# Patient Record
Sex: Female | Born: 1940
Health system: Southern US, Community
[De-identification: ages and names within clinical notes are randomized; demographics above are authoritative.]

## PROBLEM LIST (undated history)

## (undated) DIAGNOSIS — M8580 Other specified disorders of bone density and structure, unspecified site: Secondary | ICD-10-CM

## (undated) DIAGNOSIS — I83893 Varicose veins of bilateral lower extremities with other complications: Secondary | ICD-10-CM

## (undated) DIAGNOSIS — F419 Anxiety disorder, unspecified: Secondary | ICD-10-CM

## (undated) DIAGNOSIS — I2699 Other pulmonary embolism without acute cor pulmonale: Secondary | ICD-10-CM

## (undated) DIAGNOSIS — M47817 Spondylosis without myelopathy or radiculopathy, lumbosacral region: Secondary | ICD-10-CM

## (undated) DIAGNOSIS — K219 Gastro-esophageal reflux disease without esophagitis: Secondary | ICD-10-CM

## (undated) DIAGNOSIS — I4891 Unspecified atrial fibrillation: Secondary | ICD-10-CM

## (undated) DIAGNOSIS — I340 Nonrheumatic mitral (valve) insufficiency: Secondary | ICD-10-CM

## (undated) DIAGNOSIS — M4310 Spondylolisthesis, site unspecified: Secondary | ICD-10-CM

## (undated) DIAGNOSIS — R600 Localized edema: Secondary | ICD-10-CM

## (undated) DIAGNOSIS — I471 Supraventricular tachycardia: Secondary | ICD-10-CM

## (undated) DIAGNOSIS — E559 Vitamin D deficiency, unspecified: Secondary | ICD-10-CM

## (undated) DIAGNOSIS — Z9889 Other specified postprocedural states: Secondary | ICD-10-CM

## (undated) DIAGNOSIS — I10 Essential (primary) hypertension: Secondary | ICD-10-CM

## (undated) DIAGNOSIS — I4719 Other supraventricular tachycardia: Secondary | ICD-10-CM

## (undated) DIAGNOSIS — Z8619 Personal history of other infectious and parasitic diseases: Secondary | ICD-10-CM

## (undated) DIAGNOSIS — R112 Nausea with vomiting, unspecified: Secondary | ICD-10-CM

## (undated) DIAGNOSIS — J8410 Pulmonary fibrosis, unspecified: Secondary | ICD-10-CM

## (undated) DIAGNOSIS — I499 Cardiac arrhythmia, unspecified: Secondary | ICD-10-CM

## (undated) DIAGNOSIS — E785 Hyperlipidemia, unspecified: Secondary | ICD-10-CM

## (undated) HISTORY — DX: Localized edema: R60.0

## (undated) HISTORY — DX: Hyperlipidemia, unspecified: E78.5

## (undated) HISTORY — DX: Personal history of other infectious and parasitic diseases: Z86.19

## (undated) HISTORY — DX: Other specified disorders of bone density and structure, unspecified site: M85.80

## (undated) HISTORY — DX: Vitamin D deficiency, unspecified: E55.9

## (undated) HISTORY — PX: DILATION AND CURETTAGE OF UTERUS: SHX78

## (undated) HISTORY — DX: Spondylolisthesis, site unspecified: M43.10

## (undated) HISTORY — DX: Gastro-esophageal reflux disease without esophagitis: K21.9

## (undated) HISTORY — DX: Supraventricular tachycardia: I47.1

## (undated) HISTORY — DX: Spondylosis without myelopathy or radiculopathy, lumbosacral region: M47.817

## (undated) HISTORY — PX: TOE SURGERY: SHX1073

## (undated) HISTORY — DX: Varicose veins of bilateral lower extremities with other complications: I83.893

## (undated) HISTORY — DX: Pulmonary fibrosis, unspecified: J84.10

## (undated) HISTORY — PX: BACK SURGERY: SHX140

## (undated) HISTORY — DX: Other supraventricular tachycardia: I47.19

## (undated) HISTORY — PX: TONSILLECTOMY: SUR1361

## (undated) HISTORY — DX: Essential (primary) hypertension: I10

---

## 1974-06-20 HISTORY — PX: TUBAL LIGATION: SHX77

## 1993-06-20 HISTORY — PX: NECK SURGERY: SHX720

## 1997-06-20 HISTORY — PX: COMBINED HYSTEROSCOPY DIAGNOSTIC / D&C: SUR297

## 1997-08-18 ENCOUNTER — Ambulatory Visit (HOSPITAL_COMMUNITY): Admission: RE | Admit: 1997-08-18 | Discharge: 1997-08-18 | Payer: Self-pay | Admitting: Obstetrics and Gynecology

## 1998-07-07 ENCOUNTER — Other Ambulatory Visit: Admission: RE | Admit: 1998-07-07 | Discharge: 1998-07-07 | Payer: Self-pay | Admitting: Obstetrics and Gynecology

## 1999-10-19 ENCOUNTER — Other Ambulatory Visit: Admission: RE | Admit: 1999-10-19 | Discharge: 1999-10-19 | Payer: Self-pay | Admitting: Obstetrics and Gynecology

## 2000-10-21 ENCOUNTER — Inpatient Hospital Stay (HOSPITAL_COMMUNITY): Admission: EM | Admit: 2000-10-21 | Discharge: 2000-10-22 | Payer: Self-pay | Admitting: Emergency Medicine

## 2000-10-21 ENCOUNTER — Encounter: Payer: Self-pay | Admitting: Emergency Medicine

## 2000-10-22 ENCOUNTER — Encounter: Payer: Self-pay | Admitting: *Deleted

## 2000-11-24 ENCOUNTER — Emergency Department (HOSPITAL_COMMUNITY): Admission: EM | Admit: 2000-11-24 | Discharge: 2000-11-24 | Payer: Self-pay | Admitting: *Deleted

## 2001-01-08 ENCOUNTER — Other Ambulatory Visit: Admission: RE | Admit: 2001-01-08 | Discharge: 2001-01-08 | Payer: Self-pay | Admitting: Obstetrics and Gynecology

## 2001-07-03 ENCOUNTER — Encounter: Payer: Self-pay | Admitting: Obstetrics and Gynecology

## 2001-07-03 ENCOUNTER — Ambulatory Visit (HOSPITAL_COMMUNITY): Admission: RE | Admit: 2001-07-03 | Discharge: 2001-07-03 | Payer: Self-pay | Admitting: Obstetrics and Gynecology

## 2001-09-24 ENCOUNTER — Encounter: Payer: Self-pay | Admitting: Surgery

## 2001-09-24 ENCOUNTER — Ambulatory Visit (HOSPITAL_COMMUNITY): Admission: RE | Admit: 2001-09-24 | Discharge: 2001-09-24 | Payer: Self-pay | Admitting: Surgery

## 2002-01-31 ENCOUNTER — Other Ambulatory Visit: Admission: RE | Admit: 2002-01-31 | Discharge: 2002-01-31 | Payer: Self-pay | Admitting: Obstetrics and Gynecology

## 2002-04-23 ENCOUNTER — Ambulatory Visit (HOSPITAL_COMMUNITY): Admission: RE | Admit: 2002-04-23 | Discharge: 2002-04-23 | Payer: Self-pay | Admitting: Surgery

## 2002-04-23 ENCOUNTER — Encounter: Payer: Self-pay | Admitting: Surgery

## 2002-06-20 DIAGNOSIS — J8410 Pulmonary fibrosis, unspecified: Secondary | ICD-10-CM

## 2002-06-20 HISTORY — DX: Pulmonary fibrosis, unspecified: J84.10

## 2003-02-20 ENCOUNTER — Other Ambulatory Visit: Admission: RE | Admit: 2003-02-20 | Discharge: 2003-02-20 | Payer: Self-pay | Admitting: Obstetrics and Gynecology

## 2003-02-21 ENCOUNTER — Ambulatory Visit (HOSPITAL_COMMUNITY): Admission: RE | Admit: 2003-02-21 | Discharge: 2003-02-21 | Payer: Self-pay | Admitting: Internal Medicine

## 2003-05-26 ENCOUNTER — Encounter: Admission: RE | Admit: 2003-05-26 | Discharge: 2003-05-26 | Payer: Self-pay | Admitting: Internal Medicine

## 2003-06-21 HISTORY — PX: COMBINED HYSTEROSCOPY DIAGNOSTIC / D&C: SUR297

## 2004-03-17 ENCOUNTER — Other Ambulatory Visit: Admission: RE | Admit: 2004-03-17 | Discharge: 2004-03-17 | Payer: Self-pay | Admitting: Obstetrics and Gynecology

## 2004-07-01 ENCOUNTER — Other Ambulatory Visit: Admission: RE | Admit: 2004-07-01 | Discharge: 2004-07-01 | Payer: Self-pay | Admitting: Obstetrics and Gynecology

## 2005-03-22 ENCOUNTER — Other Ambulatory Visit: Admission: RE | Admit: 2005-03-22 | Discharge: 2005-03-22 | Payer: Self-pay | Admitting: Obstetrics and Gynecology

## 2006-03-02 ENCOUNTER — Encounter: Admission: RE | Admit: 2006-03-02 | Discharge: 2006-03-02 | Payer: Self-pay | Admitting: Internal Medicine

## 2008-01-16 ENCOUNTER — Ambulatory Visit (HOSPITAL_COMMUNITY): Admission: RE | Admit: 2008-01-16 | Discharge: 2008-01-16 | Payer: Self-pay | Admitting: Obstetrics and Gynecology

## 2008-01-16 ENCOUNTER — Encounter (INDEPENDENT_AMBULATORY_CARE_PROVIDER_SITE_OTHER): Payer: Self-pay | Admitting: Obstetrics and Gynecology

## 2009-02-06 ENCOUNTER — Encounter: Admission: RE | Admit: 2009-02-06 | Discharge: 2009-02-06 | Payer: Self-pay | Admitting: Orthopaedic Surgery

## 2009-02-10 ENCOUNTER — Ambulatory Visit (HOSPITAL_BASED_OUTPATIENT_CLINIC_OR_DEPARTMENT_OTHER): Admission: RE | Admit: 2009-02-10 | Discharge: 2009-02-10 | Payer: Self-pay | Admitting: Orthopaedic Surgery

## 2010-09-20 ENCOUNTER — Other Ambulatory Visit: Payer: Self-pay | Admitting: Internal Medicine

## 2010-09-20 DIAGNOSIS — R1013 Epigastric pain: Secondary | ICD-10-CM

## 2010-09-21 ENCOUNTER — Ambulatory Visit
Admission: RE | Admit: 2010-09-21 | Discharge: 2010-09-21 | Disposition: A | Discharge status: Home / Self Care | Payer: Medicare Other | Source: Ambulatory Visit | Attending: Internal Medicine | Admitting: Internal Medicine

## 2010-09-21 DIAGNOSIS — R1013 Epigastric pain: Secondary | ICD-10-CM

## 2010-09-22 ENCOUNTER — Other Ambulatory Visit: Payer: Self-pay

## 2010-09-25 LAB — BASIC METABOLIC PANEL
BUN: 20 mg/dL (ref 6–23)
CO2: 29 mEq/L (ref 19–32)
Calcium: 9.2 mg/dL (ref 8.4–10.5)
Chloride: 106 mEq/L (ref 96–112)
Creatinine, Ser: 0.84 mg/dL (ref 0.4–1.2)
GFR calc Af Amer: >60 mL/min (ref >60)
GFR calc non Af Amer: >60 mL/min (ref >60)
Glucose, Bld: 89 mg/dL (ref 70–99)
Potassium: 4.7 mEq/L (ref 3.5–5.1)
Sodium: 138 mEq/L (ref 135–145)

## 2010-09-25 LAB — HEPATIC FUNCTION PANEL
ALT: 10 U/L (ref 0–35)
AST: 16 U/L (ref 0–37)
Albumin: 3.9 g/dL (ref 3.5–5.2)
Alkaline Phosphatase: 42 U/L (ref 39–117)
Bilirubin, Direct: 0.2 mg/dL (ref 0.0–0.3)
Indirect Bilirubin: 1 mg/dL — ABNORMAL HIGH (ref 0.3–0.9)
Total Bilirubin: 1.2 mg/dL (ref 0.3–1.2)
Total Protein: 6.9 g/dL (ref 6.0–8.3)

## 2010-09-25 LAB — POCT HEMOGLOBIN-HEMACUE: Hemoglobin: 14 g/dL (ref 12.0–15.0)
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
## 2010-11-02 NOTE — Op Note (Signed)
Christy [name], Christy [name]                ACCOUNT NO.:  [id number]   MEDICAL RECORD NO.:  [id number]          PATIENT TYPE:  AMB   LOCATION:  DSC                          FACILITY:  [facility]   PHYSICIAN:  Lubertha Basque. Dalldorf, M.D.DATE OF BIRTH:  Oct 20, 1940   DATE OF PROCEDURE:  02/10/2009  DATE OF DISCHARGE:                               OPERATIVE REPORT   PREOPERATIVE DIAGNOSIS:  Right first metatarsophalangeal degeneration.   POSTOPERATIVE DIAGNOSIS:  Right first metatarsophalangeal degeneration.   PROCEDURE:  Right first metatarsophalangeal replacement.   ANESTHESIA:  General.   ATTENDING SURGEON:  Lubertha Basque. Jerl Santos, MD   ASSISTANT:  Lindwood Qua, PA   INDICATIONS FOR PROCEDURE:  The patient is a 70 year old woman with a  many year history of a painful right great toe.  This has persisted  despite oral anti-inflammatories and shoe modifications and 3  intraarticular injections, the first of which gave her many months of  relief.  She has pain which limits her ability to walk in a normal  fashion and to wear appropriate shoes and she is offered a replacement.  Informed operative consent was obtained after discussion of possible  complications including reaction to anesthesia, infection, and failure  of implant.   SUMMARY, FINDINGS, AND PROCEDURE:  Under general anesthesia through a  dorsal incision, a first MTP replacement was done on the right foot.  She had advanced degenerative change.  We performed the replacement with  a size 4S [facility] implant.  Lindwood Qua [facility] assisted  throughout and was invaluable to the completion of the case in that he  helped retract and position while I performed the procedure.   DESCRIPTION OF PROCEDURE:  The patient was taken to the operating suite  where general anesthetic was applied without difficulty.  She was  positioned supine and prepped and draped in a normal sterile fashion.  After administration of IV Kefzol, the right leg was  elevated,  exsanguinated, and tourniquet inflated about the calf.  A dorsal  incision was made with dissection down to the capsule over the joint.  An incision was made in the capsule just medial to the EHL tendon.  The  MTP joint was exposed.  Some large osteophytes were removed with rongeur  and saw and bur.  We made a cut on the distal portion of the first  metatarsal and roughly neutral angulation.  I made a corresponding cut  at the base of the proximal phalanx.  We then revised our first cut into  slight valgus to give [place] appropriate alignment.  We then entered the  intramedullary canal with a bur on each bone and rasped appropriately to  size 4.  We placed a trial component that seemed to give [place] a nice  reduction with no overhang on either aspect of the joint.  The trial  component was removed followed by lavage of the wound and bones.  We  then placed a size 4S [facility] implant.  Again, this seemed to fit  very well and was not under undue tension.  The tourniquet was deflated  and the toe  became pink and warm immediately.  The wound was irrigated  followed by reapproximation of the capsule with Vicryl and skin with  nylon.  Some Marcaine was injected followed by dry gauze and loose Ace  wrap.  Estimated blood loss and intraoperative fluids as well as  accurate tourniquet time can be obtained from anesthesia records.   DISPOSITION:  The patient was extubated in the operating room and taken  to the recovery room in stable addition.  She was to go home same-day  and follow up in my the office closely.  I will contact her by phone  tonight.      Lubertha Basque Jerl Santos, M.D.  Electronically Signed     PGD/MEDQ  D:  02/10/2009  T:  02/11/2009  Job:  [id number]

## 2010-11-02 NOTE — Op Note (Signed)
Christy [name], Christy [name]                ACCOUNT NO.:  [id number]   MEDICAL RECORD NO.:  [id number]          PATIENT TYPE:  AMB   LOCATION:  SDC                           [facility]ILITY[facility]   PHYSICIAN:  Guy Sandifer. Henderson Cloud, M.D. DATE OF BIRTH:  1940/12/16   DATE OF PROCEDURE:  01/16/2008  DATE OF DISCHARGE:                               OPERATIVE REPORT   PREOPERATIVE DIAGNOSIS:  Postmenopausal bleeding.   POSTOPERATIVE DIAGNOSIS:  Postmenopausal bleeding.   PROCEDURE:  Hysteroscopy with resection of endometrial polyp,  dilatation, and curettage.   SURGEON:  Guy Sandifer. Henderson Cloud, MD   ANESTHESIA:  General with LMA.   SPECIMENS:  1. Endometrial polyp.  2. Endometrial curettings.  All to pathology.  I's and O's of sorbitol      distending media 80 mL deficit with some of that on the floor.   INDICATIONS AND CONSENT:  This patient is a 70 year old married white  female G2, P2 with postmenopausal bleeding.  Details dictated in history  and physical.  Hysteroscopy with resectoscope, dilatation, and curettage  has been discussed with the patient preoperatively.  Potential risks and  complications were discussed preoperatively including but limited to  infection, uterine perforation, organ damage, bleeding requiring  transfusion of blood products with possible HIV and hepatitis  acquisition, DVT, PE, pneumonia, laparotomy, and laparoscopy.  All  questions have been answered and consent is signed on the chart.   FINDINGS:  There is approximately 8-mm broad-based polypoid-type mass in  the center of the posterior upper endometrial canal.  Fallopian tube  ostia identified bilaterally and the remainder of the endometrium  appears atrophic without lesion.   PROCEDURE:  The patient was taken to the operating room where she was  identified, placed in a dorsal supine position and general anesthesia  was induced via LMA.  She was then placed in a dorsal lithotomy  position.  She was prepped, bladder  straight catheterized, and draped in  a sterile fashion.  Bivalve speculum was placed in the vagina and the  anterior cervical lip was injected with 1% plain Xylocaine.  It was then  grasped with a single-tooth tenaculum.  Paracervical block was placed in  a 2, 4, 5, 7, 8, and 10 o'clock positions with approximately 13 mL of  the same solution.  Cervix was gently progressively dilated to a 31  dilator.  A 5-mm resectoscope with a single right-angle wire loop was  then placed in the endocervical canal and advanced under direct  visualization using sorbitol  distending media.  The above finding was noted.  The polyp was resected  in a simple fashion.  Good hemostasis was maintained.  Specimen was  removed.  Sharp curettage was carried out for scant amount of tissue.  There was good hemostasis.  All counts were correct.  The patient was  awakened and taken to the recovery room in stable condition.      Guy Sandifer Henderson Cloud, M.D.  Electronically Signed     JET/MEDQ  D:  01/16/2008  T:  01/17/2008  Job:  [id number]

## 2010-11-05 NOTE — Op Note (Signed)
NAME:  Christy [name], WIGGLESWORTH                         ACCOUNT NO.:  [id number]   MEDICAL RECORD NO.:  [id number]                   PATIENT TYPE:  AMB   LOCATION:  DAY                                  FACILITY:  [facility]   PHYSICIAN:  Lionel December, M.D.                 DATE OF BIRTH:  1941-01-19   DATE OF PROCEDURE:  02/21/2003  DATE OF DISCHARGE:                                 OPERATIVE REPORT   PROCEDURE:  Total colonoscopy with polypectomy.   INDICATIONS FOR PROCEDURE:  [name] is a 70 year old Caucasian female who is  undergoing screening colonoscopy.  The procedure was reviewed with the  patient, and informed consent was obtained.   PREOPERATIVE MEDICATIONS:  Demerol 50 mg IV, Versed 5 mg IV.   FINDINGS:  The procedure was performed in the endoscopy suite.  The  patient's vital signs and O2 saturations were monitored during the procedure  and remained stable.  The patient was placed in the left lateral recumbent  position and rectal examination performed.  No abnormality noted on external  or digital exam.  The Olympus videoscope was placed into the rectum and  advanced into the region of the sigmoid colon and beyond.  The preparation  was excellent.  The scope was passed to the cecum which was identified by  the appendiceal orifice and ileocecal valve.  There was about a 12 to 13-mm  flat polyp next to the ileocecal valve, and there was a much smaller polyp  next to it.  The larger polyp was raised with a submucosal injection of  normal saline.  It was snared piecemeal.  The smaller polyp was also snared,  and both of these polyps were submitted in one container.  There was  injection of a small amount of saline into the mucosa.  The mucosa of the  rest of the colon was normal.  There was a 6-cm polyp at the rectum which  was snared and retrieved for histologic examination.  The scope was  retroflexed to examine the anorectal junction, and moderate-sized  hemorrhoids were noted below the  dentate line.  The endoscope was  straightened and withdrawn.  The patient tolerated the procedure well.   FINAL DIAGNOSES:  1. Examination performed to the cecum.  2. A 12 to 13-mm flat polyp snared from the cecum after raising with a     submucosal injection of normal saline.  Another smaller polyp right next     to it was also snared along with a rectal polyp that was snared.  3. Moderate-sized external hemorrhoids.    RECOMMENDATIONS:  1. Standard instructions given.  2. I will be contacting [name] with the biopsy results next week.  Lionel December, M.D.    NR/MEDQ  D:  02/21/2003  T:  02/21/2003  Job:  [id number]   cc:   Guy Sandifer. Arleta Creek, M.D.  [address]  [place]  [place]  [place] [address]  Fax: [phone]   Eula Listen, M.D.  [facility]
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
## 2010-11-05 NOTE — Discharge Summary (Signed)
[place]  Patient:    Christy [name], Christy [name]                      MRN: [id number] Adm. Date:  [id number] Disc. Date: [id number] Attending:  Daisey Must Dictator:   Brita Romp, P.A. CC:         Luciana Axe, M.D.  Daisey Must, M.D. [facility]   Discharge Summary  DISCHARGE DIAGNOSES: 1. Chest pain, status post negative GXT Cardiolite stress test. 2. History of paroxysmal atrial tachycardia. 3. Gastroesophageal reflux disease. 4. History of hepatitis C.  HOSPITAL COURSE:  Christy [name] is a 70 year old female with no history of coronary artery disease.  She presented to the emergency room complaining of some substernal chest pain, which she described as a gas-like pressure feeling without radiation, and not accompanied by shortness of breath, diaphoresis or nausea.  It started the prior Sunday after eating and had been intermittent since that time.  The patient also related a high level of stress in her life.  She was seen and admitted by Dr. Loraine Leriche Pulsipher.  He felt that her pain was atypical for anginal symptoms.  He had her admitted.  He ordered serial cardiac enzymes as well as a GXT Cardiolite rest-stress test the following morning.  He suspected possible GI etiology for the discomfort with a question of whether it was GERD or possibly gallbladder.  The following morning the patient was doing well; had some occasional episodes of chest discomfort, and no shortness of breath.  Serial cardiac enzymes at this time were negative for MI. Later that morning the patient underwent a GXT Cardiolite exam.  Nuclear imaging revealed an ejection fraction of 74% with no ischemia.  It was felt that she stable for discharge.  DISCHARGE MEDICATIONS: 1. Digoxin 0.25 mg q.d. 2. Inderal 20 mg q.d. 3. Prevacid 30 mg b.i.d. NOTE:  Patient was advised to double up her Prevacid    as described on discharge.  She was further to advise Dr. Jodelle Green if the  treatment was effective or note. 4. Enteric coated aspirin 325 mg q.d.  DISCHARGE INSTRUCTIONS:  Patient was advised to return to her normal level of activity.  She is to eat a low-fat diet.  She is to follow up with Dr. Jodelle Green within about two weeks for this appointment.  LABORATORY VALUES:  Sodium 141, potassium 4.2, chloride 105, CO2 32,  BUN 26, creatinine 1.3, and glucose 90.  TSH 1.639.  White count 5.9, hemoglobin 12.9, hematocrit 37.5 and platelets 229,000.  Lipid panel is pending at the time of discharge and will be dictated as an addendum.  Chest x-ray revealed hyperaeration consistent with chronic obstructive pulmonary disease.  There were no active infiltrates or effusion.  There was some nodularity in the right lung apex, which may represent focal pleural thickening, but comparison with a prior chest x-ray was suggested; if not available, CT of the chest was recommended.  Heart size was normal and a prior anterior cervical fusion plate was noted overlying the lower cervical spine.  Electrocardiogram revealed normal sinus rhythm with some nonspecific ST wave abnormalities.  Ventricular rate 67, PR interval 0.186, QRS 0.086, OTC 0.414, and axis 46. DD:  10/22/00 TD:  10/24/00 Job: [id number]

## 2010-12-31 ENCOUNTER — Other Ambulatory Visit: Payer: Self-pay | Admitting: Dermatology

## 2011-03-18 LAB — COMPREHENSIVE METABOLIC PANEL
ALT: 16
AST: 19
Albumin: 4.5
Alkaline Phosphatase: 39
BUN: 13
CO2: 28
Calcium: 9.5
Chloride: 103
Creatinine, Ser: 0.72
GFR calc Af Amer: >60
GFR calc non Af Amer: >60
Glucose, Bld: 96
Potassium: 3.8
Sodium: 136
Total Bilirubin: 1.1
Total Protein: 7.6

## 2011-03-18 LAB — CBC
HCT: 41.3
Hemoglobin: 14
MCHC: 33.9
MCV: 97.4
Platelets: 245
RBC: 4.24
RDW: 12.8
WBC: 6.3

## 2011-03-21 ENCOUNTER — Other Ambulatory Visit: Payer: Self-pay | Admitting: Obstetrics and Gynecology

## 2012-03-21 ENCOUNTER — Other Ambulatory Visit: Payer: Self-pay | Admitting: Internal Medicine

## 2012-03-21 DIAGNOSIS — M549 Dorsalgia, unspecified: Secondary | ICD-10-CM

## 2012-03-23 ENCOUNTER — Ambulatory Visit
Admission: RE | Admit: 2012-03-23 | Discharge: 2012-03-23 | Disposition: A | Discharge status: Home / Self Care | Payer: Medicare Other | Source: Ambulatory Visit | Attending: Internal Medicine | Admitting: Internal Medicine

## 2012-03-23 DIAGNOSIS — M549 Dorsalgia, unspecified: Secondary | ICD-10-CM

## 2012-04-26 ENCOUNTER — Other Ambulatory Visit: Payer: Self-pay | Admitting: Neurosurgery

## 2012-04-26 ENCOUNTER — Ambulatory Visit
Admission: RE | Admit: 2012-04-26 | Discharge: 2012-04-26 | Disposition: A | Discharge status: Home / Self Care | Payer: Medicare Other | Source: Ambulatory Visit | Attending: Neurosurgery | Admitting: Neurosurgery

## 2012-04-26 DIAGNOSIS — M5410 Radiculopathy, site unspecified: Secondary | ICD-10-CM

## 2012-04-26 DIAGNOSIS — M549 Dorsalgia, unspecified: Secondary | ICD-10-CM

## 2012-04-26 MED ORDER — METHYLPREDNISOLONE ACETATE 40 MG/ML INJ SUSP (RADIOLOG
120.0000 mg | Freq: Once | INTRAMUSCULAR | Status: AC
  Administered 2012-04-26: 120 mg via EPIDURAL

## 2012-04-26 MED ORDER — IOHEXOL 180 MG/ML  SOLN
1.0000 mL | Freq: Once | INTRAMUSCULAR | Status: AC | PRN
  Administered 2012-04-26: 1 mL via EPIDURAL

## 2012-05-11 ENCOUNTER — Other Ambulatory Visit (INDEPENDENT_AMBULATORY_CARE_PROVIDER_SITE_OTHER): Payer: Self-pay | Admitting: General Surgery

## 2012-05-11 ENCOUNTER — Other Ambulatory Visit: Payer: Self-pay | Admitting: Neurosurgery

## 2012-05-11 DIAGNOSIS — M549 Dorsalgia, unspecified: Secondary | ICD-10-CM

## 2012-05-22 ENCOUNTER — Ambulatory Visit
Admission: RE | Admit: 2012-05-22 | Discharge: 2012-05-22 | Disposition: A | Discharge status: Home / Self Care | Payer: Medicare Other | Source: Ambulatory Visit | Attending: Neurosurgery | Admitting: Neurosurgery

## 2012-05-22 DIAGNOSIS — M549 Dorsalgia, unspecified: Secondary | ICD-10-CM

## 2012-05-22 MED ORDER — METHYLPREDNISOLONE ACETATE 40 MG/ML INJ SUSP (RADIOLOG
120.0000 mg | Freq: Once | INTRAMUSCULAR | Status: AC
  Administered 2012-05-22: 120 mg via EPIDURAL

## 2012-05-22 MED ORDER — IOHEXOL 180 MG/ML  SOLN
1.0000 mL | Freq: Once | INTRAMUSCULAR | Status: AC | PRN
  Administered 2012-05-22: 1 mL via EPIDURAL

## 2013-03-19 ENCOUNTER — Other Ambulatory Visit: Payer: Self-pay | Admitting: Dermatology

## 2013-04-22 ENCOUNTER — Other Ambulatory Visit: Payer: Self-pay | Admitting: Internal Medicine

## 2013-04-22 DIAGNOSIS — R599 Enlarged lymph nodes, unspecified: Secondary | ICD-10-CM

## 2013-04-24 ENCOUNTER — Ambulatory Visit
Admission: RE | Admit: 2013-04-24 | Discharge: 2013-04-24 | Disposition: A | Discharge status: Home / Self Care | Payer: Medicare Other | Source: Ambulatory Visit | Attending: Internal Medicine | Admitting: Internal Medicine

## 2013-04-24 DIAGNOSIS — R599 Enlarged lymph nodes, unspecified: Secondary | ICD-10-CM

## 2013-04-24 MED ORDER — IOHEXOL 300 MG/ML  SOLN
100.0000 mL | Freq: Once | INTRAMUSCULAR | Status: AC | PRN
  Administered 2013-04-24: 100 mL via INTRAVENOUS

## 2013-05-02 ENCOUNTER — Encounter (INDEPENDENT_AMBULATORY_CARE_PROVIDER_SITE_OTHER): Payer: Self-pay | Admitting: General Surgery

## 2013-05-02 ENCOUNTER — Ambulatory Visit (INDEPENDENT_AMBULATORY_CARE_PROVIDER_SITE_OTHER): Payer: Medicare Other | Admitting: General Surgery

## 2013-05-02 VITALS — BP 128/80 | HR 64 | Temp 98.3°F | Resp 14 | Ht 67.5 in | Wt 145.0 lb

## 2013-05-02 DIAGNOSIS — R599 Enlarged lymph nodes, unspecified: Secondary | ICD-10-CM

## 2013-05-02 DIAGNOSIS — R590 Localized enlarged lymph nodes: Secondary | ICD-10-CM

## 2013-05-02 NOTE — Progress Notes (Signed)
Patient ID: Christy [name], female   DOB: [birth year], 72 y.o.   MRN: [id number]  Chief Complaint  Patient presents with  . New Evaluation    eval rt ing lymph node enlargement    HPI Christy [name] is a 72 y.o. female.  She is referred by Dr. Lewayne Bunting for evaluation and biopsy of a right groin lymph node.  The patient noticed a slightly tender lump in her right groin on October 25. She saw Dr. Rene Paci on October 27. She was started on Bactrim. Lab work looked normal. UTI was diagnosed. The lump has gotten a little bit smaller but has not resolved. She has no history of rectal or vaginal problems. She gets a pelvic exam yearly with Dr. Huntley Dec. She has no history of skin cancer of the lower extremities other than a basal cell on the contralateral left leg. She has a small chronic cut at the base of the right second that this has never been infected.  A CT scan was performed and this shows no intra-abdominal pathology. There was mildly enlarged right inguinal lymph nodes measuring up to 17 mm with minimal infiltrative changes. Stable noncalcified left lower lobe nodule since 2004 indicating benign etiology.  HPI  History reviewed. No pertinent past medical history.  Past Surgical History  Procedure Laterality Date  . Neck surgery    . Toe surgery      replaced joint    History reviewed. No pertinent family history.  Social History History  Substance Use Topics  . Smoking status: Never Smoker   . Smokeless tobacco: Never Used  . Alcohol Use: Yes     Comment: wine daily    Allergies  Allergen Reactions  . Epinephrine Palpitations    Current Outpatient Prescriptions  Medication Sig Dispense Refill  . propranolol (INDERAL) 20 MG tablet Take 20 mg by mouth 3 (three) times daily.      . COMBIPATCH 0.05-0.25 MG/DAY       . DIGOX 250 MCG tablet       . pantoprazole (PROTONIX) 40 MG tablet        No current facility-administered medications for this visit.    Review of  Systems Review of Systems  Constitutional: Negative for fever, chills and unexpected weight change.  HENT: Negative for congestion, hearing loss, sore throat, trouble swallowing and voice change.   Eyes: Negative for visual disturbance.  Respiratory: Negative for cough and wheezing.   Cardiovascular: Negative for chest pain, palpitations and leg swelling.  Gastrointestinal: Negative for nausea, vomiting, abdominal pain, diarrhea, constipation, blood in stool, abdominal distention and anal bleeding.  Genitourinary: Negative for hematuria, vaginal bleeding and difficulty urinating.  Musculoskeletal: Negative for arthralgias.  Skin: Negative for rash and wound.  Neurological: Negative for seizures, syncope and headaches.  Hematological: Negative for adenopathy. Does not bruise/bleed easily.  Psychiatric/Behavioral: Negative for confusion.    Blood pressure 128/80, pulse 64, temperature 98.3 F (36.8 C), temperature source Temporal, resp. rate 14, height 5' 7.5" (1.715 m), weight 145 lb (65.772 kg).  Physical Exam Physical Exam  Constitutional: She is oriented to person, place, and time. She appears well-developed and well-nourished. No distress.  HENT:  Head: Normocephalic and atraumatic.  Nose: Nose normal.  Mouth/Throat: No oropharyngeal exudate.  Eyes: Conjunctivae and EOM are normal. Pupils are equal, round, and reactive to light. Left eye exhibits no discharge. No scleral icterus.  Neck: Neck supple. No JVD present. No tracheal deviation present. No thyromegaly present.  Cardiovascular: Normal rate,  regular rhythm, normal heart sounds and intact distal pulses.   No murmur heard. Pulmonary/Chest: Effort normal and breath sounds normal. No respiratory distress. She has no wheezes. She has no rales. She exhibits no tenderness.  Abdominal: Soft. Bowel sounds are normal. She exhibits no distension and no mass. There is no tenderness. There is no rebound and no guarding.  Genitourinary:   2 cm right femoral mass, somewhat mobile, irregular, nontender, no skin change. This is consistent with a right femoral triangle lymph node. No inguinal mass. Left inguinal and left femoral areas feel normal.  Musculoskeletal: She exhibits no edema and no tenderness.  Tiny partial thickness cut on plantar surface of right second toe. This is not infected or draining. Probably benign trauma from shoes.  Lymphadenopathy:    She has no cervical adenopathy.  Neurological: She is alert and oriented to person, place, and time. She exhibits normal muscle tone. Coordination normal.  Skin: Skin is warm. No rash noted. She is not diaphoretic. No erythema. No pallor.  Psychiatric: She has a normal mood and affect. Her behavior is normal. Judgment and thought content normal.    Data Reviewed Dr. Paulita Fujita office notes. CT scan of abdomen and pelvis  Assessment    Persistent right femoral adenopathy. This could be benign, or less likely malignant. Because of its persistence and texture, I have advised elective excision for histologic evaluation. She agrees  History atrial tachycardia  GERD  Hepatitis C in remission  Degenerative disc disease, status post epidural injections  Hyperlipidemia     Plan    Scheduled for excision of right femoral lymph node under monitored sedation as outpatient in the near future  I discussed the indications, details, techniques, and numerous risks of the surgery with her. She's where the risk of bleeding, infection, nerve damage with chronic pain, lymphocele, healing problems, and other unforeseen problems. She understands these issues. All her questions are answered. She agrees with this plan.        Angelia Mould. Derrell Lolling, M.D., [facility] General and Minimally invasive Surgery Breast and Colorectal Surgery Office:   [phone] Pager:   [phone]  05/02/2013, 9:30 AM

## 2013-05-02 NOTE — Patient Instructions (Signed)
You have a persistent right femoral lymph node. The cause of this is unknown.  Because it has persisted and seems relatively firm, it is advisable to have this excised and examined by a pathologist.  You will be scheduled for excision of the right femoral lymph node under sedation at the outpatient surgical Center in the near future.

## 2013-05-20 ENCOUNTER — Other Ambulatory Visit (INDEPENDENT_AMBULATORY_CARE_PROVIDER_SITE_OTHER): Payer: Self-pay | Admitting: General Surgery

## 2013-05-20 ENCOUNTER — Other Ambulatory Visit (INDEPENDENT_AMBULATORY_CARE_PROVIDER_SITE_OTHER): Payer: Self-pay | Admitting: *Deleted

## 2013-05-20 DIAGNOSIS — M793 Panniculitis, unspecified: Secondary | ICD-10-CM

## 2013-05-20 MED ORDER — HYDROCODONE-ACETAMINOPHEN 5-300 MG PO TABS: 1.0000 | ORAL_TABLET | ORAL | Status: DC | PRN

## 2013-05-20 MED ORDER — DOXYCYCLINE HYCLATE 50 MG PO CAPS: 50.0000 mg | ORAL_CAPSULE | Freq: Two times a day (BID) | ORAL | Status: DC

## 2013-05-22 ENCOUNTER — Ambulatory Visit (INDEPENDENT_AMBULATORY_CARE_PROVIDER_SITE_OTHER): Payer: Medicare Other | Admitting: General Surgery

## 2013-05-22 ENCOUNTER — Encounter (INDEPENDENT_AMBULATORY_CARE_PROVIDER_SITE_OTHER): Payer: Self-pay | Admitting: General Surgery

## 2013-05-22 VITALS — BP 122/68 | HR 65 | Temp 98.0°F | Resp 18 | Ht 67.5 in | Wt 147.0 lb

## 2013-05-22 DIAGNOSIS — R599 Enlarged lymph nodes, unspecified: Secondary | ICD-10-CM

## 2013-05-22 DIAGNOSIS — R590 Localized enlarged lymph nodes: Secondary | ICD-10-CM

## 2013-05-22 NOTE — Progress Notes (Signed)
Patient ID: Christy [name], female   DOB: Mar 25, 1941, 72 y.o.   MRN: [id number] History: This patient underwent excision of an infected right femoral triangle lymph node on 05/20/2013. Initially this was not infected but when  she showed up for surgery it was indurated and erythematous. There was purulent fluid which was cultured. The lymph node was conservatively excised. The wound was closed loosely with iodoform gauze drainage. She feels much better. She has no pain. The pathology report and the culture report are still pending  Exam: Patient looks good. Her husband is with her Right femoral incision looks good. Small suture on either end. The wound left open centrally. I remove the iodoform packing. The drainage is serous. There is no odor. The erythema has resolved. I did not repack the wound. Dry gauze bandage  Assessment: Abscessed right femoral adenopathy. Doing well 2 days postop excision and drainage  Plan: Check pathology and cultures Wound care discussed Return to see me one week and sutures will be removed.   Angelia Mould. Derrell Lolling, M.D., [facility] General and Minimally invasive Surgery Breast and Colorectal Surgery Office:   [phone] Pager:   [phone]

## 2013-05-22 NOTE — Patient Instructions (Signed)
Your groin wound looks much better. The redness is gone and the infection is coming under control. I removed the gauze packing today.  Change the gauze bandage 2 or 3 times a day, depending on the amount of drainage.  We will call you as soon as we get the culture report and the pathology report  Return to see Dr. Derrell Lolling in approximately one week

## 2013-05-23 ENCOUNTER — Telehealth (INDEPENDENT_AMBULATORY_CARE_PROVIDER_SITE_OTHER): Payer: Self-pay

## 2013-05-23 NOTE — Telephone Encounter (Signed)
I called and scheduled the pt to come in next week Friday at 3 pm.  I also notified her of the preliminary results below from Dr Derrell Lolling.

## 2013-05-23 NOTE — Telephone Encounter (Signed)
Message copied by Ivory Broad on Thu May 23, 2013  1:33 PM ------      Message from: Ernestene Mention      Created: Thu May 23, 2013 12:06 PM       Yes. I will see her at that time.            Please call Ms. Venuti and tell her that her preliminary pathology looks like an infection and no cancer. This is a verbal report. They are doing more cultures and more stains before they finalize the report. They're going to send the pathology over to [facility] and we should have a report earlier next week.            Angelia Mould. Derrell Lolling, M.D., [facility]      General and Minimally invasive Surgery      Breast and Colorectal Surgery      Office:   [phone]      Pager:   [phone]            ----- Message -----         From: Ivory Broad, RN         Sent: 05/23/2013  10:33 AM           To: Ernestene Mention, MD            You wanted to see her next week.  Can I bring her in Friday at 3pm?                  Huntley Dec       ------

## 2013-05-28 ENCOUNTER — Telehealth (INDEPENDENT_AMBULATORY_CARE_PROVIDER_SITE_OTHER): Payer: Self-pay | Admitting: General Surgery

## 2013-05-28 NOTE — Telephone Encounter (Signed)
Pt 's husband called for clarification of problem with dentist.  Pt is currently postop and taking Doxycycline since 05/20/13.  Her dentist "requires" she take 4 Keflex just before any procedure and, although they are aware she is taking Doxy now, still want her to take the Keflex on top of it.  Recommended the appt with dentist be rescheduled at a later date.  Husband fully agrees with this, especially since this is only for a teeth cleaning appt.

## 2013-05-31 ENCOUNTER — Encounter (HOSPITAL_COMMUNITY): Payer: Self-pay

## 2013-05-31 ENCOUNTER — Encounter (INDEPENDENT_AMBULATORY_CARE_PROVIDER_SITE_OTHER): Payer: Self-pay | Admitting: General Surgery

## 2013-05-31 ENCOUNTER — Ambulatory Visit (INDEPENDENT_AMBULATORY_CARE_PROVIDER_SITE_OTHER): Payer: Medicare Other | Admitting: General Surgery

## 2013-05-31 VITALS — BP 142/80 | HR 60 | Temp 97.0°F | Resp 18 | Ht 67.5 in | Wt 146.5 lb

## 2013-05-31 DIAGNOSIS — R590 Localized enlarged lymph nodes: Secondary | ICD-10-CM

## 2013-05-31 DIAGNOSIS — R599 Enlarged lymph nodes, unspecified: Secondary | ICD-10-CM

## 2013-05-31 NOTE — Patient Instructions (Signed)
Your right thigh wound is healing very nicely. There is no more infection. We removed the sutures today.  You may shower. Simply cover the open wound with a dry gauze bandage or a Band-Aid. This should heal completely in another 2 weeks or so.  We discussed the pathology report. There was no cancer. This looks like a chronic infection.  I advise you to see a podiatrist to try to get the small crack between your toes healed. This may or may not have contributed to this.  Return to see Dr. Derrell Lolling in 3 months to make sure that the enlarged lymph nodes have resolved.

## 2013-05-31 NOTE — Progress Notes (Signed)
Patient ID: Christy [name], female   DOB: 1940-06-27, 72 y.o.   MRN: [id number]  History: This patient underwent excision of an infected, abscessed right femoral triangle mass on 05/20/2013. There was purulent fluid and this was cultured but we do not have the report on that yet. The final pathology report with a long time, was sent to [facility] for second opinion. Ultimately they concluded that this was benign suppurative and granulomatous panniculitis. No lymphatic tissue was seen. This was felt to be consistent with cat scratch disease, Kikuchi's disease, or other infectious etiologies.  I discussed this with the patient and gave her a copy of the pathology report  Exam: Patient looks good. No distress.  right femoral incision looks very good. Sutures removed on either end. No purulence. Slight open wound centrally healing by secondary intention. No more erythema. Redressed.  Assessment:  Suppurative and granulomatous panniculitis, right femoral triangle. Suspect infectious etiology. Resolving following antibiotics and drainage and biopsy No indication of malignancy  Plan: Wound care discussed. She may shower but no tub baths until January Return to see me in 3 months to make sure that the right inguinal mass has resolved.    Angelia Mould. Derrell Lolling, M.D., [facility] General and Minimally invasive Surgery Breast and Colorectal Surgery Office:   [phone] Pager:   [phone]

## 2013-06-06 ENCOUNTER — Encounter (INDEPENDENT_AMBULATORY_CARE_PROVIDER_SITE_OTHER): Payer: Self-pay

## 2013-06-07 ENCOUNTER — Encounter (INDEPENDENT_AMBULATORY_CARE_PROVIDER_SITE_OTHER): Payer: Self-pay | Admitting: General Surgery

## 2013-06-07 ENCOUNTER — Ambulatory Visit (INDEPENDENT_AMBULATORY_CARE_PROVIDER_SITE_OTHER): Payer: Medicare Other | Admitting: General Surgery

## 2013-06-07 ENCOUNTER — Other Ambulatory Visit: Payer: Self-pay | Admitting: Dermatology

## 2013-06-07 VITALS — BP 160/92 | HR 64 | Temp 98.5°F | Resp 14 | Ht 67.5 in | Wt 148.6 lb

## 2013-06-07 DIAGNOSIS — Z09 Encounter for follow-up examination after completed treatment for conditions other than malignant neoplasm: Secondary | ICD-10-CM

## 2013-06-07 NOTE — Patient Instructions (Signed)
The wound looks fine. You have skin irritation from the tape. Try to minimize tape on the skin.

## 2013-06-08 NOTE — Progress Notes (Signed)
Subjective:     Patient ID: Christy [name], female   DOB: Feb 09, 1941, 72 y.o.   MRN: [id number]  HPI  72 year old Caucasian female status post excision of a right femoral subcutaneous mass On December 1 comes in for wound check. She is getting ready to leave town for several months and was concerned that her wound may be infected. She denies any fevers or chills. She has been covering the area with gauze and tape. Review of Systems     Objective:   Physical Exam BP 160/92  Pulse 64  Temp(Src) 98.5 F (36.9 C) (Temporal)  Resp 14  Ht 5' 7.5" (1.715 m)  Wt 148 lb 9.6 oz (67.405 kg)  BMI 22.92 kg/m2 Alert, no apparent distress Right groin-demonstrates an almost healed transverse incision. There is a small amount of skin separation in the central portion of the incision. However there is no cellulitis, induration or fluctuance. She does have  skin irritation on the periphery of the incision from tape    Assessment:     Status post excision of right subcutaneous mass Skin irritation     Plan:     I assured her that the wound looked fine. There is no sign of infection. I did advise her to limit the amount of tape she is using due to skin irritation. F/u as scheduled with Dr Lambert Keto. Andrey Campanile, MD, FACS General, Bariatric, & Minimally Invasive Surgery [facility], [place]

## 2013-09-12 ENCOUNTER — Ambulatory Visit (INDEPENDENT_AMBULATORY_CARE_PROVIDER_SITE_OTHER): Payer: Medicare Other | Admitting: General Surgery

## 2013-09-12 ENCOUNTER — Encounter (INDEPENDENT_AMBULATORY_CARE_PROVIDER_SITE_OTHER): Payer: Self-pay | Admitting: General Surgery

## 2013-09-12 VITALS — BP 118/78 | HR 76 | Temp 97.8°F | Resp 16 | Ht 67.0 in | Wt 146.0 lb

## 2013-09-12 DIAGNOSIS — R590 Localized enlarged lymph nodes: Secondary | ICD-10-CM

## 2013-09-12 DIAGNOSIS — R599 Enlarged lymph nodes, unspecified: Secondary | ICD-10-CM

## 2013-09-12 NOTE — Patient Instructions (Signed)
Your right femoral wound has completely healed. There is no evidence of inguinal or femoral adenopathy. Nothing further needs to be done. This was a low risk finding.  Return to see Dr. Dalbert Batman if necessary.

## 2013-09-12 NOTE — Progress Notes (Signed)
Patient ID: Christy [name], female   DOB: 1940/08/22, 73 y.o.   MRN: [id number] History:  This patient underwent excision of an infected, abscessed right femoral triangle mass on 05/20/2013. There was purulent fluid and this was cultured But the culture was never reported out. The final pathology  was sent to [facility] for second opinion. Ultimately they concluded that this was benign suppurative and granulomatous panniculitis. No lymphatic tissue was seen. This was felt to be consistent with cat scratch disease, Kikuchi's disease, or other infectious etiologies. I discussed this with the patient and gave her a copy of the pathology report  She returns today for long-term followup. She states she has no pain and no lump and feels fine.  Exam:  Patient looks good. No distress. right femoral incision looks very good.  Inguinal and femoral tissues are soft. There is no tenderness. There is no enlarged lymph nodes.   Assessment:  Suppurative and granulomatous panniculitis, right femoral triangle. Suspect infectious etiology. Resolved following antibiotics, excision and drainage  No indication of malignancy   Plan:  Reassurance. Return if necessary   [facility]. Dalbert Batman, M.D., [facility] General and Minimally invasive Surgery Breast and Colorectal Surgery Office:   [phone] Pager:   [phone]

## 2014-10-15 ENCOUNTER — Other Ambulatory Visit: Payer: Self-pay | Admitting: Dermatology

## 2015-03-03 ENCOUNTER — Ambulatory Visit (INDEPENDENT_AMBULATORY_CARE_PROVIDER_SITE_OTHER): Payer: PPO | Admitting: Cardiology

## 2015-03-03 ENCOUNTER — Encounter: Payer: Self-pay | Admitting: Cardiology

## 2015-03-03 VITALS — BP 160/98 | HR 50 | Ht 67.0 in

## 2015-03-03 DIAGNOSIS — I10 Essential (primary) hypertension: Secondary | ICD-10-CM

## 2015-03-03 DIAGNOSIS — R001 Bradycardia, unspecified: Secondary | ICD-10-CM | POA: Diagnosis not present

## 2015-03-03 DIAGNOSIS — I471 Supraventricular tachycardia: Secondary | ICD-10-CM | POA: Diagnosis not present

## 2015-03-03 NOTE — Progress Notes (Signed)
PATIENT: Christy [name] MRN: [id number] DOB: June 17, 1941 PCP: Wenda Low, MD  Clinic Note: Chief Complaint  Patient presents with  . New Evaluation    no chest pain, SOB or leg swelling  . Bradycardia    HPI: Christy [name] is a 74 y.o. female with a PMH below who presents today for cardiology evaluation for Bradycardia -- has long standing h/o PAT (dating back to 86) -- no syncope associated.  Never went to ER. She has been on accommodation of beta blocker and Johnson for years.  Interval History: She was in her usual state of health until last Tuesday (September 6) she was [place] the beach with [place] and had an episode of feeling tired and short of breath. She noted feeling sleepy and very fatigued. She checked her heart rate and noticed that her pulse was in the 40s and her blood pressure has been higher.  She brings in her recordings of blood pressures that ranged from systolic pressures in the 160s to 180s. Her heart rates have gradually picked up now mostly in the 50s and 60s. She stopped taking her digoxin at that time, and went to go see her PCP on September 8. He agreed with stopping the [place], but continue Inderal. He checked a digoxin level. An EKG there showed PVCs and first-degree AV block.  Prior to this episode, she really has had no issues with her heart rate.  She has very rare bursts of PAT, and cannot recall the last time that she has had a prolonged episode.  These episodes can happen at any given time, but usually happen at nighttime fossae sleeping. They wake her up.  Cardiovascular ROS: no chest pain or dyspnea on exertion positive for - palpitations, rapid heart rate and Lightheadedness, fatigue, borderline near-syncope with the initial episode of bradycardia;; chronically has mild swelling in the lower extremities. Also has mild varicose/spider veins negative for - loss of consciousness, orthopnea or paroxysmal nocturnal dyspnea :  Past Medical History    Diagnosis Date  . Paroxysmal atrial tachycardia   . Dyslipidemia   . Bilateral lower extremity edema     Chronic, likely related to venous stasis   . Varicose veins of both legs with edema   . History of hepatitis C virus infection     In remission  . GERD (gastroesophageal reflux disease)   . Lung granuloma 2004     CT scan  . Osteopenia   . Vitamin D deficiency   . DJD (degenerative joint disease), lumbosacral   . Spondylolisthesis     Status post epidural injections 3 by neurosurgery  - Dr. Joya Salm     Prior Cardiac Evaluation and Past Surgical History: Past Surgical History  Procedure Laterality Date  . Neck surgery    . Toe surgery      replaced joint    Allergies  Allergen Reactions  . Epinephrine Palpitations    Current Outpatient Prescriptions  Medication Sig Dispense Refill  . estradiol-norethindrone (COMBIPATCH) 0.05-0.25 MG/DAY Place 1 patch onto the skin 2 (two) times a week.    . pantoprazole (PROTONIX) 40 MG tablet Take 40 mg by mouth daily.    . propranolol (INDERAL) 20 MG tablet Take 20 mg by mouth 3 (three) times daily.     No current facility-administered medications for this visit.   Social History   Social History  . Marital Status: Married    Spouse Name: N/A  . Number of Children: N/A  . Years of  Education: N/A   Social History Main Topics  . Smoking status: Never Smoker   . Smokeless tobacco: Never Used  . Alcohol Use: Yes     Comment: wine daily  . Drug Use: No  . Sexual Activity: Not Asked   Other Topics Concern  . None   Social History Narrative   She is a married mother of 1. Lives with her husband. 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 She has never smoked and drinks up to 10 glasses of wine or so week.   She usually exercises roughly 3 days a week doing yoga and plays golf. She is mostly limited by her "time constraints "    family history includes Heart Problems in her mother. - she is not sure of the  details  ROS: A comprehensive Review of Systems -  Review of Systems  Constitutional: Positive for malaise/fatigue (With onset of slow heart rate symptoms.).  HENT: Negative for nosebleeds.   Eyes: Negative for blurred vision.  Respiratory: Negative for cough and shortness of breath.   Cardiovascular: Positive for palpitations and leg swelling. Negative for claudication.  Gastrointestinal: Negative for blood in stool and melena.  Genitourinary: Negative for hematuria.  Neurological: Positive for dizziness (when bradycardic). Negative for headaches.       No TIA/Amaurosis Fugax Sx  Endo/Heme/Allergies: Does not bruise/bleed easily.  Psychiatric/Behavioral: The patient is nervous/anxious (very nervous & anxious about this change in her condition).   All other systems reviewed and are negative.   PHYSICAL EXAM BP 160/98 mmHg  Pulse 50  Ht 5\' 7"  (1.702 m) General appearance: alert, cooperative, appears stated age, no distress and well groomed, healthy; anxioux HEENT: [place]/AT, EOMI, MMM, anicteric sclera Neck: no adenopathy, no carotid bruit, no JVD, supple, symmetrical, trachea midline and thyroid not enlarged, symmetric, no tenderness/mass/nodules Lungs: clear to auscultation bilaterally, normal percussion bilaterally and non-labored, good air movement Heart: Bradycardic with normal S1 and S2. No M/R/G. Nondisplaced PMI. Abdomen: soft, non-tender; bowel sounds normal; no masses,  no organomegaly Extremities: Mild lower TIMI edema with small spider veins and varicose veins. No venous stasis changes. Pulses: 2+ and symmetric Skin: Skin color, texture, turgor normal. No rashes or lesions Neurologic: Alert and oriented X 3, normal strength and tone. Normal symmetric reflexes. Normal coordination and gait; CNII-XII grossly intact   Adult ECG Report - not checked  EKG from PCP reviewed Rate 68 bpm. Normal sinus rhythm with 1 AV block (PR interval 244) and PVCs (monomorphic).   Minimal R wave  in V1 and V2 suggestive of anterior infarct, age undetermined.  Otherwise borderline low voltage but normal EKG.  Recent Labs: n/a  ASSESSMENT / PLAN: Relatively stable patient with long-standing PAT - usually well controlled with BB & Digoxin (not followed by cardiology) who now presents following an episode of "symptomatic bradycardia".  He PCP has already stopped her Digoxin & her HR by home recordings has been steadily increasing.  Problem List Items Addressed This Visit    Bradycardia - Primary    Initially at heart rates down in the 40s by her pulse check. Currently now with heart rates 50 cubic 68 at her PCPs office. She does have some PVCs on her EKG.  I agree with holding digoxin. I don't know is what her heart rate is going to do now that the digoxin is out of her system.  Plan: Two-week event monitor to evaluate resting heart rate and chronotropic competence.3  Would continue current dose of  beta blocker for now based on the PVCs and history of PAT      Relevant Orders   Cardiac event monitor   Essential hypertension    Elevated blood pressures on exam both here and on home checks. Digoxin is on blood pressure medication, and she is only on Inderal. Would anticipate that some of this is related bradycardia mediated increased pulse pressure, however the rate is better controlled, we need to see what her blood pressure does over the next several weeks. We may need to add additional antihypertensives agent. The benefit of using a vasodilated would be that the potential side effect of compensatory tachycardia would be helpful.      PAT (paroxysmal atrial tachycardia) (Chronic)    No significant recurrences of PAT. Continue beta blocker for now. I agree with holding digoxin for bradycardia. We talked about using vagal maneuvers to break these episodes. I'm not sure that digoxin gives that much of a benefit for PAT, so hopefully there will be any changes.  2 week event monitor to  evaluate for recurrence of PAT as well as bradycardia.      Relevant Orders   Cardiac event monitor      Meds ordered this encounter  Medications  . estradiol-norethindrone (COMBIPATCH) 0.05-0.25 MG/DAY    Sig: Place 1 patch onto the skin 2 (two) times a week.  Marland Kitchen DISCONTD: digoxin (DIGOX) 0.25 MG tablet    Sig: Take 0.25 mg by mouth daily.  . pantoprazole (PROTONIX) 40 MG tablet    Sig: Take 40 mg by mouth daily.     Followup: 1 month - after event monitor  Maela Takeda W. Ellyn Hack, M.D., M.S. Interventional Cardiolgy [facility]

## 2015-03-03 NOTE — Patient Instructions (Addendum)
Your physician has recommended that you wear an event monitor 2 WEEKS. Event monitors are medical devices that record the heart's electrical activity. Doctors most often [place] these monitors to diagnose arrhythmias. Arrhythmias are problems with the speed or rhythm of the heartbeat. The monitor is a small, portable device. You can wear one while you do your normal daily activities. This is usually used to diagnose what is causing palpitations/syncope (passing out).  CONTINUE TO NOT TAKE DIGOXIN FOR THE PRESENT TIME.  DON'T TAKE BLOOD PRESSURE.  Your physician recommends that you schedule a follow-up appointment in [place]. - 30 MIN APPOINTMENT.  Cardiac Event Monitoring A cardiac event monitor is a small recording device used to help detect abnormal heart rhythms (arrhythmias). The monitor is used to record heart rhythm when noticeable symptoms such as the following occur:  Fast heartbeats (palpitations), such as heart racing or fluttering.  Dizziness.  Fainting or light-headedness.  Unexplained weakness. The monitor is wired to two electrodes placed on your chest. Electrodes are flat, sticky disks that attach to your skin. The monitor can be worn for up to 30 days. You will wear the monitor at all times, except when bathing.  HOW TO USE YOUR CARDIAC EVENT MONITOR A technician will prepare your chest for the electrode placement. The technician will show you how to place the electrodes, how to work the monitor, and how to replace the batteries. Take time to practice using the monitor before you leave the office. Make sure you understand how to send the information from the monitor to your health care provider. This requires a telephone with a landline, not a cell phone. You need to:  Wear your monitor at all times, except when you are in water:  Do not get the monitor wet.  Take the monitor off when bathing. Do not swim or use a hot tub with it on.  Keep your skin clean. Do  not put body lotion or moisturizer on your chest.  Change the electrodes daily or any time they stop sticking to your skin. You might need to use tape to keep them on.  It is possible that your skin under the electrodes could become irritated. To keep this from happening, try to put the electrodes in slightly different places on your chest. However, they must remain in the area under your left breast and in the upper right section of your chest.  Make sure the monitor is safely clipped to your clothing or in a location close to your body that your health care provider recommends.  Press the button to record when you feel symptoms of heart trouble, such as dizziness, weakness, light-headedness, palpitations, thumping, shortness of breath, unexplained weakness, or a fluttering or racing heart. The monitor is always on and records what happened slightly before you pressed the button, so do not worry about being too late to get good information.  Keep a diary of your activities, such as walking, doing chores, and taking medicine. It is especially important to note what you were doing when you pushed the button to record your symptoms. This will help your health care provider determine what might be contributing to your symptoms. The information stored in your monitor will be reviewed by your health care provider alongside your diary entries.  Send the recorded information as recommended by your health care provider. It is important to understand that it will take some time for your health care provider to process the results.  Change  the batteries as recommended by your health care provider. SEEK IMMEDIATE MEDICAL CARE IF:   You have chest pain.  You have extreme difficulty breathing or shortness of breath.  You develop a very fast heartbeat that persists.  You develop dizziness that does not go away.  You faint or constantly feel you are about to faint. Document Released: 03/15/2008 Document  Revised: 10/21/2013 Document Reviewed: 12/03/2012 [facility] Patient Information 2015 [place], [place]. This information is not intended to replace advice given to you by your health care provider. Make sure you discuss any questions you have with your health care provider.

## 2015-03-10 ENCOUNTER — Encounter: Payer: Self-pay | Admitting: Cardiology

## 2015-03-10 DIAGNOSIS — I10 Essential (primary) hypertension: Secondary | ICD-10-CM | POA: Insufficient documentation

## 2015-03-10 DIAGNOSIS — I471 Supraventricular tachycardia: Secondary | ICD-10-CM | POA: Insufficient documentation

## 2015-03-10 DIAGNOSIS — R001 Bradycardia, unspecified: Secondary | ICD-10-CM | POA: Insufficient documentation

## 2015-03-10 NOTE — Assessment & Plan Note (Signed)
Elevated blood pressures on exam both here and on home checks. Digoxin is on blood pressure medication, and she is only on Inderal. Would anticipate that some of this is related bradycardia mediated increased pulse pressure, however the rate is better controlled, we need to see what her blood pressure does over the next several weeks. We may need to add additional antihypertensives agent. The benefit of using a vasodilated would be that the potential side effect of compensatory tachycardia would be helpful.

## 2015-03-10 NOTE — Assessment & Plan Note (Addendum)
Initially at heart rates down in the 40s by her pulse check. Currently now with heart rates 50 cubic 68 at her PCPs office. She does have some PVCs on her EKG.  I agree with holding digoxin. I don't know is what her heart rate is going to do now that the digoxin is out of her system.  Plan: Two-week event monitor to evaluate resting heart rate and chronotropic competence.3  Would continue current dose of beta blocker for now based on the PVCs and history of PAT

## 2015-03-10 NOTE — Assessment & Plan Note (Addendum)
No significant recurrences of PAT. Continue beta blocker for now. I agree with holding digoxin for bradycardia. We talked about using vagal maneuvers to break these episodes. I'm not sure that digoxin gives that much of a benefit for PAT, so hopefully there will be any changes.  2 week event monitor to evaluate for recurrence of PAT as well as bradycardia.

## 2015-03-19 ENCOUNTER — Ambulatory Visit: Payer: Medicare Other | Admitting: Cardiology

## 2015-03-19 ENCOUNTER — Telehealth: Payer: Self-pay | Admitting: *Deleted

## 2015-03-19 NOTE — Telephone Encounter (Signed)
-----   Message from Leonie Man, MD sent at 03/18/2015  7:40 PM EDT -----     Mostly normal sinus rhythm with rate ranging from 60-80 bpm     Intermittent PVCs noted in singles, couplets as well as several runs of 4-5 beats (nonsustained VT)     All events were auto triggered insinuating no symptoms.     No evidence of bradycardia   --  Mostly normal study however there are significant amount of PVCs in couplets and quadruplets --> Apparently asymptomatic.  No evidence of PAT. Quite a few PVCs.  I wonder if the low heart rate she felt was potentially related to PVCs and bigeminy.  Would continue to hold the digoxin and continue beta blocker. At followup we can discuss whether or not she is having ischemic evaluation.  It would be beneficial to see how she does the treadmill, to see if PVCs get worse/more frequent.   Leonie Man, MD

## 2015-03-19 NOTE — Telephone Encounter (Signed)
Spoke to patient. Result given . Verbalized understanding She is aware to continue holding digoxin ,continue taking beta blocker Patient has follow up appointment with Dr Gillian Shields.

## 2015-03-31 ENCOUNTER — Ambulatory Visit (INDEPENDENT_AMBULATORY_CARE_PROVIDER_SITE_OTHER): Payer: PPO | Admitting: Cardiology

## 2015-03-31 ENCOUNTER — Encounter: Payer: Self-pay | Admitting: Cardiology

## 2015-03-31 VITALS — BP 148/86 | HR 61 | Ht 67.5 in | Wt 148.4 lb

## 2015-03-31 DIAGNOSIS — I10 Essential (primary) hypertension: Secondary | ICD-10-CM

## 2015-03-31 DIAGNOSIS — R9431 Abnormal electrocardiogram [ECG] [EKG]: Secondary | ICD-10-CM

## 2015-03-31 DIAGNOSIS — I471 Supraventricular tachycardia: Secondary | ICD-10-CM

## 2015-03-31 DIAGNOSIS — I4719 Other supraventricular tachycardia: Secondary | ICD-10-CM

## 2015-03-31 DIAGNOSIS — R001 Bradycardia, unspecified: Secondary | ICD-10-CM | POA: Diagnosis not present

## 2015-03-31 MED ORDER — PROPRANOLOL HCL 20 MG PO TABS: 20.0000 mg | ORAL_TABLET | Freq: Every day | ORAL | Status: DC

## 2015-03-31 NOTE — Patient Instructions (Signed)
Medications: The current medical regimen is effective;  continue present plan and medications.  Testing/Procedures: Your physician has requested that you have an echocardiogram. Echocardiography is a painless test that uses sound waves to create images of your heart. It provides your doctor with information about the size and shape of your heart and how well your heart's chambers and valves are working. This procedure takes approximately one hour. There are no restrictions for this procedure.  Follow-Up: Follow up in 6 months with Dr. Marlou Porch.  You will receive a letter in the mail 2 months before you are due.  Please call us when you receive this letter to schedule your follow up appointment.  Thank you for choosing [facility]!!

## 2015-03-31 NOTE — Progress Notes (Signed)
Cardiology Office Note   Date:  03/31/2015   ID:  Christy [name], Cancel Oct 21, 1940, MRN [id number]  PCP:  Wenda Low, MD  Cardiologist:   Candee Furbish, MD       History of Present Illness: Christy [name] is a 74 y.o. female Christy [name] husband my patient-enjoys Prince Solian) recently saw Dr. Ellyn Hack on 03/03/15 for the evaluation of bradycardia with long-standing paroxysmal atrial tachycardia dating back to 1971 with no associated syncope. Beta blockers for years.  On September 6 she was at the beach with her husband, had an episode of feeling tired, shortness of breath, sleepy, fatigued and checked her heart rate pulse was in the 40s. She is quite diligent about blood pressure measurements. Systolics were in the 008Q to 180s. Heart rates gradually increased to the 50s and 60s.  At that time she had stopped taking her digoxin. She saw Dr. Deforest Hoyles on September 8. Continue the beta blocker. An EKG performed then show first-degree AV block PVC.  Rare bursts of paroxysmal atrial tachycardia have been throughout her life. Cannot recall the last time she had a long episode. They can happen usually at nighttime while sleeping.  No syncope, no orthopnea, no PND. Occasional mild lower extremity edema. Varicose veins.  Since stopping the digoxin, she has been feeling okay. She does occasionally feel and expiratory upper airway wheeze. She wonders if she is developing late onset asthma.  She also showed me blood pressures that were excellent on her logbook. Sometimes she is worried about them being labile. Sometimes she becomes anxious when checking her blood pressures.    Past Medical History  Diagnosis Date  . Paroxysmal atrial tachycardia ([place])   . Dyslipidemia   . Bilateral lower extremity edema     Chronic, likely related to venous stasis   . Varicose veins of both legs with edema   . History of hepatitis C virus infection     In remission  . GERD (gastroesophageal reflux disease)   .  Lung granuloma ([place]) 2004     CT scan  . Osteopenia   . Vitamin D deficiency   . DJD (degenerative joint disease), lumbosacral   . Spondylolisthesis     Status post epidural injections 3 by neurosurgery  - Dr. Joya Salm     Past Surgical History  Procedure Laterality Date  . Neck surgery    . Toe surgery      replaced joint     Current Outpatient Prescriptions  Medication Sig Dispense Refill  . estradiol-norethindrone (COMBIPATCH) 0.05-0.25 MG/DAY Place 1 patch onto the skin 2 (two) times a week.    . pantoprazole (PROTONIX) 40 MG tablet Take 40 mg by mouth daily.    . propranolol (INDERAL) 20 MG tablet Take 1 tablet (20 mg total) by mouth daily. 90 tablet 3   No current facility-administered medications for this visit.    Allergies:   Epinephrine    Social History:  The patient  reports that she has never smoked. She has never used smokeless tobacco. She reports that she drinks alcohol. She reports that she does not use illicit drugs.   Family History:  The patient's family history includes Heart Problems in her mother.    ROS:  Please see the history of present illness.   Otherwise, review of systems are positive for none.   All other systems are reviewed and negative.    PHYSICAL EXAM: VS:  BP 148/86 mmHg  Pulse 61  Ht 5' 7.5" (1.715  m)  Wt 148 lb 6.4 oz (67.314 kg)  BMI 22.89 kg/m2 , BMI Body mass index is 22.89 kg/(m^2). GEN: Well nourished, well developed, in no acute distress HEENT: normal Neck: no JVD, carotid bruits, or masses Cardiac: RRR; no murmurs, rubs, or gallops,no edema  Respiratory:  clear to auscultation bilaterally, normal work of breathing GI: soft, nontender, nondistended, + BS MS: no deformity or atrophy Skin: warm and dry, no rash Neuro:  Strength and sensation are intact Psych: euthymic mood, full affect   EKG: Today 03/31/15-sinus rhythm, 62, first-degree AV block, PR interval 240 ms. Poor R-wave progression in V2 with no PVCs.. Prior EKG  heart rate 68 bpm, normal sinus rhythm, first-degree AV block with PR interval of 244 ms and monomorphic PVCs. Poor R-wave progression.   Recent Labs: No results found for requested labs within last 365 days.    Lipid Panel No results found for: CHOL, TRIG, HDL, CHOLHDL, VLDL, LDLCALC, LDLDIRECT    Wt Readings from Last 3 Encounters:  03/31/15 148 lb 6.4 oz (67.314 kg)  09/12/13 146 lb (66.225 kg)  06/07/13 148 lb 9.6 oz (67.405 kg)      Other studies Reviewed: Additional studies/ records that were reviewed today include: Prior office records reviewed, lab work, EKG. Review of the above records demonstrates: As above   ASSESSMENT AND PLAN:  1.  Bradycardia  - Heart rates from the 40s. Off of digoxin. Continued with Inderal. Last heart rate in the 50s and 60s. Rare PVCs. Event monitor was placed. Event monitor showed mostly sinus rhythm with heart rates ranging between 60 and 80. Intermittent PVCs were noted in singles and couplets, a few beats of nonsustained VT of 4-5 beats were also noted. Events were triggered. No symptoms. No evidence of significant bradycardia. I will check an echocardiogram to ensure proper structure and function of her heart. If there are any significant abnormality is, we will proceed with stress test. She has had a treadmill test in the past. She has not had any high risk symptoms such as syncope with exercise for instance.  2. Paroxysmal atrial tachycardia  - Rare episodes. Off of digoxin.  - Continue Inderal.  3. First-degree AV block  - Mild, watch with Inderal.  4. End expiratory occasional upper airways wheeze  - Could be related to increase sinus drip. Could be mucus production accumulating in the upper airways. Could try Mucinex. Albuterol would be reasonable if necessary however this may exacerbate tachycardia. Today she sounds clear.  5. Nonsustained ventricular tachycardia-rare episode seen on event monitor. 4-5 beats duration only. PVCs  noted. Continue with beta blocker. Checking echo.   Current medicines are reviewed at length with the patient today.  The patient does not have concerns regarding medicines.  The following changes have been made:  no change  Labs/ tests ordered today include:   Orders Placed This Encounter  Procedures  . EKG 12-Lead  . Echocardiogram     Disposition:   [place] 6 months.  Bobby Rumpf, MD  03/31/2015 8:49 AM    [facility] [address] [place], [place], [place]  [address] Phone: [phone]; Fax: [phone]

## 2015-04-01 ENCOUNTER — Other Ambulatory Visit: Payer: Self-pay

## 2015-04-01 ENCOUNTER — Ambulatory Visit (HOSPITAL_COMMUNITY): Payer: PPO | Attending: Cardiovascular Disease

## 2015-04-01 DIAGNOSIS — I471 Supraventricular tachycardia: Secondary | ICD-10-CM | POA: Diagnosis not present

## 2015-04-01 DIAGNOSIS — I10 Essential (primary) hypertension: Secondary | ICD-10-CM | POA: Insufficient documentation

## 2015-04-01 DIAGNOSIS — I517 Cardiomegaly: Secondary | ICD-10-CM | POA: Diagnosis not present

## 2015-04-08 ENCOUNTER — Telehealth: Payer: Self-pay | Admitting: Cardiology

## 2015-04-08 ENCOUNTER — Ambulatory Visit: Payer: PPO | Admitting: Cardiology

## 2015-04-08 DIAGNOSIS — R931 Abnormal findings on diagnostic imaging of heart and coronary circulation: Secondary | ICD-10-CM

## 2015-04-08 NOTE — Telephone Encounter (Signed)
F/u  Pt returning [facility] phone call

## 2015-04-08 NOTE — Telephone Encounter (Signed)
Pt aware of echo results and Dr.[place]ain's recommendation. Overall ejection fraction was reassuring, 50% however there was noted moderate hypokinesis of the mid anteroseptal myocardium. Because of nonsustained ventricular tachycardia, brief episodes seen on event monitor, and minor wall motion abnormality seen on echo card Phillip Heal, I would like to go ahead and move forward with treadmill nuclear stress test  Pt agreeable  Adv pt that a scheduler will call her to schedule the myoview. Pt request the call her on her cell she is heading to the beach for the weekend. Pt given pre-test instructions. Including holding propanolol the morning of test. Pt verbalized understanding to results and instruction given. Message routed to [facility] to schedule.

## 2015-04-14 ENCOUNTER — Telehealth (HOSPITAL_COMMUNITY): Payer: Self-pay | Admitting: *Deleted

## 2015-04-14 NOTE — Telephone Encounter (Signed)
Patient given detailed instructions per Myocardial Perfusion Study Information Sheet for the test on 04/16/15 at 0715. Patient notified to arrive 15 minutes early and that it is imperative to arrive on time for appointment to keep from having the test rescheduled.  If you need to cancel or reschedule your appointment, please call the office within 24 hours of your appointment. Failure to do so may result in a cancellation of your appointment, and a $50 no show fee. Patient verbalized understanding.Catherina Pates, [place]

## 2015-04-15 ENCOUNTER — Encounter: Payer: Self-pay | Admitting: Cardiology

## 2015-04-16 ENCOUNTER — Ambulatory Visit (HOSPITAL_COMMUNITY): Payer: PPO | Attending: Cardiovascular Disease

## 2015-04-16 DIAGNOSIS — R931 Abnormal findings on diagnostic imaging of heart and coronary circulation: Secondary | ICD-10-CM | POA: Diagnosis not present

## 2015-04-16 DIAGNOSIS — R0602 Shortness of breath: Secondary | ICD-10-CM | POA: Diagnosis not present

## 2015-04-16 LAB — MYOCARDIAL PERFUSION IMAGING
Estimated workload: 7 METS
Exercise duration (min): 6 min
Exercise duration (sec): 0 s
MPHR: 144 {beats}/min
Peak HR: 131 {beats}/min
Percent HR: 90 %
RATE: 0.31
Rest HR: 89 {beats}/min
SDS: 1
SRS: 0
SSS: 1
TID: 1.08

## 2015-04-16 MED ORDER — TECHNETIUM TC 99M SESTAMIBI GENERIC - CARDIOLITE
32.6000 | Freq: Once | INTRAVENOUS | Status: AC | PRN
  Administered 2015-04-16: 33 via INTRAVENOUS

## 2015-04-16 MED ORDER — TECHNETIUM TC 99M SESTAMIBI GENERIC - CARDIOLITE
10.9000 | Freq: Once | INTRAVENOUS | Status: AC | PRN
  Administered 2015-04-16: 10.9 via INTRAVENOUS

## 2015-04-22 ENCOUNTER — Encounter (HOSPITAL_COMMUNITY): Payer: PPO

## 2015-06-18 ENCOUNTER — Ambulatory Visit
Admission: RE | Admit: 2015-06-18 | Discharge: 2015-06-18 | Disposition: A | Discharge status: Home / Self Care | Payer: PPO | Source: Ambulatory Visit | Attending: Internal Medicine | Admitting: Internal Medicine

## 2015-06-18 ENCOUNTER — Other Ambulatory Visit: Payer: Self-pay | Admitting: Internal Medicine

## 2015-06-18 DIAGNOSIS — J209 Acute bronchitis, unspecified: Secondary | ICD-10-CM

## 2015-06-21 DIAGNOSIS — I82409 Acute embolism and thrombosis of unspecified deep veins of unspecified lower extremity: Secondary | ICD-10-CM | POA: Insufficient documentation

## 2015-07-28 DIAGNOSIS — Z85828 Personal history of other malignant neoplasm of skin: Secondary | ICD-10-CM | POA: Diagnosis not present

## 2015-07-28 DIAGNOSIS — D485 Neoplasm of uncertain behavior of skin: Secondary | ICD-10-CM | POA: Diagnosis not present

## 2015-07-28 DIAGNOSIS — L249 Irritant contact dermatitis, unspecified cause: Secondary | ICD-10-CM | POA: Diagnosis not present

## 2015-07-28 DIAGNOSIS — L280 Lichen simplex chronicus: Secondary | ICD-10-CM | POA: Diagnosis not present

## 2015-09-25 ENCOUNTER — Encounter: Payer: Self-pay | Admitting: Cardiology

## 2015-09-25 ENCOUNTER — Ambulatory Visit (INDEPENDENT_AMBULATORY_CARE_PROVIDER_SITE_OTHER): Payer: PPO | Admitting: Cardiology

## 2015-09-25 VITALS — BP 124/80 | HR 60 | Ht 67.5 in | Wt 149.4 lb

## 2015-09-25 DIAGNOSIS — R001 Bradycardia, unspecified: Secondary | ICD-10-CM | POA: Diagnosis not present

## 2015-09-25 DIAGNOSIS — I10 Essential (primary) hypertension: Secondary | ICD-10-CM | POA: Diagnosis not present

## 2015-09-25 DIAGNOSIS — I471 Supraventricular tachycardia: Secondary | ICD-10-CM | POA: Diagnosis not present

## 2015-09-25 NOTE — Progress Notes (Signed)
Cardiology Office Note   Date:  09/25/2015   ID:  Christy [name], Christy [name] March 31, 1941, MRN [id number]  PCP:  Christy Low, MD  Cardiologist:   Christy Furbish, MD       History of Present Illness: Christy [name] is a 75 y.o. female Christy [name] husband my patient-enjoys Prince Solian) recently saw Christy [name] on 03/03/15 for the evaluation of bradycardia with long-standing paroxysmal atrial tachycardia dating back to 1971 with no associated syncope. Beta blockers for years.  On September 6 she was at the beach with her husband, Christy [name], had an episode of feeling tired, shortness of breath, sleepy, fatigued and checked her heart rate pulse was in the 40s. She is quite diligent about blood pressure measurements. Systolics were in the [id number] to 180s. Heart rates gradually increased to the 50s and 60s.  At that time she had stopped taking her digoxin. She saw Dr. Deforest Hoyles on September 8. Continue the beta blocker. An EKG performed then show first-degree AV block PVC.  Rare bursts of paroxysmal atrial tachycardia have been throughout her life. Cannot recall the last time she had a long episode. They can happen usually at nighttime while sleeping.  No syncope, no orthopnea, no PND. Occasional mild lower extremity edema. Varicose veins.  Since stopping the digoxin, she has been feeling okay. She does occasionally feel and expiratory upper airway wheeze. She wonders if she is developing late onset asthma.  She also showed me blood pressures previously that were excellent on her logbook. Sometimes she is worried about them being labile. Sometimes she becomes anxious when checking her blood pressures.  09/25/15-occasionally will feel palpitations during stressful episodes. She may have a glass of wine in the go away. She is on very [name]-dose Inderal. When she feels palpitations, she gets quite anxious. We discussed reassuring nuclear stress test, reassuring ejection fraction. We discussed the possibility of changing her  Inderal but she wishes to continue with this.    Past Medical History  Diagnosis Date  . Paroxysmal atrial tachycardia ([place])   . Dyslipidemia   . Bilateral lower extremity edema     Chronic, likely related to venous stasis   . Varicose veins of both legs with edema   . History of hepatitis C virus infection     In remission  . GERD (gastroesophageal reflux disease)   . Lung granuloma ([place]) 2004     CT scan  . Osteopenia   . Vitamin D deficiency   . DJD (degenerative joint disease), lumbosacral   . Spondylolisthesis     Status post epidural injections 3 by neurosurgery  - Dr. Joya Salm     Past Surgical History  Procedure Laterality Date  . Neck surgery    . Toe surgery      replaced joint     Current Outpatient Prescriptions  Medication Sig Dispense Refill  . estradiol-norethindrone (COMBIPATCH) 0.05-0.25 MG/DAY Place 1 patch onto the skin 2 (two) times a week.    . pantoprazole (PROTONIX) 40 MG tablet Take 40 mg by mouth daily.    . propranolol (INDERAL) 20 MG tablet Take 1 tablet (20 mg total) by mouth daily. 90 tablet 3   No current facility-administered medications for this visit.    Allergies:   Epinephrine    Social History:  The patient  reports that she has never smoked. She has never used smokeless tobacco. She reports that she drinks alcohol. She reports that she does not use illicit drugs.   Family History:  The patient's family history includes Heart Problems in her mother.    ROS:  Please see the history of present illness.   Otherwise, review of systems are positive for none.   All other systems are reviewed and negative.    PHYSICAL EXAM: VS:  BP 124/80 mmHg  Pulse 60  Ht 5' 7.5" (1.715 m)  Wt 149 lb 6.4 oz (67.767 kg)  BMI 23.04 kg/m2 , BMI Body mass index is 23.04 kg/(m^2). GEN: Well nourished, well developed, in no acute distress HEENT: normal Neck: no JVD, carotid bruits, or masses Cardiac: RRR; no murmurs, rubs, or gallops,no edema    Respiratory:  clear to auscultation bilaterally, normal work of breathing GI: soft, nontender, nondistended, + BS MS: no deformity or atrophy Skin: warm and dry, no rash Neuro:  Strength and sensation are intact Psych: euthymic mood, full affect   EKG: Today 03/31/15-sinus rhythm, 62, first-degree AV block, PR interval 240 ms. Poor R-wave progression in V2 with no PVCs.. Prior EKG heart rate 68 bpm, normal sinus rhythm, first-degree AV block with PR interval of 244 ms and monomorphic PVCs. Poor R-wave progression.   Recent Labs: No results found for requested labs within last 365 days.    Lipid Panel No results found for: CHOL, TRIG, HDL, CHOLHDL, VLDL, LDLCALC, LDLDIRECT    Wt Readings from Last 3 Encounters:  09/25/15 149 lb 6.4 oz (67.767 kg)  04/16/15 148 lb (67.132 kg)  03/31/15 148 lb 6.4 oz (67.314 kg)      Other studies Reviewed: Additional studies/ records that were reviewed today include: Prior office records reviewed, lab work, EKG. Review of the above records demonstrates: As above  ECHO 04/01/15: Left ventricle: The cavity size was normal. Wall thickness was  normal. Systolic function was normal. The estimated ejection  fraction was in the range of 50% to 55%. Moderate hypokinesis of  the midanteroseptal myocardium. - Left atrium: The atrium was severely dilated. - Right atrium: The atrium was moderately dilated. - Pulmonary arteries: Systolic pressure was moderately increased.  PA peak pressure: 44 mm Hg (S).  NUC stress 04/16/15   Upsloping ST segment depression ST segment depression of 0.5 mm was noted during stress in the I, II and III leads.   This is a [name] risk study.  1. No evidence for ischemia or infarction.  2. Frequent PVCs noted throughout study (pre-exercise, during exercise, and post-exercise).  3. Study not gated because of PVCs.  4. Overall [name] risk study.   ASSESSMENT AND PLAN:   Bradycardia  - Heart rates from the 40s.  Off of digoxin. Continued with Inderal. Last heart rate in the 50s and 60s. Rare PVCs. Event monitor was placed. Event monitor showed mostly sinus rhythm with heart rates ranging between 60 and 80. Intermittent PVCs were noted in singles and couplets, a few beats of nonsustained VT of 4-5 beats were also noted. Events were triggered. No symptoms. No evidence of significant bradycardia. She has not had any high risk symptoms such as syncope with exercise for instance.   Paroxysmal atrial tachycardia  - Rare episodes. Off of digoxin.  - Continue Inderal.   First-degree AV block  - Mild, watch with Inderal.    Nonsustained ventricular tachycardia -rare episode seen on event monitor. 4-5 beats duration only. PVCs noted. Continue with beta blocker. Normal EF. Reassuring NUC. No syncope. PVCs do cause some consternation. She is worried about this at times. For instance, under stress sometimes. She sometimes drinks a glass of wine  and this seems to help out considerably. I asked her to discuss this with Dr. Lysle Rubens, perhaps her anxiety could be treated.   Current medicines are reviewed at length with the patient today.  The patient does not have concerns regarding medicines.  The following changes have been made:  no change  Labs/ tests ordered today include:   No orders of the defined types were placed in this encounter.     Disposition:   [place] 6 months.  Bobby Rumpf, MD  09/25/2015 10:49 AM    [facility] [address], [place], [place]  [address] Phone: [phone]; Fax: [phone]

## 2015-09-25 NOTE — Patient Instructions (Signed)

## 2015-11-06 ENCOUNTER — Telehealth: Payer: Self-pay | Admitting: Cardiology

## 2015-11-06 NOTE — Telephone Encounter (Signed)
New message       Pt c/o medication issue:  1. Name of Medication: inderal 2. How are you currently taking this medication (dosage and times per day)? 20mg  3. Are you having a reaction (difficulty breathing--STAT)? no 4. What is your medication issue?  Talk to the nurse about medication.  Pt would not tell me what she wanted

## 2015-11-06 NOTE — Telephone Encounter (Addendum)
Pt states that at her last OV she mentioned to Dr. Marlou Porch that she has been taking an extra half of her Inderal at night and that helps with her PVC's. Pt is almost out of medication and she states that insurance will not fill it until the end of June with the prescription the way it is currently at 20mg  QD. Pt would like a prescription sent in that reflects how she is currently taking the medication which is: Inderal 20mg  q AM and 10mg  q PM. Pt is leaving Tuesday to go out of town for 2-3 weeks and was hoping to get medication picked up on Monday. Advised pt that I will route message to Dr. Marlou Porch for review and advisement and will call her back once we hear back.

## 2015-11-06 NOTE — Telephone Encounter (Signed)
I approve this change.  Candee Furbish, MD

## 2015-11-09 ENCOUNTER — Telehealth: Payer: Self-pay | Admitting: Cardiology

## 2015-11-09 MED ORDER — PROPRANOLOL HCL 20 MG PO TABS: 20.0000 mg | ORAL_TABLET | ORAL | Status: DC

## 2015-11-09 NOTE — Telephone Encounter (Signed)
Order for RX sent into pharmacy as requested.

## 2015-11-09 NOTE — Telephone Encounter (Signed)
This was completed this AM at 9:16 - please check with pharmacy

## 2015-11-09 NOTE — Telephone Encounter (Signed)
Pt is aware.  

## 2015-11-09 NOTE — Telephone Encounter (Signed)
New message       *STAT* If patient is at the pharmacy, call can be transferred to refill team.   1. Which medications need to be refilled? (please list name of each medication and dose if known) Inderal 20 mg in am and 1/2 tablet at night  2. Which pharmacy/location (including street and city if local pharmacy) is medication to be sent to? [facility]  3. Do they need a 30 day or 90 day supply? 90 day     The pt is coming up short, the pt is asking if the MD to call in prescription at the drug store 1 for the 20mg  po tablet that they take in the morning and another for the 10 mg tablet they take at night

## 2015-12-18 ENCOUNTER — Telehealth: Payer: Self-pay | Admitting: Cardiology

## 2015-12-18 NOTE — Telephone Encounter (Signed)
Reviewed recommendations/orders from Dr Marlou Porch with pt.  She states understanding.  She also states she saw her PCP today and was given some Xanax to take 1/2 tablet twice a day for anxiety.

## 2015-12-18 NOTE — Telephone Encounter (Signed)
Spoke with pt who is reporting for the past 1 or so she has been having an increase in the number of and frequency of her palpitations.  She reports feeling like her heart is beating so hard and so fast sometimes.  Last night it woke her from her sleep.  On occasion for about 3 to 4 seconds she feels like she may pass out.  She has been basically taking Inderal 20 mg twice a day to help control this though it doesn't seem to completely help the palps.  She admits to being extremely anxious and is asking about anti-anxiety medications.  Advised she will need to be evaluated by PCP for treatment of anxiety however I will review information about her palps with Dr Marlou Porch and call her back.  HR has been around 68  BP "OK".

## 2015-12-18 NOTE — Telephone Encounter (Signed)
I'm fine with her continuing with Inderal 20 mg twice a day. Her lightheadedness is likely related to her anxiety and a result of hyperventilation perhaps. She was quite stressed on the phone. If she needs to take an extra 20 mg through the day that is fine but I agree with Pam, I think that she needs to discuss anxiety with Dr. Lysle Rubens.  Christy Furbish, MD

## 2015-12-18 NOTE — Telephone Encounter (Signed)
New Message  Pt requested to be seen today. Please call back and discuss.   Patient c/o Palpitations:  High priority if patient c/o lightheadedness and shortness of breath.  1. How long have you been having palpitations? About a month- has gotten worse recently    2. Are you currently experiencing lightheadedness and shortness of breath?lightheaded,  3. Have you checked your BP and heart rate? (document readings) this am- 124/72 p 68  4. Are you experiencing any other symptoms? Feels like she might pass out

## 2015-12-30 DIAGNOSIS — L821 Other seborrheic keratosis: Secondary | ICD-10-CM | POA: Diagnosis not present

## 2015-12-30 DIAGNOSIS — D0471 Carcinoma in situ of skin of right lower limb, including hip: Secondary | ICD-10-CM | POA: Diagnosis not present

## 2015-12-30 DIAGNOSIS — D485 Neoplasm of uncertain behavior of skin: Secondary | ICD-10-CM | POA: Diagnosis not present

## 2015-12-30 DIAGNOSIS — Z85828 Personal history of other malignant neoplasm of skin: Secondary | ICD-10-CM | POA: Diagnosis not present

## 2015-12-30 DIAGNOSIS — C44729 Squamous cell carcinoma of skin of left lower limb, including hip: Secondary | ICD-10-CM | POA: Diagnosis not present

## 2015-12-30 DIAGNOSIS — D1801 Hemangioma of skin and subcutaneous tissue: Secondary | ICD-10-CM | POA: Diagnosis not present

## 2015-12-30 DIAGNOSIS — L814 Other melanin hyperpigmentation: Secondary | ICD-10-CM | POA: Diagnosis not present

## 2015-12-30 DIAGNOSIS — L720 Epidermal cyst: Secondary | ICD-10-CM | POA: Diagnosis not present

## 2016-01-01 DIAGNOSIS — H52222 Regular astigmatism, left eye: Secondary | ICD-10-CM | POA: Diagnosis not present

## 2016-01-01 DIAGNOSIS — H25813 Combined forms of age-related cataract, bilateral: Secondary | ICD-10-CM | POA: Diagnosis not present

## 2016-01-01 DIAGNOSIS — H43819 Vitreous degeneration, unspecified eye: Secondary | ICD-10-CM | POA: Diagnosis not present

## 2016-01-01 DIAGNOSIS — H5203 Hypermetropia, bilateral: Secondary | ICD-10-CM | POA: Diagnosis not present

## 2016-01-13 DIAGNOSIS — K529 Noninfective gastroenteritis and colitis, unspecified: Secondary | ICD-10-CM | POA: Diagnosis not present

## 2016-01-19 DIAGNOSIS — I2699 Other pulmonary embolism without acute cor pulmonale: Secondary | ICD-10-CM

## 2016-01-19 HISTORY — DX: Other pulmonary embolism without acute cor pulmonale: I26.99

## 2016-01-20 DIAGNOSIS — R5383 Other fatigue: Secondary | ICD-10-CM | POA: Diagnosis not present

## 2016-01-20 DIAGNOSIS — R197 Diarrhea, unspecified: Secondary | ICD-10-CM | POA: Diagnosis not present

## 2016-01-20 DIAGNOSIS — R110 Nausea: Secondary | ICD-10-CM | POA: Diagnosis not present

## 2016-01-24 ENCOUNTER — Emergency Department (HOSPITAL_COMMUNITY): Payer: PPO

## 2016-01-24 ENCOUNTER — Encounter (HOSPITAL_COMMUNITY): Payer: Self-pay | Admitting: *Deleted

## 2016-01-24 ENCOUNTER — Inpatient Hospital Stay (HOSPITAL_COMMUNITY)
Admission: EM | Admit: 2016-01-24 | Discharge: 2016-01-29 | DRG: 175 | Disposition: A | Discharge status: Home / Self Care | Payer: PPO | Attending: Family Medicine | Admitting: Family Medicine

## 2016-01-24 DIAGNOSIS — I4891 Unspecified atrial fibrillation: Secondary | ICD-10-CM | POA: Diagnosis present

## 2016-01-24 DIAGNOSIS — E8770 Fluid overload, unspecified: Secondary | ICD-10-CM | POA: Diagnosis present

## 2016-01-24 DIAGNOSIS — I878 Other specified disorders of veins: Secondary | ICD-10-CM | POA: Diagnosis not present

## 2016-01-24 DIAGNOSIS — I2699 Other pulmonary embolism without acute cor pulmonale: Secondary | ICD-10-CM | POA: Diagnosis present

## 2016-01-24 DIAGNOSIS — I2602 Saddle embolus of pulmonary artery with acute cor pulmonale: Secondary | ICD-10-CM | POA: Diagnosis not present

## 2016-01-24 DIAGNOSIS — I10 Essential (primary) hypertension: Secondary | ICD-10-CM | POA: Diagnosis not present

## 2016-01-24 DIAGNOSIS — T385X5A Adverse effect of other estrogens and progestogens, initial encounter: Secondary | ICD-10-CM | POA: Diagnosis not present

## 2016-01-24 DIAGNOSIS — Z5181 Encounter for therapeutic drug level monitoring: Secondary | ICD-10-CM

## 2016-01-24 DIAGNOSIS — I471 Supraventricular tachycardia: Secondary | ICD-10-CM | POA: Diagnosis not present

## 2016-01-24 DIAGNOSIS — Z7901 Long term (current) use of anticoagulants: Secondary | ICD-10-CM

## 2016-01-24 DIAGNOSIS — K219 Gastro-esophageal reflux disease without esophagitis: Secondary | ICD-10-CM | POA: Diagnosis not present

## 2016-01-24 DIAGNOSIS — Z888 Allergy status to other drugs, medicaments and biological substances status: Secondary | ICD-10-CM

## 2016-01-24 DIAGNOSIS — M8580 Other specified disorders of bone density and structure, unspecified site: Secondary | ICD-10-CM | POA: Diagnosis present

## 2016-01-24 DIAGNOSIS — I5030 Unspecified diastolic (congestive) heart failure: Secondary | ICD-10-CM | POA: Diagnosis not present

## 2016-01-24 DIAGNOSIS — I2609 Other pulmonary embolism with acute cor pulmonale: Secondary | ICD-10-CM | POA: Diagnosis not present

## 2016-01-24 DIAGNOSIS — I82402 Acute embolism and thrombosis of unspecified deep veins of left lower extremity: Secondary | ICD-10-CM | POA: Diagnosis not present

## 2016-01-24 DIAGNOSIS — Z79899 Other long term (current) drug therapy: Secondary | ICD-10-CM | POA: Diagnosis not present

## 2016-01-24 DIAGNOSIS — J9601 Acute respiratory failure with hypoxia: Secondary | ICD-10-CM | POA: Diagnosis not present

## 2016-01-24 DIAGNOSIS — I2601 Septic pulmonary embolism with acute cor pulmonale: Secondary | ICD-10-CM | POA: Diagnosis not present

## 2016-01-24 DIAGNOSIS — L03114 Cellulitis of left upper limb: Secondary | ICD-10-CM | POA: Diagnosis not present

## 2016-01-24 DIAGNOSIS — F419 Anxiety disorder, unspecified: Secondary | ICD-10-CM | POA: Diagnosis not present

## 2016-01-24 DIAGNOSIS — M79609 Pain in unspecified limb: Secondary | ICD-10-CM | POA: Diagnosis not present

## 2016-01-24 DIAGNOSIS — N951 Menopausal and female climacteric states: Secondary | ICD-10-CM | POA: Diagnosis present

## 2016-01-24 DIAGNOSIS — R05 Cough: Secondary | ICD-10-CM | POA: Diagnosis not present

## 2016-01-24 HISTORY — DX: Other pulmonary embolism without acute cor pulmonale: I26.99

## 2016-01-24 LAB — COMPREHENSIVE METABOLIC PANEL
ALT: 17 U/L (ref 14–54)
AST: 22 U/L (ref 15–41)
Albumin: 3.4 g/dL — ABNORMAL LOW (ref 3.5–5.0)
Alkaline Phosphatase: 46 U/L (ref 38–126)
Anion gap: 11 (ref 5–15)
BUN: 9 mg/dL (ref 6–20)
CO2: 23 mmol/L (ref 22–32)
Calcium: 9 mg/dL (ref 8.9–10.3)
Chloride: 104 mmol/L (ref 101–111)
Creatinine, Ser: 1.01 mg/dL — ABNORMAL HIGH (ref 0.44–1.00)
GFR calc Af Amer: >60 mL/min (ref >60)
GFR calc non Af Amer: 52 mL/min — ABNORMAL LOW (ref >60)
Glucose, Bld: 103 mg/dL — ABNORMAL HIGH (ref 65–99)
Potassium: 4.1 mmol/L (ref 3.5–5.1)
Sodium: 138 mmol/L (ref 135–145)
Total Bilirubin: 1.2 mg/dL (ref 0.3–1.2)
Total Protein: 6.6 g/dL (ref 6.5–8.1)

## 2016-01-24 LAB — CBC WITH DIFFERENTIAL/PLATELET
Basophils Absolute: 0 10*3/uL (ref 0.0–0.1)
Basophils Relative: 0 %
Eosinophils Absolute: 0.1 10*3/uL (ref 0.0–0.7)
Eosinophils Relative: 1 %
HCT: 36.8 % (ref 36.0–46.0)
Hemoglobin: 12 g/dL (ref 12.0–15.0)
Lymphocytes Relative: 20 %
Lymphs Abs: 2.1 10*3/uL (ref 0.7–4.0)
MCH: 31.8 pg (ref 26.0–34.0)
MCHC: 32.6 g/dL (ref 30.0–36.0)
MCV: 97.6 fL (ref 78.0–100.0)
Monocytes Absolute: 1.3 10*3/uL — ABNORMAL HIGH (ref 0.1–1.0)
Monocytes Relative: 12 %
Neutro Abs: 7.1 10*3/uL (ref 1.7–7.7)
Neutrophils Relative %: 67 %
Platelets: 232 10*3/uL (ref 150–400)
RBC: 3.77 MIL/uL — ABNORMAL LOW (ref 3.87–5.11)
RDW: 13.1 % (ref 11.5–15.5)
WBC: 10.6 10*3/uL — ABNORMAL HIGH (ref 4.0–10.5)

## 2016-01-24 LAB — I-STAT TROPONIN, ED: Troponin i, poc: 0.01 ng/mL (ref 0.00–0.08)

## 2016-01-24 MED ORDER — ONDANSETRON HCL 4 MG/2ML IJ SOLN
4.0000 mg | Freq: Once | INTRAMUSCULAR | Status: AC
  Administered 2016-01-24: 4 mg via INTRAVENOUS
  Filled 2016-01-24: qty 2

## 2016-01-24 MED ORDER — LEVOFLOXACIN 750 MG PO TABS
750.0000 mg | ORAL_TABLET | Freq: Once | ORAL | Status: AC
  Administered 2016-01-24: 750 mg via ORAL
  Filled 2016-01-24: qty 1

## 2016-01-24 MED ORDER — ENOXAPARIN SODIUM 80 MG/0.8ML ~~LOC~~ SOLN
1.0000 mg/kg | Freq: Once | SUBCUTANEOUS | Status: AC
  Administered 2016-01-24: 65 mg via SUBCUTANEOUS
  Filled 2016-01-24: qty 0.8

## 2016-01-24 MED ORDER — SODIUM CHLORIDE 0.9 % IV SOLN
Freq: Once | INTRAVENOUS | Status: AC
  Administered 2016-01-24: 22:00:00 via INTRAVENOUS

## 2016-01-24 MED ORDER — IOPAMIDOL (ISOVUE-370) INJECTION 76%
INTRAVENOUS | Status: AC
  Administered 2016-01-24: 75 mL
  Filled 2016-01-24: qty 100

## 2016-01-24 MED ORDER — FENTANYL CITRATE (PF) 100 MCG/2ML IJ SOLN
50.0000 ug | Freq: Once | INTRAMUSCULAR | Status: AC
  Administered 2016-01-24: 50 ug via INTRAVENOUS
  Filled 2016-01-24: qty 2

## 2016-01-24 NOTE — ED Provider Notes (Signed)
  Face-to-face evaluation   History:  She presents for evaluation of shortness of breath and left leg pain.  Physical exam: Elderly female. No respiratory distress. Left leg more swollen than right.  Medical screening examination/treatment/procedure(s) were conducted as a shared visit with non-physician practitioner(s) and myself.  I personally evaluated the patient during the encounter   Daleen Bo, MD 01/25/16 [phone]

## 2016-01-24 NOTE — ED Notes (Signed)
Pt in CT at this time.

## 2016-01-24 NOTE — ED Triage Notes (Signed)
Patient has finished antibiotics for intestinal bacterial infection - now presents with c/p right rib pain increases with coughing, generalized weakness and left calf pain.  States she has been laying around doing nothing because of feeling tired

## 2016-01-24 NOTE — ED Notes (Signed)
Heat pack given to pt for rib pain

## 2016-01-24 NOTE — ED Notes (Signed)
Pt c/o jabbing pain in right rib area.  St's pain just started a few minutes ago.  St's worse with deep breathing.  Pt also st's pain is different from pain she was having before

## 2016-01-24 NOTE — ED Provider Notes (Signed)
[facility] DEPT Provider Note   CSN: [id number] Arrival date & time: 01/24/16  1956  First Provider Contact:  None       History   Chief Complaint Chief Complaint  Patient presents with  . Other    Rib Pain  . Other    Left Calf Pain  . Other    HTN    HPI Christy [name] is a 75 y.o. female.  This is a 75 year old female recently treated for diarrhea with Cipro Flagyl that she is to proximal in one week ago.  Since that time she has just not felt well with fatigue, decreased appetite, low-grade temperature, started 3 days ago, associated with a cough.  She states that she also has noticed pain in the posterior portion of her right calf.  He has a history of paroxysmal tachycardia which she's had for years.  She's had numerous cardiac evaluations for same.      Past Medical History:  Diagnosis Date  . Bilateral lower extremity edema    Chronic, likely related to venous stasis   . DJD (degenerative joint disease), lumbosacral   . Dyslipidemia   . GERD (gastroesophageal reflux disease)   . History of hepatitis C virus infection    In remission  . 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 Osteopenia   . Paroxysmal atrial tachycardia ([place])   . Spondylolisthesis    Status post epidural injections 3 by neurosurgery  - Dr. Joya Salm   . Varicose veins of both legs with edema   . Vitamin D deficiency     Patient Active Problem List   Diagnosis Date Noted  . Bradycardia 03/10/2015  . PAT (paroxysmal atrial tachycardia) ([place]) 03/10/2015  . Essential hypertension 03/10/2015  . Inguinal adenopathy 05/02/2013    Past Surgical History:  Procedure Laterality Date  . NECK SURGERY    . TOE SURGERY     replaced joint    OB History    No data available       Home Medications    Prior to Admission medications   Medication Sig Start Date End Date Taking? Authorizing Provider  estradiol-norethindrone [facility]) 0.05-0.25 MG/DAY Place 1 patch onto the skin 2 (two)  times a week.    Historical Provider, MD  pantoprazole (PROTONIX) 40 MG tablet Take 40 mg by mouth daily.    Historical Provider, MD  propranolol (INDERAL) 20 MG tablet Take 1 tablet (20 mg total) by mouth every morning. And 1/2 tablet (10 mg) at bedtime 11/09/15   Jerline Pain, MD    Family History Family History  Problem Relation Age of Onset  . Heart Problems Mother     Social History Social History  Substance Use Topics  . Smoking status: Never Smoker  . Smokeless tobacco: Never Used  . Alcohol use Yes     Comment: wine daily     Allergies   Epinephrine   Review of Systems Review of Systems  Constitutional: Positive for appetite change, fatigue and fever.  Respiratory: Positive for cough and shortness of breath.   Cardiovascular: Positive for leg swelling.  All other systems reviewed and are negative.    Physical Exam Updated Vital Signs BP (!) 133/115 (BP Location: Right Arm)   Pulse 68   Temp 97.8 F (36.6 C) (Oral)   Resp 20   Ht 5\' 7"  (1.702 m)   Wt 65.8 kg   SpO2 97%   BMI 22.71 kg/m  Physical Exam  Constitutional: She appears well-developed and well-nourished.  HENT:  Head: Normocephalic.  Eyes: Pupils are equal, round, and reactive to light.  Neck: Normal range of motion.  Cardiovascular: Tachycardia present.  Exam reveals no gallop.   No murmur heard. Pulmonary/Chest: Effort normal and breath sounds normal. No respiratory distress. She has no wheezes. She exhibits tenderness.  Abdominal: Soft. She exhibits no distension. There is no tenderness.  Musculoskeletal: She exhibits edema and tenderness.       Legs: Skin: No rash noted. No erythema.  Nursing note and vitals reviewed.    ED Treatments / Results  Labs (all labs ordered are listed, but only abnormal results are displayed) Labs Reviewed  CBC WITH DIFFERENTIAL/PLATELET - Abnormal; Notable for the following:       Result Value   WBC 10.6 (*)    RBC 3.77 (*)    Monocytes Absolute  1.3 (*)    All other components within normal limits  COMPREHENSIVE METABOLIC PANEL - Abnormal; Notable for the following:    Glucose, Bld 103 (*)    Creatinine, Ser 1.01 (*)    Albumin 3.4 (*)    GFR calc non Af Amer 52 (*)    All other components within normal limits  I-STAT TROPOININ, ED    EKG  EKG Interpretation None       Radiology Dg Chest 2 View  Result Date: 01/24/2016 CLINICAL DATA:  Right rib pain, cough, generalized weakness and low-grade fever. History of recent intestinal infection. EXAM: CHEST  2 VIEW COMPARISON:  PA and lateral chest 06/18/2015. FINDINGS: Patchy airspace disease is seen in the right lower lobe and there is a small right pleural effusion. The left lung is clear. Heart size is enlarged. No pneumothorax. Aortic atherosclerosis is identified. IMPRESSION: Patchy right lower lobe airspace disease and small right effusion worrisome for pneumonia. Cardiomegaly without edema. Atherosclerosis. Electronically Signed   By: Inge Rise M.D.   On: 01/24/2016 20:43    Procedures Procedures (including critical care time)  Medications Ordered in ED Medications  levofloxacin (LEVAQUIN) tablet 750 mg (not administered)  enoxaparin (LOVENOX) injection 65 mg (not administered)     Initial Impression / Assessment and Plan / ED Course  I have reviewed the triage vital signs and the nursing notes.  Pertinent labs & imaging results that were available during my care of the patient were reviewed by me and considered in my medical decision making (see chart for details).  Clinical Course     Patient has a pneumonia identified on her x-ray.  Will give levofloxacin as she has recently finished Cipro and Flagyl.  I also will start Lovenox for potential DVT in her left calf and ask her to return in the morning for a vascular study.  She has been offered pain medication but has refused at this time. As call back to the room for increased patency pain.  She has agreed  to IV pain medication to her.  Her persistent tachycardia despite her history of PAT and with her potential DVT.  I discussed with Dr. Eulis Foster continued care and we agree that a chest CT angios to rule out PE is warranted at this time.  This has been discussed with the patient who is in agreement Radiologist called with report of bilateral PEs with right heart strain.  I contacted pulmonary critical care.  Her risk stratification [place] score is 77, putting her in a class II.  Low risk category.  He recommends IV heparin.  Admission to hospitalist telemetry bed.  Final Clinical Impressions(s) / ED Diagnoses   Final diagnoses:  None    New Prescriptions New Prescriptions   No medications on file     Junius Creamer, NP 01/25/16 [id number]    Daleen Bo, MD 01/25/16 [phone]

## 2016-01-25 ENCOUNTER — Encounter (HOSPITAL_COMMUNITY): Payer: Self-pay | Admitting: Internal Medicine

## 2016-01-25 ENCOUNTER — Observation Stay (HOSPITAL_BASED_OUTPATIENT_CLINIC_OR_DEPARTMENT_OTHER): Payer: PPO

## 2016-01-25 DIAGNOSIS — M79609 Pain in unspecified limb: Secondary | ICD-10-CM

## 2016-01-25 DIAGNOSIS — I2699 Other pulmonary embolism without acute cor pulmonale: Secondary | ICD-10-CM | POA: Diagnosis present

## 2016-01-25 DIAGNOSIS — I10 Essential (primary) hypertension: Secondary | ICD-10-CM | POA: Diagnosis not present

## 2016-01-25 DIAGNOSIS — J9601 Acute respiratory failure with hypoxia: Secondary | ICD-10-CM | POA: Diagnosis not present

## 2016-01-25 DIAGNOSIS — E8770 Fluid overload, unspecified: Secondary | ICD-10-CM | POA: Diagnosis not present

## 2016-01-25 DIAGNOSIS — I471 Supraventricular tachycardia: Secondary | ICD-10-CM | POA: Diagnosis not present

## 2016-01-25 DIAGNOSIS — Z5181 Encounter for therapeutic drug level monitoring: Secondary | ICD-10-CM | POA: Diagnosis not present

## 2016-01-25 DIAGNOSIS — Z888 Allergy status to other drugs, medicaments and biological substances status: Secondary | ICD-10-CM | POA: Diagnosis not present

## 2016-01-25 DIAGNOSIS — F419 Anxiety disorder, unspecified: Secondary | ICD-10-CM | POA: Diagnosis not present

## 2016-01-25 DIAGNOSIS — Z7901 Long term (current) use of anticoagulants: Secondary | ICD-10-CM | POA: Diagnosis not present

## 2016-01-25 DIAGNOSIS — M8580 Other specified disorders of bone density and structure, unspecified site: Secondary | ICD-10-CM | POA: Diagnosis not present

## 2016-01-25 DIAGNOSIS — I2601 Septic pulmonary embolism with acute cor pulmonale: Secondary | ICD-10-CM | POA: Diagnosis not present

## 2016-01-25 DIAGNOSIS — I2602 Saddle embolus of pulmonary artery with acute cor pulmonale: Secondary | ICD-10-CM | POA: Diagnosis not present

## 2016-01-25 DIAGNOSIS — Z79899 Other long term (current) drug therapy: Secondary | ICD-10-CM | POA: Diagnosis not present

## 2016-01-25 DIAGNOSIS — L03114 Cellulitis of left upper limb: Secondary | ICD-10-CM | POA: Diagnosis not present

## 2016-01-25 DIAGNOSIS — I2609 Other pulmonary embolism with acute cor pulmonale: Secondary | ICD-10-CM | POA: Diagnosis not present

## 2016-01-25 DIAGNOSIS — I82402 Acute embolism and thrombosis of unspecified deep veins of left lower extremity: Secondary | ICD-10-CM | POA: Diagnosis not present

## 2016-01-25 DIAGNOSIS — K219 Gastro-esophageal reflux disease without esophagitis: Secondary | ICD-10-CM | POA: Diagnosis not present

## 2016-01-25 DIAGNOSIS — I4891 Unspecified atrial fibrillation: Secondary | ICD-10-CM | POA: Diagnosis not present

## 2016-01-25 DIAGNOSIS — N951 Menopausal and female climacteric states: Secondary | ICD-10-CM | POA: Diagnosis not present

## 2016-01-25 DIAGNOSIS — T385X5A Adverse effect of other estrogens and progestogens, initial encounter: Secondary | ICD-10-CM | POA: Diagnosis not present

## 2016-01-25 DIAGNOSIS — I878 Other specified disorders of veins: Secondary | ICD-10-CM | POA: Diagnosis not present

## 2016-01-25 DIAGNOSIS — I5030 Unspecified diastolic (congestive) heart failure: Secondary | ICD-10-CM | POA: Diagnosis not present

## 2016-01-25 DIAGNOSIS — R05 Cough: Secondary | ICD-10-CM | POA: Diagnosis not present

## 2016-01-25 LAB — CBC
HCT: 34.9 % — ABNORMAL LOW (ref 36.0–46.0)
Hemoglobin: 11.3 g/dL — ABNORMAL LOW (ref 12.0–15.0)
MCH: 31.3 pg (ref 26.0–34.0)
MCHC: 32.4 g/dL (ref 30.0–36.0)
MCV: 96.7 fL (ref 78.0–100.0)
Platelets: 201 10*3/uL (ref 150–400)
RBC: 3.61 MIL/uL — ABNORMAL LOW (ref 3.87–5.11)
RDW: 12.9 % (ref 11.5–15.5)
WBC: 10.1 10*3/uL (ref 4.0–10.5)

## 2016-01-25 LAB — TROPONIN I
Troponin I: <0.03 ng/mL (ref <0.03)
Troponin I: <0.03 ng/mL (ref <0.03)
Troponin I: <0.03 ng/mL (ref <0.03)
Troponin I: <0.03 ng/mL (ref <0.03)

## 2016-01-25 LAB — COMPREHENSIVE METABOLIC PANEL
ALT: 15 U/L (ref 14–54)
AST: 18 U/L (ref 15–41)
Albumin: 3 g/dL — ABNORMAL LOW (ref 3.5–5.0)
Alkaline Phosphatase: 41 U/L (ref 38–126)
Anion gap: 7 (ref 5–15)
BUN: 8 mg/dL (ref 6–20)
CO2: 23 mmol/L (ref 22–32)
Calcium: 8.4 mg/dL — ABNORMAL LOW (ref 8.9–10.3)
Chloride: 107 mmol/L (ref 101–111)
Creatinine, Ser: 0.76 mg/dL (ref 0.44–1.00)
GFR calc Af Amer: >60 mL/min (ref >60)
GFR calc non Af Amer: >60 mL/min (ref >60)
Glucose, Bld: 112 mg/dL — ABNORMAL HIGH (ref 65–99)
Potassium: 4.2 mmol/L (ref 3.5–5.1)
Sodium: 137 mmol/L (ref 135–145)
Total Bilirubin: 0.9 mg/dL (ref 0.3–1.2)
Total Protein: 5.6 g/dL — ABNORMAL LOW (ref 6.5–8.1)

## 2016-01-25 LAB — HEPARIN LEVEL (UNFRACTIONATED): Heparin Unfractionated: 0.27 IU/mL — ABNORMAL LOW (ref 0.30–0.70)

## 2016-01-25 LAB — PROTIME-INR
INR: 1.2
Prothrombin Time: 15.3 seconds — ABNORMAL HIGH (ref 11.4–15.2)

## 2016-01-25 LAB — ECHOCARDIOGRAM COMPLETE
Height: 67 in
Weight: 2539.2 oz

## 2016-01-25 LAB — LACTIC ACID, PLASMA: Lactic Acid, Venous: 1.1 mmol/L (ref 0.5–1.9)

## 2016-01-25 LAB — TSH: TSH: 2.609 u[IU]/mL (ref 0.350–4.500)

## 2016-01-25 MED ORDER — PANTOPRAZOLE SODIUM 40 MG PO TBEC
40.0000 mg | DELAYED_RELEASE_TABLET | Freq: Every day | ORAL | Status: DC
  Administered 2016-01-25 – 2016-01-26 (×2): 40 mg via ORAL
  Filled 2016-01-25 (×2): qty 1

## 2016-01-25 MED ORDER — SODIUM CHLORIDE 0.9 % IV SOLN
INTRAVENOUS | Status: DC
  Administered 2016-01-25: 03:00:00 via INTRAVENOUS

## 2016-01-25 MED ORDER — HEPARIN (PORCINE) IN NACL 100-0.45 UNIT/ML-% IJ SOLN
1250.0000 [IU]/h | INTRAMUSCULAR | Status: AC
Start: 2016-01-25 — End: 2016-01-27
  Administered 2016-01-25: 1000 [IU]/h via INTRAVENOUS
  Administered 2016-01-26: 1150 [IU]/h via INTRAVENOUS
  Administered 2016-01-27: 1250 [IU]/h via INTRAVENOUS
  Filled 2016-01-25 (×4): qty 250

## 2016-01-25 MED ORDER — ONDANSETRON HCL 4 MG/2ML IJ SOLN: 4.0000 mg | Freq: Four times a day (QID) | INTRAMUSCULAR | Status: DC | PRN

## 2016-01-25 MED ORDER — PROPRANOLOL HCL 10 MG PO TABS
10.0000 mg | ORAL_TABLET | Freq: Every day | ORAL | Status: DC
  Administered 2016-01-25 (×2): 10 mg via ORAL
  Filled 2016-01-25 (×2): qty 1

## 2016-01-25 MED ORDER — ACETAMINOPHEN 325 MG PO TABS
650.0000 mg | ORAL_TABLET | Freq: Four times a day (QID) | ORAL | Status: DC | PRN
  Administered 2016-01-25 – 2016-01-29 (×5): 650 mg via ORAL
  Filled 2016-01-25 (×5): qty 2

## 2016-01-25 MED ORDER — SODIUM CHLORIDE 0.9 % IV SOLN: INTRAVENOUS | Status: DC

## 2016-01-25 MED ORDER — ACETAMINOPHEN 650 MG RE SUPP: 650.0000 mg | Freq: Four times a day (QID) | RECTAL | Status: DC | PRN

## 2016-01-25 MED ORDER — FENTANYL CITRATE (PF) 100 MCG/2ML IJ SOLN
50.0000 ug | INTRAMUSCULAR | Status: DC | PRN
  Administered 2016-01-25: 50 ug via INTRAVENOUS
  Filled 2016-01-25: qty 2

## 2016-01-25 MED ORDER — DIGOXIN 125 MCG PO TABS
0.1250 mg | ORAL_TABLET | Freq: Every day | ORAL | Status: DC
  Administered 2016-01-26 – 2016-01-29 (×4): 0.125 mg via ORAL
  Filled 2016-01-25 (×4): qty 1

## 2016-01-25 MED ORDER — PROPRANOLOL HCL 20 MG PO TABS
20.0000 mg | ORAL_TABLET | Freq: Every day | ORAL | Status: DC
  Administered 2016-01-25 – 2016-01-26 (×2): 20 mg via ORAL
  Filled 2016-01-25 (×2): qty 1

## 2016-01-25 MED ORDER — DIGOXIN 0.25 MG/ML IJ SOLN
0.2500 mg | Freq: Once | INTRAMUSCULAR | Status: AC
  Administered 2016-01-25: 0.25 mg via INTRAVENOUS
  Filled 2016-01-25: qty 2

## 2016-01-25 MED ORDER — ONDANSETRON HCL 4 MG PO TABS: 4.0000 mg | ORAL_TABLET | Freq: Four times a day (QID) | ORAL | Status: DC | PRN

## 2016-01-25 NOTE — Progress Notes (Signed)
ANTICOAGULATION CONSULT NOTE - Initial Consult  Pharmacy Consult for Heparin  Indication: pulmonary embolus  Allergies  Allergen Reactions  . Epinephrine Palpitations   Patient Measurements: Height: 5\' 7"  (170.2 cm) Weight: 145 lb (65.8 kg) IBW/kg (Calculated) : 61.6  Vital Signs: Temp: 97.8 F (36.6 C) (08/06 2006) Temp Source: Oral (08/06 2006) BP: 124/97 (08/07 0000) Pulse Rate: 99 (08/07 0000)  Labs:  Recent Labs  01/24/16 2035  HGB 12.0  HCT 36.8  PLT 232  CREATININE 1.01*    Estimated Creatinine Clearance: 45.4 mL/min (by C-G formula based on SCr of 1.01 mg/dL).   Medical History: Past Medical History:  Diagnosis Date  . Bilateral lower extremity edema    Chronic, likely related to venous stasis   . DJD (degenerative joint disease), lumbosacral   . Dyslipidemia   . GERD (gastroesophageal reflux disease)   . History of hepatitis C virus infection    In remission  . 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 Osteopenia   . Paroxysmal atrial tachycardia ([place])   . Spondylolisthesis    Status post epidural injections 3 by neurosurgery  - Dr. Joya Salm   . Varicose veins of both legs with edema   . Vitamin D deficiency    Assessment: [name] with chest/rib pain, CT Angio is + for submassive PE, pt also has unilateral leg swelling but no DVT imaging yet, CBC/renal function good, PTA meds reviewed.   Patient received full dose Lovenox in the ED ~2200, will start heparin in 12 hours  Goal of Therapy:  Heparin level 0.3-0.7 units/ml Monitor platelets by anticoagulation protocol: Yes   Plan:  -Start heparin drip at 1000 units/hr today at 1000 (12 hours after full dose Lovenox administration) -1700 HL -Daily CBC/HL -Monitor for bleeding  Christy [name] 01/25/2016,12:10 AM

## 2016-01-25 NOTE — Progress Notes (Signed)
Patient ID: Christy [name], female   DOB: 1941-05-09, 75 y.o.   MRN: [id number]   Code PE call from ER doc to eMD  Patient not seen. T hi sis a phone opniion based on data given by ER doc and seen in chart  Patient with submassive PE  PESI score  - age 63, female. No hx of cancer, lung diseaes or heart disease per ER doc. Per chart Normocardic, normal bp and normal pulse ox on room air. Not confused per ER doc  - this is Class 2 of 5 PESI score with 77 points - low -intermediate risk for 30d mortality  Plan  - admit tele by triad   - rec IV heparin gtt for 3-5 days (highest risk for decomp during this time) and then noac v couamding - if declines call pcc for lysis eval (local v systemic)   - check trop and lactate for further risk strtification   Dr. Brand Males, M.D., [facility].C.P Pulmonary and Critical Care Medicine Staff Physician [facility] Pager: [phone], If no answer or between  15:00h - 7:00h: call [phone]  01/25/2016 12:09 AM

## 2016-01-25 NOTE — Progress Notes (Signed)
Patient ambulated on room air the bathroom and oxygen saturation down to 75%.  Took a couple of minutes after applying 2L oxygen for saturation to return to 97-99%.  Will continue to monitor.

## 2016-01-25 NOTE — Progress Notes (Signed)
  Echocardiogram 2D Echocardiogram has been performed.  Christy [name] 01/25/2016, 3:27 PM

## 2016-01-25 NOTE — Progress Notes (Signed)
**  Preliminary report by tech**  Bilateral lower extremity venous duplex completed. No evidence of deep or superficial vein thrombosis of the right lower extremity. Deep vein thrombosis discovered in the left popliteal, gastroc, posterior tibial, and peroneal veins. No evidence of Baker's cyst on the right or left.  01/25/16 10:47 AM Christy [name] RVT

## 2016-01-25 NOTE — Progress Notes (Signed)
[facility] for Heparin  Indication: pulmonary embolus  Allergies  Allergen Reactions  . Epinephrine Palpitations   Patient Measurements: Height: 5\' 7"  (170.2 cm) Weight: 158 lb 11.2 oz (72 kg) IBW/kg (Calculated) : 61.6  Vital Signs: Temp: 97.5 F (36.4 C) (08/07 1824) Temp Source: Oral (08/07 1824) BP: 117/61 (08/07 1824) Pulse Rate: 115 (08/07 1824)  Labs:  Recent Labs  01/23/16 2359 01/24/16 2035  01/25/16 0412 01/25/16 0420 01/25/16 0949 01/25/16 1616 01/25/16 1714  HGB  --  12.0  --   --  11.3*  --   --   --   HCT  --  36.8  --   --  34.9*  --   --   --   PLT  --  232  --   --  201  --   --   --   LABPROT 15.3*  --   --   --   --   --   --   --   INR 1.20  --   --   --   --   --   --   --   HEPARINUNFRC  --   --   --   --   --   --   --  0.27*  CREATININE  --  1.01*  --   --  0.76  --   --   --   TROPONINI  --   --   < > <0.03  --  <0.03 <0.03  --   < > = values in this interval not displayed.  Estimated Creatinine Clearance: 57.3 mL/min (by C-G formula based on SCr of 0.8 mg/dL).   Medical History: Past Medical History:  Diagnosis Date  . Bilateral lower extremity edema    Chronic, likely related to venous stasis   . DJD (degenerative joint disease), lumbosacral   . Dyslipidemia   . GERD (gastroesophageal reflux disease)   . History of hepatitis C virus infection    In remission  . 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 Osteopenia   . Paroxysmal atrial tachycardia ([place])   . PE (pulmonary embolism)   . Spondylolisthesis    Status post epidural injections 3 by neurosurgery  - Dr. Joya Salm   . Varicose veins of both legs with edema   . Vitamin D deficiency    Assessment: [name] with chest/rib pain, CT Angio is + for submassive PE and DVT.  Initial heparin level 0.27 on heparin infusion at 1000 units/hr. No issues with infusion or sxs of bleeding.   Goal of Therapy:  Heparin level 0.3-0.7 units/ml Monitor  platelets by anticoagulation protocol: Yes   Plan:  - Increase heparin to 1150 units/hr  - HL in am to assess  - Daily CBC/HL - Monitor for bleeding  Vincenza Hews, PharmD, BCPS 01/25/2016, 6:46 PM Pager: [phone]

## 2016-01-25 NOTE — ED Notes (Signed)
Admitting MD at bedside.

## 2016-01-25 NOTE — Progress Notes (Addendum)
PROGRESS NOTE  Christy [name] [id number] DOB: 01-04-1941 DOA: 01/24/2016 PCP: Wenda Low, MD  Brief History:  75 year old female with a history of paroxysmal tachycardia, anxiety, GERD, hyperlipidemia presents with 3-4 day history of increasing left greater than right lower extremity edema and right-sided chest discomfort. She stated that the right-sided chest pain was pleuritic and sharp in nature worsening by cough.  Initial chest x-ray in the emergency department revealed right lower lobe infiltrate. There was some initial concern for pneumonia, and the patient was given a dose of levofloxacin. However CT angio of the chest was performed and revealed bilateral pulmonary emboli with evidence of right heart strain. The patient was given a dose of Lovenox subcutaneous and admitted for further evaluation. The patient remains hemodynamically stable, but the patient is mildly hypoxic. She is stable on 2 L nasal cannula.  Assessment/Plan: Acute respiratory failure with hypoxia -Secondary to pulmonary emboli -01/24/2016 CT angiogram chest--bilateral pulmonary emboli affecting all lobes with evidence of right heart strain -Presently stable on 2 L  Pulmonary emboli -Appears to be provoked -Discontinue estrogen supplementation -Continue heparin IV for 3-5 days -Lower extremity duplex -Echocardiogram  Pleuritic chest pain  -secondary to PE -symptomatic treatment -troponins neg x 2 -personally reviewed CXR--no consolidation, no edema  Paroxysmal atrial tachycardia -continue propranolol -there is a question whether she has developed Afib noted on initial EKG -consult cardiology as this may effect duration of her anticoagulation--pt followed by Dr. Marlou Porch -repeat EKG -continue propranolol  Anxiety -continue propranolol   Disposition Plan:   Home in 3-4 days  Family Communication:   Husband updated at bedside 01/25/16--Total time 35 minutes--0855 to 0930  Consultants:   Cardiology  Code Status:  FULL  DVT Prophylaxis:  IV Heparin   Procedures: As Listed in Progress Note Above  Antibiotics: None    Subjective: Patient complains of right-sided chest discomfort. Denies any fevers, chills, shortness breath, nausea, vomiting or diarrhea. No abdominal pain. No dysuria or hematuria. Denies any headache or visual disturbance. No lightheadedness.  Objective: Vitals:   01/25/16 0100 01/25/16 0114 01/25/16 0204 01/25/16 0645  BP: 132/97  (!) 142/95 117/85  Pulse: 106 (!) 121 (!) 113 100  Resp: 14 18 16 18   Temp:   98.6 F (37 C) 98 F (36.7 C)  TempSrc:   Oral Oral  SpO2: 94% 96% 91% 95%  Weight:   72 kg (158 lb 11.2 oz)   Height:   5\' 7"  (1.702 m)     Intake/Output Summary (Last 24 hours) at 01/25/16 0927 Last data filed at 01/25/16 0605  Gross per 24 hour  Intake           410.83 ml  Output              850 ml  Net          -439.17 ml   Weight change:  Exam:   General:  Pt is alert, follows commands appropriately, not in acute distress  HEENT: No icterus, No thrush, No neck mass, [place]/AT  Cardiovascular: IRRR, S1/S2, no rubs, no gallops  Respiratory: Bibasilar crackles. "Auscultation. No wheeze  Abdomen: Soft/+BS, non tender, non distended, no guarding  Extremities: L>R LE edema, No lymphangitis, No petechiae, No rashes, no synovitis   Data Reviewed: I have personally reviewed following labs and imaging studies Basic Metabolic Panel:  Recent Labs Lab 01/24/16 2035 01/25/16 0420  NA 138 137  K 4.1 4.2  CL 104 107  CO2 23 23  GLUCOSE 103* 112*  BUN 9 8  CREATININE 1.01* 0.76  CALCIUM 9.0 8.4*   Liver Function Tests:  Recent Labs Lab 01/24/16 2035 01/25/16 0420  AST 22 18  ALT 17 15  ALKPHOS 46 41  BILITOT 1.2 0.9  PROT 6.6 5.6*  ALBUMIN 3.4* 3.0*   No results for input(s): LIPASE, AMYLASE in the last 168 hours. No results for input(s): AMMONIA in the last 168 hours. Coagulation Profile:  Recent Labs Lab  01/23/16 2359  INR 1.20   CBC:  Recent Labs Lab 01/24/16 2035 01/25/16 0420  WBC 10.6* 10.1  NEUTROABS 7.1  --   HGB 12.0 11.3*  HCT 36.8 34.9*  MCV 97.6 96.7  PLT 232 201   Cardiac Enzymes:  Recent Labs Lab 01/25/16 0023 01/25/16 0412  TROPONINI <0.03 <0.03   BNP: Invalid input(s): POCBNP CBG: No results for input(s): GLUCAP in the last 168 hours. HbA1C: No results for input(s): HGBA1C in the last 72 hours. Urine analysis: No results found for: COLORURINE, APPEARANCEUR, LABSPEC, PHURINE, GLUCOSEU, HGBUR, BILIRUBINUR, KETONESUR, PROTEINUR, UROBILINOGEN, NITRITE, LEUKOCYTESUR Sepsis Labs: @LABRCNTIP (procalcitonin:4,lacticidven:4) )No results found for this or any previous visit (from the past 240 hour(s)).   Scheduled Meds: . pantoprazole  40 mg Oral Daily  . propranolol  10 mg Oral QHS  . propranolol  20 mg Oral Daily   Continuous Infusions: . sodium chloride 50 mL/hr at 01/25/16 0235  . heparin      Procedures/Studies: Dg Chest 2 View  Result Date: 01/24/2016 CLINICAL DATA:  Right rib pain, cough, generalized weakness and low-grade fever. History of recent intestinal infection. EXAM: CHEST  2 VIEW COMPARISON:  PA and lateral chest 06/18/2015. FINDINGS: Patchy airspace disease is seen in the right lower lobe and there is a small right pleural effusion. The left lung is clear. Heart size is enlarged. No pneumothorax. Aortic atherosclerosis is identified. IMPRESSION: Patchy right lower lobe airspace disease and small right effusion worrisome for pneumonia. Cardiomegaly without edema. Atherosclerosis. Electronically Signed   By: Inge Rise M.D.   On: 01/24/2016 20:43   Ct Angio Chest Pe W And/or Wo Contrast  Result Date: 01/24/2016 CLINICAL DATA:  Acute onset of right lower chest pain, worse with deep inspiration. Initial encounter. EXAM: CT ANGIOGRAPHY CHEST WITH CONTRAST TECHNIQUE: Multidetector CT imaging of the chest was performed using the standard protocol  during bolus administration of intravenous contrast. Multiplanar CT image reconstructions and MIPs were obtained to evaluate the vascular anatomy. CONTRAST:  80 mL of Isovue 370 IV contrast COMPARISON:  Chest radiograph performed earlier today at 8:31 p.m. FINDINGS: There is pulmonary embolus tracking into all lobes of both lungs, though sparing the main pulmonary arteries. The RV/LV ratio of 1.26 is compatible with right heart strain and concern for submassive pulmonary embolus. Biatrial enlargement is noted. A small right pleural effusion is noted, with associated atelectasis. A few scattered apical opacities may reflect minimal interstitial edema. There is no evidence of pneumothorax. No masses are identified; no abnormal focal contrast enhancement is seen. The mediastinum is otherwise grossly unremarkable. No mediastinal lymphadenopathy is seen. No pericardial effusion is identified. Minimal scattered calcification is noted along the proximal great vessels and aortic arch. No axillary lymphadenopathy is seen. The visualized portions of the thyroid gland are unremarkable in appearance. The visualized portions of the liver and spleen are unremarkable. No acute osseous abnormalities are seen. Review of the MIP images confirms the above findings. IMPRESSION: 1. Positive for acute PE with CT  evidence of right heart strain (RV/LV Ratio = 1.26) consistent with at least submassive (intermediate risk) PE. Pulmonary embolus tracks into all lobes of both lungs, though sparing the main pulmonary arteries. The presence of right heart strain has been associated with an increased risk of morbidity and mortality. Please activate Code PE by paging [phone]. 2. Small right pleural effusion, with associated atelectasis. Few scattered apical opacities may reflect minimal interstitial edema. 3. Biatrial enlargement noted. Critical Value/emergent results were called by telephone at the time of interpretation on 01/24/2016 at 11:41  pm to Dr. Junius Creamer PA, who verbally acknowledged these results. Electronically Signed   By: Garald Balding M.D.   On: 01/24/2016 23:43    Tru Leopard, DO  [facility] Pager [phone]  If 7PM-7AM, please contact night-coverage [email or web address] Password [email or web address] 01/25/2016, 9:27 AM   LOS: 1 day

## 2016-01-25 NOTE — Consult Note (Signed)
CARDIOLOGY CONSULT NOTE   Patient ID: Christy [name] MRN: [id number], DOB/AGE: 1940-08-04   Admit date: 01/24/2016 Date of Consult: 01/25/2016  Primary Physician: Wenda Low, MD Primary Cardiologist: Dr Candee Furbish  Reason for consult:  A-fib with RVR  Problem List  Past Medical History:  Diagnosis Date  . Bilateral lower extremity edema    Chronic, likely related to venous stasis   . DJD (degenerative joint disease), lumbosacral   . Dyslipidemia   . GERD (gastroesophageal reflux disease)   . History of hepatitis C virus infection    In remission  . 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 Osteopenia   . Paroxysmal atrial tachycardia ([place])   . Spondylolisthesis    Status post epidural injections 3 by neurosurgery  - Dr. Joya Salm   . Varicose veins of both legs with edema   . Vitamin D deficiency     Past Surgical History:  Procedure Laterality Date  . NECK SURGERY    . TOE SURGERY     replaced joint     Allergies  Allergies  Allergen Reactions  . Epinephrine Palpitations    HPI   Christy [name] is a 75 y.o. female , the patient of Dr Marlou Porch, previously saw Dr. Ellyn Hack on 03/03/15 for the evaluation of bradycardia with long-standing paroxysmal atrial tachycardia dating back to 1971 with no associated syncope. Beta blockers for years. On September 6 she was at the beach with her husband, Sonia Side, had an episode of feeling tired, shortness of breath, sleepy, fatigued and checked her heart rate pulse was in the 40s. At that time she had stopped taking her digoxin. She saw Dr. Deforest Hoyles on September 8. Continue the beta blocker. An EKG performed then show first-degree AV block PVC.  Rare bursts of paroxysmal atrial tachycardia have been throughout her life. Cannot recall the last time she had a long episode.  She last saw Dr Marlou Porch in 09/2014 when she reported occasional palpitations during stressful episodes, on very low-dose Inderal.   She presented to the ED on 01/25/2016  with left lower extremity swelling and pain and also started developing some chest pain. Patient states approximately 2 weeks ago patient had multiple episodes of diarrhea for 2 days following which patient's primary care physician had treated her with Cipro and Flagyl which she took for almost 7 days. Since then patient has been feeling weak and tired. Has not been ambulatory as usual. Last 3-4 days patient noticed increasing left lower extremity swelling and started also having some right-sided chest pain. Chest pain increased in the ER. Pleuritic in nature and nonradiating present even at rest. Has been having some nonproductive cough. CT angiogram of the chest shows bilateral PE and on-call pulmonologist was consulted and felt patient was not a candidate for thrombolysis for now.  ECG showed new a-fib with RVR.   Inpatient Medications  . pantoprazole  40 mg Oral Daily  . propranolol  10 mg Oral QHS  . propranolol  20 mg Oral Daily   . heparin 1,000 Units/hr (01/25/16 1104)    Family History Family History  Problem Relation Age of Onset  . Heart Problems Mother      Social History Social History   Social History  . Marital status: Married    Spouse name: N/A  . Number of children: N/A  . Years of education: N/A   Occupational History  . Not on file.   Social History Main Topics  . Smoking  status: Never Smoker  . Smokeless tobacco: Never Used  . Alcohol use Yes     Comment: wine daily  . Drug use: No  . Sexual activity: Not on file   Other Topics Concern  . Not on file   Social History Narrative   She is a married mother of 1. Lives with her husband. 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 She has never smoked and drinks up to 10 glasses of wine or so week.   She usually exercises roughly 3 days a week doing yoga and plays golf. She is mostly limited by her "time constraints "     Review of Systems  General:  No chills, fever, night sweats or weight changes.    Cardiovascular:  No chest pain, dyspnea on exertion, edema, orthopnea, palpitations, paroxysmal nocturnal dyspnea. Dermatological: No rash, lesions/masses Respiratory: No cough, dyspnea Urologic: No hematuria, dysuria Abdominal:   No nausea, vomiting, diarrhea, bright red blood per rectum, melena, or hematemesis Neurologic:  No visual changes, wkns, changes in mental status. All other systems reviewed and are otherwise negative except as noted above.  Physical Exam  Blood pressure 117/85, pulse 100, temperature 98 F (36.7 C), temperature source Oral, resp. rate 18, height 5\' 7"  (1.702 m), weight 158 lb 11.2 oz (72 kg), SpO2 95 %.  General: Pleasant, NAD Psych: Normal affect. Neuro: Alert and oriented X 3. Moves all extremities spontaneously. HEENT: Normal  Neck: Supple without bruits or JVD. Lungs:  Resp regular and unlabored, CTA. Heart: RRR no s3, s4, or murmurs. Abdomen: Soft, non-tender, non-distended, BS + x 4.  Extremities: No clubbing, cyanosis or edema. DP/PT/Radials 2+ and equal bilaterally.  Labs   Recent Labs  01/25/16 0023 01/25/16 0412 01/25/16 0949  TROPONINI <0.03 <0.03 <0.03   Lab Results  Component Value Date   WBC 10.1 01/25/2016   HGB 11.3 (L) 01/25/2016   HCT 34.9 (L) 01/25/2016   MCV 96.7 01/25/2016   PLT 201 01/25/2016    Recent Labs Lab 01/25/16 0420  NA 137  K 4.2  CL 107  CO2 23  BUN 8  CREATININE 0.76  CALCIUM 8.4*  PROT 5.6*  BILITOT 0.9  ALKPHOS 41  ALT 15  AST 18  GLUCOSE 112*   No results found for: CHOL, HDL, LDLCALC, TRIG No results found for: DDIMER Invalid input(s): POCBNP  Radiology/Studies  Dg Chest 2 View  Result Date: 01/24/2016 CLINICAL DATA:  Right rib pain, cough, generalized weakness and low-grade fever. History of recent intestinal infection. EXAM: CHEST  2 VIEW COMPARISON:  PA and lateral chest 06/18/2015. FINDINGS: Patchy airspace disease is seen in the right lower lobe and there is a small right pleural  effusion. The left lung is clear. Heart size is enlarged. No pneumothorax. Aortic atherosclerosis is identified. IMPRESSION: Patchy right lower lobe airspace disease and small right effusion worrisome for pneumonia. Cardiomegaly without edema. Atherosclerosis. Electronically Signed   By: Inge Rise M.D.   On: 01/24/2016 20:43   Ct Angio Chest Pe W And/or Wo Contrast  Result Date: 01/24/2016 CLINICAL DATA:  Acute onset of right lower chest pain, worse with deep inspiration. Initial encounter. EXAM: CT ANGIOGRAPHY CHEST WITH CONTRAST TECHNIQUE: Multidetector CT imaging of the chest was performed using the standard protocol during bolus administration of intravenous contrast. Multiplanar CT image reconstructions and MIPs were obtained to evaluate the vascular anatomy. CONTRAST:  80 mL of Isovue 370 IV contrast COMPARISON:  Chest radiograph performed earlier today at 8:31 p.m.  FINDINGS: There is pulmonary embolus tracking into all lobes of both lungs, though sparing the main pulmonary arteries. The RV/LV ratio of 1.26 is compatible with right heart strain and concern for submassive pulmonary embolus. Biatrial enlargement is noted. A small right pleural effusion is noted, with associated atelectasis. A few scattered apical opacities may reflect minimal interstitial edema. There is no evidence of pneumothorax. No masses are identified; no abnormal focal contrast enhancement is seen. The mediastinum is otherwise grossly unremarkable. No mediastinal lymphadenopathy is seen. No pericardial effusion is identified. Minimal scattered calcification is noted along the proximal great vessels and aortic arch. No axillary lymphadenopathy is seen. The visualized portions of the thyroid gland are unremarkable in appearance. The visualized portions of the liver and spleen are unremarkable. No acute osseous abnormalities are seen. Review of the MIP images confirms the above findings. IMPRESSION: 1. Positive for acute PE with CT  evidence of right heart strain (RV/LV Ratio = 1.26) consistent with at least submassive (intermediate risk) PE. Pulmonary embolus tracks into all lobes of both lungs, though sparing the main pulmonary arteries. The presence of right heart strain has been associated with an increased risk of morbidity and mortality. Please activate Code PE by paging [phone]. 2. Small right pleural effusion, with associated atelectasis. Few scattered apical opacities may reflect minimal interstitial edema. 3. Biatrial enlargement noted. Critical Value/emergent results were called by telephone at the time of interpretation on 01/24/2016 at 11:41 pm to Dr. Junius Creamer PA, who verbally acknowledged these results. Electronically Signed   By: Garald Balding M.D.   On: 01/24/2016 23:43   Echocardiogram - 03/2015  Study Conclusions  - Left ventricle: The cavity size was normal. Wall thickness was   normal. Systolic function was normal. The estimated ejection   fraction was in the range of 50% to 55%. Moderate hypokinesis of   the midanteroseptal myocardium. - Left atrium: The atrium was severely dilated. - Right atrium: The atrium was moderately dilated. - Pulmonary arteries: Systolic pressure was moderately increased.   PA peak pressure: 44 mm Hg (S).  New echo is pending  ECG: a-fib with RVR and ventricular rate 124 BPM, nonspecific ST-T wave abnormalities Previously SR with 1. AVB in 03/2015   Nuclear stress test: 03/2015  Upsloping ST segment depression ST segment depression of 0.5 mm was noted during stress in the I, II and III leads.   This is a low risk study.  1. No evidence for ischemia or infarction.  2. Frequent PVCs noted throughout study (pre-exercise, during exercise, and post-exercise).  3. Study not gated because of PVCs.  4. Overall low risk study.     ASSESSMENT AND PLAN  75 year old female  1. New-onset atrial fibrillation with rapid ventricular response, the patient has a prior  medical history of paroxysmal atrial tachycardia but no documented atrial fibrillation. She has been started on IV heparin for pulmonary embolism several will require outpatient anticoagulation.   With regards to rate control versus rhythm control I would await results of echocardiogram and decide based on that. This is possibly a response to pulmonary embolism I might cardiovert on its own. Her blood pressure is rather low I would add digoxin for rate control for now. The patient states that she has been taking digoxin since 1972 and was doing great until a year ago when it was discontinued before because of bradycardia. She has been experiencing episodes of tachycardia ever since then unable to tell if it was regular or irregular.  I will start with IV digoxin today followed by by mouth tomorrow, her creatinine and electrolytes are normal.  CHADS-VASc score is 3.   Signed, Ena Dawley, MD, [facility] 01/25/2016, 11:21 AM

## 2016-01-25 NOTE — H&P (Signed)
History and Physical    Christy [name] [id number] DOB: 09/14/1940 DOA: 01/24/2016  PCP: Wenda Low, MD  Patient coming from: Home.  Chief Complaint: Left leg swelling and chest pain.  HPI: Christy [name] is a 75 y.o. female with paroxysmal atrial tachycardia on Inderal presents to the ER because of left lower extremity swelling and pain and also started developing some chest pain. Patient states approximately 2 weeks ago patient had multiple episodes of diarrhea for 2 days following which patient's primary care physician had treated her with Cipro and Flagyl which she took for almost 7 days. Since then patient has been feeling weak and tired. Has not been ambulatory as usual. Last 3-4 days patient noticed increasing left lower extremity swelling and started also having some right-sided chest pain. Chest pain increased in the ER. Pleuritic in nature and nonradiating present even at rest. Has been having some nonproductive cough. CT angiogram of the chest shows bilateral PE and on-call pulmonologist was consulted and felt patient was not a candidate for thrombolysis for now. Patient will be admitted for further observation. Patient is on estrogen patch for hot flashes but has been on it for many years. Denies any recent travel or family history of clots. Denies any recent surgery.   ED Course: Patient was initially given Lovenox for DVT.  Review of Systems: As per HPI, rest all negative.   Past Medical History:  Diagnosis Date  . Bilateral lower extremity edema    Chronic, likely related to venous stasis   . DJD (degenerative joint disease), lumbosacral   . Dyslipidemia   . GERD (gastroesophageal reflux disease)   . History of hepatitis C virus infection    In remission  . 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 Osteopenia   . Paroxysmal atrial tachycardia ([place])   . Spondylolisthesis    Status post epidural injections 3 by neurosurgery  - Dr. Joya Salm   . Varicose veins of both  legs with edema   . Vitamin D deficiency     Past Surgical History:  Procedure Laterality Date  . NECK SURGERY    . TOE SURGERY     replaced joint     reports that she has never smoked. She has never used smokeless tobacco. She reports that she drinks alcohol. She reports that she does not use drugs.  Allergies  Allergen Reactions  . Epinephrine Palpitations    Family History  Problem Relation Age of Onset  . Heart Problems Mother     Prior to Admission medications   Medication Sig Start Date End Date Taking? Authorizing Provider  estradiol-norethindrone [facility]) 0.05-0.25 MG/DAY Place 1 patch onto the skin 2 (two) times a week.   Yes Historical Provider, MD  pantoprazole (PROTONIX) 40 MG tablet Take 40 mg by mouth daily.   Yes Historical Provider, MD  propranolol (INDERAL) 20 MG tablet Take 1 tablet (20 mg total) by mouth every morning. And 1/2 tablet (10 mg) at bedtime 11/09/15  Yes Jerline Pain, MD    Physical Exam: Vitals:   01/24/16 2006 01/24/16 2100 01/24/16 2300 01/25/16 0000  BP: (!) 133/115 (!) 127/102 123/85 124/97  Pulse: 68 120 99 99  Resp: 20 16  18   Temp: 97.8 F (36.6 C)     TempSrc: Oral     SpO2: 97% 96% 96% 95%  Weight: 145 lb (65.8 kg)     Height: 5\' 7"  (1.702 m)  Constitutional: Not in distress. Vitals:   01/24/16 2006 01/24/16 2100 01/24/16 2300 01/25/16 0000  BP: (!) 133/115 (!) 127/102 123/85 124/97  Pulse: 68 120 99 99  Resp: 20 16  18   Temp: 97.8 F (36.6 C)     TempSrc: Oral     SpO2: 97% 96% 96% 95%  Weight: 145 lb (65.8 kg)     Height: 5\' 7"  (1.702 m)      Eyes: Anicteric no pallor. ENMT: No discharge from the ears eyes nose and mouth. Neck: No mass felt. No JVD appreciated. Respiratory: No rhonchi or crepitations. Cardiovascular: S1-S2 heard. Abdomen: Soft nontender bowel sounds present. Musculoskeletal: Left lower extremity swelling extending from the foot up to the knee. Skin: Mild skin changes in the left  lower extremity. Neurologic: Alert awake oriented to time place and person. Moves all extremities. Psychiatric: Appears normal.   Labs on Admission: I have personally reviewed following labs and imaging studies  CBC:  Recent Labs Lab 01/24/16 2035  WBC 10.6*  NEUTROABS 7.1  HGB 12.0  HCT 36.8  MCV 97.6  PLT [id number]   Basic Metabolic Panel:  Recent Labs Lab 01/24/16 2035  NA 138  K 4.1  CL 104  CO2 23  GLUCOSE 103*  BUN 9  CREATININE 1.01*  CALCIUM 9.0   GFR: Estimated Creatinine Clearance: 45.4 mL/min (by C-G formula based on SCr of 1.01 mg/dL). Liver Function Tests:  Recent Labs Lab 01/24/16 2035  AST 22  ALT 17  ALKPHOS 46  BILITOT 1.2  PROT 6.6  ALBUMIN 3.4*   No results for input(s): LIPASE, AMYLASE in the last 168 hours. No results for input(s): AMMONIA in the last 168 hours. Coagulation Profile:  Recent Labs Lab 01/23/16 2359  INR 1.20   Cardiac Enzymes: No results for input(s): CKTOTAL, CKMB, CKMBINDEX, TROPONINI in the last 168 hours. BNP (last 3 results) No results for input(s): PROBNP in the last 8760 hours. HbA1C: No results for input(s): HGBA1C in the last 72 hours. CBG: No results for input(s): GLUCAP in the last 168 hours. Lipid Profile: No results for input(s): CHOL, HDL, LDLCALC, TRIG, CHOLHDL, LDLDIRECT in the last 72 hours. Thyroid Function Tests: No results for input(s): TSH, T4TOTAL, FREET4, T3FREE, THYROIDAB in the last 72 hours. Anemia Panel: No results for input(s): VITAMINB12, FOLATE, FERRITIN, TIBC, IRON, RETICCTPCT in the last 72 hours. Urine analysis: No results found for: COLORURINE, APPEARANCEUR, LABSPEC, PHURINE, GLUCOSEU, HGBUR, BILIRUBINUR, KETONESUR, PROTEINUR, UROBILINOGEN, NITRITE, LEUKOCYTESUR Sepsis Labs: @LABRCNTIP (procalcitonin:4,lacticidven:4) )No results found for this or any previous visit (from the past 240 hour(s)).   Radiological Exams on Admission: Dg Chest 2 View  Result Date: 01/24/2016 CLINICAL  DATA:  Right rib pain, cough, generalized weakness and low-grade fever. History of recent intestinal infection. EXAM: CHEST  2 VIEW COMPARISON:  PA and lateral chest 06/18/2015. FINDINGS: Patchy airspace disease is seen in the right lower lobe and there is a small right pleural effusion. The left lung is clear. Heart size is enlarged. No pneumothorax. Aortic atherosclerosis is identified. IMPRESSION: Patchy right lower lobe airspace disease and small right effusion worrisome for pneumonia. Cardiomegaly without edema. Atherosclerosis. Electronically Signed   By: Inge Rise M.D.   On: 01/24/2016 20:43   Ct Angio Chest Pe W And/or Wo Contrast  Result Date: 01/24/2016 CLINICAL DATA:  Acute onset of right lower chest pain, worse with deep inspiration. Initial encounter. EXAM: CT ANGIOGRAPHY CHEST WITH CONTRAST TECHNIQUE: Multidetector CT imaging of the chest was performed using the standard protocol  during bolus administration of intravenous contrast. Multiplanar CT image reconstructions and MIPs were obtained to evaluate the vascular anatomy. CONTRAST:  80 mL of Isovue 370 IV contrast COMPARISON:  Chest radiograph performed earlier today at 8:31 p.m. FINDINGS: There is pulmonary embolus tracking into all lobes of both lungs, though sparing the main pulmonary arteries. The RV/LV ratio of 1.26 is compatible with right heart strain and concern for submassive pulmonary embolus. Biatrial enlargement is noted. A small right pleural effusion is noted, with associated atelectasis. A few scattered apical opacities may reflect minimal interstitial edema. There is no evidence of pneumothorax. No masses are identified; no abnormal focal contrast enhancement is seen. The mediastinum is otherwise grossly unremarkable. No mediastinal lymphadenopathy is seen. No pericardial effusion is identified. Minimal scattered calcification is noted along the proximal great vessels and aortic arch. No axillary lymphadenopathy is seen. The  visualized portions of the thyroid gland are unremarkable in appearance. The visualized portions of the liver and spleen are unremarkable. No acute osseous abnormalities are seen. Review of the MIP images confirms the above findings. IMPRESSION: 1. Positive for acute PE with CT evidence of right heart strain (RV/LV Ratio = 1.26) consistent with at least submassive (intermediate risk) PE. Pulmonary embolus tracks into all lobes of both lungs, though sparing the main pulmonary arteries. The presence of right heart strain has been associated with an increased risk of morbidity and mortality. Please activate Code PE by paging [phone]. 2. Small right pleural effusion, with associated atelectasis. Few scattered apical opacities may reflect minimal interstitial edema. 3. Biatrial enlargement noted. Critical Value/emergent results were called by telephone at the time of interpretation on 01/24/2016 at 11:41 pm to Dr. Junius Creamer PA, who verbally acknowledged these results. Electronically Signed   By: Garald Balding M.D.   On: 01/24/2016 23:43    EKG: Independently reviewed. Tachycardia probably paroxysmal atrial tachycardia but EKGs reading as A. fib with RVR. We will need to discuss with cardiologist.  Assessment/Plan Principal Problem:   Pulmonary emboli ([place]) Active Problems:   PAT (paroxysmal atrial tachycardia) ([place])   Pulmonary embolism with acute cor pulmonale ([place])    1. Bilateral acute pulmonary embolism - CT is read as strain pattern and pulmonologist was consulted and at this time Dr. Chase Caller on-call pulmonologist has felt patient is not a candidate for TPA but will need close monitoring and if there is any decompensation to reconsult pulmonary for thrombolysis. Patient is placed on heparin per pharmacy. Cycle cardiac markers check lactic acid levels. Check 2-D echo. Patient is on estrogen patch which I advised patient to discontinue. Check Dopplers of the lower extremity. 2. Tachycardia with  history of paroxysmal atrial tachycardia - continue Inderal. There is some concern for A. fib. Will need to discuss with cardiology.   DVT prophylaxis: Heparin. Code Status: Full code.  Family Communication: No family at the bedside.  Disposition Plan: Home.  Consults called: ER physician had discussed with pulmonologist.  Admission status: Observation. Telemetry.    Rise Patience MD [facility] Pager [phone].  If 7PM-7AM, please contact night-coverage [email or web address] Password [facility]  01/25/2016, 12:47 AM

## 2016-01-25 NOTE — ED Notes (Signed)
Attempted to call report

## 2016-01-26 DIAGNOSIS — I471 Supraventricular tachycardia: Secondary | ICD-10-CM

## 2016-01-26 DIAGNOSIS — I4891 Unspecified atrial fibrillation: Secondary | ICD-10-CM

## 2016-01-26 DIAGNOSIS — I2609 Other pulmonary embolism with acute cor pulmonale: Principal | ICD-10-CM

## 2016-01-26 LAB — HEPARIN LEVEL (UNFRACTIONATED): Heparin Unfractionated: 0.32 IU/mL (ref 0.30–0.70)

## 2016-01-26 MED ORDER — PROPRANOLOL HCL 40 MG PO TABS
40.0000 mg | ORAL_TABLET | Freq: Two times a day (BID) | ORAL | Status: DC
  Administered 2016-01-26 – 2016-01-28 (×4): 40 mg via ORAL
  Filled 2016-01-26 (×4): qty 1

## 2016-01-26 NOTE — Progress Notes (Signed)
Patient Profile: 75 y/o female admitted for bilateral acute pulmonary embolism/ left LE DVT, also found to be in new onset atrial fibrillation w/ RVR. Her CHA2DS2 VASc score is 3.   Subjective: Still on supplemental O2. No significant CP. Still bothered by dry cough. No hemoptysis.  Objective: Vital signs in last 24 hours: Temp:  [97.5 F (36.4 C)-98.1 F (36.7 C)] 97.5 F (36.4 C) (08/08 0719) Pulse Rate:  [67-132] 67 (08/08 0719) Resp:  [18-20] 18 (08/08 0719) BP: (109-131)/(61-90) 115/90 (08/08 0719) SpO2:  [97 %-100 %] 97 % (08/08 0719) Weight:  [147 lb 6.4 oz (66.9 kg)] 147 lb 6.4 oz (66.9 kg) (08/08 0719) Last BM Date: 01/25/16  Intake/Output from previous day: 08/07 0701 - 08/08 0700 In: 1148.3 [P.O.:942; I.V.:206.3] Out: 2501 [Urine:2500; Stool:1] Intake/Output this shift: Total I/O In: 360 [P.O.:360] Out: 800 [Urine:800]  Medications Current Facility-Administered Medications  Medication Dose Route Frequency Provider Last Rate Last Dose  . acetaminophen (TYLENOL) tablet 650 mg  650 mg Oral Q6H PRN Rise Patience, MD   650 mg at 01/25/16 2156   Or  . acetaminophen (TYLENOL) suppository 650 mg  650 mg Rectal Q6H PRN Rise Patience, MD      . digoxin (LANOXIN) tablet 0.125 mg  0.125 mg Oral Daily [name], MD      . fentaNYL (SUBLIMAZE) injection 50 mcg  50 mcg Intravenous Q2H PRN Rise Patience, MD   50 mcg at 01/25/16 0055  . heparin ADULT infusion 100 units/mL (25000 units/247mL sodium chloride 0.45%)  1,150 Units/hr Intravenous Continuous Orson Eva, MD 11.5 mL/hr at 01/25/16 1843 1,150 Units/hr at 01/25/16 1843  . ondansetron (ZOFRAN) tablet 4 mg  4 mg Oral Q6H PRN Rise Patience, MD       Or  . ondansetron [facility]) injection 4 mg  4 mg Intravenous Q6H PRN Rise Patience, MD      . pantoprazole (PROTONIX) EC tablet 40 mg  40 mg Oral Daily Rise Patience, MD   40 mg at 01/25/16 [id number]  . propranolol (INDERAL) tablet 10 mg  10  mg Oral QHS Rise Patience, MD   10 mg at 01/25/16 2156  . propranolol (INDERAL) tablet 20 mg  20 mg Oral Daily Rise Patience, MD   20 mg at 01/25/16 [id number]    PE: General appearance: alert, cooperative and no distress Neck: no carotid bruit and no JVD Lungs: decreased BS at the bases bilaterally Heart: irregularly irregular rhythm and tachy rate Extremities: no LEE Pulses: 2+ and symmetric Skin: warm and dry Neurologic: Grossly normal  Lab Results:   Recent Labs  01/24/16 2035 01/25/16 0420  WBC 10.6* 10.1  HGB 12.0 11.3*  HCT 36.8 34.9*  PLT 232 201   BMET  Recent Labs  01/24/16 2035 01/25/16 0420  NA 138 137  K 4.1 4.2  CL 104 107  CO2 23 23  GLUCOSE 103* 112*  BUN 9 8  CREATININE 1.01* 0.76  CALCIUM 9.0 8.4*   PT/INR  Recent Labs  01/23/16 2359  LABPROT 15.3*  INR 1.20   Cardiac Panel (last 3 results)  Recent Labs  01/25/16 0412 01/25/16 0949 01/25/16 1616  TROPONINI <0.03 <0.03 <0.03    Studies/Results: 2D Echo 01/25/16  Study Conclusions  - Left ventricle: The cavity size was normal. There was mild focal   basal hypertrophy of the septum. Systolic function was normal.   The estimated ejection fraction was in the range  of 55% to 60%.   Wall motion was normal; there were no regional wall motion   abnormalities. - Mitral valve: There was moderate regurgitation. - Left atrium: The atrium was severely dilated. - Right ventricle: The cavity size was mildly dilated. - Right atrium: The atrium was severely dilated. - Tricuspid valve: There was moderate regurgitation. - Pulmonary arteries: Systolic pressure was moderately increased.   PA peak pressure: 58 mm Hg (S).  Impressions:  - Normal LV systolic function; severe biatrial enlargement; mild   RVE; moderate MR and TR; moderately elevated pulmonary pressure.   Assessment/Plan  Principal Problem:   Pulmonary emboli ([facility]) Active Problems:   PAT (paroxysmal atrial  tachycardia) ([facility])   Pulmonary embolism with acute cor pulmonale ([facility])   Acute pulmonary embolism ([facility])   1. New Onset Atrial Fibrillation w/ RVR: in the setting of bilateral PE.  Ventricular rate remains poorly controlled in the 110s-120s resting. BP is stable. Patient is currently asymptomatic at rest. 2D echo shows normal LVEF of 55-60%, thus ok to use PO Cardizem for rate control. Continue BB + digoxin. May consider changing BB from propranolol to metoprolol.  Will defer decision regarding addition of rhythm control agent to MD. Continue IV heparin. Given bilateral PE and high risk for CVA given CHA2DS2 VASc score of 3, she will require transition to oral anticoagulation.   2. Bilateral PE/ left LE DVT: currently on IV heparin with plans to transition to oral a/c.  2D echo shows normal LV systolic function; severe biatrial enlargement; mild RVE; moderate MR and TR; moderately elevated pulmonary pressure.   LOS: 1 day   Brittainy M. Ladoris Gene 01/26/2016 8:34 AM  The patient was seen, examined and discussed with Brittainy M. Rosita Fire, PA-C and I agree with the above.    75 year old female admitted for left lower extremity DVT and bilateral pulmonary embolism and found to be in atrial fibrillation with RVR, previously documented atrial tachycardias, however the patient states that over the last year she has been having palpitations with irregular heartbeat are frequently. She has been started on IV heparin for pulmonary embolism several will require outpatient anticoagulation.  CHADS-VASc score is 3. The patient states that she has been taking digoxin since 1972 and was doing great until a year ago when it was discontinued before because of bradycardia.   In the settings of acute pulmonary embolism I would prefer rate control, echocardiogram shows normal LV EF however severe left atrial enlargement and she has possibly been in atrial fibrillation for a while now. I started her on digoxin  yesterday her heart rate continues to be elevated, she would prefer to stay on propranolol as she has been taking it for a while I would increase the dose to better control her heart rate. If heart rate difficult to manage we will consider TEE and cardioversion, or alternatively just cardioversion after 4 weeks of full anticoagulation.  Ena Dawley 01/26/2016

## 2016-01-26 NOTE — Progress Notes (Signed)
[place] for Heparin  Indication: pulmonary embolus / left DVT  Allergies  Allergen Reactions  . Epinephrine Palpitations   Patient Measurements: Height: 5\' 7"  (170.2 cm) Weight: 147 lb 6.4 oz (66.9 kg) IBW/kg (Calculated) : 61.6  Vital Signs: Temp: 97.5 F (36.4 C) (08/08 0719) Temp Source: Oral (08/08 0719) BP: 115/90 (08/08 0719) Pulse Rate: 67 (08/08 0719)  Labs:  Recent Labs  01/23/16 2359 01/24/16 2035  01/25/16 0412 01/25/16 0420 01/25/16 0949 01/25/16 1616 01/25/16 1714 01/26/16 0235  HGB  --  12.0  --   --  11.3*  --   --   --   --   HCT  --  36.8  --   --  34.9*  --   --   --   --   PLT  --  232  --   --  201  --   --   --   --   LABPROT 15.3*  --   --   --   --   --   --   --   --   INR 1.20  --   --   --   --   --   --   --   --   HEPARINUNFRC  --   --   --   --   --   --   --  0.27* 0.32  CREATININE  --  1.01*  --   --  0.76  --   --   --   --   TROPONINI  --   --   < > <0.03  --  <0.03 <0.03  --   --   < > = values in this interval not displayed.  Estimated Creatinine Clearance: 57.3 mL/min (by C-G formula based on SCr of 0.8 mg/dL).   Assessment: [name] with chest/rib pain, CT Angio is + for submassive PE and DVT.   HL therapeutic this AM at 0.32  Goal of Therapy:  Heparin level 0.3-0.7 units/ml Monitor platelets by anticoagulation protocol: Yes   Plan:  - Increase heparin to 1250 units/hr  - Daily CBC/HL - Monitor for bleeding  Thank you Anette Guarneri, PharmD [phone] 01/26/2016, 10:13 AM

## 2016-01-26 NOTE — Care Management Important Message (Signed)
Important Message  Patient Details  Name: Christy [name] MRN: [id number] Date of Birth: 06/20/1941   Medicare Important Message Given:  Yes    Loann Quill 01/26/2016, 8:29 AM

## 2016-01-26 NOTE — Consult Note (Signed)
[facility] CM Inpatient Consult   01/26/2016  Christy [name] 10/29/1940 [id number]  Patient screened for potential [facility] Care Management services. Patient is eligible for [facility] Care Management services under patient's [employer] Medicare  plan.  Met with the patient to introduce self and care management benefits of plan. Chart review reveals patient admitted on 01/25/2016 with left lower extremity swelling and pain and also started developing some chest pain.  Patient and family at bedside.  Patient seemed unsure of need for post hospital follow up. A brochure with contact information was provided.   Please place a [facility] Care Management consult if needs identified or for questions contact:   Victoria Brewer, RN BSN CCM [facility] Liaison  [phone] business mobile phone Toll free office [phone]  

## 2016-01-26 NOTE — Progress Notes (Signed)
PROGRESS NOTE  Christy [name] [id number] DOB: 1941-02-01 DOA: 01/24/2016 PCP: Wenda Low, MD  Brief History:  75 year old female with a history of paroxysmal tachycardia, anxiety, GERD, hyperlipidemia presents with 3-4 day history of increasing left greater than right lower extremity edema and right-sided chest discomfort. She stated that the right-sided chest pain was pleuritic and sharp in nature worsening by cough.  Initial chest x-ray in the emergency department revealed right lower lobe infiltrate. There was some initial concern for pneumonia, and the patient was given a dose of levofloxacin. However CT angio of the chest was performed and revealed bilateral pulmonary emboli with evidence of right heart strain. The patient was given a dose of Lovenox subcutaneous in  ED and admitted for further evaluation. The patient was ultimately started on intravenous heparin.  The patient remains hemodynamically stable, but the patient is mildly hypoxic. She is stable on 2 L nasal cannula.Unfortunately, the patient developed atrial fibrillation with RVR. Cardiology was consulted and started the pt on digoxin.  Notably, the patient has been on hormone replacement therapy with CombiPatch, and she states that she was essentially "couch potato" for the past week prior to admission.  Assessment/Plan: Acute respiratory failure with hypoxia -Secondary to pulmonary emboli -01/24/2016 CT angiogram chest--bilateral pulmonary emboli affecting all lobes with evidence of right heart strain -Presently stable on 2 L--saturation 97-100% -wean off oxygen for sat > 92%  Pulmonary emboli/LLE DVT -Appears to be provoked by hormone replacement therapy in setting of recent inactivity -Discontinue estrogen supplementation -Continue heparin IV  D#2 -Lower extremity duplex--LLE DVT--below knee -Echocardiogram--EF 55-60%, PEEP 58, moderate TR, MR, mild RV dilatation -lactic acid normal  Pleuritic chest  pain  -secondary to PE -symptomatic treatment -troponins neg x 3 - CXR--no consolidation, no edema -improving  Paroxysmal atrial tachycardia-->AFib with RVR -continue propranolol -there is a question whether she has developed Afib noted on initial EKG -Appreciate cardiology followup -repeat EKG -continue propranolol  Anxiety -continue propranolol   Disposition Plan:   Home in 2-3 days  Family Communication:   Husband updated at bedside 01/26/16--Total time spent 35 minutes.  Greater than 50% spent face to face counseling and coordinating care.   Consultants:  Cardiology  Code Status:  FULL  DVT Prophylaxis:  IV Heparin   Procedures: As Listed in Progress Note Above  Antibiotics: None    Subjective: Patient says that she is breathing better and her pleuritic chest pain is improved. Denies any fevers, chills, SOB, HA, abd pain, nausea, vomiting, diarrhea. No dysuria or hematuria.  No hematochezia or melena.   Objective: Vitals:   01/25/16 1824 01/25/16 2106 01/25/16 2351 01/26/16 0719  BP: 117/61 131/73 109/71 115/90  Pulse: (!) 115 (!) 113 (!) 132 67  Resp: 18 18 20 18   Temp: 97.5 F (36.4 C) 97.8 F (36.6 C)  97.5 F (36.4 C)  TempSrc: Oral Oral  Oral  SpO2: 100% 98% 98% 97%  Weight:    66.9 kg (147 lb 6.4 oz)  Height:        Intake/Output Summary (Last 24 hours) at 01/26/16 1301 Last data filed at 01/26/16 [id number]  Gross per 24 hour  Intake           788.26 ml  Output             2101 ml  Net         -1312.74 ml   Weight change:  Exam:   General:  Pt  is alert, follows commands appropriately, not in acute distress  HEENT: No icterus, No thrush, No neck mass, [place]/AT  Cardiovascular: RRR, S1/S2, no rubs, no gallops  Respiratory: With basilar crackles. Left clear to auscultation. No wheezing   Abdomen: Soft/+BS, non tender, non distended, no guarding  Extremities: 2 + LLE, 1 + RLE edema, No lymphangitis, No petechiae, No rashes, no  synovitis   Data Reviewed: I have personally reviewed following labs and imaging studies Basic Metabolic Panel:  Recent Labs Lab 01/24/16 2035 01/25/16 0420  NA 138 137  K 4.1 4.2  CL 104 107  CO2 23 23  GLUCOSE 103* 112*  BUN 9 8  CREATININE 1.01* 0.76  CALCIUM 9.0 8.4*   Liver Function Tests:  Recent Labs Lab 01/24/16 2035 01/25/16 0420  AST 22 18  ALT 17 15  ALKPHOS 46 41  BILITOT 1.2 0.9  PROT 6.6 5.6*  ALBUMIN 3.4* 3.0*   No results for input(s): LIPASE, AMYLASE in the last 168 hours. No results for input(s): AMMONIA in the last 168 hours. Coagulation Profile:  Recent Labs Lab 01/23/16 2359  INR 1.20   CBC:  Recent Labs Lab 01/24/16 2035 01/25/16 0420  WBC 10.6* 10.1  NEUTROABS 7.1  --   HGB 12.0 11.3*  HCT 36.8 34.9*  MCV 97.6 96.7  PLT 232 201   Cardiac Enzymes:  Recent Labs Lab 01/25/16 0023 01/25/16 0412 01/25/16 0949 01/25/16 1616  TROPONINI <0.03 <0.03 <0.03 <0.03   BNP: Invalid input(s): POCBNP CBG: No results for input(s): GLUCAP in the last 168 hours. HbA1C: No results for input(s): HGBA1C in the last 72 hours. Urine analysis: No results found for: COLORURINE, APPEARANCEUR, LABSPEC, PHURINE, GLUCOSEU, HGBUR, BILIRUBINUR, KETONESUR, PROTEINUR, UROBILINOGEN, NITRITE, LEUKOCYTESUR Sepsis Labs: @LABRCNTIP (procalcitonin:4,lacticidven:4) )No results found for this or any previous visit (from the past 240 hour(s)).   Scheduled Meds: . digoxin  0.125 mg Oral Daily  . pantoprazole  40 mg Oral Daily  . propranolol  10 mg Oral QHS  . propranolol  20 mg Oral Daily   Continuous Infusions: . heparin 1,250 Units/hr (01/26/16 1125)    Procedures/Studies: Dg Chest 2 View  Result Date: 01/24/2016 CLINICAL DATA:  Right rib pain, cough, generalized weakness and low-grade fever. History of recent intestinal infection. EXAM: CHEST  2 VIEW COMPARISON:  PA and lateral chest 06/18/2015. FINDINGS: Patchy airspace disease is seen in the  right lower lobe and there is a small right pleural effusion. The left lung is clear. Heart size is enlarged. No pneumothorax. Aortic atherosclerosis is identified. IMPRESSION: Patchy right lower lobe airspace disease and small right effusion worrisome for pneumonia. Cardiomegaly without edema. Atherosclerosis. Electronically Signed   By: Inge Rise M.D.   On: 01/24/2016 20:43   Ct Angio Chest Pe W And/or Wo Contrast  Result Date: 01/24/2016 CLINICAL DATA:  Acute onset of right lower chest pain, worse with deep inspiration. Initial encounter. EXAM: CT ANGIOGRAPHY CHEST WITH CONTRAST TECHNIQUE: Multidetector CT imaging of the chest was performed using the standard protocol during bolus administration of intravenous contrast. Multiplanar CT image reconstructions and MIPs were obtained to evaluate the vascular anatomy. CONTRAST:  80 mL of Isovue 370 IV contrast COMPARISON:  Chest radiograph performed earlier today at 8:31 p.m. FINDINGS: There is pulmonary embolus tracking into all lobes of both lungs, though sparing the main pulmonary arteries. The RV/LV ratio of 1.26 is compatible with right heart strain and concern for submassive pulmonary embolus. Biatrial enlargement is noted. A small right pleural effusion is  noted, with associated atelectasis. A few scattered apical opacities may reflect minimal interstitial edema. There is no evidence of pneumothorax. No masses are identified; no abnormal focal contrast enhancement is seen. The mediastinum is otherwise grossly unremarkable. No mediastinal lymphadenopathy is seen. No pericardial effusion is identified. Minimal scattered calcification is noted along the proximal great vessels and aortic arch. No axillary lymphadenopathy is seen. The visualized portions of the thyroid gland are unremarkable in appearance. The visualized portions of the liver and spleen are unremarkable. No acute osseous abnormalities are seen. Review of the MIP images confirms the above  findings. IMPRESSION: 1. Positive for acute PE with CT evidence of right heart strain (RV/LV Ratio = 1.26) consistent with at least submassive (intermediate risk) PE. Pulmonary embolus tracks into all lobes of both lungs, though sparing the main pulmonary arteries. The presence of right heart strain has been associated with an increased risk of morbidity and mortality. Please activate Code PE by paging [phone]. 2. Small right pleural effusion, with associated atelectasis. Few scattered apical opacities may reflect minimal interstitial edema. 3. Biatrial enlargement noted. Critical Value/emergent results were called by telephone at the time of interpretation on 01/24/2016 at 11:41 pm to Dr. Junius Creamer PA, who verbally acknowledged these results. Electronically Signed   By: Garald Balding M.D.   On: 01/24/2016 23:43    Ashunti Schofield, DO  [facility] Pager [phone]  If 7PM-7AM, please contact night-coverage [email or web address] Password [email or web address] 01/26/2016, 1:01 PM   LOS: 1 day

## 2016-01-27 DIAGNOSIS — I2601 Septic pulmonary embolism with acute cor pulmonale: Secondary | ICD-10-CM

## 2016-01-27 DIAGNOSIS — I2602 Saddle embolus of pulmonary artery with acute cor pulmonale: Secondary | ICD-10-CM

## 2016-01-27 DIAGNOSIS — I2699 Other pulmonary embolism without acute cor pulmonale: Secondary | ICD-10-CM

## 2016-01-27 LAB — BASIC METABOLIC PANEL
Anion gap: 8 (ref 5–15)
BUN: 9 mg/dL (ref 6–20)
CO2: 28 mmol/L (ref 22–32)
Calcium: 8.6 mg/dL — ABNORMAL LOW (ref 8.9–10.3)
Chloride: 104 mmol/L (ref 101–111)
Creatinine, Ser: 0.75 mg/dL (ref 0.44–1.00)
GFR calc Af Amer: >60 mL/min (ref >60)
GFR calc non Af Amer: >60 mL/min (ref >60)
Glucose, Bld: 83 mg/dL (ref 65–99)
Potassium: 3.8 mmol/L (ref 3.5–5.1)
Sodium: 140 mmol/L (ref 135–145)

## 2016-01-27 LAB — HEPARIN LEVEL (UNFRACTIONATED): Heparin Unfractionated: 0.39 IU/mL (ref 0.30–0.70)

## 2016-01-27 LAB — CBC
HCT: 35.7 % — ABNORMAL LOW (ref 36.0–46.0)
Hemoglobin: 11.5 g/dL — ABNORMAL LOW (ref 12.0–15.0)
MCH: 31.5 pg (ref 26.0–34.0)
MCHC: 32.2 g/dL (ref 30.0–36.0)
MCV: 97.8 fL (ref 78.0–100.0)
Platelets: 219 10*3/uL (ref 150–400)
RBC: 3.65 MIL/uL — ABNORMAL LOW (ref 3.87–5.11)
RDW: 12.9 % (ref 11.5–15.5)
WBC: 6.9 10*3/uL (ref 4.0–10.5)

## 2016-01-27 MED ORDER — APIXABAN 5 MG PO TABS
10.0000 mg | ORAL_TABLET | Freq: Two times a day (BID) | ORAL | Status: DC
  Administered 2016-01-27 – 2016-01-29 (×4): 10 mg via ORAL
  Filled 2016-01-27 (×4): qty 2

## 2016-01-27 MED ORDER — APIXABAN 5 MG PO TABS: 5.0000 mg | ORAL_TABLET | Freq: Two times a day (BID) | ORAL | Status: DC

## 2016-01-27 NOTE — Progress Notes (Signed)
Ambulate patient in hallway, denies pain, no shortness of breath. Afib on the monitor HR 80-115, 97%/RA. Will continue to monitor the patient.

## 2016-01-27 NOTE — Progress Notes (Signed)
Pt a/o, no c/o pain, hep gtt d/c'd Eliquis started, pt had low grade temp 99.1, PRN Tylenol given as ordered, MD notified, pt stable

## 2016-01-27 NOTE — Progress Notes (Signed)
[place] for Heparin --> to Eliquis Indication: pulmonary embolus / left DVT / Afib  Allergies  Allergen Reactions  . Epinephrine Palpitations   Patient Measurements: Height: 5\' 7"  (170.2 cm) Weight: 147 lb 9.6 oz (67 kg) (scale a) IBW/kg (Calculated) : 61.6  Vital Signs: Temp: 97.8 F (36.6 C) (08/09 1240) Temp Source: Oral (08/09 1240) BP: 104/57 (08/09 1240) Pulse Rate: 110 (08/09 1240)  Labs:  Recent Labs  01/24/16 2035  01/25/16 0412 01/25/16 0420 01/25/16 0949 01/25/16 1616 01/25/16 1714 01/26/16 0235 01/27/16 0451  HGB 12.0  --   --  11.3*  --   --   --   --  11.5*  HCT 36.8  --   --  34.9*  --   --   --   --  35.7*  PLT 232  --   --  201  --   --   --   --  219  HEPARINUNFRC  --   --   --   --   --   --  0.27* 0.32 0.39  CREATININE 1.01*  --   --  0.76  --   --   --   --  0.75  TROPONINI  --   < > <0.03  --  <0.03 <0.03  --   --   --   < > = values in this interval not displayed.  Estimated Creatinine Clearance: 57.3 mL/min (by C-G formula based on SCr of 0.8 mg/dL).   Assessment: [name] with chest/rib pain, CT Angio is + for submassive PE and DVT.   HL therapeutic this AM at 0.39  PM: To transition to Eliquis   Goal of Therapy:  Heparin level 0.3-0.7 units/ml Monitor platelets by anticoagulation protocol: Yes   Plan:  Stop heparin at 8 pm tonight Eliquis 10 mg po BID x 7 days starting tonight then transition to 5 mg po BID DC heparin labs  Thank you Anette Guarneri, PharmD [phone] 01/27/2016, 3:04 PM

## 2016-01-27 NOTE — Progress Notes (Signed)
[facility]  PROGRESS NOTE  Christy [name] [id number] DOB: [birth year] DOA: 01/24/2016 PCP: Wenda Low, MD    Brief HPI:  75 year old female with a history of paroxysmal tachycardia, anxiety, GERD, hyperlipidemia presents with 3-4 day history of increasing left greater than right lower extremity edema and right-sided chest discomfort. She stated that the right-sided chest pain was pleuritic and sharp in nature worsening by cough. Initial chest x-ray in the emergency department revealed right lower lobe infiltrate. There was some initial concern for pneumonia, and the patient was given a dose of levofloxacin. However CT angio ofthe chest was performed and revealed bilateral pulmonary emboli with evidence of right heart strain. The patient was given a dose of Lovenox subcutaneous in  ED and admitted for further evaluation. The patient was ultimately started on intravenous heparin.  The patient remains hemodynamically stable, but the patient is mildly hypoxic. She is stable on 2 L nasal cannula.Unfortunately, the patient developed atrial fibrillation with RVR. Cardiology was consulted and started the pt on digoxin.  Notably, the patient has been on hormone replacement therapy with CombiPatch, and she states that she was essentially "couch potato" for the past week prior to admission.     Assessment/Plan:    1. Acute respiratory failure with hypoxia- secondary to pulmonary emboli, CT angiogram chest showed bilateral pulmonary emboli affect with evidence of right heart strain. Stable at this time,O2 sats 95% on room air. 2. Pulmonary embolism/left lower extremity DVT- provoked by hormone replacement therapy. Estrogen supplementation discontinued. Patient on IV heparin, day #3. Agree with plan to switch over to Apixaban tonight. Echocardiogram--EF 55-60%, PEEP 58, moderate TR, MR, mild RV dilatation. 3. Atrial fibrillation with RVR- continue Inderal, digoxin. CHADs2VASC score is 3,   Anticoagulation with Apixaban tonight.   DVT prophylaxis: Heparin Code Status: Full code Family Communication: discussed with patient's husband and daughter at bedside Disposition Plan: home in  next 24-48 hrs   Consultants:  cardiology  Procedures:  None   Antibiotics:  None   Subjective   Patient seen and examined. Denies chest pain or shortness  of breath. Patient also diagnosed with A. Fib with  RVR,started on digoxin and Inderal per cardiology.  Objective    Objective: Vitals:   01/27/16 0756 01/27/16 0959 01/27/16 1240 01/27/16 1621  BP: 130/76 101/73 (!) 104/57 131/77  Pulse: 87 88 (!) 110 (!) 118  Resp: 18  18 18   Temp: 97.7 F (36.5 C)  97.8 F (36.6 C) 98.4 F (36.9 C)  TempSrc: Oral  Oral Oral  SpO2: 98%  100% 95%  Weight:      Height:        Intake/Output Summary (Last 24 hours) at 01/27/16 1705 Last data filed at 01/27/16 1241  Gross per 24 hour  Intake           755.83 ml  Output             1200 ml  Net          -444.17 ml   Filed Weights   01/25/16 0204 01/26/16 0719 01/27/16 0558  Weight: 72 kg (158 lb 11.2 oz) 66.9 kg (147 lb 6.4 oz) 67 kg (147 lb 9.6 oz)    Examination:  General exam: Appears calm and comfortable  Respiratory system: Clear to auscultation. Respiratory effort normal. Cardiovascular system: S1 & S2 heard, irregular rhythm,No JVD, murmurs, rubs, gallops or clicks. No pedal edema. Gastrointestinal system: Abdomen is nondistended, soft and nontender. No organomegaly or masses felt. Normal bowel  sounds heard. Central nervous system: Alert and oriented. No focal neurological deficits. Extremities: Symmetric 5 x 5 power. Skin: No rashes, lesions or ulcers Psychiatry: Judgement and insight appear normal. Mood & affect appropriate.    Data Reviewed: I have personally reviewed following labs and imaging studies Basic Metabolic Panel:  Recent Labs Lab 01/24/16 2035 01/25/16 0420 01/27/16 0451  NA 138 137 140  K 4.1  4.2 3.8  CL 104 107 104  CO2 23 23 28   GLUCOSE 103* 112* 83  BUN 9 8 9   CREATININE 1.01* 0.76 0.75  CALCIUM 9.0 8.4* 8.6*   Liver Function Tests:  Recent Labs Lab 01/24/16 2035 01/25/16 0420  AST 22 18  ALT 17 15  ALKPHOS 46 41  BILITOT 1.2 0.9  PROT 6.6 5.6*  ALBUMIN 3.4* 3.0*   CBC:  Recent Labs Lab 01/24/16 2035 01/25/16 0420 01/27/16 0451  WBC 10.6* 10.1 6.9  NEUTROABS 7.1  --   --   HGB 12.0 11.3* 11.5*  HCT 36.8 34.9* 35.7*  MCV 97.6 96.7 97.8  PLT 232 201 219   Cardiac Enzymes:  Recent Labs Lab 01/25/16 0023 01/25/16 0412 01/25/16 0949 01/25/16 1616  TROPONINI <0.03 <0.03 <0.03 <0.03     Studies: No results found.  Scheduled Meds: . apixaban  10 mg Oral Q12H  . [START ON 02/03/2016] apixaban  5 mg Oral BID  . digoxin  0.125 mg Oral Daily  . propranolol  40 mg Oral BID   Continuous Infusions: . heparin 1,250 Units/hr (01/27/16 0655)       Time spent: 25 min    [facility] Pager [phone]. If 7PM-7AM, please contact night-coverage at [email or web address], Office  [phone]  password [email or web address] 01/27/2016, 5:05 PM  LOS: 2 days

## 2016-01-27 NOTE — Progress Notes (Signed)
[place] for Heparin  Indication: pulmonary embolus / left DVT  Allergies  Allergen Reactions  . Epinephrine Palpitations   Patient Measurements: Height: 5\' 7"  (170.2 cm) Weight: 147 lb 9.6 oz (67 kg) (scale a) IBW/kg (Calculated) : 61.6  Vital Signs: Temp: 97.7 F (36.5 C) (08/09 0756) Temp Source: Oral (08/09 0756) BP: 101/73 (08/09 0959) Pulse Rate: 88 (08/09 0959)  Labs:  Recent Labs  01/24/16 2035  01/25/16 0412 01/25/16 0420 01/25/16 0949 01/25/16 1616 01/25/16 1714 01/26/16 0235 01/27/16 0451  HGB 12.0  --   --  11.3*  --   --   --   --  11.5*  HCT 36.8  --   --  34.9*  --   --   --   --  35.7*  PLT 232  --   --  201  --   --   --   --  219  HEPARINUNFRC  --   --   --   --   --   --  0.27* 0.32 0.39  CREATININE 1.01*  --   --  0.76  --   --   --   --  0.75  TROPONINI  --   < > <0.03  --  <0.03 <0.03  --   --   --   < > = values in this interval not displayed.  Estimated Creatinine Clearance: 57.3 mL/min (by C-G formula based on SCr of 0.8 mg/dL).   Assessment: [name] with chest/rib pain, CT Angio is + for submassive PE and DVT.   HL therapeutic this AM at 0.39  Goal of Therapy:  Heparin level 0.3-0.7 units/ml Monitor platelets by anticoagulation protocol: Yes   Plan:  - Increase heparin to 1250 units/hr  - Daily CBC/HL - Monitor for bleeding  Thank you Anette Guarneri, PharmD [phone] 01/27/2016, 10:51 AM

## 2016-01-27 NOTE — Progress Notes (Signed)
Patient Profile: 75 y/o female admitted for bilateral acute pulmonary embolism/ left LE DVT, also found to be in new onset atrial fibrillation w/ RVR. Her CHA2DS2 VASc score is 3.   Subjective: Patient feels better than yesterday. Still on supplemental O2. No significant CP. Still bothered by dry cough. No hemoptysis.  Objective: Vital signs in last 24 hours: Temp:  [97.5 F (36.4 C)-98.3 F (36.8 C)] 97.7 F (36.5 C) (08/09 0756) Pulse Rate:  [87-111] 88 (08/09 0959) Resp:  [16-18] 18 (08/09 0756) BP: (101-130)/(61-80) 101/73 (08/09 0959) SpO2:  [97 %-100 %] 98 % (08/09 0756) FiO2 (%):  [28 %] 28 % (08/08 1945) Weight:  [147 lb 9.6 oz (67 kg)] 147 lb 9.6 oz (67 kg) (08/09 0558) Last BM Date: 01/27/16  Intake/Output from previous day: 08/08 0701 - 08/09 0700 In: 1115.8 [P.O.:960; I.V.:155.8] Out: 2001 [Urine:2000; Stool:1] Intake/Output this shift: Total I/O In: 120 [P.O.:120] Out: -   Medications Current Facility-Administered Medications  Medication Dose Route Frequency Provider Last Rate Last Dose  . acetaminophen (TYLENOL) tablet 650 mg  650 mg Oral Q6H PRN Rise Patience, MD   650 mg at 01/26/16 2050   Or  . acetaminophen (TYLENOL) suppository 650 mg  650 mg Rectal Q6H PRN Rise Patience, MD      . digoxin (LANOXIN) tablet 0.125 mg  0.125 mg Oral Daily [name], MD   0.125 mg at 01/27/16 0959  . fentaNYL (SUBLIMAZE) injection 50 mcg  50 mcg Intravenous Q2H PRN Rise Patience, MD   50 mcg at 01/25/16 0055  . heparin ADULT infusion 100 units/mL (25000 units/219mL sodium chloride 0.45%)  1,250 Units/hr Intravenous Continuous Orson Eva, MD 12.5 mL/hr at 01/27/16 0655 1,250 Units/hr at 01/27/16 0655  . ondansetron (ZOFRAN) tablet 4 mg  4 mg Oral Q6H PRN Rise Patience, MD       Or  . ondansetron [facility]) injection 4 mg  4 mg Intravenous Q6H PRN Rise Patience, MD      . propranolol (INDERAL) tablet 40 mg  40 mg Oral BID [name], MD   40 mg at 01/27/16 0959    PE: General appearance: alert, cooperative and no distress Neck: no carotid bruit and no JVD Lungs: decreased BS at the bases bilaterally Heart: irregularly irregular rhythm and tachy rate Extremities: no LEE Pulses: 2+ and symmetric Skin: warm and dry Neurologic: Grossly normal  Lab Results:   Recent Labs  01/24/16 2035 01/25/16 0420 01/27/16 0451  WBC 10.6* 10.1 6.9  HGB 12.0 11.3* 11.5*  HCT 36.8 34.9* 35.7*  PLT 232 201 219   BMET  Recent Labs  01/24/16 2035 01/25/16 0420 01/27/16 0451  NA 138 137 140  K 4.1 4.2 3.8  CL 104 107 104  CO2 23 23 28   GLUCOSE 103* 112* 83  BUN 9 8 9   CREATININE 1.01* 0.76 0.75  CALCIUM 9.0 8.4* 8.6*   PT/INR No results for input(s): LABPROT, INR in the last 72 hours. Cardiac Panel (last 3 results)  Recent Labs  01/25/16 0412 01/25/16 0949 01/25/16 1616  TROPONINI <0.03 <0.03 <0.03    Studies/Results: 2D Echo 01/25/16  Study Conclusions  - Left ventricle: The cavity size was normal. There was mild focal   basal hypertrophy of the septum. Systolic function was normal.   The estimated ejection fraction was in the range of 55% to 60%.   Wall motion was normal; there were no regional wall motion  abnormalities. - Mitral valve: There was moderate regurgitation. - Left atrium: The atrium was severely dilated. - Right ventricle: The cavity size was mildly dilated. - Right atrium: The atrium was severely dilated. - Tricuspid valve: There was moderate regurgitation. - Pulmonary arteries: Systolic pressure was moderately increased.   PA peak pressure: 58 mm Hg (S).  Impressions:  - Normal LV systolic function; severe biatrial enlargement; mild   RVE; moderate MR and TR; moderately elevated pulmonary pressure.   Assessment/Plan  Principal Problem:   Pulmonary emboli (HCC) Active Problems:   PAT (paroxysmal atrial tachycardia) (HCC)   Pulmonary embolism with acute cor  pulmonale (HCC)   Acute pulmonary embolism (HCC)   Atrial fibrillation with RVR (HCC)   1. New Onset Atrial Fibrillation w/ RVR: in the setting of bilateral PE.  Resting ventricular rate improved with increase in propanolol, now in the low 100s (120s yesterday). BP is stable. Patient is currently asymptomatic at rest but notes HR increases with ambulation. 2D echo shows normal LVEF of 55-60%. Continue BB + digoxin.  Continue IV heparin. Given bilateral PE and high risk for CVA given CHA2DS2 VASc score of 3, she will require transition to oral anticoagulation. MD to determine if DDCV prior to discharge vs waiting 4 weeks for outpatient DCCV.   2. Bilateral PE/ left LE DVT: currently on IV heparin with plans to transition to oral a/c.  2D echo shows normal LV systolic function; severe biatrial enlargement; mild RVE; moderate MR and TR; moderately elevated pulmonary pressure.   LOS: 2 days   Brittainy M. Ladoris Gene 01/27/2016 11:28 AM  The patient was seen, examined and discussed with Brittainy M. Rosita Fire, PA-C and I agree with the above.   75 year old female admitted for left lower extremity DVT and bilateral pulmonary embolism and found to be in atrial fibrillation with RVR, previously documented atrial tachycardias, however the patient states that over the last year she has been having palpitations with irregular heartbeat are frequently. She has been started on IV heparin for pulmonary embolism several will require outpatient anticoagulation. CHADS-VASc score is 3. The patient states that she has been taking digoxin since 1972 and was doing great until a year ago when it was discontinued before because of bradycardia.   In the settings of acute pulmonary embolism I would prefer rate control, echocardiogram shows normal LV EF however severe left atrial enlargement and she has possibly been in atrial fibrillation for a while now. I started her on digoxin yesterday her heart rate continues to  be elevated, I increased propranolol to 40 mg po BID with improved HR control, now 90-110. I would continue this dose and have her walk with physical therapy.  I am reluctant to increase the dose with prior h/o bradycardia and assume improvement of HR with resolution of PE.  I would switch to Eliquis if ok with the primary service. I will ask pharmacy for dosing as for acute PE required dose is 10 mg po BID x 7 days, but she has received 3 days of iv heparin already.   Long term, if heart rate difficult to manage we will consider a cardioversion after 4 weeks of full anticoagulation.  Ena Dawley 01/27/2016

## 2016-01-28 DIAGNOSIS — Z5181 Encounter for therapeutic drug level monitoring: Secondary | ICD-10-CM

## 2016-01-28 DIAGNOSIS — Z7901 Long term (current) use of anticoagulants: Secondary | ICD-10-CM

## 2016-01-28 LAB — CBC
HCT: 34 % — ABNORMAL LOW (ref 36.0–46.0)
Hemoglobin: 10.9 g/dL — ABNORMAL LOW (ref 12.0–15.0)
MCH: 31.2 pg (ref 26.0–34.0)
MCHC: 32.1 g/dL (ref 30.0–36.0)
MCV: 97.4 fL (ref 78.0–100.0)
Platelets: 251 10*3/uL (ref 150–400)
RBC: 3.49 MIL/uL — ABNORMAL LOW (ref 3.87–5.11)
RDW: 12.9 % (ref 11.5–15.5)
WBC: 6.8 10*3/uL (ref 4.0–10.5)

## 2016-01-28 LAB — BRAIN NATRIURETIC PEPTIDE: B Natriuretic Peptide: 608.5 pg/mL — ABNORMAL HIGH (ref 0.0–100.0)

## 2016-01-28 LAB — GLUCOSE, CAPILLARY: Glucose-Capillary: 97 mg/dL (ref 65–99)

## 2016-01-28 MED ORDER — PANTOPRAZOLE SODIUM 40 MG PO TBEC
40.0000 mg | DELAYED_RELEASE_TABLET | Freq: Every day | ORAL | Status: DC
  Administered 2016-01-28 – 2016-01-29 (×2): 40 mg via ORAL
  Filled 2016-01-28 (×2): qty 1

## 2016-01-28 MED ORDER — CEPHALEXIN 500 MG PO CAPS
500.0000 mg | ORAL_CAPSULE | Freq: Two times a day (BID) | ORAL | Status: DC
  Administered 2016-01-28 – 2016-01-29 (×3): 500 mg via ORAL
  Filled 2016-01-28 (×3): qty 1

## 2016-01-28 MED ORDER — PROPRANOLOL HCL 60 MG PO TABS
60.0000 mg | ORAL_TABLET | Freq: Two times a day (BID) | ORAL | Status: DC
  Administered 2016-01-28 – 2016-01-29 (×3): 60 mg via ORAL
  Filled 2016-01-28 (×3): qty 1

## 2016-01-28 MED ORDER — PROPRANOLOL HCL 20 MG PO TABS
20.0000 mg | ORAL_TABLET | Freq: Once | ORAL | Status: AC
  Filled 2016-01-28: qty 1

## 2016-01-28 MED ORDER — FUROSEMIDE 20 MG PO TABS
20.0000 mg | ORAL_TABLET | Freq: Every day | ORAL | Status: DC
  Administered 2016-01-28 – 2016-01-29 (×2): 20 mg via ORAL
  Filled 2016-01-28 (×2): qty 1

## 2016-01-28 NOTE — Progress Notes (Addendum)
Patient Profile: 75 y/o female admitted for bilateral acute pulmonary embolism/ left LE DVT, also found to be in new onset atrial fibrillation w/ RVR. Her CHA2DS2 VASc score is 3.   Subjective: Continues to feel better. No significant CP. Still bothered by dry cough. No hemoptysis.  Objective: Vital signs in last 24 hours: Temp:  [97.8 F (36.6 C)-99.1 F (37.3 C)] 98.7 F (37.1 C) (08/10 0537) Pulse Rate:  [93-118] 93 (08/10 0537) Resp:  [18] 18 (08/10 0537) BP: (104-132)/(57-82) 108/57 (08/10 0537) SpO2:  [93 %-100 %] 93 % (08/10 0537) Weight:  [146 lb 12.8 oz (66.6 kg)] 146 lb 12.8 oz (66.6 kg) (08/10 0537) Last BM Date: 01/27/16  Intake/Output from previous day: 08/09 0701 - 08/10 0700 In: 600 [P.O.:600] Out: 1500 [Urine:1500] Intake/Output this shift: Total I/O In: 480 [P.O.:480] Out: -   Medications Current Facility-Administered Medications  Medication Dose Route Frequency Provider Last Rate Last Dose  . acetaminophen (TYLENOL) tablet 650 mg  650 mg Oral Q6H PRN Rise Patience, MD   650 mg at 01/27/16 2021   Or  . acetaminophen (TYLENOL) suppository 650 mg  650 mg Rectal Q6H PRN Rise Patience, MD      . apixaban (ELIQUIS) tablet 10 mg  10 mg Oral Q12H Oswald Hillock, MD   10 mg at 01/28/16 0853  . [START ON 02/03/2016] apixaban (ELIQUIS) tablet 5 mg  5 mg Oral BID Oswald Hillock, MD      . digoxin (LANOXIN) tablet 0.125 mg  0.125 mg Oral Daily [name], MD   0.125 mg at 01/28/16 0854  . fentaNYL (SUBLIMAZE) injection 50 mcg  50 mcg Intravenous Q2H PRN Rise Patience, MD   50 mcg at 01/25/16 0055  . ondansetron (ZOFRAN) tablet 4 mg  4 mg Oral Q6H PRN Rise Patience, MD       Or  . ondansetron [facility]) injection 4 mg  4 mg Intravenous Q6H PRN Rise Patience, MD      . pantoprazole (PROTONIX) EC tablet 40 mg  40 mg Oral Daily Oswald Hillock, MD   40 mg at 01/28/16 1129  . propranolol (INDERAL) tablet 40 mg  40 mg Oral BID [name], MD   40 mg at 01/28/16 0854    PE: General appearance: alert, cooperative and no distress Neck: no carotid bruit and no JVD Lungs: decreased BS at the bases bilaterally Heart: irregularly irregular rhythm and tachy rate Extremities: no LEE Pulses: 2+ and symmetric Skin: warm and dry Neurologic: Grossly normal  Lab Results:   Recent Labs  01/27/16 0451 01/28/16 0330  WBC 6.9 6.8  HGB 11.5* 10.9*  HCT 35.7* 34.0*  PLT 219 251   BMET  Recent Labs  01/27/16 0451  NA 140  K 3.8  CL 104  CO2 28  GLUCOSE 83  BUN 9  CREATININE 0.75  CALCIUM 8.6*   PT/INR No results for input(s): LABPROT, INR in the last 72 hours. Cardiac Panel (last 3 results)  Recent Labs  01/25/16 1616  TROPONINI <0.03    Studies/Results: 2D Echo 01/25/16  Study Conclusions  - Left ventricle: The cavity size was normal. There was mild focal   basal hypertrophy of the septum. Systolic function was normal.   The estimated ejection fraction was in the range of 55% to 60%.   Wall motion was normal; there were no regional wall motion   abnormalities. - Mitral valve: There was moderate  regurgitation. - Left atrium: The atrium was severely dilated. - Right ventricle: The cavity size was mildly dilated. - Right atrium: The atrium was severely dilated. - Tricuspid valve: There was moderate regurgitation. - Pulmonary arteries: Systolic pressure was moderately increased.   PA peak pressure: 58 mm Hg (S).  Impressions:  - Normal LV systolic function; severe biatrial enlargement; mild   RVE; moderate MR and TR; moderately elevated pulmonary pressure.   Assessment/Plan  Principal Problem:   Pulmonary emboli (HCC) Active Problems:   PAT (paroxysmal atrial tachycardia) (HCC)   Pulmonary embolism with acute cor pulmonale (HCC)   Acute pulmonary embolism (HCC)   Atrial fibrillation with RVR (HCC)   1. New Onset Atrial Fibrillation w/ RVR: in the setting of bilateral PE.  Resting  ventricular rate improved with increase in propanolol, now in the 90s-low 100s. BP is stable. Patient is currently asymptomatic. 2D echo shows normal LVEF of 55-60%. Continue BB + digoxin.  Will keep propanolol dose the same as patient has prior h/o bradycardia and suspect that her HR will continue to improve with treatment of PE. Given bilateral PE and high risk for CVA given CHA2DS2 VASc score of 3, she will require long term oral anticoagulation. She has been transitioned to Eliquis. We will recheck an OP EKG in 4 weeks, if still in atrial fibrillation will plan for OP DCCV.   2. Bilateral PE/ left LE DVT: transitioned from IV heparin to Eliquis.  2D echo shows normal LV systolic function; severe biatrial enlargement; mild RVE; moderate MR and TR; moderately elevated pulmonary pressure.   LOS: 3 days   Brittainy M. Ladoris Gene 01/28/2016 11:54 AM  The patient was seen, examined and discussed with Brittainy M. Rosita Fire, PA-C and I agree with the above.   75 year old female admitted for left lower extremity DVT and bilateral pulmonary embolism and found to be in atrial fibrillation with RVR, previously documented atrial tachycardias, however the patient states that over the last year she has been having palpitations with irregular heartbeat are frequently. She has been started on IV heparin for pulmonary embolism several will require outpatient anticoagulation. CHADS-VASc score is 3. The patient states that she has been taking digoxin since 1972 and was doing great until a year ago when it was discontinued before because of bradycardia.   In the settings of acute pulmonary embolism I would prefer rate control, echocardiogram shows normal LV EF however severe left atrial enlargement and she has possibly beenin atrial fibrillation for a while now. I started her on digoxin yesterday her heart rate continues to be elevated, I increased propranolol to 40 mg po BID with improved HR control, now 90-118,  I will further increase propranolol to 60 mg po BID.  She has h/o bradycardia, we will follow her in the clinic in 1 week and re-adjust dose if needed.  She was switched to Eliquis today.  Long term, if heart rate difficult to manage we will consider a cardioversion after 4 weeks of full anticoagulation. I will add lasix 20 mg po daily for mild fluid overload. Anticipated discharge tomorrow, follow up with me on 8/18 at 2:15 pm.  Ena Dawley 01/28/2016

## 2016-01-28 NOTE — Progress Notes (Signed)
[facility]  PROGRESS NOTE  Christy [name] [id number] DOB: 1941/02/13 DOA: 01/24/2016 PCP: Wenda Low, MD    Brief HPI:  75 year old female with a history of paroxysmal tachycardia, anxiety, GERD, hyperlipidemia presents with 3-4 day history of increasing left greater than right lower extremity edema and right-sided chest discomfort. She stated that the right-sided chest pain was pleuritic and sharp in nature worsening by cough. Initial chest x-ray in the emergency department revealed right lower lobe infiltrate. There was some initial concern for pneumonia, and the patient was given a dose of levofloxacin. However CT angio ofthe chest was performed and revealed bilateral pulmonary emboli with evidence of right heart strain. The patient was given a dose of Lovenox subcutaneous in  ED and admitted for further evaluation. The patient was ultimately started on intravenous heparin.  The patient remains hemodynamically stable, but the patient is mildly hypoxic. She is stable on 2 L nasal cannula.Unfortunately, the patient developed atrial fibrillation with RVR. Cardiology was consulted and started the pt on digoxin.  Notably, the patient has been on hormone replacement therapy with CombiPatch, and she states that she was essentially "couch potato" for the past week prior to admission.     Assessment/Plan:    1. Acute respiratory failure with hypoxia- secondary to pulmonary emboli, CT angiogram chest showed bilateral pulmonary emboli affect with evidence of right heart strain. Stable at this time,O2 sats 95% on room air. 2. Cough/? Diastolic CHF- patient had echo in 2016 which showed bi atrial enlargement and mild pulmonary hypertension, will check BNP. Will discuss with Cardiology. 3. Pulmonary embolism/left lower extremity DVT- provoked by hormone replacement therapy. Estrogen supplementation discontinued. Patient on IV heparin, day #3. Agree with plan to switch over to Apixaban tonight.  Echocardiogram--EF 55-60%, PEEP 58, moderate TR, MR, mild RV dilatation. 4. Atrial fibrillation with RVR- continue Inderal, digoxin. CHADs2VASC score is 3,  Anticoagulation with Apixaban tonight.   DVT prophylaxis: Heparin Code Status: Full code Family Communication: discussed with patient's husband and daughter at bedside Disposition Plan: home in  next 24-48 hrs   Consultants:  cardiology  Procedures:  None   Antibiotics:  None   Subjective   Patient seen and examined. Complains of mild cough in the morning with phlegm. She has been having these symptoms for long time.  Objective    Objective: Vitals:   01/27/16 1240 01/27/16 1621 01/27/16 2011 01/28/16 0537  BP: (!) 104/57 131/77 132/82 (!) 108/57  Pulse: (!) 110 (!) 118 (!) 104 93  Resp: 18 18 18 18   Temp: 97.8 F (36.6 C) 98.4 F (36.9 C) 99.1 F (37.3 C) 98.7 F (37.1 C)  TempSrc: Oral Oral Oral Oral  SpO2: 100% 95% 94% 93%  Weight:    66.6 kg (146 lb 12.8 oz)  Height:        Intake/Output Summary (Last 24 hours) at 01/28/16 1038 Last data filed at 01/28/16 0847  Gross per 24 hour  Intake              960 ml  Output             1500 ml  Net             -540 ml   Filed Weights   01/26/16 0719 01/27/16 0558 01/28/16 0537  Weight: 66.9 kg (147 lb 6.4 oz) 67 kg (147 lb 9.6 oz) 66.6 kg (146 lb 12.8 oz)    Examination:  General exam: Appears calm and comfortable  Respiratory system: Crackles auscultated  in right lung base. Respiratory effort normal. Cardiovascular system: S1 & S2 heard, irregular rhythm,No JVD, murmurs, rubs, gallops or clicks. No pedal edema. Gastrointestinal system: Abdomen is nondistended, soft and nontender. No organomegaly or masses felt. Normal bowel sounds heard. Central nervous system: Alert and oriented. No focal neurological deficits. Extremities: Symmetric 5 x 5 power. Skin: No rashes, lesions or ulcers Psychiatry: Judgement and insight appear normal. Mood & affect  appropriate.    Data Reviewed: I have personally reviewed following labs and imaging studies Basic Metabolic Panel:  Recent Labs Lab 01/24/16 2035 01/25/16 0420 01/27/16 0451  NA 138 137 140  K 4.1 4.2 3.8  CL 104 107 104  CO2 23 23 28   GLUCOSE 103* 112* 83  BUN 9 8 9   CREATININE 1.01* 0.76 0.75  CALCIUM 9.0 8.4* 8.6*   Liver Function Tests:  Recent Labs Lab 01/24/16 2035 01/25/16 0420  AST 22 18  ALT 17 15  ALKPHOS 46 41  BILITOT 1.2 0.9  PROT 6.6 5.6*  ALBUMIN 3.4* 3.0*   CBC:  Recent Labs Lab 01/24/16 2035 01/25/16 0420 01/27/16 0451 01/28/16 0330  WBC 10.6* 10.1 6.9 6.8  NEUTROABS 7.1  --   --   --   HGB 12.0 11.3* 11.5* 10.9*  HCT 36.8 34.9* 35.7* 34.0*  MCV 97.6 96.7 97.8 97.4  PLT 232 201 219 251   Cardiac Enzymes:  Recent Labs Lab 01/25/16 0023 01/25/16 0412 01/25/16 0949 01/25/16 1616  TROPONINI <0.03 <0.03 <0.03 <0.03     Studies: No results found.  Scheduled Meds: . apixaban  10 mg Oral Q12H  . [START ON 02/03/2016] apixaban  5 mg Oral BID  . digoxin  0.125 mg Oral Daily  . pantoprazole  40 mg Oral Daily  . propranolol  40 mg Oral BID   Continuous Infusions:       Time spent: 25 min    [facility] Pager [phone]. If 7PM-7AM, please contact night-coverage at [email or web address], Office  [phone]  password [email or web address] 01/28/2016, 10:38 AM  LOS: 3 days

## 2016-01-28 NOTE — Discharge Instructions (Signed)
Information on my medicine - ELIQUIS (apixaban)  This medication education was reviewed with me or my healthcare representative as part of my discharge preparation.  The pharmacist that spoke with me during my hospital stay was:  Judieth Keens, [facility]  Why was Eliquis prescribed for you? Eliquis was prescribed to treat blood clots that may have been found in the veins of your legs (deep vein thrombosis) or in your lungs (pulmonary embolism) and to reduce the risk of them occurring again.  What do You need to know about Eliquis ? The starting dose is 10 mg (two 5 mg tablets) taken TWICE daily for the FIRST SEVEN (7) DAYS, then on 02/03/2016  the dose is reduced to ONE 5 mg tablet taken TWICE daily.  Eliquis may be taken with or without food.   Try to take the dose about the same time in the morning and in the evening. If you have difficulty swallowing the tablet whole please discuss with your pharmacist how to take the medication safely.  Take Eliquis exactly as prescribed and DO NOT stop taking Eliquis without talking to the doctor who prescribed the medication.  Stopping may increase your risk of developing a new blood clot.  Refill your prescription before you run out.  After discharge, you should have regular check-up appointments with your healthcare provider that is prescribing your Eliquis.    What do you do if you miss a dose? If a dose of ELIQUIS is not taken at the scheduled time, take it as soon as possible on the same day and twice-daily administration should be resumed. The dose should not be doubled to make up for a missed dose.  Important Safety Information A possible side effect of Eliquis is bleeding. You should call your healthcare provider right away if you experience any of the following: ? Bleeding from an injury or your nose that does not stop. ? Unusual colored urine (red or dark brown) or unusual colored stools (red or black). ? Unusual bruising for unknown  reasons. ? A serious fall or if you hit your head (even if there is no bleeding).  Some medicines may interact with Eliquis and might increase your risk of bleeding or clotting while on Eliquis. To help avoid this, consult your healthcare provider or pharmacist prior to using any new prescription or non-prescription medications, including herbals, vitamins, non-steroidal anti-inflammatory drugs (NSAIDs) and supplements.  This website has more information on Eliquis (apixaban): http://[email or web address].com/eliquis/home

## 2016-01-28 NOTE — Evaluation (Signed)
Physical Therapy Evaluation and Discharge Patient Details Name: Christy [name] MRN: [id number] DOB: Feb 08, 1941 Today's Date: 01/28/2016   History of Present Illness  75 year old female with a history of paroxysmal tachycardia, anxiety, GERD, hyperlipidemia presents with 3-4 day history of increasing left greater than right lower extremity edema and right-sided chest discomfort. CT angio ofthe chest was performed and revealed bilateral pulmonary emboli with evidence of right heart strain. Doppler +LLE DVT. Patient developed atrial fibrillation with RVR    Clinical Impression  Patient evaluated by Physical Therapy with no further acute PT needs identified. All education has been completed and the patient has no further questions. PT is signing off. Thank you for this referral.     Follow Up Recommendations No PT follow up    Equipment Recommendations  None recommended by PT    Recommendations for Other Services       Precautions / Restrictions Precautions Precautions: None Restrictions Weight Bearing Restrictions: No      Mobility  Bed Mobility Overal bed mobility: Independent                Transfers Overall transfer level: Independent Equipment used: None                Ambulation/Gait Ambulation/Gait assistance: Independent Ambulation Distance (Feet): 300 Feet Assistive device: None Gait Pattern/deviations: WFL(Within Functional Limits)   Gait velocity interpretation: Below normal speed for age/gender (intentionally slow to attempt HR <100 (per pt, MDs goal)) General Gait Details: no imbalance with head turns, avoiding obstacles, pivot turn  Stairs            Wheelchair Mobility    Modified Rankin (Stroke Patients Only)       Balance Overall balance assessment: Independent                                           Pertinent Vitals/Pain HR 85-114 (primarily 90-100 while walking)  Pain Assessment: No/denies pain     Home Living Family/patient expects to be discharged to:: Private residence Living Arrangements: Spouse/significant other Available Help at Discharge: Family Type of Home: House Home Access: Stairs to enter Entrance Stairs-Rails: None Technical brewer of Steps: 2 Home Layout: Multi-level Home Equipment: None      Prior Function Level of Independence: Independent         Comments: works out at gym, yoga, golf; up/down flights of stairs in home     Hand Dominance        Extremity/Trunk Assessment   Upper Extremity Assessment: Overall WFL for tasks assessed           Lower Extremity Assessment: Overall WFL for tasks assessed      Cervical / Trunk Assessment: Normal  Communication   Communication: No difficulties  Cognition Arousal/Alertness: Awake/alert Behavior During Therapy: Anxious Overall Cognitive Status: Within Functional Limits for tasks assessed                      General Comments General comments (skin integrity, edema, etc.): Educated to discuss timing of return to exercise program with MD    Exercises        Assessment/Plan    PT Assessment Patent does not need any further PT services  PT Diagnosis     PT Problem List    PT Treatment Interventions     PT Goals (Current goals can be found  in the Care Plan section) Acute Rehab PT Goals Patient Stated Goal: get back to being active PT Goal Formulation: All assessment and education complete, DC therapy    Frequency     Barriers to discharge        Co-evaluation               End of Session   Activity Tolerance: Patient tolerated treatment well Patient left: in chair;with call bell/phone within reach Nurse Communication: Mobility status (OK to be up in hall independently)         Time: [id number] PT Time Calculation (min) (ACUTE ONLY): 19 min   Charges:   PT Evaluation $PT Eval Low Complexity: 1 Procedure     PT G Codes:         Naelani Lafrance February 03, 2016, 9:40 AM Pager [phone]

## 2016-01-29 LAB — CBC
HCT: 33.6 % — ABNORMAL LOW (ref 36.0–46.0)
Hemoglobin: 10.9 g/dL — ABNORMAL LOW (ref 12.0–15.0)
MCH: 31.3 pg (ref 26.0–34.0)
MCHC: 32.4 g/dL (ref 30.0–36.0)
MCV: 96.6 fL (ref 78.0–100.0)
Platelets: 275 10*3/uL (ref 150–400)
RBC: 3.48 MIL/uL — ABNORMAL LOW (ref 3.87–5.11)
RDW: 12.9 % (ref 11.5–15.5)
WBC: 6.7 10*3/uL (ref 4.0–10.5)

## 2016-01-29 MED ORDER — APIXABAN 5 MG PO TABS: 10.0000 mg | ORAL_TABLET | Freq: Two times a day (BID) | ORAL | 0 refills | Status: DC

## 2016-01-29 MED ORDER — PROPRANOLOL HCL 60 MG PO TABS: 60.0000 mg | ORAL_TABLET | Freq: Two times a day (BID) | ORAL | 2 refills | Status: DC

## 2016-01-29 MED ORDER — APIXABAN 5 MG PO TABS: 5.0000 mg | ORAL_TABLET | Freq: Two times a day (BID) | ORAL | 3 refills | Status: DC

## 2016-01-29 MED ORDER — FUROSEMIDE 20 MG PO TABS: 20.0000 mg | ORAL_TABLET | Freq: Every day | ORAL | 2 refills | Status: DC

## 2016-01-29 MED ORDER — DIGOXIN 125 MCG PO TABS: 0.1250 mg | ORAL_TABLET | Freq: Every day | ORAL | 2 refills | Status: DC

## 2016-01-29 MED ORDER — CEPHALEXIN 500 MG PO CAPS: 500.0000 mg | ORAL_CAPSULE | Freq: Two times a day (BID) | ORAL | 0 refills | Status: DC

## 2016-01-29 MED ORDER — IBUPROFEN 200 MG PO TABS: 400.0000 mg | ORAL_TABLET | Freq: Once | ORAL | Status: DC

## 2016-01-29 NOTE — Discharge Summary (Signed)
Physician Discharge Summary  Christy [name] [id number] DOB: 04-Mar-1941 DOA: 01/24/2016  PCP: Wenda Low, MD  Admit date: 01/24/2016 Discharge date: 01/29/2016  Time spent: 25* minutes  Recommendations for Outpatient Follow-up:  1. Follow up cardiology in one week 2. Follow up PCP in 2 weeks   Discharge Diagnoses:  Principal Problem:   Pulmonary emboli (HCC) Active Problems:   PAT (paroxysmal atrial tachycardia) (HCC)   Pulmonary embolism with acute cor pulmonale (HCC)   Acute pulmonary embolism (HCC)   Atrial fibrillation with RVR (HCC)   Alteration in anticoagulation   Discharge Condition: Stable  Diet recommendation: Low salt diet  Filed Weights   01/27/16 0558 01/28/16 0537 01/29/16 0500  Weight: 67 kg (147 lb 9.6 oz) 66.6 kg (146 lb 12.8 oz) 67.1 kg (147 lb 14.4 oz)    History of present illness:  75 year old female with a history of paroxysmal tachycardia, anxiety, GERD, hyperlipidemia presents with 3-4 day history of increasing left greater than right lower extremity edema and right-sided chest discomfort. She stated that the right-sided chest pain was pleuritic and sharp in nature worsening by cough. Initial chest x-ray in the emergency department revealed right lower lobe infiltrate. There was some initial concern for pneumonia, and the patient was given a dose of levofloxacin. However CT angio ofthe chest was performed and revealed bilateral pulmonary emboli with evidence of right heart strain. The patient was given a dose of Lovenox subcutaneous in  [place] admitted for further evaluation.The patient was ultimately started on intravenous heparin.The patient remains hemodynamically stable, but the patient is mildly hypoxic. She is stable on 2 L nasal cannula.Unfortunately, the patient developed atrial fibrillation with RVR. Cardiology was consulted and started the pt on digoxin. Notably, the patient has been on hormone replacement therapy with CombiPatch, and she  states that she was essentially "couch potato" for the past week prior to admission.  Hospital Course:  1. Acute respiratory failure with hypoxia- improving, secondary to pulmonary emboli, CT angiogram chest showed bilateral pulmonary emboli affect with evidence of right heart strain. Stable at this time,O2 sats 95% on room air. 2. Cough/? Diastolic CHF- improved after starting Lasix,patient had echo in 2016 which showed bi atrial enlargement and mild pulmonary hypertension,  BNP elevated to 608 . Will d/c home on Po lasix. 3. Pulmonary embolism/left lower extremity DVT- provoked by hormone replacement therapy. Estrogen supplementation discontinued. Patient was on IV heparin, day #3.  Echocardiogram--EF 55-60%, PEEP 58, moderate TR, MR, mild RV dilatation.Continue Apixaban 4. Atrial fibrillation with RVR- continue Inderal, digoxin. CHADs2VASC score is 3,  Anticoagulation with Apixaban  5. Left upper extremity cellulitis- mild swelling and erythema at the IV site, improved with keflex 500 mg po bid.   Procedures:  Echocardiogram  Consultations:  Cardiology   Discharge Exam: Vitals:   01/28/16 2027 01/29/16 0500  BP: (!) 115/53 131/88  Pulse: 96 94  Resp: 20 20  Temp: 98.7 F (37.1 C) 98.3 F (36.8 C)    General: Appears in no acute distress Cardiovascular: S1S2 RRR Respiratory: Clear bilaterally  Discharge Instructions   Discharge Instructions    Diet - low sodium heart healthy    Complete by:  As directed   Increase activity slowly    Complete by:  As directed     Current Discharge Medication List    START taking these medications   Details  !! apixaban (ELIQUIS) 5 MG TABS tablet Take 2 tablets (10 mg total) by mouth every 12 (twelve) hours. Qty: 10 tablet, Refills:  0    !! apixaban (ELIQUIS) 5 MG TABS tablet Take 1 tablet (5 mg total) by mouth 2 (two) times daily. Qty: 60 tablet, Refills: 3    cephALEXin (KEFLEX) 500 MG capsule Take 1 capsule (500 mg total) by  mouth 2 (two) times daily. Qty: 6 capsule, Refills: 0    digoxin (LANOXIN) 0.125 MG tablet Take 1 tablet (0.125 mg total) by mouth daily. Qty: 30 tablet, Refills: 2    furosemide (LASIX) 20 MG tablet Take 1 tablet (20 mg total) by mouth daily. Qty: 30 tablet, Refills: 2     !! - Potential duplicate medications found. Please discuss with provider.    CONTINUE these medications which have CHANGED   Details  propranolol (INDERAL) 60 MG tablet Take 1 tablet (60 mg total) by mouth 2 (two) times daily. Qty: 60 tablet, Refills: 2      CONTINUE these medications which have NOT CHANGED   Details  pantoprazole (PROTONIX) 40 MG tablet Take 40 mg by mouth daily.      STOP taking these medications     estradiol-norethindrone (COMBIPATCH) 0.05-0.25 MG/DAY        Allergies  Allergen Reactions  . Epinephrine Palpitations   Follow-up Information    Ena Dawley, MD On 02/05/2016.   Specialty:  Cardiology Why:  2:15 PM  Contact information: [place] [id number] [phone]            The results of significant diagnostics from this hospitalization (including imaging, microbiology, ancillary and laboratory) are listed below for reference.    Significant Diagnostic Studies: Dg Chest 2 View  Result Date: 01/24/2016 CLINICAL DATA:  Right rib pain, cough, generalized weakness and low-grade fever. History of recent intestinal infection. EXAM: CHEST  2 VIEW COMPARISON:  PA and lateral chest 06/18/2015. FINDINGS: Patchy airspace disease is seen in the right lower lobe and there is a small right pleural effusion. The left lung is clear. Heart size is enlarged. No pneumothorax. Aortic atherosclerosis is identified. IMPRESSION: Patchy right lower lobe airspace disease and small right effusion worrisome for pneumonia. Cardiomegaly without edema. Atherosclerosis. Electronically Signed   By: Inge Rise M.D.   On: 01/24/2016 20:43   Ct Angio Chest Pe W  And/or Wo Contrast  Result Date: 01/24/2016 CLINICAL DATA:  Acute onset of right lower chest pain, worse with deep inspiration. Initial encounter. EXAM: CT ANGIOGRAPHY CHEST WITH CONTRAST TECHNIQUE: Multidetector CT imaging of the chest was performed using the standard protocol during bolus administration of intravenous contrast. Multiplanar CT image reconstructions and MIPs were obtained to evaluate the vascular anatomy. CONTRAST:  80 mL of Isovue 370 IV contrast COMPARISON:  Chest radiograph performed earlier today at 8:31 p.m. FINDINGS: There is pulmonary embolus tracking into all lobes of both lungs, though sparing the main pulmonary arteries. The RV/LV ratio of 1.26 is compatible with right heart strain and concern for submassive pulmonary embolus. Biatrial enlargement is noted. A small right pleural effusion is noted, with associated atelectasis. A few scattered apical opacities may reflect minimal interstitial edema. There is no evidence of pneumothorax. No masses are identified; no abnormal focal contrast enhancement is seen. The mediastinum is otherwise grossly unremarkable. No mediastinal lymphadenopathy is seen. No pericardial effusion is identified. Minimal scattered calcification is noted along the proximal great vessels and aortic arch. No axillary lymphadenopathy is seen. The visualized portions of the thyroid gland are unremarkable in appearance. The visualized portions of the liver and spleen are unremarkable. No acute  osseous abnormalities are seen. Review of the MIP images confirms the above findings. IMPRESSION: 1. Positive for acute PE with CT evidence of right heart strain (RV/LV Ratio = 1.26) consistent with at least submassive (intermediate risk) PE. Pulmonary embolus tracks into all lobes of both lungs, though sparing the main pulmonary arteries. The presence of right heart strain has been associated with an increased risk of morbidity and mortality. Please activate Code PE by paging  [phone]. 2. Small right pleural effusion, with associated atelectasis. Few scattered apical opacities may reflect minimal interstitial edema. 3. Biatrial enlargement noted. Critical Value/emergent results were called by telephone at the time of interpretation on 01/24/2016 at 11:41 pm to Dr. Junius Creamer PA, who verbally acknowledged these results. Electronically Signed   By: Garald Balding M.D.   On: 01/24/2016 23:43    Microbiology: No results found for this or any previous visit (from the past 240 hour(s)).   Labs: Basic Metabolic Panel:  Recent Labs Lab 01/24/16 2035 01/25/16 0420 01/27/16 0451  NA 138 137 140  K 4.1 4.2 3.8  CL 104 107 104  CO2 23 23 28   GLUCOSE 103* 112* 83  BUN 9 8 9   CREATININE 1.01* 0.76 0.75  CALCIUM 9.0 8.4* 8.6*   Liver Function Tests:  Recent Labs Lab 01/24/16 2035 01/25/16 0420  AST 22 18  ALT 17 15  ALKPHOS 46 41  BILITOT 1.2 0.9  PROT 6.6 5.6*  ALBUMIN 3.4* 3.0*   No results for input(s): LIPASE, AMYLASE in the last 168 hours. No results for input(s): AMMONIA in the last 168 hours. CBC:  Recent Labs Lab 01/24/16 2035 01/25/16 0420 01/27/16 0451 01/28/16 0330 01/29/16 0313  WBC 10.6* 10.1 6.9 6.8 6.7  NEUTROABS 7.1  --   --   --   --   HGB 12.0 11.3* 11.5* 10.9* 10.9*  HCT 36.8 34.9* 35.7* 34.0* 33.6*  MCV 97.6 96.7 97.8 97.4 96.6  PLT 232 201 219 251 275   Cardiac Enzymes:  Recent Labs Lab 01/25/16 0023 01/25/16 0412 01/25/16 0949 01/25/16 1616  TROPONINI <0.03 <0.03 <0.03 <0.03   BNP: BNP (last 3 results)  Recent Labs  01/28/16 1040  BNP 608.5*    ProBNP (last 3 results) No results for input(s): PROBNP in the last 8760 hours.  CBG:  Recent Labs Lab 01/28/16 0606  GLUCAP 97       Signed:  Eleonore Chiquito S MD.  [facility] 01/29/2016, 10:45 AM

## 2016-01-29 NOTE — Care Management Important Message (Signed)
Important Message  Patient Details  Name: Christy [name] MRN: [id number] Date of Birth: Dec 17, 1940   Medicare Important Message Given:  Yes    Kriss Perleberg Montine Circle 01/29/2016, 9:57 AM

## 2016-01-29 NOTE — Progress Notes (Signed)
Patient Profile: 75 y/o female admitted for bilateral acute pulmonary embolism/ left LE DVT, also found to be in new onset atrial fibrillation w/ RVR. Her CHA2DS2 VASc score is 3.   Subjective: Continues to feel better. No  CP. Fairly asymptomatic with her afib.   Objective: Vital signs in last 24 hours: Temp:  [98.3 F (36.8 C)-98.7 F (37.1 C)] 98.3 F (36.8 C) (08/11 0500) Pulse Rate:  [94-114] 94 (08/11 0500) Resp:  [20] 20 (08/11 0500) BP: (115-131)/(53-88) 131/88 (08/11 0500) SpO2:  [93 %-95 %] 93 % (08/11 0500) Weight:  [147 lb 14.4 oz (67.1 kg)] 147 lb 14.4 oz (67.1 kg) (08/11 0500) Last BM Date: 01/28/16  Intake/Output from previous day: 08/10 0701 - 08/11 0700 In: 960 [P.O.:960] Out: 800 [Urine:800] Intake/Output this shift: Total I/O In: 240 [P.O.:240] Out: 320 [Urine:320]  Medications Current Facility-Administered Medications  Medication Dose Route Frequency Provider Last Rate Last Dose  . acetaminophen (TYLENOL) tablet 650 mg  650 mg Oral Q6H PRN Rise Patience, MD   650 mg at 01/29/16 [id number]   Or  . acetaminophen (TYLENOL) suppository 650 mg  650 mg Rectal Q6H PRN Rise Patience, MD      . apixaban [facility]) tablet 10 mg  10 mg Oral Q12H Oswald Hillock, MD   10 mg at 01/28/16 2120  . [START ON 02/03/2016] apixaban (ELIQUIS) tablet 5 mg  5 mg Oral BID Oswald Hillock, MD      . cephALEXin (KEFLEX) capsule 500 mg  500 mg Oral Q12H Oswald Hillock, MD   500 mg at 01/28/16 2120  . digoxin (LANOXIN) tablet 0.125 mg  0.125 mg Oral Daily [name], MD   0.125 mg at 01/28/16 0854  . fentaNYL (SUBLIMAZE) injection 50 mcg  50 mcg Intravenous Q2H PRN Rise Patience, MD   50 mcg at 01/25/16 0055  . furosemide (LASIX) tablet 20 mg  20 mg Oral Daily [name], MD   20 mg at 01/28/16 1441  . ibuprofen (ADVIL,MOTRIN) tablet 400 mg  400 mg Oral Once Gardiner Barefoot, NP      . ondansetron [facility]) tablet 4 mg  4 mg Oral Q6H PRN Rise Patience,  MD       Or  . ondansetron [facility]) injection 4 mg  4 mg Intravenous Q6H PRN Rise Patience, MD      . pantoprazole (PROTONIX) EC tablet 40 mg  40 mg Oral Daily Oswald Hillock, MD   40 mg at 01/28/16 1129  . propranolol (INDERAL) tablet 60 mg  60 mg Oral BID [name], MD   60 mg at 01/28/16 2120    PE: General appearance: alert, cooperative and no distress Neck: no carotid bruit and no JVD Lungs: decreased BS at the bases bilaterally Heart: irregularly irregular rhythm and tachy rate Extremities: no LEE Pulses: 2+ and symmetric Skin: warm and dry Neurologic: Grossly normal  Lab Results:   Recent Labs  01/27/16 0451 01/28/16 0330 01/29/16 0313  WBC 6.9 6.8 6.7  HGB 11.5* 10.9* 10.9*  HCT 35.7* 34.0* 33.6*  PLT 219 251 275   BMET  Recent Labs  01/27/16 0451  NA 140  K 3.8  CL 104  CO2 28  GLUCOSE 83  BUN 9  CREATININE 0.75  CALCIUM 8.6*   PT/INR No results for input(s): LABPROT, INR in the last 72 hours. Cardiac Panel (last 3 results) No results for input(s): CKTOTAL, CKMB, TROPONINI,  RELINDX in the last 72 hours.  Studies/Results: 2D Echo 01/25/16  Study Conclusions  - Left ventricle: The cavity size was normal. There was mild focal   basal hypertrophy of the septum. Systolic function was normal.   The estimated ejection fraction was in the range of 55% to 60%.   Wall motion was normal; there were no regional wall motion   abnormalities. - Mitral valve: There was moderate regurgitation. - Left atrium: The atrium was severely dilated. - Right ventricle: The cavity size was mildly dilated. - Right atrium: The atrium was severely dilated. - Tricuspid valve: There was moderate regurgitation. - Pulmonary arteries: Systolic pressure was moderately increased.   PA peak pressure: 58 mm Hg (S).  Impressions:  - Normal LV systolic function; severe biatrial enlargement; mild   RVE; moderate MR and TR; moderately elevated pulmonary  pressure.   Assessment/Plan  Principal Problem:   Pulmonary emboli (HCC) Active Problems:   PAT (paroxysmal atrial tachycardia) (HCC)   Pulmonary embolism with acute cor pulmonale (HCC)   Acute pulmonary embolism (HCC)   Atrial fibrillation with RVR (HCC)   Alteration in anticoagulation   1. New Onset Atrial Fibrillation w/ RVR: in the setting of bilateral PE.  Resting ventricular rate improved with increase in propanolol, now in the 80s-low 100s. BP is stable. Patient is currently asymptomatic. 2D echo shows normal LVEF of 55-60%. Continue BB + digoxin.  Will keep propanolol dose the same as patient has prior h/o bradycardia and suspect that her HR will continue to improve with treatment of PE. Given bilateral PE and high risk for CVA given CHA2DS2 VASc score of 3, she will require long term oral anticoagulation. She has been transitioned to Eliquis. We will recheck an OP EKG in 4 weeks, if still in atrial fibrillation will plan for OP DCCV.   2. Bilateral PE/ left LE DVT: transitioned from IV heparin to Eliquis.  2D echo shows normal LV systolic function; severe biatrial enlargement; mild RVE; moderate MR and TR; moderately elevated pulmonary pressure.  3. Mild Volume Overload: breathing improved with addition of low dose lasix, 20 mg daily. Lungs CTAB.   Dispo: patient is stable for d/c home from a cardiac standpoint. Continue current meds. F/u with Dr. Meda Coffee Friday 02/05/16 at 2:15 PM.    LOS: 4 days   Brittainy M. Ladoris Gene 01/29/2016 9:30 AM  The patient was seen, examined and discussed with Brittainy M. Rosita Fire, PA-C and I agree with the above.   The patient is ready for discharge, her HR is finally controlled, I would continue the same dose of propranolol 60 mg po BID, lasix 20 mg po daily and Eliquis as advised by pharmacy. She will need those medicines from the inpatient pharmacy for few days. I will see her in the clinic of Friday August 18 at 2:15 pm.  Ena Dawley 01/29/2016

## 2016-01-29 NOTE — Progress Notes (Signed)
Eliquis coupon card given to patient with instructions for activating card prior to usage. Mindi Slicker [facility] [phone]

## 2016-02-02 ENCOUNTER — Encounter: Payer: Self-pay | Admitting: *Deleted

## 2016-02-05 ENCOUNTER — Encounter (INDEPENDENT_AMBULATORY_CARE_PROVIDER_SITE_OTHER): Payer: Self-pay

## 2016-02-05 ENCOUNTER — Ambulatory Visit (INDEPENDENT_AMBULATORY_CARE_PROVIDER_SITE_OTHER): Payer: PPO | Admitting: Cardiology

## 2016-02-05 ENCOUNTER — Telehealth: Payer: Self-pay | Admitting: *Deleted

## 2016-02-05 VITALS — BP 128/72 | HR 72 | Ht 67.0 in | Wt 141.0 lb

## 2016-02-05 DIAGNOSIS — I272 Other secondary pulmonary hypertension: Secondary | ICD-10-CM

## 2016-02-05 DIAGNOSIS — I471 Supraventricular tachycardia: Secondary | ICD-10-CM

## 2016-02-05 DIAGNOSIS — I4891 Unspecified atrial fibrillation: Secondary | ICD-10-CM

## 2016-02-05 DIAGNOSIS — I2699 Other pulmonary embolism without acute cor pulmonale: Secondary | ICD-10-CM | POA: Diagnosis not present

## 2016-02-05 DIAGNOSIS — I2601 Septic pulmonary embolism with acute cor pulmonale: Secondary | ICD-10-CM

## 2016-02-05 DIAGNOSIS — I82402 Acute embolism and thrombosis of unspecified deep veins of left lower extremity: Secondary | ICD-10-CM

## 2016-02-05 LAB — COMPREHENSIVE METABOLIC PANEL
ALT: 16 U/L (ref 6–29)
AST: 19 U/L (ref 10–35)
Albumin: 4 g/dL (ref 3.6–5.1)
Alkaline Phosphatase: 51 U/L (ref 33–130)
BUN: 20 mg/dL (ref 7–25)
CO2: 27 mmol/L (ref 20–31)
Calcium: 9.6 mg/dL (ref 8.6–10.4)
Chloride: 102 mmol/L (ref 98–110)
Creat: 1.05 mg/dL — ABNORMAL HIGH (ref 0.60–0.93)
Glucose, Bld: 93 mg/dL (ref 65–99)
Potassium: 4.8 mmol/L (ref 3.5–5.3)
Sodium: 141 mmol/L (ref 135–146)
Total Bilirubin: 0.6 mg/dL (ref 0.2–1.2)
Total Protein: 6.8 g/dL (ref 6.1–8.1)

## 2016-02-05 MED ORDER — FUROSEMIDE 20 MG PO TABS: 20.0000 mg | ORAL_TABLET | Freq: Every day | ORAL | 2 refills | Status: DC | PRN

## 2016-02-05 NOTE — Addendum Note (Signed)
Addended by: Nuala Alpha on: 02/05/2016 04:50 PM   Modules accepted: Orders

## 2016-02-05 NOTE — Telephone Encounter (Signed)
Dr Meda Coffee, the pt called back into the office to request for you to write her a letter from a cardiac standpoint to help her out with freezing her gym membership for a couple of months.  Pt states this is needed so they want sub charge her.  Pt states that she already discussed this with you, she just forgot to request the letter at the [place].  Informed the pt that I will route this message to Dr Meda Coffee to review and advise on letter needed, and follow-up with her thereafter, when its available to be picked up.  Pt verbalized understanding and agrees with this plan. Pt states mailing this to her current address would be perfectly fine, once completed.

## 2016-02-05 NOTE — Progress Notes (Signed)
Cardiology Office Note    Date:  02/05/2016   ID:  [name], [name] 10-07-1940, MRN [id number]  PCP:  Wenda Low, MD  Cardiologist:  Ena Dawley, MD   Post hospital follow-up. 7 days.  History of Present Illness:  Christy [name] is a 75 y.o. female admitted for bilateral acute pulmonary embolism/ left LE DVT, also found to be in new onset atrial fibrillation w/ RVR. Her CHA2DS2 VASc score is 3. She was admitted on 01/24/2016 with acute shortness of breath and was found to have bilateral acute PE with acute cor pulmonale and fluid overload also DVT started on heparin drip. The patient had long-standing history of paroxysmal atrial tachycardia that was 4 years controlled by the junk seen and Inderal, digoxin was discontinued approximately year ago for profound bradycardia. She was found to be in new onset atrial fibrillation in the hospital and started on digoxin and Inderal, her home dose used to be 20 mg a day we had to increase it to 60 mg by mouth twice a day as her heart rate was persistently in 100-120. She was sent home on 10 mg of Eliquis twice a day that was decreased to 5 mg after 5 days, she reports no bleeding complications. She states that she has been taking Lasix 20 mg daily with significant diuresis, she has concern about low blood pressure with some dizziness on some days but no syncope. She states that her symptoms of palpitations have improved significantly and so did her shortness of breath. She denies any orthopnea or paroxysmal nocturnal dyspnea and no chest pain. Her cough has also improved significantly.    Past Medical History:  Diagnosis Date  . Bilateral lower extremity edema    Chronic, likely related to venous stasis   . DJD (degenerative joint disease), lumbosacral   . Dyslipidemia   . GERD (gastroesophageal reflux disease)   . History of hepatitis C virus infection    In remission  . 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 Osteopenia   .  Paroxysmal atrial tachycardia ([place])   . PE (pulmonary embolism)   . Spondylolisthesis    Status post epidural injections 3 by neurosurgery  - Dr. Joya Salm   . Varicose veins of both legs with edema   . Vitamin D deficiency     Past Surgical History:  Procedure Laterality Date  . COMBINED HYSTEROSCOPY DIAGNOSTIC / D&C  2005  . COMBINED HYSTEROSCOPY DIAGNOSTIC / D&C  1999  . NECK SURGERY  1995  . TOE SURGERY     replaced joint  . TUBAL LIGATION Bilateral 1976  . VAGINAL DELIVERY     x2    Current Medications: Outpatient Medications Prior to Visit  Medication Sig Dispense Refill  . apixaban (ELIQUIS) 5 MG TABS tablet Take 1 tablet (5 mg total) by mouth 2 (two) times daily. 60 tablet 3  . digoxin (LANOXIN) 0.125 MG tablet Take 1 tablet (0.125 mg total) by mouth daily. 30 tablet 2  . furosemide (LASIX) 20 MG tablet Take 1 tablet (20 mg total) by mouth daily. 30 tablet 2  . pantoprazole (PROTONIX) 40 MG tablet Take 40 mg by mouth daily.    . propranolol (INDERAL) 60 MG tablet Take 1 tablet (60 mg total) by mouth 2 (two) times daily. 60 tablet 2  . apixaban (ELIQUIS) 5 MG TABS tablet Take 2 tablets (10 mg total) by mouth every 12 (twelve) hours. 10 tablet 0  . cephALEXin (KEFLEX) 500  MG capsule Take 1 capsule (500 mg total) by mouth 2 (two) times daily. 6 capsule 0   No facility-administered medications prior to visit.      Allergies:   Epinephrine   Social History   Social History  . Marital status: Married    Spouse name: N/A  . Number of children: N/A  . Years of education: N/A   Social History Main Topics  . Smoking status: Never Smoker  . Smokeless tobacco: Never Used  . Alcohol use Yes     Comment: wine daily  . Drug use: No  . Sexual activity: Not on file     Comment: tubal ligation   Other Topics Concern  . Not on file   Social History Narrative   She is a married mother of 1. Lives with her husband. 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 She has never  smoked and drinks up to 10 glasses of wine or so week.   She usually exercises roughly 3 days a week doing yoga and plays golf. She is mostly limited by her "time constraints "     Family History:  The patient's family history includes CVA in her father and mother; Heart Problems in her mother; Heart attack (age of onset: 57) in her mother.   ROS:   Please see the history of present illness.    ROS All other systems reviewed and are negative.   PHYSICAL EXAM:   VS:  BP 128/72   Pulse 72   Ht 5\' 7"  (1.702 m)   Wt 141 lb (64 kg)   BMI 22.08 kg/m    GEN: Well nourished, well developed, in no acute distress  HEENT: normal  Neck: no JVD, carotid bruits, or masses Cardiac: iRRR; no murmurs, rubs, or gallops,mild crackles at the right bases  Respiratory:  clear to auscultation bilaterally, normal work of breathing GI: soft, nontender, nondistended, + BS MS: no deformity or atrophy  Skin: warm and dry, no rash Neuro:  Alert and Oriented x 3, Strength and sensation are intact Psych: euthymic mood, full affect  Wt Readings from Last 3 Encounters:  02/05/16 141 lb (64 kg)  01/29/16 147 lb 14.4 oz (67.1 kg)  09/25/15 149 lb 6.4 oz (67.8 kg)      Studies/Labs Reviewed:   EKG:  EKG is ordered today.  The ekg ordered today demonstrates Atrial fibrillation with controlled ventricular rate of 72 bpm, when compared to a prior EKG performed at Hospital her heart rate is now better controlled otherwise EKG is unchanged.  Recent Labs: 01/25/2016: ALT 15; TSH 2.609 01/27/2016: BUN 9; Creatinine, Ser 0.75; Potassium 3.8; Sodium 140 01/28/2016: B Natriuretic Peptide 608.5 01/29/2016: Hemoglobin 10.9; Platelets 275   Lipid Panel No results found for: CHOL, TRIG, HDL, CHOLHDL, VLDL, LDLCALC, LDLDIRECT  TTE: 03/2015 - Left ventricle: The cavity size was normal. Wall thickness was   normal. Systolic function was normal. The estimated ejection   fraction was in the range of 50% to 55%. Moderate  hypokinesis of   the midanteroseptal myocardium. - Left atrium: The atrium was severely dilated. - Right atrium: The atrium was moderately dilated. - Pulmonary arteries: Systolic pressure was moderately increased.   PA peak pressure: 44 mm Hg (S).  TTE: 01/25/2016 - Left ventricle: The cavity size was normal. There was mild focal   basal hypertrophy of the septum. Systolic function was normal.   The estimated ejection fraction was in the range of 55% to 60%.  Wall motion was normal; there were no regional wall motion   abnormalities. - Mitral valve: There was moderate regurgitation. - Left atrium: The atrium was severely dilated. - Right ventricle: The cavity size was mildly dilated. - Right atrium: The atrium was severely dilated. - Tricuspid valve: There was moderate regurgitation. - Pulmonary arteries: Systolic pressure was moderately increased.   PA peak pressure: 58 mm Hg (S).  Impressions: - Normal LV systolic function; severe biatrial enlargement; mild   RVE; moderate MR and TR; moderately elevated pulmonary pressure.    ASSESSMENT:    1. PAT (paroxysmal atrial tachycardia) ([place])   2. Pulmonary embolism, other ([place])   3. Acute septic pulmonary embolism with acute cor pulmonale (HCC)   4. Atrial fibrillation with RVR ([place])   5. DVT (deep venous thrombosis), left   6. Pulmonary hypertension ([place])      PLAN:  In order of problems listed above:  1. New Onset Atrial Fibrillation w/ RVR: in the setting of bilateral PE.  Resting ventricular rate improved with increase in propanolol, now 70, she states that at times it gets up to 115 at home. Her symptoms have significantly improved. I will see her again in 3 weeks, I anticipate that by the next visit she will have significantly improved symptoms of cor pulmonale and her tachycardia related to pulmonary embolism will improve, she is advised that her heart rate might significantly decreased and she is supposed to call us if  that happens and we will slowly taper down the dose of Inderal. The plan is to bring her back on September 22, and if she still in atrial fibrillation we will plan for outpatient cardioversion. We will continue anticoagulation with Eliquis. CHA2DS2 VASc score of 3.  2. Bilateral PE/ left LE DVT and acute cor pulmonale, severe biatrial enlargement; mild RVE; moderate MR and TR; moderately elevated pulmonary pressure. she was starting a low-dose of Lasix 20 mg daily and has lost 7 pounds in one week, we will check her CMP today to check for electrolytes and creatinine, because she has symptoms related to hypotension her blood pressure measure in 90s at home we will discontinue Lasix as she now appears almost euvolemic. She is advised to take Lasix as needed at home if her weight increases by 3 pounds overnight or 5 pounds in 1 week.  3. Severe pulmonary hypertension - previously mild-to-moderate RVSP 44 now 58 mmHg, we will follow with echocardiogram in November to recheck her right-sided chamber size and function and to measure right-sided pressures.  Medication Adjustments/Labs and Tests Ordered: Current medicines are reviewed at length with the patient today.  Concerns regarding medicines are outlined above.  Medication changes, Labs and Tests ordered today are listed in the Patient Instructions below. Patient Instructions  Medication Instructions:   Your physician recommends that you continue on your current medications as directed. Please refer to the Current Medication list given to you today.   Labwork:  TODAY--CMET AND BNP     Follow-Up:  WITH DR Meda Coffee ON 9/22 AT 9:45 AM.  Laurie Panda BY DR Meda Coffee     If you need a refill on your cardiac medications before your next appointment, please call your pharmacy.      Signed, Ena Dawley, MD  02/05/2016 3:56 PM    [address], [place], [place]  [address] Phone: [phone]; Fax: [phone]

## 2016-02-05 NOTE — Patient Instructions (Addendum)
Medication Instructions:   CHANGE YOUR LASIX TO 20 MG BY MOUTH DAILY AS NEEDED FOR LOWER EXTREMITY EDEMA, FLUID, WEIGHT GAIN OF 3 LBS IN 24 HRS OR 5 LBS IN 1 WEEK.    Labwork:  TODAY--CMET AND BNP     Follow-Up:  WITH DR Meda Coffee ON 9/22 AT 9:45 AM.  Laurie Panda BY DR Meda Coffee     If you need a refill on your cardiac medications before your next appointment, please call your pharmacy.

## 2016-02-06 LAB — BRAIN NATRIURETIC PEPTIDE: Brain Natriuretic Peptide: 254.4 pg/mL — ABNORMAL HIGH (ref <100)

## 2016-02-10 DIAGNOSIS — I2699 Other pulmonary embolism without acute cor pulmonale: Secondary | ICD-10-CM | POA: Diagnosis not present

## 2016-02-10 DIAGNOSIS — I82402 Acute embolism and thrombosis of unspecified deep veins of left lower extremity: Secondary | ICD-10-CM | POA: Diagnosis not present

## 2016-02-10 DIAGNOSIS — I4891 Unspecified atrial fibrillation: Secondary | ICD-10-CM | POA: Diagnosis not present

## 2016-02-10 DIAGNOSIS — I5030 Unspecified diastolic (congestive) heart failure: Secondary | ICD-10-CM | POA: Diagnosis not present

## 2016-02-17 ENCOUNTER — Encounter: Payer: Self-pay | Admitting: *Deleted

## 2016-02-26 ENCOUNTER — Encounter: Payer: Self-pay | Admitting: Cardiology

## 2016-03-09 DIAGNOSIS — Z1231 Encounter for screening mammogram for malignant neoplasm of breast: Secondary | ICD-10-CM | POA: Diagnosis not present

## 2016-03-11 ENCOUNTER — Encounter: Payer: Self-pay | Admitting: Cardiology

## 2016-03-11 ENCOUNTER — Ambulatory Visit (INDEPENDENT_AMBULATORY_CARE_PROVIDER_SITE_OTHER): Payer: PPO | Admitting: Cardiology

## 2016-03-11 ENCOUNTER — Encounter: Payer: Self-pay | Admitting: *Deleted

## 2016-03-11 VITALS — BP 112/80 | HR 61 | Ht 67.75 in | Wt 144.2 lb

## 2016-03-11 DIAGNOSIS — I2699 Other pulmonary embolism without acute cor pulmonale: Secondary | ICD-10-CM | POA: Diagnosis not present

## 2016-03-11 DIAGNOSIS — Z01812 Encounter for preprocedural laboratory examination: Secondary | ICD-10-CM

## 2016-03-11 DIAGNOSIS — I471 Supraventricular tachycardia: Secondary | ICD-10-CM | POA: Diagnosis not present

## 2016-03-11 DIAGNOSIS — I4891 Unspecified atrial fibrillation: Secondary | ICD-10-CM | POA: Diagnosis not present

## 2016-03-11 DIAGNOSIS — I10 Essential (primary) hypertension: Secondary | ICD-10-CM

## 2016-03-11 NOTE — Patient Instructions (Signed)
Medication Instructions:   STOP TAKING DIGOXIN NOW   Labwork:  ON Thursday March 17, 2016 FOR PRE-CARDIOVERSION LABS TO CHECK--BMET, CBC W DIFF, AND PT/INR   Testing/Procedures:  Your physician has requested that you have an echocardiogram. Echocardiography is a painless test that uses sound waves to create images of your heart. It provides your doctor with information about the size and shape of your heart and how well your heart's chambers and valves are working. This procedure takes approximately one hour. There are no restrictions for this procedure.    Your physician has requested that you have a Cardioversion. Electrical Cardioversion uses a jolt of electricity to your heart either through paddles or wired patches attached to your chest. This is a controlled, usually prescheduled, procedure. This procedure is done at the hospital and you are not awake during the procedure. You usually go home the day of the procedure. Please see the instruction sheet given to you today for more information. YOUR CARDIOVERSION IS SCHEDULED FOR March 21, 2016 AT 9 AM FOR DR NELSON TO DO.  YOU MUST ARRIVE AT SHORT STAY AT [facility]        If you need a refill on your cardiac medications before your next appointment, please call your pharmacy.

## 2016-03-11 NOTE — Progress Notes (Addendum)
Cardiology Office Note    Date:  03/11/2016   ID:  Christy [name] 05/25/1941, MRN [id number]  PCP:  Wenda Low, MD  Cardiologist:  Ena Dawley, MD   Post hospital follow-up. 7 days.  History of Present Illness:  Christy [name] is a 75 y.o. female admitted for bilateral acute pulmonary embolism/ left LE DVT, also found to be in new onset atrial fibrillation w/ RVR. Her CHA2DS2 VASc score is 3. She was admitted on 01/24/2016 with acute shortness of breath and was found to have bilateral acute PE with acute cor pulmonale and fluid overload also DVT started on heparin drip. The patient had long-standing history of paroxysmal atrial tachycardia that was 4 years controlled by the junk seen and Inderal, digoxin was discontinued approximately year ago for profound bradycardia. She was found to be in new onset atrial fibrillation in the hospital and started on digoxin and Inderal, her home dose used to be 20 mg a day we had to increase it to 60 mg by mouth twice a day as her heart rate was persistently in 100-120. She was sent home on 10 mg of Eliquis twice a day that was decreased to 5 mg after 5 days, she reports no bleeding complications. She states that she has been taking Lasix 20 mg daily with significant diuresis, she has concern about low blood pressure with some dizziness on some days but no syncope. She states that her symptoms of palpitations have improved significantly and so did her shortness of breath. She denies any orthopnea or paroxysmal nocturnal dyspnea and no chest pain. Her cough has also improved significantly.  03/11/2016 - the patient is coming after 4 weeks, she states that she feels significantly better but she still has persistent fatigue she is able to do most of her activities and denies any dizziness palpitations or syncope. No orthopnea or proximal nocturnal dyspnea. She has mild lower extremity edema worse on the left and right. Denies any chest pain has only  shortness of breath on strenuous exercise.  Past Medical History:  Diagnosis Date  . Bilateral lower extremity edema    Chronic, likely related to venous stasis   . DJD (degenerative joint disease), lumbosacral   . Dyslipidemia   . Essential hypertension   . GERD (gastroesophageal reflux disease)   . History of hepatitis C virus infection    In remission  . 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 Osteopenia   . Paroxysmal atrial tachycardia ([place])   . PE (pulmonary embolism)   . Spondylolisthesis    Status post epidural injections 3 by neurosurgery  - Dr. Joya Salm   . Varicose veins of both legs with edema   . Vitamin D deficiency     Past Surgical History:  Procedure Laterality Date  . COMBINED HYSTEROSCOPY DIAGNOSTIC / D&C  2005  . COMBINED HYSTEROSCOPY DIAGNOSTIC / D&C  1999  . NECK SURGERY  1995  . TOE SURGERY     replaced joint  . TUBAL LIGATION Bilateral 1976  . VAGINAL DELIVERY     x2    Current Medications: Outpatient Medications Prior to Visit  Medication Sig Dispense Refill  . apixaban (ELIQUIS) 5 MG TABS tablet Take 1 tablet (5 mg total) by mouth 2 (two) times daily. 60 tablet 3  . furosemide (LASIX) 20 MG tablet Take 1 tablet (20 mg total) by mouth daily as needed for fluid or edema (weight gain of 3 lbs in 24 hrs or  5 lbs in 1 week). 30 tablet 2  . pantoprazole (PROTONIX) 40 MG tablet Take 40 mg by mouth daily.    . propranolol (INDERAL) 60 MG tablet Take 1 tablet (60 mg total) by mouth 2 (two) times daily. 60 tablet 2  . digoxin (LANOXIN) 0.125 MG tablet Take 1 tablet (0.125 mg total) by mouth daily. 30 tablet 2   No facility-administered medications prior to visit.      Allergies:   Epinephrine   Social History   Social History  . Marital status: Married    Spouse name: N/A  . Number of children: N/A  . Years of education: N/A   Social History Main Topics  . Smoking status: Never Smoker  . Smokeless tobacco: Never Used  . Alcohol use Yes      Comment: wine daily  . Drug use: No  . Sexual activity: Not Asked     Comment: tubal ligation   Other Topics Concern  . None   Social History Narrative   She is a married mother of 1. Lives with her husband. 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 She has never smoked and drinks up to 10 glasses of wine or so week.   She usually exercises roughly 3 days a week doing yoga and plays golf. She is mostly limited by her "time constraints "     Family History:  The patient's family history includes CVA in her father and mother; Heart Problems in her mother; Heart attack (age of onset: 22) in her mother.   ROS:   Please see the history of present illness.    ROS All other systems reviewed and are negative.   PHYSICAL EXAM:   VS:  BP 112/80   Pulse 61   Ht 5' 7.75" (1.721 m)   Wt 144 lb 3.2 oz (65.4 kg)   SpO2 98%   BMI 22.09 kg/m    GEN: Well nourished, well developed, in no acute distress  HEENT: normal  Neck: no JVD, carotid bruits, or masses Cardiac: iRRR; no murmurs, rubs, or gallops,Clear to auscultation. Minimal lower extremity edema more left than right. Respiratory:  clear to auscultation bilaterally, normal work of breathing GI: soft, nontender, nondistended, + BS MS: no deformity or atrophy  Skin: warm and dry, no rash Neuro:  Alert and Oriented x 3, Strength and sensation are intact Psych: euthymic mood, full affect  Wt Readings from Last 3 Encounters:  03/11/16 144 lb 3.2 oz (65.4 kg)  02/05/16 141 lb (64 kg)  01/29/16 147 lb 14.4 oz (67.1 kg)      Studies/Labs Reviewed:   EKG:  EKG is ordered today.  The ekg ordered today demonstrates Atrial fibrillation with controlled ventricular rate of 72 bpm, when compared to a prior EKG performed at Hospital her heart rate is now better controlled otherwise EKG is unchanged.  Recent Labs: 01/25/2016: TSH 2.609 01/29/2016: Hemoglobin 10.9; Platelets 275 02/05/2016: ALT 16; Brain Natriuretic Peptide 254.4; BUN 20;  Creat 1.05; Potassium 4.8; Sodium 141   Lipid Panel No results found for: CHOL, TRIG, HDL, CHOLHDL, VLDL, LDLCALC, LDLDIRECT  TTE: 03/2015 - Left ventricle: The cavity size was normal. Wall thickness was   normal. Systolic function was normal. The estimated ejection   fraction was in the range of 50% to 55%. Moderate hypokinesis of   the midanteroseptal myocardium. - Left atrium: The atrium was severely dilated. - Right atrium: The atrium was moderately dilated. - Pulmonary arteries: Systolic pressure was  moderately increased.   PA peak pressure: 44 mm Hg (S).  TTE: 01/25/2016 - Left ventricle: The cavity size was normal. There was mild focal   basal hypertrophy of the septum. Systolic function was normal.   The estimated ejection fraction was in the range of 55% to 60%.   Wall motion was normal; there were no regional wall motion   abnormalities. - Mitral valve: There was moderate regurgitation. - Left atrium: The atrium was severely dilated. - Right ventricle: The cavity size was mildly dilated. - Right atrium: The atrium was severely dilated. - Tricuspid valve: There was moderate regurgitation. - Pulmonary arteries: Systolic pressure was moderately increased.   PA peak pressure: 58 mm Hg (S).  Impressions: - Normal LV systolic function; severe biatrial enlargement; mild   RVE; moderate MR and TR; moderately elevated pulmonary pressure.    ASSESSMENT:    1. Pulmonary embolism, other ([place])   2. PAT (paroxysmal atrial tachycardia) ([place])   3. Atrial fibrillation with RVR ([place])   4. Pre-procedure lab exam   5. Essential hypertension      PLAN:  In order of problems listed above:  1. New Onset Atrial Fibrillation w/ RVR: in the setting of bilateral PE. Her heart rate is now in 50s and 60s, I will discontinue digoxin. We'll schedule her for cardioversion on 03/21/2016 with me. We'll continue propranolol and decreased dose at the time of cardioversion if she becomes  profoundly bradycardic. Her symptoms have significantly improved. We will continue anticoagulation with Eliquis. CHA2DS2 VASc score of 3.  2. Bilateral PE/ left LE DVT and acute cor pulmonale, severe biatrial enlargement; mild RVE; moderate MR and TR; moderately elevated pulmonary pressure. On low-dose of Lasix when necessary. Significantly improved symptoms. Minimal lower extremity edema more left than right, probably residual of her DVT, doubt any new DVT while on Eliquis  3. Severe pulmonary hypertension - previously mild-to-moderate RVSP 44 now 58 mmHg, we will follow with echocardiogram in November to recheck her right-sided chamber size and function and to measure right-sided pressures. I will repeat an echocardiogram.  Medication Adjustments/Labs and Tests Ordered: Current medicines are reviewed at length with the patient today.  Concerns regarding medicines are outlined above.  Medication changes, Labs and Tests ordered today are listed in the Patient Instructions below. Patient Instructions  Medication Instructions:   STOP TAKING DIGOXIN NOW   Labwork:  ON Thursday March 17, 2016 FOR PRE-CARDIOVERSION LABS TO CHECK--BMET, CBC W DIFF, AND PT/INR   Testing/Procedures:  Your physician has requested that you have an echocardiogram. Echocardiography is a painless test that uses sound waves to create images of your heart. It provides your doctor with information about the size and shape of your heart and how well your heart's chambers and valves are working. This procedure takes approximately one hour. There are no restrictions for this procedure.    Your physician has requested that you have a Cardioversion. Electrical Cardioversion uses a jolt of electricity to your heart either through paddles or wired patches attached to your chest. This is a controlled, usually prescheduled, procedure. This procedure is done at the hospital and you are not awake during the procedure. You  usually go home the day of the procedure. Please see the instruction sheet given to you today for more information. YOUR CARDIOVERSION IS SCHEDULED FOR March 21, 2016 AT 9 AM FOR DR Dariya Gainer TO DO.  YOU MUST ARRIVE AT SHORT STAY AT [place]:  ONE MONTH  WITH DR Meda Coffee        If you need a refill on your cardiac medications before your next appointment, please call your pharmacy.      Signed, Ena Dawley, MD  03/11/2016 10:39 PM    [facility] [place] [address], [place], [place]  [address] Phone: [phone]; Fax: [phone]

## 2016-03-17 ENCOUNTER — Other Ambulatory Visit: Payer: PPO | Admitting: *Deleted

## 2016-03-17 DIAGNOSIS — Z01812 Encounter for preprocedural laboratory examination: Secondary | ICD-10-CM | POA: Diagnosis not present

## 2016-03-17 DIAGNOSIS — I4891 Unspecified atrial fibrillation: Secondary | ICD-10-CM

## 2016-03-17 DIAGNOSIS — I2699 Other pulmonary embolism without acute cor pulmonale: Secondary | ICD-10-CM | POA: Diagnosis not present

## 2016-03-17 DIAGNOSIS — I471 Supraventricular tachycardia: Secondary | ICD-10-CM | POA: Diagnosis not present

## 2016-03-17 LAB — CBC WITH DIFFERENTIAL/PLATELET
Basophils Absolute: 0 cells/uL (ref 0–200)
Basophils Relative: 0 %
Eosinophils Absolute: 300 cells/uL (ref 15–500)
Eosinophils Relative: 5 %
HCT: 35.8 % (ref 35.0–45.0)
Hemoglobin: 11.8 g/dL (ref 11.7–15.5)
Lymphocytes Relative: 31 %
Lymphs Abs: 1860 cells/uL (ref 850–3900)
MCH: 31.3 pg (ref 27.0–33.0)
MCHC: 33 g/dL (ref 32.0–36.0)
MCV: 95 fL (ref 80.0–100.0)
MPV: 11 fL (ref 7.5–12.5)
Monocytes Absolute: 720 cells/uL (ref 200–950)
Monocytes Relative: 12 %
Neutro Abs: 3120 cells/uL (ref 1500–7800)
Neutrophils Relative %: 52 %
Platelets: 252 10*3/uL (ref 140–400)
RBC: 3.77 MIL/uL — ABNORMAL LOW (ref 3.80–5.10)
RDW: 13.5 % (ref 11.0–15.0)
WBC: 6 10*3/uL (ref 3.8–10.8)

## 2016-03-17 LAB — BASIC METABOLIC PANEL
BUN: 20 mg/dL (ref 7–25)
CO2: 28 mmol/L (ref 20–31)
Calcium: 9.4 mg/dL (ref 8.6–10.4)
Chloride: 103 mmol/L (ref 98–110)
Creat: 0.89 mg/dL (ref 0.60–0.93)
Glucose, Bld: 98 mg/dL (ref 65–99)
Potassium: 3.8 mmol/L (ref 3.5–5.3)
Sodium: 141 mmol/L (ref 135–146)

## 2016-03-17 LAB — PROTIME-INR
INR: 1.1
Prothrombin Time: 12.1 s — ABNORMAL HIGH (ref 9.0–11.5)

## 2016-03-21 ENCOUNTER — Encounter (HOSPITAL_COMMUNITY): Payer: Self-pay

## 2016-03-21 ENCOUNTER — Ambulatory Visit (HOSPITAL_COMMUNITY)
Admission: RE | Admit: 2016-03-21 | Discharge: 2016-03-21 | Disposition: A | Discharge status: Home / Self Care | Payer: PPO | Source: Ambulatory Visit | Attending: Cardiology | Admitting: Cardiology

## 2016-03-21 ENCOUNTER — Encounter (HOSPITAL_COMMUNITY)
Admission: RE | Disposition: A | Discharge status: Home / Self Care | Payer: Self-pay | Source: Ambulatory Visit | Attending: Cardiology

## 2016-03-21 ENCOUNTER — Ambulatory Visit (HOSPITAL_COMMUNITY): Payer: PPO | Admitting: Certified Registered Nurse Anesthetist

## 2016-03-21 DIAGNOSIS — Z79899 Other long term (current) drug therapy: Secondary | ICD-10-CM | POA: Diagnosis not present

## 2016-03-21 DIAGNOSIS — Z7901 Long term (current) use of anticoagulants: Secondary | ICD-10-CM | POA: Diagnosis not present

## 2016-03-21 DIAGNOSIS — J8410 Pulmonary fibrosis, unspecified: Secondary | ICD-10-CM | POA: Insufficient documentation

## 2016-03-21 DIAGNOSIS — I471 Supraventricular tachycardia: Secondary | ICD-10-CM | POA: Diagnosis not present

## 2016-03-21 DIAGNOSIS — Z9851 Tubal ligation status: Secondary | ICD-10-CM | POA: Diagnosis not present

## 2016-03-21 DIAGNOSIS — K219 Gastro-esophageal reflux disease without esophagitis: Secondary | ICD-10-CM | POA: Diagnosis not present

## 2016-03-21 DIAGNOSIS — E559 Vitamin D deficiency, unspecified: Secondary | ICD-10-CM | POA: Diagnosis not present

## 2016-03-21 DIAGNOSIS — Z888 Allergy status to other drugs, medicaments and biological substances status: Secondary | ICD-10-CM | POA: Diagnosis not present

## 2016-03-21 DIAGNOSIS — Z86711 Personal history of pulmonary embolism: Secondary | ICD-10-CM | POA: Diagnosis not present

## 2016-03-21 DIAGNOSIS — I480 Paroxysmal atrial fibrillation: Secondary | ICD-10-CM | POA: Insufficient documentation

## 2016-03-21 DIAGNOSIS — Z86718 Personal history of other venous thrombosis and embolism: Secondary | ICD-10-CM | POA: Diagnosis not present

## 2016-03-21 DIAGNOSIS — I272 Pulmonary hypertension, unspecified: Secondary | ICD-10-CM | POA: Diagnosis not present

## 2016-03-21 DIAGNOSIS — I739 Peripheral vascular disease, unspecified: Secondary | ICD-10-CM | POA: Diagnosis not present

## 2016-03-21 DIAGNOSIS — I4891 Unspecified atrial fibrillation: Secondary | ICD-10-CM | POA: Diagnosis not present

## 2016-03-21 DIAGNOSIS — Z9889 Other specified postprocedural states: Secondary | ICD-10-CM | POA: Diagnosis not present

## 2016-03-21 DIAGNOSIS — M1990 Unspecified osteoarthritis, unspecified site: Secondary | ICD-10-CM | POA: Diagnosis not present

## 2016-03-21 DIAGNOSIS — I10 Essential (primary) hypertension: Secondary | ICD-10-CM | POA: Insufficient documentation

## 2016-03-21 DIAGNOSIS — M479 Spondylosis, unspecified: Secondary | ICD-10-CM | POA: Diagnosis not present

## 2016-03-21 DIAGNOSIS — Z823 Family history of stroke: Secondary | ICD-10-CM | POA: Diagnosis not present

## 2016-03-21 DIAGNOSIS — M8580 Other specified disorders of bone density and structure, unspecified site: Secondary | ICD-10-CM | POA: Insufficient documentation

## 2016-03-21 DIAGNOSIS — E785 Hyperlipidemia, unspecified: Secondary | ICD-10-CM | POA: Diagnosis not present

## 2016-03-21 DIAGNOSIS — I2609 Other pulmonary embolism with acute cor pulmonale: Secondary | ICD-10-CM | POA: Diagnosis not present

## 2016-03-21 DIAGNOSIS — B1920 Unspecified viral hepatitis C without hepatic coma: Secondary | ICD-10-CM | POA: Diagnosis not present

## 2016-03-21 HISTORY — PX: CARDIOVERSION: SHX1299

## 2016-03-21 SURGERY — CARDIOVERSION
Anesthesia: General

## 2016-03-21 MED ORDER — SODIUM CHLORIDE 0.9 % IV SOLN
INTRAVENOUS | Status: DC | PRN
  Administered 2016-03-21: 08:00:00 via INTRAVENOUS

## 2016-03-21 MED ORDER — SODIUM CHLORIDE 0.9 % IV SOLN
INTRAVENOUS | Status: DC
  Administered 2016-03-21: 500 mL via INTRAVENOUS

## 2016-03-21 MED ORDER — LIDOCAINE HCL (CARDIAC) 20 MG/ML IV SOLN
INTRAVENOUS | Status: DC | PRN
  Administered 2016-03-21: 60 mg via INTRATRACHEAL

## 2016-03-21 MED ORDER — PROPOFOL 10 MG/ML IV BOLUS
INTRAVENOUS | Status: DC | PRN
  Administered 2016-03-21: 50 mg via INTRAVENOUS
  Administered 2016-03-21: 30 mg via INTRAVENOUS

## 2016-03-21 NOTE — Transfer of Care (Signed)
Immediate Anesthesia Transfer of Care Note  Patient: Christy [name]  Procedure(s) Performed: Procedure(s): CARDIOVERSION (N/A)  Patient Location: Endoscopy Unit  Anesthesia Type:General  Level of Consciousness: awake, alert , oriented, patient cooperative and responds to stimulation  Airway & Oxygen Therapy: Patient Spontanous Breathing  Post-op Assessment: Report given to RN, Post -op Vital signs reviewed and stable and Patient moving all extremities X 4  Post vital signs: Reviewed and stable  Last Vitals:  Vitals:   03/21/16 0733  BP: (!) 154/98  Pulse: 80  Resp: 14    Last Pain:  Vitals:   03/21/16 0733  TempSrc: Oral         Complications: No apparent anesthesia complications

## 2016-03-21 NOTE — Anesthesia Postprocedure Evaluation (Signed)
Anesthesia Post Note  Patient: Christy [name]  Procedure(s) Performed: Procedure(s) (LRB): CARDIOVERSION (N/A)  Patient location during evaluation: PACU Anesthesia Type: General Level of consciousness: awake and alert Pain management: pain level controlled Vital Signs Assessment: post-procedure vital signs reviewed and stable Respiratory status: spontaneous breathing, nonlabored ventilation, respiratory function stable and patient connected to nasal cannula oxygen Cardiovascular status: blood pressure returned to baseline and stable Postop Assessment: no signs of nausea or vomiting Anesthetic complications: no    Last Vitals:  Vitals:   03/21/16 0850 03/21/16 0900  BP: (!) 143/84 (!) 153/95  Pulse: 86 (!) 34  Resp: 13 12  Temp:      Last Pain:  Vitals:   03/21/16 0835  TempSrc: Leonard Schwartz

## 2016-03-21 NOTE — Discharge Instructions (Signed)

## 2016-03-21 NOTE — Interval H&P Note (Signed)
History and Physical Interval Note:  03/21/2016 8:11 AM  Christy [name]  has presented today for surgery, with the diagnosis of AFIB  The various methods of treatment have been discussed with the patient and family. After consideration of risks, benefits and other options for treatment, the patient has consented to  Procedure(s): CARDIOVERSION (N/A) as a surgical intervention .  The patient's history has been reviewed, patient examined, no change in status, stable for surgery.  I have reviewed the patient's chart and labs.  Questions were answered to the patient's satisfaction.     Ena Dawley

## 2016-03-21 NOTE — Anesthesia Preprocedure Evaluation (Addendum)
Anesthesia Evaluation  Patient identified by MRN, date of birth, ID band Patient awake    Reviewed: Allergy & Precautions, NPO status , Patient's Chart, lab work & pertinent test results  Airway Mallampati: II  TM Distance: >3 FB Neck ROM: Full    Dental   Pulmonary neg pulmonary ROS,    breath sounds clear to auscultation       Cardiovascular hypertension, + Peripheral Vascular Disease   Rhythm:Regular Rate:Normal     Neuro/Psych negative neurological ROS     GI/Hepatic Neg liver ROS, GERD  ,  Endo/Other  negative endocrine ROS  Renal/GU negative Renal ROS     Musculoskeletal  (+) Arthritis ,   Abdominal   Peds  Hematology negative hematology ROS (+)   Anesthesia Other Findings   Reproductive/Obstetrics                            Anesthesia Physical Anesthesia Plan  ASA: III  Anesthesia Plan: General   Post-op Pain Management:    Induction: Intravenous  Airway Management Planned: Mask and Natural Airway  Additional Equipment:   Intra-op Plan:   Post-operative Plan:   Informed Consent: I have reviewed the patients History and Physical, chart, labs and discussed the procedure including the risks, benefits and alternatives for the proposed anesthesia with the patient or authorized representative who has indicated his/her understanding and acceptance.   Dental advisory given  Plan Discussed with: CRNA  Anesthesia Plan Comments:         Anesthesia Quick Evaluation

## 2016-03-21 NOTE — H&P (View-Only) (Signed)
Cardiology Office Note    Date:  03/11/2016   ID:  Christy, [name] 10/25/1940, MRN [id number]  PCP:  Wenda Low, MD  Cardiologist:  Ena Dawley, MD   Post hospital follow-up. 7 days.  History of Present Illness:  Christy [name] is a 75 y.o. female admitted for bilateral acute pulmonary embolism/ left LE DVT, also found to be in new onset atrial fibrillation w/ RVR. Her CHA2DS2 VASc score is 3. She was admitted on 01/24/2016 with acute shortness of breath and was found to have bilateral acute PE with acute cor pulmonale and fluid overload also DVT started on heparin drip. The patient had long-standing history of paroxysmal atrial tachycardia that was 4 years controlled by the junk seen and Inderal, digoxin was discontinued approximately year ago for profound bradycardia. She was found to be in new onset atrial fibrillation in the hospital and started on digoxin and Inderal, her home dose used to be 20 mg a day we had to increase it to 60 mg by mouth twice a day as her heart rate was persistently in 100-120. She was sent home on 10 mg of Eliquis twice a day that was decreased to 5 mg after 5 days, she reports no bleeding complications. She states that she has been taking Lasix 20 mg daily with significant diuresis, she has concern about low blood pressure with some dizziness on some days but no syncope. She states that her symptoms of palpitations have improved significantly and so did her shortness of breath. She denies any orthopnea or paroxysmal nocturnal dyspnea and no chest pain. Her cough has also improved significantly.  03/11/2016 - the patient is coming after 4 weeks, she states that she feels significantly better but she still has persistent fatigue she is able to do most of her activities and denies any dizziness palpitations or syncope. No orthopnea or proximal nocturnal dyspnea. She has mild lower extremity edema worse on the left and right. Denies any chest pain has only  shortness of breath on strenuous exercise.  Past Medical History:  Diagnosis Date  . Bilateral lower extremity edema    Chronic, likely related to venous stasis   . DJD (degenerative joint disease), lumbosacral   . Dyslipidemia   . Essential hypertension   . GERD (gastroesophageal reflux disease)   . History of hepatitis C virus infection    In remission  . 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 Osteopenia   . Paroxysmal atrial tachycardia ([place])   . PE (pulmonary embolism)   . Spondylolisthesis    Status post epidural injections 3 by neurosurgery  - Dr. Joya Salm   . Varicose veins of both legs with edema   . Vitamin D deficiency     Past Surgical History:  Procedure Laterality Date  . COMBINED HYSTEROSCOPY DIAGNOSTIC / D&C  2005  . COMBINED HYSTEROSCOPY DIAGNOSTIC / D&C  1999  . NECK SURGERY  1995  . TOE SURGERY     replaced joint  . TUBAL LIGATION Bilateral 1976  . VAGINAL DELIVERY     x2    Current Medications: Outpatient Medications Prior to Visit  Medication Sig Dispense Refill  . apixaban (ELIQUIS) 5 MG TABS tablet Take 1 tablet (5 mg total) by mouth 2 (two) times daily. 60 tablet 3  . furosemide (LASIX) 20 MG tablet Take 1 tablet (20 mg total) by mouth daily as needed for fluid or edema (weight gain of 3 lbs in 24 hrs or  5 lbs in 1 week). 30 tablet 2  . pantoprazole (PROTONIX) 40 MG tablet Take 40 mg by mouth daily.    . propranolol (INDERAL) 60 MG tablet Take 1 tablet (60 mg total) by mouth 2 (two) times daily. 60 tablet 2  . digoxin (LANOXIN) 0.125 MG tablet Take 1 tablet (0.125 mg total) by mouth daily. 30 tablet 2   No facility-administered medications prior to visit.      Allergies:   Epinephrine   Social History   Social History  . Marital status: Married    Spouse name: N/A  . Number of children: N/A  . Years of education: N/A   Social History Main Topics  . Smoking status: Never Smoker  . Smokeless tobacco: Never Used  . Alcohol use Yes      Comment: wine daily  . Drug use: No  . Sexual activity: Not Asked     Comment: tubal ligation   Other Topics Concern  . None   Social History Narrative   She is a married mother of 1. Lives with her husband. 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 She has never smoked and drinks up to 10 glasses of wine or so week.   She usually exercises roughly 3 days a week doing yoga and plays golf. She is mostly limited by her "time constraints "     Family History:  The patient's family history includes CVA in her father and mother; Heart Problems in her mother; Heart attack (age of onset: 6) in her mother.   ROS:   Please see the history of present illness.    ROS All other systems reviewed and are negative.   PHYSICAL EXAM:   VS:  BP 112/80   Pulse 61   Ht 5' 7.75" (1.721 m)   Wt 144 lb 3.2 oz (65.4 kg)   SpO2 98%   BMI 22.09 kg/m    GEN: Well nourished, well developed, in no acute distress  HEENT: normal  Neck: no JVD, carotid bruits, or masses Cardiac: iRRR; no murmurs, rubs, or gallops,Clear to auscultation. Minimal lower extremity edema more left than right. Respiratory:  clear to auscultation bilaterally, normal work of breathing GI: soft, nontender, nondistended, + BS MS: no deformity or atrophy  Skin: warm and dry, no rash Neuro:  Alert and Oriented x 3, Strength and sensation are intact Psych: euthymic mood, full affect  Wt Readings from Last 3 Encounters:  03/11/16 144 lb 3.2 oz (65.4 kg)  02/05/16 141 lb (64 kg)  01/29/16 147 lb 14.4 oz (67.1 kg)      Studies/Labs Reviewed:   EKG:  EKG is ordered today.  The ekg ordered today demonstrates Atrial fibrillation with controlled ventricular rate of 72 bpm, when compared to a prior EKG performed at Hospital her heart rate is now better controlled otherwise EKG is unchanged.  Recent Labs: 01/25/2016: TSH 2.609 01/29/2016: Hemoglobin 10.9; Platelets 275 02/05/2016: ALT 16; Brain Natriuretic Peptide 254.4; BUN 20;  Creat 1.05; Potassium 4.8; Sodium 141   Lipid Panel No results found for: CHOL, TRIG, HDL, CHOLHDL, VLDL, LDLCALC, LDLDIRECT  TTE: 03/2015 - Left ventricle: The cavity size was normal. Wall thickness was   normal. Systolic function was normal. The estimated ejection   fraction was in the range of 50% to 55%. Moderate hypokinesis of   the midanteroseptal myocardium. - Left atrium: The atrium was severely dilated. - Right atrium: The atrium was moderately dilated. - Pulmonary arteries: Systolic pressure was  moderately increased.   PA peak pressure: 44 mm Hg (S).  TTE: 01/25/2016 - Left ventricle: The cavity size was normal. There was mild focal   basal hypertrophy of the septum. Systolic function was normal.   The estimated ejection fraction was in the range of 55% to 60%.   Wall motion was normal; there were no regional wall motion   abnormalities. - Mitral valve: There was moderate regurgitation. - Left atrium: The atrium was severely dilated. - Right ventricle: The cavity size was mildly dilated. - Right atrium: The atrium was severely dilated. - Tricuspid valve: There was moderate regurgitation. - Pulmonary arteries: Systolic pressure was moderately increased.   PA peak pressure: 58 mm Hg (S).  Impressions: - Normal LV systolic function; severe biatrial enlargement; mild   RVE; moderate MR and TR; moderately elevated pulmonary pressure.    ASSESSMENT:    1. Pulmonary embolism, other ([place])   2. PAT (paroxysmal atrial tachycardia) ([place])   3. Atrial fibrillation with RVR ([place])   4. Pre-procedure lab exam   5. Essential hypertension      PLAN:  In order of problems listed above:  1. New Onset Atrial Fibrillation w/ RVR: in the setting of bilateral PE. Her heart rate is now in 50s and 60s, I will discontinue digoxin. We'll schedule her for cardioversion on 03/21/2016 with me. We'll continue propranolol and decreased dose at the time of cardioversion if she becomes  profoundly bradycardic. Her symptoms have significantly improved. We will continue anticoagulation with Eliquis. CHA2DS2 VASc score of 3.  2. Bilateral PE/ left LE DVT and acute cor pulmonale, severe biatrial enlargement; mild RVE; moderate MR and TR; moderately elevated pulmonary pressure. On low-dose of Lasix when necessary. Significantly improved symptoms. Minimal lower extremity edema more left than right, probably residual of her DVT, doubt any new DVT while on Eliquis  3. Severe pulmonary hypertension - previously mild-to-moderate RVSP 44 now 58 mmHg, we will follow with echocardiogram in November to recheck her right-sided chamber size and function and to measure right-sided pressures. I will repeat an echocardiogram.  Medication Adjustments/Labs and Tests Ordered: Current medicines are reviewed at length with the patient today.  Concerns regarding medicines are outlined above.  Medication changes, Labs and Tests ordered today are listed in the Patient Instructions below. Patient Instructions  Medication Instructions:   STOP TAKING DIGOXIN NOW   Labwork:  ON Thursday March 17, 2016 FOR PRE-CARDIOVERSION LABS TO CHECK--BMET, CBC W DIFF, AND PT/INR   Testing/Procedures:  Your physician has requested that you have an echocardiogram. Echocardiography is a painless test that uses sound waves to create images of your heart. It provides your doctor with information about the size and shape of your heart and how well your heart's chambers and valves are working. This procedure takes approximately one hour. There are no restrictions for this procedure.    Your physician has requested that you have a Cardioversion. Electrical Cardioversion uses a jolt of electricity to your heart either through paddles or wired patches attached to your chest. This is a controlled, usually prescheduled, procedure. This procedure is done at the hospital and you are not awake during the procedure. You  usually go home the day of the procedure. Please see the instruction sheet given to you today for more information. YOUR CARDIOVERSION IS SCHEDULED FOR March 21, 2016 AT 9 AM FOR DR Vaanya Shambaugh TO DO.  YOU MUST ARRIVE AT SHORT STAY AT [place]:  ONE MONTH  WITH DR Meda Coffee        If you need a refill on your cardiac medications before your next appointment, please call your pharmacy.      Signed, Ena Dawley, MD  03/11/2016 10:39 PM    [address], [place], [place]  [address] Phone: [phone]; Fax: [phone]

## 2016-03-21 NOTE — CV Procedure (Signed)
    Cardioversion Note  Christy [name] [id number] 1940/12/31  Procedure: DC Cardioversion Indications: atrial fibrillation  Procedure Details Consent: Obtained Time Out: Verified patient identification, verified procedure, site/side was marked, verified correct patient position, special equipment/implants available, Radiology Safety Procedures followed,  medications/allergies/relevent history reviewed, required imaging and test results available.  Performed  The patient has been on adequate anticoagulation.  The patient received IV propofol 80 mg and Lidocaine 60 mg for sedation.  Synchronous cardioversion was performed at 120 joules.  The cardioversion was successful.   Complications: No apparent complications Patient did tolerate procedure well.   Ena Dawley, MD, [facility] 03/21/2016, 8:33 AM

## 2016-03-22 ENCOUNTER — Encounter (HOSPITAL_COMMUNITY): Payer: Self-pay | Admitting: Cardiology

## 2016-03-24 ENCOUNTER — Ambulatory Visit (HOSPITAL_COMMUNITY): Payer: PPO | Attending: Cardiology

## 2016-03-24 ENCOUNTER — Other Ambulatory Visit: Payer: Self-pay | Admitting: Cardiology

## 2016-03-24 ENCOUNTER — Other Ambulatory Visit: Payer: Self-pay

## 2016-03-24 DIAGNOSIS — I4891 Unspecified atrial fibrillation: Secondary | ICD-10-CM | POA: Insufficient documentation

## 2016-03-24 DIAGNOSIS — I340 Nonrheumatic mitral (valve) insufficiency: Secondary | ICD-10-CM | POA: Insufficient documentation

## 2016-03-24 DIAGNOSIS — Z01812 Encounter for preprocedural laboratory examination: Secondary | ICD-10-CM

## 2016-03-24 DIAGNOSIS — I2699 Other pulmonary embolism without acute cor pulmonale: Secondary | ICD-10-CM

## 2016-03-24 DIAGNOSIS — Z01810 Encounter for preprocedural cardiovascular examination: Secondary | ICD-10-CM | POA: Insufficient documentation

## 2016-03-24 DIAGNOSIS — I471 Supraventricular tachycardia: Secondary | ICD-10-CM | POA: Insufficient documentation

## 2016-03-28 ENCOUNTER — Telehealth: Payer: Self-pay | Admitting: *Deleted

## 2016-03-28 DIAGNOSIS — Z5181 Encounter for therapeutic drug level monitoring: Secondary | ICD-10-CM | POA: Insufficient documentation

## 2016-03-28 DIAGNOSIS — I4891 Unspecified atrial fibrillation: Secondary | ICD-10-CM

## 2016-03-28 DIAGNOSIS — Z79899 Other long term (current) drug therapy: Secondary | ICD-10-CM

## 2016-03-28 MED ORDER — FLECAINIDE ACETATE 100 MG PO TABS: 100.0000 mg | ORAL_TABLET | Freq: Two times a day (BID) | ORAL | 3 refills | Status: DC

## 2016-03-28 NOTE — Telephone Encounter (Signed)
Notified the pt that per Dr Meda Coffee, her echo showed that her LV EF remains normal, right ventricular function has improved and pulmonary hypertension has resolved meaning her ulnar embolus is resolving. For her atrial fibrillation she recommends she would start flecainide 100 mg by mouth twice a day that has to be followed by an exercise nuclear stress test within 1 week from the initiation. Please schedule follow-up appointment in 3 weeks.   Informed Dr Meda Coffee that the pt already has a follow-up appt scheduled with Ellen Henri PA-C for next Wednesday 10/18 at 0900.  Per Dr Meda Coffee, ideally she would like for the pt to have her Nuclear stress test scheduled for next Monday 10/16 and see Ellen Henri, as scheduled for next Wednesday 10/18, to further go over these results and do another EKG to confirm if the pt is still in afib, for a cardioversion may need to be set up at that time.   Confirmed the pharmacy of choice with the pt and informed her that she must start taking her Flecainide 100 mg po BID, starting only one dose tonight 10/9 and then her regular regimen of BID (one dose in the morning and one dose in the evening), starting on 10/10, so in one week on Monday 10/16 she will have her exercise nuclear stress test, for initiation of this med. Confirmed the pharmacy of choice with the pt.  Went over exercise nuclear stress test instructions with the pt over the phone.   Informed her that she will need to hold her propanolol and her lasix (if she's utilizing this med for its PRN only), the morning of her nuclear stress test.  Informed the pt that I will mail her instructions to her at her confirmed mailing address as well.  Pts nuclear stress test is scheduled for next Monday 04/04/16 at [place], she must arrive 15 minutes early to this appt.  Informed the pt that this will be here at our [facility] location.  Pt verbalized understanding and agrees with this plan. Will route this message to  [place] PA-C and Dr Meda Coffee as an Juluis Rainier about scheduled testing and follow-up for next week.

## 2016-03-28 NOTE — Telephone Encounter (Signed)
-----   Message from [name], MD sent at 03/25/2016  5:22 PM EDT ----- LV EF remains normal, right ventricular function has improved and pulmonary hypertension has resolved meaning her ulnar embolus is resolving. For her atrial fibrillation I would start flecainide 100 mg by mouth twice a day that has to be followed by an exercise nuclear stress test within 1 week from the initiation. Please schedule follow-up appointment in 3 weeks.

## 2016-03-31 ENCOUNTER — Telehealth (HOSPITAL_COMMUNITY): Payer: Self-pay | Admitting: *Deleted

## 2016-03-31 NOTE — Telephone Encounter (Signed)
Left message on voicemail per DPR in reference to upcoming appointment scheduled on 04/04/16 with detailed instructions given per Myocardial Perfusion Study Information Sheet for the test. LM to arrive 15 minutes early, and that it is imperative to arrive on time for appointment to keep from having the test rescheduled. If you need to cancel or reschedule your appointment, please call the office within 24 hours of your appointment. Failure to do so may result in a cancellation of your appointment, and a $50 no show fee. Phone number given for call back for any questions.  Ashford Clouse, Tonya Jacqueline    

## 2016-04-01 DIAGNOSIS — Z01419 Encounter for gynecological examination (general) (routine) without abnormal findings: Secondary | ICD-10-CM | POA: Diagnosis not present

## 2016-04-01 DIAGNOSIS — Z6824 Body mass index (BMI) 24.0-24.9, adult: Secondary | ICD-10-CM | POA: Diagnosis not present

## 2016-04-04 ENCOUNTER — Ambulatory Visit (HOSPITAL_COMMUNITY): Payer: PPO | Attending: Cardiology

## 2016-04-04 DIAGNOSIS — R0602 Shortness of breath: Secondary | ICD-10-CM | POA: Insufficient documentation

## 2016-04-04 DIAGNOSIS — Z79899 Other long term (current) drug therapy: Secondary | ICD-10-CM | POA: Diagnosis not present

## 2016-04-04 DIAGNOSIS — Z5181 Encounter for therapeutic drug level monitoring: Secondary | ICD-10-CM

## 2016-04-04 DIAGNOSIS — I4891 Unspecified atrial fibrillation: Secondary | ICD-10-CM

## 2016-04-04 LAB — MYOCARDIAL PERFUSION IMAGING
Estimated workload: 5.3 METS
Exercise duration (min): 3 min
Exercise duration (sec): 0 s
LV dias vol: 119 mL (ref 46–106)
LV sys vol: 51 mL
MPHR: 143 {beats}/min
Peak HR: 76 {beats}/min
Percent HR: 53 %
RATE: 0.29
RPE: 19
Rest HR: 60 {beats}/min
SDS: 2
SRS: 7
SSS: 9
TID: 0.97

## 2016-04-04 MED ORDER — REGADENOSON 0.4 MG/5ML IV SOLN
0.4000 mg | Freq: Once | INTRAVENOUS | Status: AC
  Administered 2016-04-04: 0.4 mg via INTRAVENOUS

## 2016-04-04 MED ORDER — TECHNETIUM TC 99M TETROFOSMIN IV KIT
32.2000 | PACK | Freq: Once | INTRAVENOUS | Status: AC | PRN
  Administered 2016-04-04: 32.2 via INTRAVENOUS
  Filled 2016-04-04: qty 33

## 2016-04-04 MED ORDER — TECHNETIUM TC 99M TETROFOSMIN IV KIT
10.7000 | PACK | Freq: Once | INTRAVENOUS | Status: AC | PRN
  Administered 2016-04-04: 10.7 via INTRAVENOUS
  Filled 2016-04-04: qty 11

## 2016-04-06 ENCOUNTER — Ambulatory Visit (INDEPENDENT_AMBULATORY_CARE_PROVIDER_SITE_OTHER): Payer: PPO | Admitting: Cardiology

## 2016-04-06 ENCOUNTER — Encounter: Payer: Self-pay | Admitting: Cardiology

## 2016-04-06 VITALS — BP 130/80 | HR 63 | Ht 67.75 in | Wt 148.8 lb

## 2016-04-06 DIAGNOSIS — I480 Paroxysmal atrial fibrillation: Secondary | ICD-10-CM | POA: Diagnosis not present

## 2016-04-06 NOTE — Progress Notes (Signed)
04/06/2016 Christy [name]   05/17/1941  [id number]  Primary Physician Wenda Low, MD Primary Cardiologist: Dr. Meda Coffee   Reason for Visit/CC: F/u for Atrial Fibrillation and PE/DVT  HPI:  Patient is a 75 year old female, followed by Dr. Meda Coffee, who presents to clinic today for follow-up for atrial fibrillation and PE/DVT. Both her atrial fibrillation and PE/ DVT were recently diagnosed during recent hospitalization in August 2017, after presenting with CP and dyspnea. She was placed on Eliquis for anticoagulation. Ventricular rate was managed with propranolol. She was also treated with PRN  Lasix for lower extremity edema/diastolic dysfunction.  She was seen by Dr. Meda Coffee for post hospital follow-up on 03/11/2016. At that time, she noted that she continued to feel symptoms of fatigue. She denied chest pain or shortness of breath with strenuous activities. EKG revealed that she was still in atrial fibrillation. Decision was made to set up for outpatient cardioversion. This was performed by Dr. Meda Coffee on 03/21/2016. She converted to normal sinus rhythm for only a brief period of time. Before discharge from the hospital she had reverted back into atrial fibrillation. She was continued on propanolol and Dr. Meda Coffee decided to start her on flecainide, 100 mg twice a day. On week after starting flecainide, she underwent a stress test which was negative for ischemia. EF was normal.   She presents back to clinic today for follow-up. She is accompanied by her husband. She reports that she has done fairly well since her last visit. She denies any symptoms of palpitations. No chest pain or dyspnea. She has been fully compliant with flecainide and propanolol. She is tolerating medications well. She has also been fully compliant with Eliquis. She denies any abnormal bleeding. No falls. Her EKG today shows sinus rhythm with PACs. Heart rate is well controlled at 63 bpm. First-degree AV block is noted. Qt/QTc is  stable at 458/468. Her blood pressure is well-controlled.  Current Meds  Medication Sig  . apixaban (ELIQUIS) 5 MG TABS tablet Take 1 tablet (5 mg total) by mouth 2 (two) times daily.  . flecainide (TAMBOCOR) 100 MG tablet Take 1 tablet (100 mg total) by mouth 2 (two) times daily.  . furosemide (LASIX) 20 MG tablet Take 1 tablet (20 mg total) by mouth daily as needed for fluid or edema (weight gain of 3 lbs in 24 hrs or 5 lbs in 1 week).  . pantoprazole (PROTONIX) 40 MG tablet Take 40 mg by mouth daily.  . propranolol (INDERAL) 60 MG tablet Take 1 tablet (60 mg total) by mouth 2 (two) times daily.   Allergies  Allergen Reactions  . Epinephrine Palpitations   Past Medical History:  Diagnosis Date  . Bilateral lower extremity edema    Chronic, likely related to venous stasis   . DJD (degenerative joint disease), lumbosacral   . Dyslipidemia   . Essential hypertension   . GERD (gastroesophageal reflux disease)   . History of hepatitis C virus infection    In remission  . 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 Osteopenia   . Paroxysmal atrial tachycardia ([place])   . PE (pulmonary embolism)   . Spondylolisthesis    Status post epidural injections 3 by neurosurgery  - Dr. Joya Salm   . Varicose veins of both legs with edema   . Vitamin D deficiency    Family History  Problem Relation Age of Onset  . Heart Problems Mother   . CVA Mother   . Heart attack Mother 73  .  CVA Father   . Colon cancer Neg Hx   . Clotting disorder Neg Hx    Past Surgical History:  Procedure Laterality Date  . CARDIOVERSION N/A 03/21/2016   Procedure: CARDIOVERSION;  Surgeon: [name], MD;  Location: [facility];  Service: Cardiovascular;  Laterality: N/A;  . COMBINED HYSTEROSCOPY DIAGNOSTIC / D&C  2005  . COMBINED HYSTEROSCOPY DIAGNOSTIC / D&C  1999  . NECK SURGERY  1995  . TOE SURGERY     replaced joint  . TUBAL LIGATION Bilateral 1976  . VAGINAL DELIVERY     x2   Social History    Social History  . Marital status: Married    Spouse name: N/A  . Number of children: N/A  . Years of education: N/A   Occupational History  . Not on file.   Social History Main Topics  . Smoking status: Never Smoker  . Smokeless tobacco: Never Used  . Alcohol use Yes     Comment: wine daily  . Drug use: No  . Sexual activity: Not on file     Comment: tubal ligation   Other Topics Concern  . Not on file   Social History Narrative   She is a married mother of 1. Lives with her husband. 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 She has never smoked and drinks up to 10 glasses of wine or so week.   She usually exercises roughly 3 days a week doing yoga and plays golf. She is mostly limited by her "time constraints "     Review of Systems: General: negative for chills, fever, night sweats or weight changes.  Cardiovascular: negative for chest pain, dyspnea on exertion, edema, orthopnea, palpitations, paroxysmal nocturnal dyspnea or shortness of breath Dermatological: negative for rash Respiratory: negative for cough or wheezing Urologic: negative for hematuria Abdominal: negative for nausea, vomiting, diarrhea, bright red blood per rectum, melena, or hematemesis Neurologic: negative for visual changes, syncope, or dizziness All other systems reviewed and are otherwise negative except as noted above.   Physical Exam:  Blood pressure 130/80, pulse 63, height 5' 7.75" (1.721 m), weight 148 lb 12.8 oz (67.5 kg).  General appearance: alert, cooperative and no distress Neck: no carotid bruit and no JVD Lungs: clear to auscultation bilaterally Heart: regular rate and rhythm and with PACs Extremities: trace bilateral ankle edema, with vericosities noted Pulses: 2+ and symmetric Skin: warm and dry Neurologic: Grossly normal  EKG SR with PACs, 1st degree AVB. QT/QTc stable at 458/468 ms.   ASSESSMENT AND PLAN:   1. PAF: Failed DCCV. successful chemical conversion with  Flecainide. Stress test negative of ischemia. Normal EF. She is on appropriate AVN blocking agent with Flecainide, propanolol. QT/QTc is stable at 458/468 ms. She denies symptoms of breakthrough atrial fibrillation. EKG shows a few PACs but NSR. HR is well controlled in the 70s. Continue AAD and BB. She is fully anticoagulated with Eliquis.   2. PE/DVT: anticoagulated with Eliquis.   PLAN  F/u with Dr. Meda Coffee in 8 weeks.   Brittainy Simmons PA-C 04/06/2016 9:15 AM

## 2016-04-06 NOTE — Patient Instructions (Addendum)
Your physician recommends that you continue on your current medications as directed. Please refer to the Current Medication list given to you today.   Your physician recommends that you schedule a follow-up appointment in:  2 - [place]

## 2016-04-23 DIAGNOSIS — M79674 Pain in right toe(s): Secondary | ICD-10-CM | POA: Diagnosis not present

## 2016-04-25 ENCOUNTER — Other Ambulatory Visit: Payer: Self-pay | Admitting: Cardiology

## 2016-04-25 ENCOUNTER — Telehealth: Payer: Self-pay | Admitting: Cardiology

## 2016-04-25 NOTE — Telephone Encounter (Signed)
Advised patient there is no cardiac contraindication to getting a flu shot. I advised that the administrator of the vaccine should ask her the screening questions regarding allergies and previous reactions prior to administering. She verbalized understanding and thanked me for the call.

## 2016-04-25 NOTE — Telephone Encounter (Signed)
Mrs.Rochin is calling to find out if she can take the flu shot . Please call

## 2016-05-04 DIAGNOSIS — L570 Actinic keratosis: Secondary | ICD-10-CM | POA: Diagnosis not present

## 2016-05-04 DIAGNOSIS — Z85828 Personal history of other malignant neoplasm of skin: Secondary | ICD-10-CM | POA: Diagnosis not present

## 2016-05-16 DIAGNOSIS — I4891 Unspecified atrial fibrillation: Secondary | ICD-10-CM | POA: Diagnosis not present

## 2016-05-16 DIAGNOSIS — N182 Chronic kidney disease, stage 2 (mild): Secondary | ICD-10-CM | POA: Diagnosis not present

## 2016-05-16 DIAGNOSIS — K219 Gastro-esophageal reflux disease without esophagitis: Secondary | ICD-10-CM | POA: Diagnosis not present

## 2016-05-16 DIAGNOSIS — R7309 Other abnormal glucose: Secondary | ICD-10-CM | POA: Diagnosis not present

## 2016-05-16 DIAGNOSIS — I2699 Other pulmonary embolism without acute cor pulmonale: Secondary | ICD-10-CM | POA: Diagnosis not present

## 2016-05-16 DIAGNOSIS — I82402 Acute embolism and thrombosis of unspecified deep veins of left lower extremity: Secondary | ICD-10-CM | POA: Diagnosis not present

## 2016-05-16 DIAGNOSIS — B1920 Unspecified viral hepatitis C without hepatic coma: Secondary | ICD-10-CM | POA: Diagnosis not present

## 2016-05-16 DIAGNOSIS — E782 Mixed hyperlipidemia: Secondary | ICD-10-CM | POA: Diagnosis not present

## 2016-05-16 DIAGNOSIS — M8580 Other specified disorders of bone density and structure, unspecified site: Secondary | ICD-10-CM | POA: Diagnosis not present

## 2016-05-16 DIAGNOSIS — Z1389 Encounter for screening for other disorder: Secondary | ICD-10-CM | POA: Diagnosis not present

## 2016-05-16 DIAGNOSIS — Z0000 Encounter for general adult medical examination without abnormal findings: Secondary | ICD-10-CM | POA: Diagnosis not present

## 2016-05-17 DIAGNOSIS — M8588 Other specified disorders of bone density and structure, other site: Secondary | ICD-10-CM | POA: Diagnosis not present

## 2016-05-22 ENCOUNTER — Other Ambulatory Visit: Payer: Self-pay | Admitting: Cardiology

## 2016-06-08 ENCOUNTER — Encounter: Payer: Self-pay | Admitting: Cardiology

## 2016-06-08 ENCOUNTER — Ambulatory Visit (INDEPENDENT_AMBULATORY_CARE_PROVIDER_SITE_OTHER): Payer: PPO | Admitting: Cardiology

## 2016-06-08 VITALS — BP 136/72 | HR 56 | Ht 67.75 in | Wt 153.0 lb

## 2016-06-08 DIAGNOSIS — I4891 Unspecified atrial fibrillation: Secondary | ICD-10-CM | POA: Diagnosis not present

## 2016-06-08 DIAGNOSIS — R001 Bradycardia, unspecified: Secondary | ICD-10-CM | POA: Diagnosis not present

## 2016-06-08 DIAGNOSIS — I480 Paroxysmal atrial fibrillation: Secondary | ICD-10-CM | POA: Diagnosis not present

## 2016-06-08 DIAGNOSIS — Z5181 Encounter for therapeutic drug level monitoring: Secondary | ICD-10-CM | POA: Diagnosis not present

## 2016-06-08 DIAGNOSIS — Z79899 Other long term (current) drug therapy: Secondary | ICD-10-CM | POA: Diagnosis not present

## 2016-06-08 MED ORDER — APIXABAN 5 MG PO TABS: ORAL_TABLET | ORAL | 2 refills | Status: DC

## 2016-06-08 MED ORDER — PROPRANOLOL HCL 40 MG PO TABS: 40.0000 mg | ORAL_TABLET | Freq: Two times a day (BID) | ORAL | 2 refills | Status: DC

## 2016-06-08 MED ORDER — FUROSEMIDE 20 MG PO TABS: 20.0000 mg | ORAL_TABLET | Freq: Every day | ORAL | 3 refills | Status: AC | PRN

## 2016-06-08 MED ORDER — FLECAINIDE ACETATE 100 MG PO TABS: 100.0000 mg | ORAL_TABLET | Freq: Two times a day (BID) | ORAL | 3 refills | Status: DC

## 2016-06-08 NOTE — Patient Instructions (Signed)
Medication Instructions:   DECREASE YOUR PROPRANOLOL TO 40 MG TWICE DAILY     Follow-Up:  Your physician wants you to follow-up in: 4 MONTHS WITH DR Johann Capers will receive a reminder letter in the mail two months in advance. If you don't receive a letter, please call our office to schedule the follow-up appointment.        If you need a refill on your cardiac medications before your next appointment, please call your pharmacy.

## 2016-06-08 NOTE — Progress Notes (Signed)
06/08/2016 Christy [name]   [birth year]  [id number]  Primary Physician Wenda Low, MD Primary Cardiologist: Dr. Meda Coffee   Reason for Visit/CC: F/u for Atrial Fibrillation and PE/DVT  HPI:  Patient is a 75 year old female, followed by Dr. Meda Coffee, who presents to clinic today for follow-up for atrial fibrillation and PE/DVT. Both her atrial fibrillation and PE/ DVT were recently diagnosed during recent hospitalization in August 2017, after presenting with CP and dyspnea. She was placed on Eliquis for anticoagulation. Ventricular rate was managed with propranolol. She was also treated with PRN  Lasix for lower extremity edema/diastolic dysfunction.  She was seen by Dr. Meda Coffee for post hospital follow-up on 03/11/2016. At that time, she noted that she continued to feel symptoms of fatigue. She denied chest pain or shortness of breath with strenuous activities. EKG revealed that she was still in atrial fibrillation. Decision was made to set up for outpatient cardioversion. This was performed by Dr. Meda Coffee on 03/21/2016. She converted to normal sinus rhythm for only a brief period of time. Before discharge from the hospital she had reverted back into atrial fibrillation. She was continued on propanolol and Dr. Meda Coffee decided to start her on flecainide, 100 mg twice a day. On week after starting flecainide, she underwent a stress test which was negative for ischemia. EF was normal.   She presents back to clinic today for follow-up. She is accompanied by her husband. She reports that she has done fairly well since her last visit. She denies any symptoms of palpitations. No chest pain or dyspnea. She has been fully compliant with flecainide and propanolol. She is tolerating medications well. She has also been fully compliant with Eliquis. She denies any abnormal bleeding. No falls. Her EKG today shows sinus rhythm with PACs. Heart rate is well controlled at 63 bpm. First-degree AV block is noted. Qt/QTc is  stable at 458/468. Her blood pressure is well-controlled.  06/08/2016 - this is a 2 months follow-up, patient comes accompanied by her husband, she has been doing really well from cardiac standpoint she denies any chest pain or shortness of breath, lower extremity edema has resolved, however she feels slightly down and she is worrying about starting any physical activity. She feels mild dizziness when standing or with activities but denies any presyncope or syncope. She is occasional few seconds lasting palpitations but nothing longer than that. She isn't compliant with her meds and has no side effects.  Current Meds  Medication Sig  . ELIQUIS 5 MG TABS tablet TAKE 1 TABLET BY MOUTH TWICE DAILY(START 02/03/2016)  . flecainide (TAMBOCOR) 100 MG tablet Take 1 tablet (100 mg total) by mouth 2 (two) times daily.  . furosemide (LASIX) 20 MG tablet Take 1 tablet (20 mg total) by mouth daily as needed for fluid or edema (weight gain of 3 lbs in 24 hrs or 5 lbs in 1 week).  . pantoprazole (PROTONIX) 40 MG tablet Take 40 mg by mouth daily.  . propranolol (INDERAL) 60 MG tablet TAKE 1 TABLET BY MOUTH TWICE DAILY   Allergies  Allergen Reactions  . Epinephrine Palpitations   Past Medical History:  Diagnosis Date  . Bilateral lower extremity edema    Chronic, likely related to venous stasis   . DJD (degenerative joint disease), lumbosacral   . Dyslipidemia   . Essential hypertension   . GERD (gastroesophageal reflux disease)   . History of hepatitis C virus infection    In remission  . Lung granuloma ([facility]) 2004  CT scan  . Osteopenia   . Paroxysmal atrial tachycardia ([place])   . PE (pulmonary embolism)   . Spondylolisthesis    Status post epidural injections 3 by neurosurgery  - Dr. Joya Salm   . Varicose veins of both legs with edema   . Vitamin D deficiency    Family History  Problem Relation Age of Onset  . Heart Problems Mother   . CVA Mother   . Heart attack Mother 107  . CVA Father     . Colon cancer Neg Hx   . Clotting disorder Neg Hx    Past Surgical History:  Procedure Laterality Date  . CARDIOVERSION N/A 03/21/2016   Procedure: CARDIOVERSION;  Surgeon: [name], MD;  Location: [facility];  Service: Cardiovascular;  Laterality: N/A;  . COMBINED HYSTEROSCOPY DIAGNOSTIC / D&C  2005  . COMBINED HYSTEROSCOPY DIAGNOSTIC / D&C  1999  . NECK SURGERY  1995  . TOE SURGERY     replaced joint  . TUBAL LIGATION Bilateral 1976  . VAGINAL DELIVERY     x2   Social History   Social History  . Marital status: Married    Spouse name: N/A  . Number of children: N/A  . Years of education: N/A   Occupational History  . Not on file.   Social History Main Topics  . Smoking status: Never Smoker  . Smokeless tobacco: Never Used  . Alcohol use Yes     Comment: wine daily  . Drug use: No  . Sexual activity: Not on file     Comment: tubal ligation   Other Topics Concern  . Not on file   Social History Narrative   She is a married mother of 1. Lives with her husband. 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 She has never smoked and drinks up to 10 glasses of wine or so week.   She usually exercises roughly 3 days a week doing yoga and plays golf. She is mostly limited by her "time constraints "     Review of Systems: General: negative for chills, fever, night sweats or weight changes.  Cardiovascular: negative for chest pain, dyspnea on exertion, edema, orthopnea, palpitations, paroxysmal nocturnal dyspnea or shortness of breath Dermatological: negative for rash Respiratory: negative for cough or wheezing Urologic: negative for hematuria Abdominal: negative for nausea, vomiting, diarrhea, bright red blood per rectum, melena, or hematemesis Neurologic: negative for visual changes, syncope, or dizziness All other systems reviewed and are otherwise negative except as noted above.   Physical Exam:  Blood pressure 136/72, pulse (!) 56, height 5' 7.75"  (1.721 m), weight 153 lb (69.4 kg).  General appearance: alert, cooperative and no distress Neck: no carotid bruit and no JVD Lungs: clear to auscultation bilaterally Heart: regular rate and rhythm and with PACs Extremities: trace bilateral ankle edema, with vericosities noted Pulses: 2+ and symmetric Skin: warm and dry Neurologic: Grossly normal  EKG SR with PACs, 1st degree AVB. QT/QTc stable at 458/468 ms.    ASSESSMENT AND PLAN:   1. PAF: Failed DCCV, but successful chemical conversion with Flecainide. She remains in sinus rhythm. EKG shows normal sinus rhythm with first-degree AV block. Stress test negative of ischemia. Normal EF. She is on appropriate AVN blocking agent with Flecainide, propanolol. QT/QTc is stable. Her heart rate is in 50s and at home and lower 40s, will decrease propranolol dose to 40 mg by mouth twice a day. She denies symptoms of breakthrough atrial fibrillation. She is  fully anticoagulated with Eliquis. No bleeding.  2. PE/DVT: anticoagulated with Eliquis.   3. RV dilatation and and failure with acute pulmonary embolism in August 2017 - this has completely resolved. Patient has no signs of right-sided heart failure.  PLAN  F/u with Dr. Meda Coffee in 4 months after the patient returns from [place].   Ena Dawley , MD 06/08/2016 9:56 AM

## 2016-06-08 NOTE — Progress Notes (Signed)
06/08/2016 Christy [name]   Sep 07, 1940  [id number]  Primary Physician Wenda Low, MD Primary Cardiologist: Dr. Meda Coffee   Reason for Visit/CC: F/u for Atrial Fibrillation and PE/DVT  HPI:  Patient is a 75 year old female, followed by Dr. Meda Coffee, who presents to clinic today for follow-up for atrial fibrillation and PE/DVT. Both her atrial fibrillation and PE/ DVT were recently diagnosed during recent hospitalization in August 2017, after presenting with CP and dyspnea. She was placed on Eliquis for anticoagulation. Ventricular rate was managed with propranolol. She was also treated with PRN  Lasix for lower extremity edema/diastolic dysfunction.  She was seen by Dr. Meda Coffee for post hospital follow-up on 03/11/2016. At that time, she noted that she continued to feel symptoms of fatigue. She denied chest pain or shortness of breath with strenuous activities. EKG revealed that she was still in atrial fibrillation. Decision was made to set up for outpatient cardioversion. This was performed by Dr. Meda Coffee on 03/21/2016. She converted to normal sinus rhythm for only a brief period of time. Before discharge from the hospital she had reverted back into atrial fibrillation. She was continued on propanolol and Dr. Meda Coffee decided to start her on flecainide, 100 mg twice a day. On week after starting flecainide, she underwent a stress test which was negative for ischemia. EF was normal.   She presents back to clinic today for follow-up. She is accompanied by her husband. She reports that she has done fairly well since her last visit. She denies any symptoms of palpitations. No chest pain or dyspnea. She has been fully compliant with flecainide and propanolol. She is tolerating medications well. She has also been fully compliant with Eliquis. She denies any abnormal bleeding. No falls. Her EKG today shows sinus rhythm with PACs. Heart rate is well controlled at 63 bpm. First-degree AV block is noted. Qt/QTc is  stable at 458/468. Her blood pressure is well-controlled.  Current Meds  Medication Sig  . ELIQUIS 5 MG TABS tablet TAKE 1 TABLET BY MOUTH TWICE DAILY(START 02/03/2016)  . flecainide (TAMBOCOR) 100 MG tablet Take 1 tablet (100 mg total) by mouth 2 (two) times daily.  . furosemide (LASIX) 20 MG tablet Take 1 tablet (20 mg total) by mouth daily as needed for fluid or edema (weight gain of 3 lbs in 24 hrs or 5 lbs in 1 week).  . pantoprazole (PROTONIX) 40 MG tablet Take 40 mg by mouth daily.  . propranolol (INDERAL) 60 MG tablet TAKE 1 TABLET BY MOUTH TWICE DAILY   Allergies  Allergen Reactions  . Epinephrine Palpitations   Past Medical History:  Diagnosis Date  . Bilateral lower extremity edema    Chronic, likely related to venous stasis   . DJD (degenerative joint disease), lumbosacral   . Dyslipidemia   . Essential hypertension   . GERD (gastroesophageal reflux disease)   . History of hepatitis C virus infection    In remission  . Lung granuloma ([facility]) 2004    CT scan  . Osteopenia   . Paroxysmal atrial tachycardia ([place])   . PE (pulmonary embolism)   . Spondylolisthesis    Status post epidural injections 3 by neurosurgery  - Dr. Joya Salm   . Varicose veins of both legs with edema   . Vitamin D deficiency    Family History  Problem Relation Age of Onset  . Heart Problems Mother   . CVA Mother   . Heart attack Mother 70  . CVA Father   . Colon cancer Neg  Hx   . Clotting disorder Neg Hx    Past Surgical History:  Procedure Laterality Date  . CARDIOVERSION N/A 03/21/2016   Procedure: CARDIOVERSION;  Surgeon: [name], MD;  Location: [facility];  Service: Cardiovascular;  Laterality: N/A;  . COMBINED HYSTEROSCOPY DIAGNOSTIC / D&C  2005  . COMBINED HYSTEROSCOPY DIAGNOSTIC / D&C  1999  . NECK SURGERY  1995  . TOE SURGERY     replaced joint  . TUBAL LIGATION Bilateral 1976  . VAGINAL DELIVERY     x2   Social History   Social History  . Marital status:  Married    Spouse name: N/A  . Number of children: N/A  . Years of education: N/A   Occupational History  . Not on file.   Social History Main Topics  . Smoking status: Never Smoker  . Smokeless tobacco: Never Used  . Alcohol use Yes     Comment: wine daily  . Drug use: No  . Sexual activity: Not on file     Comment: tubal ligation   Other Topics Concern  . Not on file   Social History Narrative   She is a married mother of 1. Lives with her husband. 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 She has never smoked and drinks up to 10 glasses of wine or so week.   She usually exercises roughly 3 days a week doing yoga and plays golf. She is mostly limited by her "time constraints "     Review of Systems: General: negative for chills, fever, night sweats or weight changes.  Cardiovascular: negative for chest pain, dyspnea on exertion, edema, orthopnea, palpitations, paroxysmal nocturnal dyspnea or shortness of breath Dermatological: negative for rash Respiratory: negative for cough or wheezing Urologic: negative for hematuria Abdominal: negative for nausea, vomiting, diarrhea, bright red blood per rectum, melena, or hematemesis Neurologic: negative for visual changes, syncope, or dizziness All other systems reviewed and are otherwise negative except as noted above.   Physical Exam:  Blood pressure 136/72, pulse (!) 56, height 5' 7.75" (1.721 m), weight 153 lb (69.4 kg).  General appearance: alert, cooperative and no distress Neck: no carotid bruit and no JVD Lungs: clear to auscultation bilaterally Heart: regular rate and rhythm and with PACs Extremities: trace bilateral ankle edema, with vericosities noted Pulses: 2+ and symmetric Skin: warm and dry Neurologic: Grossly normal  EKG SR with PACs, 1st degree AVB. QT/QTc stable at 458/468 ms.   ASSESSMENT AND PLAN:   1. PAF: Failed DCCV. successful chemical conversion with Flecainide. Stress test negative of ischemia.  Normal EF. She is on appropriate AVN blocking agent with Flecainide, propanolol. QT/QTc is stable at 458/468 ms. She denies symptoms of breakthrough atrial fibrillation. EKG shows a few PACs but NSR. HR is well controlled in the 70s. Continue AAD and BB. She is fully anticoagulated with Eliquis.   2. PE/DVT: anticoagulated with Eliquis.   PLAN  F/u with Dr. Meda Coffee in 8 weeks.   Ena Dawley PA-C 06/08/2016 9:55 AM

## 2016-06-14 ENCOUNTER — Ambulatory Visit: Payer: PPO | Admitting: Cardiology

## 2016-07-04 ENCOUNTER — Telehealth: Payer: Self-pay | Admitting: Cardiology

## 2016-07-04 NOTE — Telephone Encounter (Signed)
Pt calling to inform Dr Meda Coffee that she went to join a gym in [place] and they did a Physical Assessment on her and noted in the left leg where she had her previous blood clot, it was swollen.   Pt states that its been swollen since she had a clot there, and she hasn't had to take her PRN lasix for a couple weeks now, for this was what she thought was "under control."  Pt states that her left leg is not red in appearance and not warm-to-touch, but still a bit swollen.  Pt states that she has very mild intermittent pain in that left leg, behind the knee.  Pt states it very seldom occurs though.  Pt states she hasn't been very compliant with wearing her compression stockings, due to the warmer climate in [place].   Pt does not feel its emergent at all to go and seek medical care at this time.  Pt does state she is slightly worried, for her friends and the [employer] who did her Physical assessment,  advised her to have this assessed by a Provider.   Pt would like for Dr Meda Coffee to advise on what she should do.   Advised the pt that she should have a Provider assess this to be on the safe side.  Advised the pt that she could refer to an Urgent care and have a Provider look at this and order appropriate testing, and if needed they can refer her to a Cardiologist there in [place], who she can see during her extended stay.  Advised the pt to wear her compression stockings as instructed. Advised the pt to walk around every hour during extended car rides or traveling.  Informed the pt that I will still route this message to Dr Meda Coffee for further review and recommendation and follow-up with the pt thereafter.   Pt verbalized understanding and agrees with this plan.  Pt states she will go to an Urgent care sometime this week.

## 2016-07-04 NOTE — Telephone Encounter (Signed)
Its highly unlikely that she would develop a new DVT while on Eliquis.  Warm weather might be adding to vasodilation and swelling. I would recommend to start using lasix 20 mg po daily x 1 week and let us know how it goes in 1 week.

## 2016-07-04 NOTE — Telephone Encounter (Signed)
New Message  Pt call requesting to speak with RN. Pt states she is out of town and need some advice from RN on her blood clot. Please call back to discuss

## 2016-07-04 NOTE — Telephone Encounter (Signed)
Notified the pt that per Dr Meda Coffee, its highly unlikely that she would develop a new DVT while on Eliquis.  Informed the pt that Dr Meda Coffee stated that with the warm weather, it might be adding to vasodilation and swelling.  Informed the pt that per Dr Meda Coffee, she recommends that she start using lasix 20 mg po daily x 1 week, then call the office back thereafter, to let us know how it goes.  Pt verbalized understanding and agrees with this plan.  Pt gracious for all the assistance provided.

## 2016-07-19 ENCOUNTER — Telehealth: Payer: Self-pay | Admitting: Cardiology

## 2016-07-19 MED ORDER — PROPRANOLOL HCL 20 MG PO TABS: 20.0000 mg | ORAL_TABLET | Freq: Two times a day (BID) | ORAL | Status: DC

## 2016-07-19 NOTE — Telephone Encounter (Signed)
Please decrease to 20 mg po BID

## 2016-07-19 NOTE — Telephone Encounter (Signed)
Called patient back about her medication question. Patient stated that her BP and HR have been running low in the mornings. HR 38-40 SBP 101 - 111.  Patient's Propranolol was decreased to 40 mg by mouth BID. Patient stated she has no problems in the evenings, but in the mornings she feels so drained. Will forward to Dr. Meda Coffee for advisement.

## 2016-07-19 NOTE — Telephone Encounter (Signed)
Christy [name] is calling because she has a question in reference to her medication . Please call     Thanks

## 2016-07-19 NOTE — Telephone Encounter (Signed)
Notified the pt that per Dr Meda Coffee, she recommends that we decrease her propranolol to 20 mg po BID.  Pt states that she has enough 20 mg tabs on hand and will call our office for further refills to send to a pharmacy located in [place], where she currently resides until April.  Pt verbalized understanding and agrees with this plan.

## 2016-09-28 ENCOUNTER — Ambulatory Visit (INDEPENDENT_AMBULATORY_CARE_PROVIDER_SITE_OTHER): Payer: PPO | Admitting: Cardiology

## 2016-09-28 ENCOUNTER — Encounter: Payer: Self-pay | Admitting: Cardiology

## 2016-09-28 VITALS — BP 92/56 | HR 37 | Ht 67.75 in | Wt 156.0 lb

## 2016-09-28 DIAGNOSIS — R600 Localized edema: Secondary | ICD-10-CM | POA: Diagnosis not present

## 2016-09-28 DIAGNOSIS — R001 Bradycardia, unspecified: Secondary | ICD-10-CM

## 2016-09-28 DIAGNOSIS — I480 Paroxysmal atrial fibrillation: Secondary | ICD-10-CM

## 2016-09-28 DIAGNOSIS — I272 Pulmonary hypertension, unspecified: Secondary | ICD-10-CM

## 2016-09-28 DIAGNOSIS — I6523 Occlusion and stenosis of bilateral carotid arteries: Secondary | ICD-10-CM | POA: Diagnosis not present

## 2016-09-28 DIAGNOSIS — I6529 Occlusion and stenosis of unspecified carotid artery: Secondary | ICD-10-CM | POA: Diagnosis not present

## 2016-09-28 MED ORDER — PROPRANOLOL HCL 10 MG PO TABS: 10.0000 mg | ORAL_TABLET | Freq: Two times a day (BID) | ORAL | 2 refills | Status: DC

## 2016-09-28 NOTE — Patient Instructions (Signed)
Medication Instructions:   DECREASE YOUR PROPRANOLOL TO 10 MG TWICE DAILY    Testing/Procedures:  Your physician has requested that you have a carotid duplex. This test is an ultrasound of the carotid arteries in your neck. It looks at blood flow through these arteries that supply the brain with blood. Allow one hour for this exam. There are no restrictions or special instructions.     Follow-Up:  IN MAY WITH DR Meda Coffee       If you need a refill on your cardiac medications before your next appointment, please call your pharmacy.

## 2016-09-28 NOTE — Progress Notes (Signed)
09/28/2016 Christy [name]   [birth year]  [id number]  Primary Physician Wenda Low, MD Primary Cardiologist: Dr. Meda Coffee   Reason for Visit/CC: F/u for Atrial Fibrillation and PE/DVT  HPI:  Patient is a 76 year old female, followed by Dr. Meda Coffee, who presents to clinic today for follow-up for atrial fibrillation and PE/DVT. Both her atrial fibrillation and PE/ DVT were recently diagnosed during recent hospitalization in August 2017, after presenting with CP and dyspnea. She was placed on Eliquis for anticoagulation. Ventricular rate was managed with propranolol. She was also treated with PRN  Lasix for lower extremity edema/diastolic dysfunction.  She was seen by Dr. Meda Coffee for post hospital follow-up on 03/11/2016. At that time, she noted that she continued to feel symptoms of fatigue. She denied chest pain or shortness of breath with strenuous activities. EKG revealed that she was still in atrial fibrillation. Decision was made to set up for outpatient cardioversion. This was performed by Dr. Meda Coffee on 03/21/2016. She converted to normal sinus rhythm for only a brief period of time. Before discharge from the hospital she had reverted back into atrial fibrillation. She was continued on propanolol and Dr. Meda Coffee decided to start her on flecainide, 100 mg twice a day. On week after starting flecainide, she underwent a stress test which was negative for ischemia. EF was normal.   She presents back to clinic today for follow-up. She is accompanied by her husband. She reports that she has done fairly well since her last visit. She denies any symptoms of palpitations. No chest pain or dyspnea. She has been fully compliant with flecainide and propanolol. She is tolerating medications well. She has also been fully compliant with Eliquis. She denies any abnormal bleeding. No falls. Her EKG today shows sinus rhythm with PACs. Heart rate is well controlled at 63 bpm. First-degree AV block is noted. Qt/QTc is  stable at 458/468. Her blood pressure is well-controlled.  06/08/2016 - this is a 2 months follow-up, patient comes accompanied by her husband, she has been doing really well from cardiac standpoint she denies any chest pain or shortness of breath, lower extremity edema has resolved, however she feels slightly down and she is worrying about starting any physical activity. She feels mild dizziness when standing or with activities but denies any presyncope or syncope. She is occasional few seconds lasting palpitations but nothing longer than that. She isn't compliant with her meds and has no side effects.  09/28/2016 - 4 months follow up, the patient came back from [place] a week ago, she has been feeling great and was exercising on a daily basis with a Physiological scientist. She denies any chest pain shortness of breath, palpitations or dizziness. She denies any bleeding on Eliquis. Just today she felt tired but no dizziness presyncope or syncope.  No outpatient prescriptions have been marked as taking for the 09/28/16 encounter (Appointment) with Christy Spark, MD.   Allergies  Allergen Reactions  . Epinephrine Palpitations   Past Medical History:  Diagnosis Date  . Bilateral lower extremity edema    Chronic, likely related to venous stasis   . DJD (degenerative joint disease), lumbosacral   . Dyslipidemia   . Essential hypertension   . GERD (gastroesophageal reflux disease)   . History of hepatitis C virus infection    In remission  . Lung granuloma ([place]) 2004    CT scan  . Osteopenia   . Paroxysmal atrial tachycardia ([place])   . PE (pulmonary embolism)   . Spondylolisthesis  Status post epidural injections 3 by neurosurgery  - Dr. Joya Salm   . Varicose veins of both legs with edema   . Vitamin D deficiency    Family History  Problem Relation Age of Onset  . Heart Problems Mother   . CVA Mother   . Heart attack Mother 59  . CVA Father   . Colon cancer Neg Hx   . Clotting disorder  Neg Hx    Past Surgical History:  Procedure Laterality Date  . CARDIOVERSION N/A 03/21/2016   Procedure: CARDIOVERSION;  Surgeon: Christy Spark, MD;  Location: [facility];  Service: Cardiovascular;  Laterality: N/A;  . COMBINED HYSTEROSCOPY DIAGNOSTIC / D&C  2005  . COMBINED HYSTEROSCOPY DIAGNOSTIC / D&C  1999  . NECK SURGERY  1995  . TOE SURGERY     replaced joint  . TUBAL LIGATION Bilateral 1976  . VAGINAL DELIVERY     x2   Social History   Social History  . Marital status: Married    Spouse name: N/A  . Number of children: N/A  . Years of education: N/A   Occupational History  . Not on file.   Social History Main Topics  . Smoking status: Never Smoker  . Smokeless tobacco: Never Used  . Alcohol use Yes     Comment: wine daily  . Drug use: No  . Sexual activity: Not on file     Comment: tubal ligation   Other Topics Concern  . Not on file   Social History Narrative   She is a married mother of 1. Lives with her husband. 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 She has never smoked and drinks up to 10 glasses of wine or so week.   She usually exercises roughly 3 days a week doing yoga and plays golf. She is mostly limited by her "time constraints "     Review of Systems: General: negative for chills, fever, night sweats or weight changes.  Cardiovascular: negative for chest pain, dyspnea on exertion, edema, orthopnea, palpitations, paroxysmal nocturnal dyspnea or shortness of breath Dermatological: negative for rash Respiratory: negative for cough or wheezing Urologic: negative for hematuria Abdominal: negative for nausea, vomiting, diarrhea, bright red blood per rectum, melena, or hematemesis Neurologic: negative for visual changes, syncope, or dizziness All other systems reviewed and are otherwise negative except as noted above.   Physical Exam:  There were no vitals taken for this visit.  General appearance: alert, cooperative and no  distress Neck: no carotid bruit and no JVD Lungs: clear to auscultation bilaterally Heart: regular rate and rhythm and with PACs Extremities: trace bilateral ankle edema, with vericosities noted Pulses: 2+ and symmetric Skin: warm and dry Neurologic: Grossly normal  EKG SR with PACs, 1st degree AVB. QT/QTc stable at 458/468 ms.     ASSESSMENT AND PLAN:   1. PAF: Failed DCCV, but successful chemical conversion with Flecainide. She remains in sinus rhythm. EKG shows Severe sinus bradycardia with ventricular rate 37 bpm. I would like to stop propranolol, however patient is very reluctant since she has been taking it for last 20 years, we'll decrease to 10 mg by mouth twice a day. She is advised to check her heartbeat at home and call us with the results and we will readjust as needed. She is fully anticoagulated with Eliquis. No bleeding.  2. PE/DVT: anticoagulated with Eliquis.   3. RV dilatation and and failure with acute pulmonary embolism in August 2017 - this has completely  resolved. Patient has no signs of right-sided heart failure.  4. Lower extremity edema - chronic venous stasis, she takes Lasix as needed she is advised to use compression stockings at night.  5. History of carotid stenosis - we will order bilateral carotid ultrasound.  PLAN  F/u with Dr. Meda Coffee in 4 weeks.   Ena Dawley , MD 09/28/2016 9:27 AM

## 2016-09-29 ENCOUNTER — Ambulatory Visit (HOSPITAL_COMMUNITY)
Admission: RE | Admit: 2016-09-29 | Discharge: 2016-09-29 | Disposition: A | Discharge status: Home / Self Care | Payer: PPO | Source: Ambulatory Visit | Attending: Cardiovascular Disease | Admitting: Cardiovascular Disease

## 2016-09-29 DIAGNOSIS — I6529 Occlusion and stenosis of unspecified carotid artery: Secondary | ICD-10-CM | POA: Insufficient documentation

## 2016-09-30 ENCOUNTER — Telehealth: Payer: Self-pay | Admitting: Cardiology

## 2016-09-30 NOTE — Telephone Encounter (Signed)
Walk In pt Form-questions about med change-placed in doc box.

## 2016-10-03 ENCOUNTER — Ambulatory Visit (INDEPENDENT_AMBULATORY_CARE_PROVIDER_SITE_OTHER): Payer: PPO | Admitting: Physician Assistant

## 2016-10-03 ENCOUNTER — Telehealth: Payer: Self-pay | Admitting: Cardiology

## 2016-10-03 ENCOUNTER — Encounter: Payer: Self-pay | Admitting: Physician Assistant

## 2016-10-03 VITALS — BP 148/86 | HR 64 | Ht 67.75 in | Wt 154.1 lb

## 2016-10-03 DIAGNOSIS — I2609 Other pulmonary embolism with acute cor pulmonale: Secondary | ICD-10-CM

## 2016-10-03 DIAGNOSIS — I10 Essential (primary) hypertension: Secondary | ICD-10-CM | POA: Diagnosis not present

## 2016-10-03 DIAGNOSIS — I2782 Chronic pulmonary embolism: Secondary | ICD-10-CM | POA: Diagnosis not present

## 2016-10-03 DIAGNOSIS — R001 Bradycardia, unspecified: Secondary | ICD-10-CM | POA: Diagnosis not present

## 2016-10-03 DIAGNOSIS — I480 Paroxysmal atrial fibrillation: Secondary | ICD-10-CM

## 2016-10-03 NOTE — Telephone Encounter (Signed)
New message   Pt is calling about her afib. She is requesting appt today.  Pt c/o BP issue: STAT if pt c/o blurred vision, one-sided weakness or slurred speech  1. What are your last 5 BP readings? 133/104 p-90 144/133   2. Are you having any other symptoms (ex. Dizziness, headache, blurred vision, passed out)? dizziness  3. What is your BP issue? Pt states she been having afib for 3 days. And her bp has been irregular and all over the place.

## 2016-10-03 NOTE — Progress Notes (Signed)
Cardiology Office Note    Date:  10/03/2016   ID:  Christy [name], DOB 1941-04-03, MRN [id number]  PCP:  Christy Low, MD  Cardiologist: Dr. Meda Coffee  Chief Complaint  Patient presents with  . Hypertension  . Atrial Fibrillation    History of Present Illness:  Christy [name] is a 76 y.o. female with history of atrial fib and PE/DVT 01/2016. She is on Eliquis and propanolol. Lasix for lower extremity edema and diastolic dysfunction. She underwent cardioversion to normal sinus rhythm 03/21/16 but reverted back to A. fib. She was then started on flecainide and underwent stress test that was negative for ischemia EF was normal.  Last saw Dr. Meda Coffee 09/28/16 and was doing well without palpitations and in normal sinus rhythm with PACs. She had some fatigue that day. EKG showed severe sinus bradycardia with ventricular rate at 37 bpm so she tried to stop her propanolol but patient was reluctant because she had been taking it for 20 years. She therefore decreased it to 10 mg twice a day.  Patient calls in today saying that her blood pressures been high 150/130 and she thinks she's had A. fib the past 3 days. She feels terrible. She says her blood pressures are going up because she is anxious. She is having some associated dizziness with it as well. She is usually very active but hasn't been able to do anything. EKG shows sinus bradycardia and then she goes into atrial fibrillation at a faster rate.  Past Medical History:  Diagnosis Date  . Bilateral lower extremity edema    Chronic, likely related to venous stasis   . DJD (degenerative joint disease), lumbosacral   . Dyslipidemia   . Essential hypertension   . GERD (gastroesophageal reflux disease)   . History of hepatitis C virus infection    In remission  . Lung granuloma ([place]) 2004    CT scan  . Osteopenia   . Paroxysmal atrial tachycardia ([place])   . PE (pulmonary embolism)   . Spondylolisthesis    Status post epidural injections 3 by  neurosurgery  - Dr. Joya Salm   . Varicose veins of both legs with edema   . Vitamin D deficiency     Past Surgical History:  Procedure Laterality Date  . CARDIOVERSION N/A 03/21/2016   Procedure: CARDIOVERSION;  Surgeon: [name], MD;  Location: [facility];  Service: Cardiovascular;  Laterality: N/A;  . COMBINED HYSTEROSCOPY DIAGNOSTIC / D&C  2005  . COMBINED HYSTEROSCOPY DIAGNOSTIC / D&C  1999  . NECK SURGERY  1995  . TOE SURGERY     replaced joint  . TUBAL LIGATION Bilateral 1976  . VAGINAL DELIVERY     x2    Current Medications: Outpatient Medications Prior to Visit  Medication Sig Dispense Refill  . apixaban (ELIQUIS) 5 MG TABS tablet TAKE 1 TABLET BY MOUTH TWICE DAILY(START 02/03/2016) 180 tablet 2  . furosemide (LASIX) 20 MG tablet Take 1 tablet (20 mg total) by mouth daily as needed for fluid or edema (weight gain of 3 lbs in 24 hrs or 5 lbs in 1 week). 30 tablet 3  . pantoprazole (PROTONIX) 40 MG tablet Take 40 mg by mouth daily.    . propranolol (INDERAL) 10 MG tablet Take 1 tablet (10 mg total) by mouth 2 (two) times daily. 180 tablet 2  . flecainide (TAMBOCOR) 100 MG tablet Take 1 tablet (100 mg total) by mouth 2 (two) times daily. 180 tablet 3   No facility-administered medications  prior to visit.      Allergies:   Epinephrine   Social History   Social History  . Marital status: Married    Spouse name: N/A  . Number of children: N/A  . Years of education: N/A   Social History Main Topics  . Smoking status: Never Smoker  . Smokeless tobacco: Never Used  . Alcohol use Yes     Comment: wine daily  . Drug use: No  . Sexual activity: Not Asked     Comment: tubal ligation   Other Topics Concern  . None   Social History Narrative   She is a married mother of 1. Lives with her husband. 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 She has never smoked and drinks up to 10 glasses of wine or so week.   She usually exercises roughly 3 days a week doing  yoga and plays golf. She is mostly limited by her "time constraints "     Family History:  The patient's  family history includes CVA in her father and mother; Heart Problems in her mother; Heart attack (age of onset: 91) in her mother.   ROS:   Please see the history of present illness.    Review of Systems  Constitution: Positive for weakness and malaise/fatigue.  HENT: Negative.   Eyes: Negative.   Cardiovascular: Positive for irregular heartbeat.  Respiratory: Negative.   Hematologic/Lymphatic: Negative.   Musculoskeletal: Negative.  Negative for joint pain.  Gastrointestinal: Negative.   Genitourinary: Negative.    All other systems reviewed and are negative.   PHYSICAL EXAM:   VS:  BP (!) 148/86 (BP Location: Left Arm, Cuff Size: Normal)   Pulse 64   Ht 5' 7.75" (1.721 m)   Wt 154 lb 1.9 oz (69.9 kg)   SpO2 98%   BMI 23.61 kg/m   Physical Exam  GEN: Well nourished, well developed, in no acute distress  HEENT: normal  Neck: no JVD, carotid bruits, or masses Cardiac:Irregular irregular; no murmurs, rubs, or gallops  Respiratory:  clear to auscultation bilaterally, normal work of breathing GI: soft, nontender, nondistended, + BS Ext: without cyanosis, clubbing, or edema, Good distal pulses bilaterally MS: no deformity or atrophy  Skin: warm and dry, no rash Neuro:  Alert and Oriented x 3, Strength and sensation are intact Psych: Anxious  Wt Readings from Last 3 Encounters:  10/03/16 154 lb 1.9 oz (69.9 kg)  09/28/16 156 lb (70.8 kg)  06/08/16 153 lb (69.4 kg)      Studies/Labs Reviewed:   EKG:  EKG is ordered today.  The ekg ordered today demonstrates Sinus bradycardia as alternating with A. fib at a faster rate  Recent Labs: 01/25/2016: TSH 2.609 02/05/2016: ALT 16; Brain Natriuretic Peptide 254.4 03/17/2016: BUN 20; Creat 0.89; Hemoglobin 11.8; Platelets 252; Potassium 3.8; Sodium 141   Lipid Panel No results found for: CHOL, TRIG, HDL, CHOLHDL, VLDL,  LDLCALC, LDLDIRECT  Additional studies/ records that were reviewed today include:  2-D echo 10/5/17Study Conclusions   - Left ventricle: The cavity size was normal. Wall thickness was   increased in a pattern of mild LVH. Systolic function was normal.   The estimated ejection fraction was in the range of 55% to 60%.   Wall motion was normal; there were no regional wall motion   abnormalities. The study is not technically sufficient to allow   evaluation of LV diastolic function. - Mitral valve: Calcified annulus. There was mild regurgitation. - Left atrium:  The atrium was severely dilated. - Right ventricle: The cavity size was mildly dilated. - Right atrium: The atrium was severely dilated. - Pulmonary arteries: Systolic pressure was mildly increased.   Impressions:   - Normal LV systolic function; severe biatrial enlargement; mild   RVE; mild MR.   Nuclear stress test 04/04/16   Nuclear stress EF: 57%.  There was no ST segment deviation noted during stress.  The study is normal.  This is a [name] risk study.  The left ventricular ejection fraction is normal (55-65%).   Normal resting and stress perfusion. No ischemia or infarction EF 57%   Carotid Dopplers reviewed 1-39% R ICA follow-up when necessary  ASSESSMENT:    1. Paroxysmal atrial fibrillation (HCC)   2. Bradycardia   3. Essential hypertension   4. Other chronic pulmonary embolism with acute cor pulmonale (HCC)      PLAN:  In order of problems listed above:  Atrial fibrillation with fast rates on flecainide alternating with sinus bradycardia. Discussed in detail with Dr. Rayann Heman who reviewed her in detail including her echo that shows severely dilated left atrium. He recommends she stop flecainide today, come to the A. fib clinic on Thursday and be admitted to the hospital for [place]. We will check a potassium and magnesium today. If she has fast heart rates in the interim she can take extra propanolol, if  she has sustained fast rates she may have to come to the hospital.Patient is on Eliquis and hasn't missed any doses.  Sinus bradycardia down to 37 last office visit therefore propanolol decreased  Essential hypertension patient's blood pressures have been as [name] as 90/60 but are jumping up to 150/100. Most of this is because of stress and anxiety related to her atrial fibrillation. We'll not add any new medications at this time. 2 g sodium diet advised. Check with primary care for anxiolytics if needed.  DVT/PE 03/2016 on Eliquis  Medication Adjustments/Labs and Tests Ordered: Current medicines are reviewed at length with the patient today.  Concerns regarding medicines are outlined above.  Medication changes, Labs and Tests ordered today are listed in the Patient Instructions below. Patient Instructions  Medication Instructions:  Your physician has recommended you make the following change in your medication:  STOP Flecainide   Labwork: None orderd  Testing/Procedures: None ordered  Follow-Up: Follow up on 10/06/16 @10am  In the AFIB Clinic. You have been given a hand out with directions to the clinic.  Any Other Special Instructions Will Be Listed Below (If Applicable).     If you need a refill on your cardiac medications before your next appointment, please call your pharmacy.      Sumner Boast, PA-C  10/03/2016 4:57 PM    [facility] [address], [place], [place]  [address] Phone: [phone]; Fax: [phone]

## 2016-10-03 NOTE — Patient Instructions (Addendum)
Medication Instructions:  Your physician has recommended you make the following change in your medication:  STOP Flecainide   Labwork: None orderd  Testing/Procedures: None ordered  Follow-Up: Follow up on 10/06/16 @10am  In the AFIB Clinic. You have been given a hand out with directions to the clinic.  Any Other Special Instructions Will Be Listed Below (If Applicable).     If you need a refill on your cardiac medications before your next appointment, please call your pharmacy.

## 2016-10-03 NOTE — Telephone Encounter (Signed)
SPOKE WITH  PT.PT    HAS  CONCERNS  RE  ELEVATED  B/P .PT  WANTING TO  BE SEEN   OR WILL GO  TO  ER  IF NEEDED .APPT MADE  WITH MICHELE LENZE PA   FOR  TODAY AT  4:00 PM . PT  AWARE

## 2016-10-04 ENCOUNTER — Telehealth: Payer: Self-pay | Admitting: Pharmacist

## 2016-10-04 LAB — SPECIMEN STATUS

## 2016-10-04 LAB — BASIC METABOLIC PANEL
BUN/Creatinine Ratio: 25 (ref 12–28)
BUN: 24 mg/dL (ref 8–27)
CO2: 23 mmol/L (ref 18–29)
Calcium: 9.9 mg/dL (ref 8.7–10.3)
Chloride: 102 mmol/L (ref 96–106)
Creatinine, Ser: 0.96 mg/dL (ref 0.57–1.00)
GFR calc Af Amer: 66 mL/min/{1.73_m2} (ref >59)
GFR calc non Af Amer: 57 mL/min/{1.73_m2} — ABNORMAL LOW (ref >59)
Glucose: 94 mg/dL (ref 65–99)
Potassium: 4.3 mmol/L (ref 3.5–5.2)
Sodium: 143 mmol/L (ref 134–144)

## 2016-10-04 LAB — MAGNESIUM: Magnesium: 2.1 mg/dL (ref 1.6–2.3)

## 2016-10-04 NOTE — Telephone Encounter (Signed)
Medication list reviewed for pending Tikosyn initiation. Patient does not take any contraindicated or QTc prolonging medications. She is therapeutically anticoagulated on Eliquis 5mg  BID (age < 80, SCr < 1.5, wt > 60kg). Will need to ensure pt has not missed any doses of Eliquis in the 3 weeks prior to Tikosyn initiation.

## 2016-10-06 ENCOUNTER — Encounter (HOSPITAL_COMMUNITY): Payer: Self-pay | Admitting: Nurse Practitioner

## 2016-10-06 ENCOUNTER — Inpatient Hospital Stay (HOSPITAL_COMMUNITY)
Admission: AD | Admit: 2016-10-06 | Discharge: 2016-10-09 | DRG: 310 | Disposition: A | Discharge status: Home / Self Care | Payer: PPO | Source: Ambulatory Visit | Attending: Internal Medicine | Admitting: Internal Medicine

## 2016-10-06 ENCOUNTER — Ambulatory Visit (HOSPITAL_COMMUNITY)
Admission: RE | Admit: 2016-10-06 | Discharge: 2016-10-06 | Disposition: A | Discharge status: Home / Self Care | Payer: PPO | Source: Ambulatory Visit | Attending: Nurse Practitioner | Admitting: Nurse Practitioner

## 2016-10-06 VITALS — BP 114/72 | HR 61 | Ht 67.75 in | Wt 152.2 lb

## 2016-10-06 DIAGNOSIS — E785 Hyperlipidemia, unspecified: Secondary | ICD-10-CM | POA: Diagnosis present

## 2016-10-06 DIAGNOSIS — Z888 Allergy status to other drugs, medicaments and biological substances status: Secondary | ICD-10-CM | POA: Diagnosis not present

## 2016-10-06 DIAGNOSIS — Z79899 Other long term (current) drug therapy: Secondary | ICD-10-CM | POA: Diagnosis not present

## 2016-10-06 DIAGNOSIS — Z5181 Encounter for therapeutic drug level monitoring: Secondary | ICD-10-CM | POA: Diagnosis not present

## 2016-10-06 DIAGNOSIS — Z86711 Personal history of pulmonary embolism: Secondary | ICD-10-CM | POA: Diagnosis not present

## 2016-10-06 DIAGNOSIS — M8580 Other specified disorders of bone density and structure, unspecified site: Secondary | ICD-10-CM | POA: Diagnosis not present

## 2016-10-06 DIAGNOSIS — I340 Nonrheumatic mitral (valve) insufficiency: Secondary | ICD-10-CM | POA: Diagnosis present

## 2016-10-06 DIAGNOSIS — Z8249 Family history of ischemic heart disease and other diseases of the circulatory system: Secondary | ICD-10-CM | POA: Diagnosis not present

## 2016-10-06 DIAGNOSIS — I480 Paroxysmal atrial fibrillation: Principal | ICD-10-CM | POA: Diagnosis present

## 2016-10-06 DIAGNOSIS — Z823 Family history of stroke: Secondary | ICD-10-CM

## 2016-10-06 DIAGNOSIS — M4310 Spondylolisthesis, site unspecified: Secondary | ICD-10-CM | POA: Diagnosis present

## 2016-10-06 DIAGNOSIS — E559 Vitamin D deficiency, unspecified: Secondary | ICD-10-CM | POA: Diagnosis present

## 2016-10-06 DIAGNOSIS — I10 Essential (primary) hypertension: Secondary | ICD-10-CM

## 2016-10-06 DIAGNOSIS — K219 Gastro-esophageal reflux disease without esophagitis: Secondary | ICD-10-CM | POA: Diagnosis present

## 2016-10-06 DIAGNOSIS — Z7901 Long term (current) use of anticoagulants: Secondary | ICD-10-CM | POA: Diagnosis not present

## 2016-10-06 DIAGNOSIS — I481 Persistent atrial fibrillation: Secondary | ICD-10-CM

## 2016-10-06 HISTORY — DX: Unspecified atrial fibrillation: I48.91

## 2016-10-06 LAB — MAGNESIUM: Magnesium: 2 mg/dL (ref 1.7–2.4)

## 2016-10-06 LAB — BASIC METABOLIC PANEL
Anion gap: 6 (ref 5–15)
BUN: 24 mg/dL — ABNORMAL HIGH (ref 6–20)
CO2: 28 mmol/L (ref 22–32)
Calcium: 9.7 mg/dL (ref 8.9–10.3)
Chloride: 104 mmol/L (ref 101–111)
Creatinine, Ser: 0.91 mg/dL (ref 0.44–1.00)
GFR calc Af Amer: >60 mL/min (ref >60)
GFR calc non Af Amer: 59 mL/min — ABNORMAL LOW (ref >60)
Glucose, Bld: 109 mg/dL — ABNORMAL HIGH (ref 65–99)
Potassium: 4 mmol/L (ref 3.5–5.1)
Sodium: 138 mmol/L (ref 135–145)

## 2016-10-06 MED ORDER — PANTOPRAZOLE SODIUM 40 MG PO TBEC
40.0000 mg | DELAYED_RELEASE_TABLET | Freq: Every day | ORAL | Status: DC
  Administered 2016-10-07 – 2016-10-08 (×2): 40 mg via ORAL
  Filled 2016-10-06 (×2): qty 1

## 2016-10-06 MED ORDER — DOFETILIDE 250 MCG PO CAPS
250.0000 ug | ORAL_CAPSULE | Freq: Two times a day (BID) | ORAL | Status: DC
  Administered 2016-10-06 – 2016-10-09 (×6): 250 ug via ORAL
  Filled 2016-10-06 (×6): qty 1

## 2016-10-06 MED ORDER — APIXABAN 5 MG PO TABS
5.0000 mg | ORAL_TABLET | Freq: Two times a day (BID) | ORAL | Status: DC
  Administered 2016-10-06 – 2016-10-09 (×6): 5 mg via ORAL
  Filled 2016-10-06 (×6): qty 1

## 2016-10-06 MED ORDER — PROPRANOLOL HCL 10 MG PO TABS
10.0000 mg | ORAL_TABLET | Freq: Two times a day (BID) | ORAL | Status: DC
  Administered 2016-10-06 – 2016-10-09 (×6): 10 mg via ORAL
  Filled 2016-10-06 (×7): qty 1

## 2016-10-06 MED ORDER — SODIUM CHLORIDE 0.9% FLUSH: 3.0000 mL | INTRAVENOUS | Status: DC | PRN

## 2016-10-06 MED ORDER — SODIUM CHLORIDE 0.9% FLUSH
3.0000 mL | Freq: Two times a day (BID) | INTRAVENOUS | Status: DC
  Administered 2016-10-06 – 2016-10-08 (×5): 3 mL via INTRAVENOUS

## 2016-10-06 MED ORDER — SODIUM CHLORIDE 0.9 % IV SOLN: 250.0000 mL | INTRAVENOUS | Status: DC | PRN

## 2016-10-06 MED ORDER — APIXABAN 5 MG PO TABS: 5.0000 mg | ORAL_TABLET | Freq: Two times a day (BID) | ORAL | Status: DC

## 2016-10-06 NOTE — Progress Notes (Signed)
Primary Care Physician: Wenda Low, MD Referring Physician: Dr. Caprice Renshaw Christy [name] is a 76 y.o. female with a h/o atrial fib and PE/DVT 01/2016. She is on Eliquis and propanolol, lasix for lower extremity edema and diastolic dysfunction. She underwent cardioversion to normal sinus rhythm 03/21/16 but reverted back to A. fib. She was then started on flecainide and underwent stress test that was negative for ischemia, EF normal.  Last saw Dr. Meda Coffee 09/28/16 and was doing well without palpitations and in normal sinus rhythm with PACs. She had some fatigue that day. EKG showed severe sinus bradycardia with ventricular rate at 37 bpm so Dr. Meda Coffee tried to stop her propanolol but patient was reluctant because she had been taking it for 20 years. She therefore decreased it to 10 mg twice a day.  Patient later called in  saying that her blood pressures had been high 150/130 and she thought she  had A. fib the past 3 days and felt terrible. She says her blood pressures are going up because she is anxious. She is having some associated dizziness with it as well. She is usually very active but hasn't been able to do anything. EKG shows sinus bradycardia and then she goes into atrial fibrillation at a faster rate.  She is in the afib clinic today for Tikosyn initiation. Flecainide stopped several days ago. Tikosyn precautions discussed in detail with pt. They have not found out the cost of the drug, but do not seem to think cost to be a factor.Her meds have been reviewed by Fuller Canada, PharmD and no qtc prolonging drugs on board. QTc is acceptable at 432 ms in SR this am. She is drinking 7-10 glasses a wine a week and we discussed how alcohol can contribute to afib as well increase bleeding risk. She is active and normal weight. She does not smoke or use caffeine. No missed doses of eliquis for at least 3 weeks.. Crcl cal at 52.57.   Today, she denies symptoms of palpitations, chest pain, shortness  of breath, orthopnea, PND, lower extremity edema, dizziness, presyncope, syncope, or neurologic sequela. The patient is tolerating medications without difficulties and is otherwise without complaint today.   Past Medical History:  Diagnosis Date  . Bilateral lower extremity edema    Chronic, likely related to venous stasis   . DJD (degenerative joint disease), lumbosacral   . Dyslipidemia   . Essential hypertension   . GERD (gastroesophageal reflux disease)   . History of hepatitis C virus infection    In remission  . Lung granuloma ([place]) 2004    CT scan  . Osteopenia   . Paroxysmal atrial tachycardia ([place])   . PE (pulmonary embolism)   . Spondylolisthesis    Status post epidural injections 3 by neurosurgery  - Dr. Joya Salm   . Varicose veins of both legs with edema   . Vitamin D deficiency    Past Surgical History:  Procedure Laterality Date  . CARDIOVERSION N/A 03/21/2016   Procedure: CARDIOVERSION;  Surgeon: [name], MD;  Location: [facility];  Service: Cardiovascular;  Laterality: N/A;  . COMBINED HYSTEROSCOPY DIAGNOSTIC / D&C  2005  . COMBINED HYSTEROSCOPY DIAGNOSTIC / D&C  1999  . NECK SURGERY  1995  . TOE SURGERY     replaced joint  . TUBAL LIGATION Bilateral 1976  . VAGINAL DELIVERY     x2    Current Outpatient Prescriptions  Medication Sig Dispense Refill  . apixaban (ELIQUIS) 5 MG TABS  tablet TAKE 1 TABLET BY MOUTH TWICE DAILY(START 02/03/2016) 180 tablet 2  . furosemide (LASIX) 20 MG tablet Take 1 tablet (20 mg total) by mouth daily as needed for fluid or edema (weight gain of 3 lbs in 24 hrs or 5 lbs in 1 week). 30 tablet 3  . pantoprazole (PROTONIX) 40 MG tablet Take 40 mg by mouth daily.    . propranolol (INDERAL) 10 MG tablet Take 1 tablet (10 mg total) by mouth 2 (two) times daily. 180 tablet 2   No current facility-administered medications for this encounter.     Allergies  Allergen Reactions  . Epinephrine Palpitations    Social History     Social History  . Marital status: Married    Spouse name: N/A  . Number of children: N/A  . Years of education: N/A   Occupational History  . Not on file.   Social History Main Topics  . Smoking status: Never Smoker  . Smokeless tobacco: Never Used  . Alcohol use Yes     Comment: wine daily  . Drug use: No  . Sexual activity: Not on file     Comment: tubal ligation   Other Topics Concern  . Not on file   Social History Narrative   She is a married mother of 1. Lives with her husband. 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 She has never smoked and drinks up to 10 glasses of wine or so week.   She usually exercises roughly 3 days a week doing yoga and plays golf. She is mostly limited by her "time constraints "    Family History  Problem Relation Age of Onset  . Heart Problems Mother   . CVA Mother   . Heart attack Mother 42  . CVA Father   . Colon cancer Neg Hx   . Clotting disorder Neg Hx     ROS- All systems are reviewed and negative except as per the HPI above  Physical Exam: Vitals:   10/06/16 0957  BP: 114/72  Pulse: 61  Weight: 152 lb 3.2 oz (69 kg)  Height: 5' 7.75" (1.721 m)   Wt Readings from Last 3 Encounters:  10/06/16 152 lb 3.2 oz (69 kg)  10/03/16 154 lb 1.9 oz (69.9 kg)  09/28/16 156 lb (70.8 kg)    Labs: Lab Results  Component Value Date   NA 138 10/06/2016   K 4.0 10/06/2016   CL 104 10/06/2016   CO2 28 10/06/2016   GLUCOSE 109 (H) 10/06/2016   BUN 24 (H) 10/06/2016   CREATININE 0.91 10/06/2016   CALCIUM 9.7 10/06/2016   MG 2.0 10/06/2016   Lab Results  Component Value Date   INR 1.1 03/17/2016   No results found for: CHOL, HDL, LDLCALC, TRIG   GEN- The patient is well appearing, alert and oriented x 3 today.   Head- normocephalic, atraumatic Eyes-  Sclera clear, conjunctiva pink Ears- hearing intact Oropharynx- clear Neck- supple, no JVP Lymph- no cervical lymphadenopathy Lungs- Clear to ausculation  bilaterally, normal work of breathing Heart- Regular rate and rhythm, no murmurs, rubs or gallops, PMI not laterally displaced GI- soft, NT, ND, + BS Extremities- no clubbing, cyanosis, or edema MS- no significant deformity or atrophy Skin- no rash or lesion Psych- euthymic mood, full affect Neuro- strength and sensation are intact  EKG-NSR with first degree AV block, pr int 250 ms, qrs int 82 ms, qtc 432 ms Echo-LV EF: 55% -   60%  -------------------------------------------------------------------  Indications:      Pulmonary Embolism (I26.99).  ------------------------------------------------------------------- History:   PMH:  Pulmonary Embolism, Atrial Tachycardia, Hepatitis C, Edema.  Atrial fibrillation.  Risk factors:  Family history of coronary artery disease. Hypertension. Dyslipidemia.  ------------------------------------------------------------------- Study Conclusions  - Left ventricle: The cavity size was normal. Wall thickness was   increased in a pattern of mild LVH. Systolic function was normal.   The estimated ejection fraction was in the range of 55% to 60%.   Wall motion was normal; there were no regional wall motion   abnormalities. The study is not technically sufficient to allow   evaluation of LV diastolic function. - Mitral valve: Calcified annulus. There was mild regurgitation. - Left atrium: The atrium was severely dilated. - Right ventricle: The cavity size was mildly dilated. - Right atrium: The atrium was severely dilated. - Pulmonary arteries: Systolic pressure was mildly increased.  Impressions:  - Normal LV systolic function; severe biatrial enlargement; L atrium 36mm, mild   RVE; mild MR.     Assessment and Plan: 1. Paroxysmal atrial fibrillation Off flecainide Tikosyn precautions discussed No qtc prolonging drugs on board No missed doses of eliquis for at least 3 weeks Alcohol reduction discussed   To be admitted to [facility] for  tikosyn initiation  bmet/mag drwan with k+4. And mag at 2.0  Butch Penny C. Joliene Salvador, [facility] [address] [place], [place] [address] [phone]

## 2016-10-06 NOTE — Progress Notes (Addendum)
10/06/2016 1120 Received Direct Admit to 2W11 from home.  Pt is here for Tikosyn Load.  Tele monitor applied and CCMD notified.  Pt is A&O, no C/O voiced.  Oriented to room, call light and bed.  Tommye Standard PA-C has been notified of pt's arrival.  Call bell in reach, family at bedside. [place]

## 2016-10-06 NOTE — H&P (Signed)
H&P    Patient ID: Christy [name] MRN: [id number], DOB/AGE: 1941/06/16 76 y.o.  Admit date: 10/06/2016 Date of Consult: 10/06/2016   Primary Physician: Wenda Low, MD Primary Cardiologist: Dr. Meda Coffee  Reason for Adnission: Tikosyn initiation  HPI: Christy [name] is a 76 y.o. female  With PMHx of very remote dx of PAT, more recently AFib noted at the time of DVT/PE Aug 2017, on full a/c, in follow up did well without AF though reported fatigue and dizziness, saw Dr. Meda Coffee 09/28/16 and noted SB 38bpm and her Propaolol was decreased, a few days later feeling terrible and suspected she was out of rhythm.  10/03/16 was seen by Gerrianne Scale, PA noted to have SB as  AFib with faster rates the case reviewed with Dr. Rayann Heman and decided to stop Flecainide with plans for Tikosyn.  The patient reported no missed doses of her Eliquis in the laast 4 weeks.  Today she was seen in the AF clinic, [facility] teaching done, and med list reviewed with [facility], and planned for admission.  AF Hx: DCCV 03/21/16 failed >> Flecainide >> SR  Flecainide stopped 10/03/16 (last dose was AM dose)  The patient is feeling well, we have reviewed Tikosyn protocol, potential pro-arrhythmic QT prolongation, she states understanding and wants to proceed, she confirms with me no missed doses of her Eliquis (arrives in SR), since it was started.  She is feeling well today without complaints.  She is aware of her AF when she has it, makes her feel particularly fatigued, as well as increased sense of anxiety and her BP tends to be quite high when in AF as well.    Past Medical History:  Diagnosis Date  . Bilateral lower extremity edema    Chronic, likely related to venous stasis   . DJD (degenerative joint disease), lumbosacral   . Dyslipidemia   . Essential hypertension   . GERD (gastroesophageal reflux disease)   . History of hepatitis C virus infection    In remission  . Lung granuloma ([place]) 2004    CT scan  . Osteopenia   .  Paroxysmal atrial tachycardia ([place])   . PE (pulmonary embolism)   . Spondylolisthesis    Status post epidural injections 3 by neurosurgery  - Dr. Joya Salm   . Varicose veins of both legs with edema   . Vitamin D deficiency      Surgical History:  Past Surgical History:  Procedure Laterality Date  . CARDIOVERSION N/A 03/21/2016   Procedure: CARDIOVERSION;  Surgeon: [name], MD;  Location: [place];  Service: Cardiovascular;  Laterality: N/A;  . COMBINED HYSTEROSCOPY DIAGNOSTIC / D&C  2005  . COMBINED HYSTEROSCOPY DIAGNOSTIC / D&C  1999  . NECK SURGERY  1995  . TOE SURGERY     replaced joint  . TUBAL LIGATION Bilateral 1976  . VAGINAL DELIVERY     x2     Prescriptions Prior to Admission  Medication Sig Dispense Refill Last Dose  . apixaban (ELIQUIS) 5 MG TABS tablet TAKE 1 TABLET BY MOUTH TWICE DAILY(START 02/03/2016) 180 tablet 2 Taking  . furosemide (LASIX) 20 MG tablet Take 1 tablet (20 mg total) by mouth daily as needed for fluid or edema (weight gain of 3 lbs in 24 hrs or 5 lbs in 1 week). 30 tablet 3 Taking  . pantoprazole (PROTONIX) 40 MG tablet Take 40 mg by mouth daily.   Taking  . propranolol (INDERAL) 10 MG tablet Take 1 tablet (10 mg total)  by mouth 2 (two) times daily. 180 tablet 2 Taking    Inpatient Medications:   Allergies:  Allergies  Allergen Reactions  . Epinephrine Palpitations    Social History   Social History  . Marital status: Married    Spouse name: N/A  . Number of children: N/A  . Years of education: N/A   Occupational History  . Not on file.   Social History Main Topics  . Smoking status: Never Smoker  . Smokeless tobacco: Never Used  . Alcohol use Yes     Comment: wine daily  . Drug use: No  . Sexual activity: Not on file     Comment: tubal ligation   Other Topics Concern  . Not on file   Social History Narrative   She is a married mother of 1. Lives with her husband. 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  She has never smoked and drinks up to 10 glasses of wine or so week.   She usually exercises roughly 3 days a week doing yoga and plays golf. She is mostly limited by her "time constraints "     Family History  Problem Relation Age of Onset  . Heart Problems Mother   . CVA Mother   . Heart attack Mother 67  . CVA Father   . Colon cancer Neg Hx   . Clotting disorder Neg Hx      Review of Systems: All other systems reviewed and are otherwise negative except as noted above.  Physical Exam: Vitals:   10/06/16 1126  BP: 120/69  Pulse: 60  Resp: 18  Temp: 97.6 F (36.4 C)  TempSrc: Oral  SpO2: 98%  Weight: 152 lb 8.9 oz (69.2 kg)  Height: 5' 7.75" (1.721 m)    GEN- The patient is well appearing, alert and oriented x 3 today.   HEENT: normocephalic, atraumatic; sclera clear, conjunctiva pink; hearing intact; oropharynx clear; neck supple, no JVP Lymph- no cervical lymphadenopathy Lungs- CTA, normal work of breathing.  No wheezes, rales, rhonchi Heart- RRR, 1/6 SM, rubs or gallops, PMI not laterally displaced GI- soft, non-tender, non-distended Extremities- no clubbing, cyanosis, or edema, varicocities appreciated MS- no significant deformity or atrophy Skin- warm and dry, no rash or lesion Psych- euthymic mood, full affect Neuro- no gross deficits observed  Labs:   Lab Results  Component Value Date   WBC WILL FOLLOW 10/03/2016   HGB 11.8 03/17/2016   HCT WILL FOLLOW 10/03/2016   MCV WILL FOLLOW 10/03/2016   PLT WILL FOLLOW 10/03/2016     Recent Labs Lab 10/06/16 0954  NA 138  K 4.0  CL 104  CO2 28  BUN 24*  CREATININE 0.91  CALCIUM 9.7  GLUCOSE 109*      Reviewed by myself: EKG:SR, 61bpm, 1st degee AVBlock, PR 252ms, QRS 59ms, Qtc 444ms TELEMETRY: SR, 60's  2-D echo 10/5/17Study Conclusions - Left ventricle: The cavity size was normal. Wall thickness was increased in a pattern of mild LVH. Systolic function was normal. The estimated ejection  fraction was in the range of 55% to 60%. Wall motion was normal; there were no regional wall motion abnormalities. The study is not technically sufficient to allow evaluation of LV diastolic function. - Mitral valve: Calcified annulus. There was mild regurgitation. - Left atrium: The atrium was severely dilated. (47mm) - Right ventricle: The cavity size was mildly dilated. - Right atrium: The atrium was severely dilated. - Pulmonary arteries: Systolic pressure was mildly increased. Impressions: -  Normal LV systolic function; severe biatrial enlargement; mild RVE; mild MR.   Nuclear stress test 04/04/16  Nuclear stress EF: 57%.  There was no ST segment deviation noted during stress.  The study is normal.  This is a low risk study.  The left ventricular ejection fraction is normal (55-65%). Normal resting and stress perfusion. No ischemia or infarction EF 57%   Carotid Dopplers reviewed 1-39% R ICA follow-up when necessary    Assessment and Plan:   1. Paroxysmal AFib     Tiksoyn initiation     CHA2DS2Vasc is at least 3 on Eliquis     K+ 4.0     Mag 2.o     Creat 0.91 (Calc Cr Cl = 56)      QTc 443ms  Given Creat clearance, staring dose 222mcg, I will discuss with Dr. Rayann Heman, consideration of 386mcg.  Severely dilated LA, 58mm and ETOH consumption (counseled) may make maintaining SR difficult, though she is quite symptomatic with it and will pursue ongoing rhythm control strategies.  2. HTN     Continue home propanolol     She denies an offical dx of HTN, says initially rx for PAT  Signed, Tommye Standard, PA-C 10/06/2016 12:59 PM   I have seen, examined the patient, and reviewed the above assessment and plan.  Changes to above are made where necessary.   On exam, RRR.  She is now in sinus.  Reports compliance with anticoagulation.  She has failed medical therapy with flecainide.  Will therefore start tikosyn.   Follow CrCl and Qt closely while here.   Co  Sign: [name], MD 10/06/2016 11:23 PM

## 2016-10-06 NOTE — Progress Notes (Signed)
Pharmacy Review for Dofetilide (Tikosyn) Initiation  Admit Complaint: 76 y.o. female admitted 10/06/2016 with atrial fibrillation to be initiated on dofetilide.   Assessment:  Patient Exclusion Criteria: If any screening criteria checked as "Yes", then  patient  should NOT receive dofetilide until criteria item is corrected. If "Yes" please indicate correction plan.  YES  NO Patient  Exclusion Criteria Correction Plan  []  [x]  Baseline QTc interval is greater than or equal to 440 msec. IF above YES box checked dofetilide contraindicated unless patient has ICD; then may proceed if QTc 500-550 msec or with known ventricular conduction abnormalities may proceed with QTc 550-600 msec. QTc = 432   []  [x]  Magnesium level is less than 1.8 mEq/l : Last magnesium:  Lab Results  Component Value Date   MG 2.0 10/06/2016         []  [x]  Potassium level is less than 4 mEq/l : Last potassium:  Lab Results  Component Value Date   K 4.0 10/06/2016         []  [x]  Patient is known or suspected to have a digoxin level greater than 2 ng/ml: No results found for: DIGOXIN    []  [x]  Creatinine clearance less than 20 ml/min (calculated using Cockcroft-Gault, actual body weight and serum creatinine): Estimated Creatinine Clearance: 50.9 mL/min (by C-G formula based on SCr of 0.91 mg/dL).    []  [x]  Patient has received drugs known to prolong the QT intervals within the last 48 hours (phenothiazines, tricyclics or tetracyclic antidepressants, erythromycin, H-1 antihistamines, cisapride, fluoroquinolones, azithromycin). Drugs not listed above may have an, as yet, undetected potential to prolong the QT interval, updated information on QT prolonging agents is available at this website:QT prolonging agents   []  [x]  Patient received a dose of hydrochlorothiazide (Oretic) alone or in any combination including triamterene (Dyazide, Maxzide) in the last 48 hours.   []  [x]  Patient received a medication known to increase  dofetilide plasma concentrations prior to initial dofetilide dose:  . Trimethoprim (Primsol, Proloprim) in the last 36 hours . Verapamil (Calan, Verelan) in the last 36 hours or a sustained release dose in the last 72 hours . Megestrol (Megace) in the last 5 days  . Cimetidine (Tagamet) in the last 6 hours . Ketoconazole (Nizoral) in the last 24 hours . Itraconazole (Sporanox) in the last 48 hours  . Prochlorperazine (Compazine) in the last 36 hours    []  [x]  Patient is known to have a history of torsades de pointes; congenital or acquired long QT syndromes.   []  [x]  Patient has received a Class 1 antiarrhythmic with less than 2 half-lives since last dose. (Disopyramide, Quinidine, Procainamide, Lidocaine, Mexiletine, Flecainide, Propafenone) Flecainide stopped 4/16 AM  []  [x]  Patient has received amiodarone therapy in the past 3 months or amiodarone level is greater than 0.3 ng/ml.    Patient has been appropriately anticoagulated with apixaban.  Ordering provider was confirmed at [email or web address] if they are not listed on the [place] Prescribers list.  Goal of Therapy: Follow renal function, electrolytes, potential drug interactions, and dose adjustment. Provide education and 1 week supply at discharge.  Plan:  [x]   Physician selected initial dose within range recommended for patients level of renal function - will monitor for response.  []   Physician selected initial dose outside of range recommended for patients level of renal function - will discuss if the dose should be altered at this time.   Select One Calculated CrCl  Dose q12h  []  > 60 ml/min 500  mcg  [x]  40-60 ml/min 250 mcg  []  20-40 ml/min 125 mcg   2. Follow up QTc after the first 5 doses, renal function, electrolytes (K & Mg) daily x 3     days, dose adjustment, success of initiation and facilitate 1 week discharge supply as     clinically indicated.  3. Initiate Tikosyn education video (Call [phone] and ask  for Tikosyn Video # 116).  4. Place Enrollment Form on the chart for discharge supply of dofetilide.    Elicia Lamp, PharmD, BCPS Clinical Pharmacist 10/06/2016 1:18 PM

## 2016-10-07 ENCOUNTER — Encounter (HOSPITAL_COMMUNITY): Payer: Self-pay | Admitting: General Practice

## 2016-10-07 LAB — BASIC METABOLIC PANEL
Anion gap: 8 (ref 5–15)
BUN: 19 mg/dL (ref 6–20)
CO2: 25 mmol/L (ref 22–32)
Calcium: 9.3 mg/dL (ref 8.9–10.3)
Chloride: 107 mmol/L (ref 101–111)
Creatinine, Ser: 0.77 mg/dL (ref 0.44–1.00)
GFR calc Af Amer: >60 mL/min (ref >60)
GFR calc non Af Amer: >60 mL/min (ref >60)
Glucose, Bld: 81 mg/dL (ref 65–99)
Potassium: 3.7 mmol/L (ref 3.5–5.1)
Sodium: 140 mmol/L (ref 135–145)

## 2016-10-07 LAB — MAGNESIUM: Magnesium: 2 mg/dL (ref 1.7–2.4)

## 2016-10-07 MED ORDER — POTASSIUM CHLORIDE CRYS ER 20 MEQ PO TBCR
40.0000 meq | EXTENDED_RELEASE_TABLET | Freq: Once | ORAL | Status: AC
  Administered 2016-10-07: 40 meq via ORAL
  Filled 2016-10-07: qty 2

## 2016-10-07 NOTE — Progress Notes (Signed)
   SUBJECTIVE: The patient is doing well today.  At this time, she denies chest pain, shortness of breath, or any new concerns.  Marland Kitchen apixaban  5 mg Oral BID  . dofetilide  250 mcg Oral BID  . pantoprazole  40 mg Oral Daily  . propranolol  10 mg Oral BID  . sodium chloride flush  3 mL Intravenous Q12H   . sodium chloride      OBJECTIVE: Physical Exam: Vitals:   10/06/16 2038 10/07/16 0440 10/07/16 0529 10/07/16 0822  BP: 122/71 138/61 (!) 144/61 (!) 120/53  Pulse: (!) 59 61 60 62  Resp: 18 18  18   Temp: 97.8 F (36.6 C) 97.5 F (36.4 C)  98 F (36.7 C)  TempSrc: Oral Oral  Oral  SpO2: 100% 97%  100%  Weight:      Height:        Intake/Output Summary (Last 24 hours) at 10/07/16 0859 Last data filed at 10/07/16 0800  Gross per 24 hour  Intake              240 ml  Output                0 ml  Net              240 ml    Telemetry reveals sinus rhythm  GEN- The patient is well appearing, alert and oriented x 3 today.   Head- normocephalic, atraumatic Eyes-  Sclera clear, conjunctiva pink Ears- hearing intact Oropharynx- clear Neck- supple,  Lungs- Clear to ausculation bilaterally, normal work of breathing Heart- Regular rate and rhythm, no murmurs, rubs or gallops, PMI not laterally displaced GI- soft, NT, ND, + BS Extremities- no clubbing, cyanosis, + 2 chronic edema edema Skin- no rash or lesion Psych- euthymic mood, full affect Neuro- strength and sensation are intact  LABS: Basic Metabolic Panel:  Recent Labs  10/06/16 0954 10/07/16 0332  NA 138 140  K 4.0 3.7  CL 104 107  CO2 28 25  GLUCOSE 109* 81  BUN 24* 19  CREATININE 0.91 0.77  CALCIUM 9.7 9.3  MG 2.0 2.0   ASSESSMENT AND PLAN:   1. afib Doing well with tikosyn Qt is stable at about 460 msec Continue current therapy  2. HTN Stable No change required today  Anticipate discharge to home on Sunday   [name], MD 10/07/2016 8:59 AM

## 2016-10-07 NOTE — Consult Note (Signed)
[facility] Primary Care Navigator  10/07/2016  Christy [name] 12/29/1940 [id number]   Met with patient and husband Christy [name]) at the bedside to identify possible discharge needs. Patient reports having irregular "fast" heartbeats which had led to this admission (here for Tikosyn trial).  Patient endorses Dr. Wenda Low with [facility] [place] as the primary care provider.    Patient shared using [facility] [place] to obtain medications without difficulty.   Patient is managing her own medications at home using "pill box" system weekly.   She reports being able to drive prior to admission. Husband will be able to provide transportation to her doctors' appointments if needed.  Patient has been active and independent with self care prior to admission. She states that husband will be the primary caregiver at home when needed.   Discharge plan is home according to patient.  Patient voiced understanding to call primary care provider's office for a post discharge follow-up appointment within a week or sooner if needs arise. Patient letter (with PCP's contact number) was provided as a reminder.  Patient mentioned that she has a scheduled appointment to see Dr. Tommie Ard  on 5/2 and was encouraged to keep this appointment for follow-up.  Patient denies further needs or concerns at this time.  For additional questions please contact:  Edwena Felty A. Dmoni Fortson, BSN, RN-BC [facility] PRIMARY CARE Navigator Cell: [phone]

## 2016-10-07 NOTE — Care Management Note (Signed)
Case Management Note Marvetta Gibbons RN, BSN Unit 2W-Case Manager [phone]  Patient Details  Name: MARIADELOSANG WYNNS MRN: [id number] Date of Birth: 1941/01/29  Subjective/Objective:  Pt admitted with afib for Tikosyn load                  Action/Plan: PTA pt lived at home- independent- plan to return home on discharge- referral received for Tikosyn needs- per insurance check copay cost for generic $15/mo- brand name not covered without approval. Spoke with pt at bedside coverage info shared- per pt she uses [facility] that can order drug in stock once script received- pt will need 7 day supply on day of discharge from [place]- MD to provide script to send down to [facility] to fill for 7 day- Pt will also need script sent to her [facility] for them to order drug in stock to fill.   Expected Discharge Date:   10/09/16               Expected Discharge Plan:  Home/Self Care  In-House Referral:     Discharge planning Services  CM Consult, Medication Assistance  Post Acute Care Choice:  NA Choice offered to:  NA  DME Arranged:    DME Agency:     HH Arranged:    HH Agency:     Status of Service:  Completed, signed off  If discussed at [employer] of Stay Meetings, dates discussed:    Additional Comments:  Dawayne Patricia, RN 10/07/2016, 11:34 AM

## 2016-10-07 NOTE — Progress Notes (Signed)
Per insurance check on Tikosyn # 5 [place]  @ [employer] # [id number]   1. TIKOSYN  125 MCG BID , 250 MCG BID AND 500 MCG BID      NOT ON FORMULARY  PRIOR APPROVAL- YES [phone] OPT- 3 , OPT-1    2 . DOFITILIDE  125 MCG BID  COVER- YES  CO-PAY- $ 15.00  TIER- 2 DRUG  PRIOR APPROVAL-NO   DOFITILIDE  250 MCG BID  COVER- YES  CO-PAY- $ 15.00  TIER- 2 DRUG  PRIOR APPROVAL-NO    PHARMACY : [facility]

## 2016-10-08 DIAGNOSIS — Z5181 Encounter for therapeutic drug level monitoring: Secondary | ICD-10-CM

## 2016-10-08 DIAGNOSIS — Z79899 Other long term (current) drug therapy: Secondary | ICD-10-CM

## 2016-10-08 LAB — MAGNESIUM: Magnesium: 2 mg/dL (ref 1.7–2.4)

## 2016-10-08 LAB — BASIC METABOLIC PANEL
Anion gap: 10 (ref 5–15)
BUN: 15 mg/dL (ref 6–20)
CO2: 26 mmol/L (ref 22–32)
Calcium: 9.5 mg/dL (ref 8.9–10.3)
Chloride: 102 mmol/L (ref 101–111)
Creatinine, Ser: 0.82 mg/dL (ref 0.44–1.00)
GFR calc Af Amer: >60 mL/min (ref >60)
GFR calc non Af Amer: >60 mL/min (ref >60)
Glucose, Bld: 85 mg/dL (ref 65–99)
Potassium: 3.8 mmol/L (ref 3.5–5.1)
Sodium: 138 mmol/L (ref 135–145)

## 2016-10-08 MED ORDER — POTASSIUM CHLORIDE CRYS ER 20 MEQ PO TBCR
20.0000 meq | EXTENDED_RELEASE_TABLET | ORAL | Status: AC
  Administered 2016-10-08 (×2): 20 meq via ORAL
  Filled 2016-10-08: qty 1

## 2016-10-08 NOTE — Progress Notes (Signed)
   SUBJECTIVE: The patient is doing well today.  At this time, she denies palpitations, chest pain, SOB. Remains in sinus rhythm.  Marland Kitchen apixaban  5 mg Oral BID  . dofetilide  250 mcg Oral BID  . pantoprazole  40 mg Oral Daily  . potassium chloride  20 mEq Oral Q2H  . propranolol  10 mg Oral BID  . sodium chloride flush  3 mL Intravenous Q12H   . sodium chloride      OBJECTIVE: Physical Exam: Vitals:   10/07/16 1804 10/07/16 2007 10/08/16 0440 10/08/16 0711  BP: 128/70 128/68 (!) 122/47 127/70  Pulse: 66 64 61 65  Resp:  18 16   Temp:  97.7 F (36.5 C) 97.6 F (36.4 C)   TempSrc:  Oral Oral   SpO2:  99% 95%   Weight:      Height:        Intake/Output Summary (Last 24 hours) at 10/08/16 0858 Last data filed at 10/07/16 1347  Gross per 24 hour  Intake              480 ml  Output                0 ml  Net              480 ml    Telemetry reveals sinus rhythm - personally reviewed  GEN: Well nourished, well developed, in no acute distress  HEENT: normal  Neck: no JVD, carotid bruits, or masses Cardiac: RRR; no murmurs, rubs, or gallops,no edema  Respiratory:  clear to auscultation bilaterally, normal work of breathing GI: soft, nontender, nondistended, + BS MS: no deformity or atrophy  Skin: warm and dry Neuro:  Strength and sensation are intact Psych: euthymic mood, full affect   LABS: Basic Metabolic Panel:  Recent Labs  10/07/16 0332 10/08/16 0303  NA 140 138  K 3.7 3.8  CL 107 102  CO2 25 26  GLUCOSE 81 85  BUN 19 15  CREATININE 0.77 0.82  CALCIUM 9.3 9.5  MG 2.0 2.0   ASSESSMENT AND PLAN:   1. afib Doing well with tikosyn QTc stable today Continue tikosyn at 500 mcg  2. HTN No change today  Anticipate discharge to home on Sunday   Jadis Pitter Meredith Leeds, MD 10/08/2016 8:58 AM

## 2016-10-09 LAB — BASIC METABOLIC PANEL
Anion gap: 9 (ref 5–15)
BUN: 18 mg/dL (ref 6–20)
CO2: 26 mmol/L (ref 22–32)
Calcium: 9.6 mg/dL (ref 8.9–10.3)
Chloride: 103 mmol/L (ref 101–111)
Creatinine, Ser: 0.82 mg/dL (ref 0.44–1.00)
GFR calc Af Amer: >60 mL/min (ref >60)
GFR calc non Af Amer: >60 mL/min (ref >60)
Glucose, Bld: 98 mg/dL (ref 65–99)
Potassium: 3.9 mmol/L (ref 3.5–5.1)
Sodium: 138 mmol/L (ref 135–145)

## 2016-10-09 LAB — MAGNESIUM: Magnesium: 2 mg/dL (ref 1.7–2.4)

## 2016-10-09 MED ORDER — PANTOPRAZOLE SODIUM 40 MG PO TBEC
40.0000 mg | DELAYED_RELEASE_TABLET | Freq: Every day | ORAL | Status: DC
  Administered 2016-10-09: 40 mg via ORAL
  Filled 2016-10-09: qty 1

## 2016-10-09 MED ORDER — DOFETILIDE 250 MCG PO CAPS: 250.0000 ug | ORAL_CAPSULE | Freq: Two times a day (BID) | ORAL | 6 refills | Status: DC

## 2016-10-09 NOTE — Progress Notes (Addendum)
   SUBJECTIVE: The patient is doing well today.  Remains in sinus rhythm. No palpitations, CP, SOB.  Marland Kitchen apixaban  5 mg Oral BID  . dofetilide  250 mcg Oral BID  . pantoprazole  40 mg Oral QAC breakfast  . propranolol  10 mg Oral BID  . sodium chloride flush  3 mL Intravenous Q12H   . sodium chloride      OBJECTIVE: Physical Exam: Vitals:   10/08/16 1315 10/08/16 1955 10/09/16 0409 10/09/16 0651  BP: (!) 121/53 112/65 127/62 138/71  Pulse: 65 61 (!) 56 73  Resp: 20 18 18    Temp: 97.5 F (36.4 C) 97.5 F (36.4 C) 97.5 F (36.4 C)   TempSrc: Oral Oral Oral   SpO2: 98% 100% 96%   Weight:      Height:        Intake/Output Summary (Last 24 hours) at 10/09/16 0810 Last data filed at 10/08/16 1800  Gross per 24 hour  Intake              480 ml  Output                0 ml  Net              480 ml    Telemetry reveals sinus rhythm - personally reviewed  GEN: Well nourished, well developed, in no acute distress  HEENT: normal  Neck: no JVD, carotid bruits, or masses Cardiac: RRR; no murmurs, rubs, or gallops,no edema  Respiratory:  clear to auscultation bilaterally, normal work of breathing GI: soft, nontender, nondistended, + BS MS: no deformity or atrophy  Skin: warm and dry Neuro:  Strength and sensation are intact Psych: euthymic mood, full affect  LABS: Basic Metabolic Panel:  Recent Labs  10/08/16 0303 10/09/16 0255  NA 138 138  K 3.8 3.9  CL 102 103  CO2 26 26  GLUCOSE 85 98  BUN 15 18  CREATININE 0.82 0.82  CALCIUM 9.5 9.6  MG 2.0 2.0   ASSESSMENT AND PLAN:   1. afib Currently on Tikosyn, stable QTc Last dose today Continue tikosyn at 250 mcg  2. HTN Well controlled  Likely discharge pending ECG today after 6th tikosyn dose   Shamus Desantis Meredith Leeds, MD 10/09/2016 8:10 AM

## 2016-10-09 NOTE — Discharge Summary (Signed)
Discharge Summary    Patient ID: Christy [name],  MRN: [id number], DOB/AGE: 07-26-1940 76 y.o.  Admit date: 10/06/2016 Discharge date: 10/09/2016  Primary Care Provider: UXNATF,TDDUKG Primary Cardiologist: Dr.Nelson EP: Dr. Christiana [facility]  Discharge Diagnoses    Active Problems:   Visit for monitoring Tikosyn therapy   PAF   PE/DVT   HTN   HLD  Allergies Allergies  Allergen Reactions  . Epinephrine Palpitations    Diagnostic Studies/Procedures    None   History of Present Illness     Christy [name] is a 76 y.o.female with hx of PAF, PE/DVT, HTN, HLD and GERD admitted for Tikosyn load.    With PMHx of very remote dx of PAT, more recently AFib noted at the time of DVT/PE Aug 2017, on full a/c, in follow up did well without AF though reported fatigue and dizziness, saw Dr. Meda Coffee 09/28/16 and noted sinus bradycardia at rate of 38bpm and her Propranolol was decreased, a few days later feeling terrible and suspected she was out of rhythm.  10/03/16 was seen by Gerrianne Scale, PA noted to have SB as  AFib with faster rates the case reviewed with Dr. Rayann Heman and decided to stop Flecainide with plans for Tikosyn.  The patient reported no missed doses of her Eliquis in the laast 4 weeks.  Seen in the AF clinic, Tikosyn teaching done, and med list reviewed with [place], and planned for admission.  AF Hx: DCCV 03/21/16 failed >> Flecainide >> SR  Flecainide stopped 10/03/16 (last dose was AM dose)   She is aware of her AF when she has it, makes her feel particularly fatigued, as well as increased sense of anxiety and her BP tends to be quite high when in AF as well.  Hospital Course     Consultants: None  Patient started on Tikosyn 250mg  BID. Remained  in sinus rhythm. No palpitations, CP, SOB. Stable Qtc. Supplement given for to keep K of 4. Tolerating well. The patient will get 7 days supply from [facility].   The patient has been seen by Dr. Curt Bears  today and deemed  ready for discharge home. All follow-up appointments have been scheduled. Discharge medications are listed below.  _____________   Discharge Vitals Blood pressure 138/71, pulse 73, temperature 97.5 F (36.4 C), temperature source Oral, resp. rate 18, height 5' 7.75" (1.721 m), weight 152 lb 8.9 oz (69.2 kg), SpO2 96 %.  Filed Weights   10/06/16 1126  Weight: 152 lb 8.9 oz (69.2 kg)    Labs & Radiologic Studies     CBC No results for input(s): WBC, NEUTROABS, HGB, HCT, MCV, PLT in the last 72 hours. Basic Metabolic Panel  Recent Labs  10/08/16 0303 10/09/16 0255  NA 138 138  K 3.8 3.9  CL 102 103  CO2 26 26  GLUCOSE 85 98  BUN 15 18  CREATININE 0.82 0.82  CALCIUM 9.5 9.6  MG 2.0 2.0   Liver Function Tests No results for input(s): AST, ALT, ALKPHOS, BILITOT, PROT, ALBUMIN in the last 72 hours. No results for input(s): LIPASE, AMYLASE in the last 72 hours. Cardiac Enzymes No results for input(s): CKTOTAL, CKMB, CKMBINDEX, TROPONINI in the last 72 hours. BNP Invalid input(s): POCBNP D-Dimer No results for input(s): DDIMER in the last 72 hours. Hemoglobin A1C No results for input(s): HGBA1C in the last 72 hours. Fasting Lipid Panel No results for input(s): CHOL, HDL, LDLCALC, TRIG, CHOLHDL, LDLDIRECT in the last 72 hours.  Thyroid Function Tests No results for input(s): TSH, T4TOTAL, T3FREE, THYROIDAB in the last 72 hours.  Invalid input(s): FREET3  No results found.  Disposition   Pt is being discharged home today in good condition.  Follow-up Plans & Appointments    Follow-up Information    CARROLL,DONNA, NP. Go on 10/17/2016.   Specialties:  Nurse Practitioner, Cardiology Why:  @9 :30am @ atrial fibrilaltion clinic for hosptial follow up.  Contact information: [place] [address] [phone]          Discharge Instructions    Diet - low sodium heart healthy    Complete by:  As directed    Increase activity slowly    Complete by:   As directed       Discharge Medications   Current Discharge Medication List    START taking these medications   Details  dofetilide (TIKOSYN) 250 MCG capsule Take 1 capsule (250 mcg total) by mouth 2 (two) times daily. Qty: 60 capsule, Refills: 6      CONTINUE these medications which have NOT CHANGED   Details  apixaban (ELIQUIS) 5 MG TABS tablet TAKE 1 TABLET BY MOUTH TWICE DAILY(START 02/03/2016) Qty: 180 tablet, Refills: 2    furosemide (LASIX) 20 MG tablet Take 1 tablet (20 mg total) by mouth daily as needed for fluid or edema (weight gain of 3 lbs in 24 hrs or 5 lbs in 1 week). Qty: 30 tablet, Refills: 3    pantoprazole (PROTONIX) 40 MG tablet Take 40 mg by mouth daily.    propranolol (INDERAL) 10 MG tablet Take 1 tablet (10 mg total) by mouth 2 (two) times daily. Qty: 180 tablet, Refills: 2   Associated Diagnoses: Paroxysmal atrial fibrillation ([place]); Bradycardia; Stenosis of carotid artery, unspecified laterality          Outstanding Labs/Studies   BEMT and Mg check during follow up.   Duration of Discharge Encounter   Greater than 30 minutes including physician time.  Signed, Bhagat,Bhavinkumar PA-C 10/09/2016, 10:31 AM   I have seen and examined this patient with Vin Bhagat.  Agree with above, note added to reflect my findings.  On exam, Regular rhythm, no murmurs, lungs clear. Admitted to the hospital for administration of dofetilide. QTC remains stable. In sinus rhythm this morning. Plan for discharge today with follow-up in A. fib clinic.    Will M. Camnitz MD 10/09/2016 12:03 PM

## 2016-10-17 ENCOUNTER — Encounter (HOSPITAL_COMMUNITY): Payer: PPO | Admitting: Nurse Practitioner

## 2016-10-17 ENCOUNTER — Ambulatory Visit (HOSPITAL_COMMUNITY)
Admission: RE | Admit: 2016-10-17 | Discharge: 2016-10-17 | Disposition: A | Discharge status: Home / Self Care | Payer: PPO | Source: Ambulatory Visit | Attending: Nurse Practitioner | Admitting: Nurse Practitioner

## 2016-10-17 ENCOUNTER — Encounter (HOSPITAL_COMMUNITY): Payer: Self-pay | Admitting: Nurse Practitioner

## 2016-10-17 VITALS — BP 134/84 | HR 65 | Ht 67.5 in | Wt 154.0 lb

## 2016-10-17 DIAGNOSIS — I480 Paroxysmal atrial fibrillation: Secondary | ICD-10-CM | POA: Diagnosis not present

## 2016-10-17 DIAGNOSIS — Z79899 Other long term (current) drug therapy: Secondary | ICD-10-CM | POA: Diagnosis not present

## 2016-10-17 DIAGNOSIS — I471 Supraventricular tachycardia: Secondary | ICD-10-CM | POA: Insufficient documentation

## 2016-10-17 DIAGNOSIS — M1990 Unspecified osteoarthritis, unspecified site: Secondary | ICD-10-CM | POA: Insufficient documentation

## 2016-10-17 DIAGNOSIS — Z7901 Long term (current) use of anticoagulants: Secondary | ICD-10-CM | POA: Insufficient documentation

## 2016-10-17 DIAGNOSIS — Z8249 Family history of ischemic heart disease and other diseases of the circulatory system: Secondary | ICD-10-CM | POA: Insufficient documentation

## 2016-10-17 DIAGNOSIS — Z86711 Personal history of pulmonary embolism: Secondary | ICD-10-CM | POA: Diagnosis not present

## 2016-10-17 DIAGNOSIS — E785 Hyperlipidemia, unspecified: Secondary | ICD-10-CM | POA: Diagnosis not present

## 2016-10-17 DIAGNOSIS — E559 Vitamin D deficiency, unspecified: Secondary | ICD-10-CM | POA: Insufficient documentation

## 2016-10-17 DIAGNOSIS — K219 Gastro-esophageal reflux disease without esophagitis: Secondary | ICD-10-CM | POA: Insufficient documentation

## 2016-10-17 DIAGNOSIS — Z823 Family history of stroke: Secondary | ICD-10-CM | POA: Diagnosis not present

## 2016-10-17 DIAGNOSIS — I10 Essential (primary) hypertension: Secondary | ICD-10-CM | POA: Diagnosis not present

## 2016-10-17 LAB — BASIC METABOLIC PANEL
Anion gap: 6 (ref 5–15)
BUN: 15 mg/dL (ref 6–20)
CO2: 27 mmol/L (ref 22–32)
Calcium: 9.8 mg/dL (ref 8.9–10.3)
Chloride: 105 mmol/L (ref 101–111)
Creatinine, Ser: 0.69 mg/dL (ref 0.44–1.00)
GFR calc Af Amer: >60 mL/min (ref >60)
GFR calc non Af Amer: >60 mL/min (ref >60)
Glucose, Bld: 82 mg/dL (ref 65–99)
Potassium: 3.9 mmol/L (ref 3.5–5.1)
Sodium: 138 mmol/L (ref 135–145)

## 2016-10-17 LAB — MAGNESIUM: Magnesium: 1.8 mg/dL (ref 1.7–2.4)

## 2016-10-17 NOTE — Progress Notes (Signed)
Primary Care Physician: Wenda Low, MD Referring Physician: Dr. Caprice Renshaw Christy [name] is a 76 y.o. female with a h/o atrial fib and PE/DVT 01/2016. She is on Eliquis and propanolol, lasix for lower extremity edema and diastolic dysfunction. She underwent cardioversion to normal sinus rhythm 03/21/16 but reverted back to A. fib. She was then started on flecainide and underwent stress test that was negative for ischemia, EF normal.  Last saw Dr. Meda Coffee 09/28/16 and was doing well without palpitations and in normal sinus rhythm with PACs. She had some fatigue that day. EKG showed severe sinus bradycardia with ventricular rate at 37 bpm so Dr. Meda Coffee tried to stop her propanolol but patient was reluctant because she had been taking it for 20 years. She therefore decreased it to 10 mg twice a day.  Patient later called in  saying that her blood pressures had been high 150/130 and she thought she  had A. fib the past 3 days and felt terrible. She says her blood pressures are going up because she is anxious. She is having some associated dizziness with it as well. She is usually very active but hasn't been able to do anything. EKG shows sinus bradycardia and then she goes into atrial fibrillation at a faster rate.  She is in the afib clinic today for Tikosyn initiation. Flecainide stopped several days ago. Tikosyn precautions discussed in detail with pt. They have not found out the cost of the drug, but do not seem to think cost to be a factor.Her meds have been reviewed by Fuller [place], PharmD and no qtc prolonging drugs on board. QTc is acceptable at 432 ms in SR this am. She is drinking 7-10 glasses a wine a week and we discussed how alcohol can contribute to afib as well increase bleeding risk. She is active and normal weight. She does not smoke or use caffeine. No missed doses of eliquis for at least 3 weeks.. Crcl cal at 52.57.  F/u tikosyn admission, 4/30. She reports no further afib. No problems  with obtaining  Dofetilide from pharmacy..  D/c on 250 mcg a day, qtc stable.Continues eliquis for a chadsvasc score of at least 3.   Today, she denies symptoms of palpitations, chest pain, shortness of breath, orthopnea, PND, lower extremity edema, dizziness, presyncope, syncope, or neurologic sequela. The patient is tolerating medications without difficulties and is otherwise without complaint today.   Past Medical History:  Diagnosis Date  . Atrial fibrillation ([place])   . Bilateral lower extremity edema    Chronic, likely related to venous stasis   . DJD (degenerative joint disease), lumbosacral   . Dyslipidemia   . Essential hypertension    "dx'd recently" (10/06/2016)  . GERD (gastroesophageal reflux disease)   . History of hepatitis C virus infection ~ 1997   In remission  . 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 Osteopenia   . Paroxysmal atrial tachycardia ([place])   . PE (pulmonary embolism) 01/2016  . Spondylolisthesis    Status post epidural injections 3 by neurosurgery  - Dr. Joya Salm   . Varicose veins of both legs with edema   . Vitamin D deficiency    Past Surgical History:  Procedure Laterality Date  . BACK SURGERY    . CARDIOVERSION N/A 03/21/2016   Procedure: CARDIOVERSION;  Surgeon: [name], MD;  Location: [facility];  Service: Cardiovascular;  Laterality: N/A;  . COMBINED HYSTEROSCOPY DIAGNOSTIC / D&C  2005  . COMBINED HYSTEROSCOPY  DIAGNOSTIC / D&C  1999  . DILATION AND CURETTAGE OF UTERUS    . [place]   "went in front and back; broken neck"  . TOE SURGERY Right    replaced joint  . TONSILLECTOMY    . TUBAL LIGATION Bilateral 1976  . VAGINAL DELIVERY     x2    Current Outpatient Prescriptions  Medication Sig Dispense Refill  . apixaban (ELIQUIS) 5 MG TABS tablet TAKE 1 TABLET BY MOUTH TWICE DAILY(START 02/03/2016) 180 tablet 2  . dofetilide (TIKOSYN) 250 MCG capsule Take 1 capsule (250 mcg total) by mouth 2 (two) times daily. 60  capsule 6  . furosemide (LASIX) 20 MG tablet Take 1 tablet (20 mg total) by mouth daily as needed for fluid or edema (weight gain of 3 lbs in 24 hrs or 5 lbs in 1 week). 30 tablet 3  . pantoprazole (PROTONIX) 40 MG tablet Take 40 mg by mouth daily.    . propranolol (INDERAL) 10 MG tablet Take 1 tablet (10 mg total) by mouth 2 (two) times daily. 180 tablet 2   No current facility-administered medications for this encounter.     Allergies  Allergen Reactions  . Epinephrine Palpitations    Social History   Social History  . Marital status: Married    Spouse name: N/A  . Number of children: N/A  . Years of education: N/A   Occupational History  . Not on file.   Social History Main Topics  . Smoking status: Never Smoker  . Smokeless tobacco: Never Used  . Alcohol use 4.2 oz/week    7 Glasses of wine per week     Comment: 10/06/2016 "wine daily"  . Drug use: No  . Sexual activity: Yes    Birth control/ protection: Other-see comments     Comment: tubal ligation   Other Topics Concern  . Not on file   Social History Narrative   She is a married mother of 1. Lives with her husband. 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 She has never smoked and drinks up to 10 glasses of wine or so week.   She usually exercises roughly 3 days a week doing yoga and plays golf. She is mostly limited by her "time constraints "    Family History  Problem Relation Age of Onset  . Heart Problems Mother   . CVA Mother   . Heart attack Mother 81  . CVA Father   . Colon cancer Neg Hx   . Clotting disorder Neg Hx     ROS- All systems are reviewed and negative except as per the HPI above  Physical Exam: Vitals:   10/17/16 1107  BP: 134/84  Pulse: 65  Weight: 154 lb (69.9 kg)  Height: 5' 7.5" (1.715 m)   Wt Readings from Last 3 Encounters:  10/17/16 154 lb (69.9 kg)  10/06/16 152 lb 8.9 oz (69.2 kg)  10/06/16 152 lb 3.2 oz (69 kg)    Labs: Lab Results  Component Value Date    NA 138 10/17/2016   K 3.9 10/17/2016   CL 105 10/17/2016   CO2 27 10/17/2016   GLUCOSE 82 10/17/2016   BUN 15 10/17/2016   CREATININE 0.69 10/17/2016   CALCIUM 9.8 10/17/2016   MG 1.8 10/17/2016   Lab Results  Component Value Date   INR 1.1 03/17/2016   No results found for: CHOL, HDL, LDLCALC, TRIG   GEN- The patient is well appearing, alert and  oriented x 3 today.   Head- normocephalic, atraumatic Eyes-  Sclera clear, conjunctiva pink Ears- hearing intact Oropharynx- clear Neck- supple, no JVP Lymph- no cervical lymphadenopathy Lungs- Clear to ausculation bilaterally, normal work of breathing Heart- Regular rate and rhythm, no murmurs, rubs or gallops, PMI not laterally displaced GI- soft, NT, ND, + BS Extremities- no clubbing, cyanosis, or edema MS- no significant deformity or atrophy Skin- no rash or lesion Psych- euthymic mood, full affect Neuro- strength and sensation are intact  EKG-NSR with first degree AV block, v rate of 65 bpm, pr int 228 ms, qrs int 88 ms, qtc 453 ms Echo-LV EF: 55% -   60%  ------------------------------------------------------------------- Indications:      Pulmonary Embolism (I26.99).  ------------------------------------------------------------------- History:   PMH:  Pulmonary Embolism, Atrial Tachycardia, Hepatitis C, Edema.  Atrial fibrillation.  Risk factors:  Family history of coronary artery disease. Hypertension. Dyslipidemia.  ------------------------------------------------------------------- Study Conclusions  - Left ventricle: The cavity size was normal. Wall thickness was   increased in a pattern of mild LVH. Systolic function was normal.   The estimated ejection fraction was in the range of 55% to 60%.   Wall motion was normal; there were no regional wall motion   abnormalities. The study is not technically sufficient to allow   evaluation of LV diastolic function. - Mitral valve: Calcified annulus. There was mild  regurgitation. - Left atrium: The atrium was severely dilated. - Right ventricle: The cavity size was mildly dilated. - Right atrium: The atrium was severely dilated. - Pulmonary arteries: Systolic pressure was mildly increased.  Impressions:  - Normal LV systolic function; severe biatrial enlargement; L atrium 32mm, mild   RVE; mild MR.     Assessment and Plan: 1. Paroxysmal atrial fibrillation Maintaining SR on dofetilide 250 mcg bid Drug precautions discussed Continue eliquis with chadsvasc score of 3 Alcohol reduction discussed to know more than 2 glasses a week bmet/mag today     Butch Penny C. Kariyah Baugh, [facility] [address] [place], [place] [address] [phone]

## 2016-10-19 ENCOUNTER — Other Ambulatory Visit (HOSPITAL_COMMUNITY): Payer: Self-pay | Admitting: *Deleted

## 2016-10-19 DIAGNOSIS — K219 Gastro-esophageal reflux disease without esophagitis: Secondary | ICD-10-CM | POA: Diagnosis not present

## 2016-10-19 DIAGNOSIS — J309 Allergic rhinitis, unspecified: Secondary | ICD-10-CM | POA: Diagnosis not present

## 2016-10-19 DIAGNOSIS — I4891 Unspecified atrial fibrillation: Secondary | ICD-10-CM | POA: Diagnosis not present

## 2016-10-19 DIAGNOSIS — Z1389 Encounter for screening for other disorder: Secondary | ICD-10-CM | POA: Diagnosis not present

## 2016-10-19 DIAGNOSIS — N182 Chronic kidney disease, stage 2 (mild): Secondary | ICD-10-CM | POA: Diagnosis not present

## 2016-10-19 DIAGNOSIS — I2699 Other pulmonary embolism without acute cor pulmonale: Secondary | ICD-10-CM | POA: Diagnosis not present

## 2016-10-19 DIAGNOSIS — I5030 Unspecified diastolic (congestive) heart failure: Secondary | ICD-10-CM | POA: Diagnosis not present

## 2016-10-19 DIAGNOSIS — I82402 Acute embolism and thrombosis of unspecified deep veins of left lower extremity: Secondary | ICD-10-CM | POA: Diagnosis not present

## 2016-10-19 MED ORDER — MAGNESIUM 200 MG PO TABS: 200.0000 mg | ORAL_TABLET | Freq: Every day | ORAL | Status: DC

## 2016-10-24 ENCOUNTER — Encounter: Payer: Self-pay | Admitting: *Deleted

## 2016-10-24 DIAGNOSIS — M1712 Unilateral primary osteoarthritis, left knee: Secondary | ICD-10-CM | POA: Diagnosis not present

## 2016-11-02 ENCOUNTER — Ambulatory Visit (INDEPENDENT_AMBULATORY_CARE_PROVIDER_SITE_OTHER): Payer: PPO | Admitting: Cardiology

## 2016-11-02 ENCOUNTER — Encounter: Payer: Self-pay | Admitting: Cardiology

## 2016-11-02 VITALS — BP 120/66 | HR 60 | Ht 67.5 in | Wt 155.0 lb

## 2016-11-02 DIAGNOSIS — I272 Pulmonary hypertension, unspecified: Secondary | ICD-10-CM

## 2016-11-02 DIAGNOSIS — I5032 Chronic diastolic (congestive) heart failure: Secondary | ICD-10-CM

## 2016-11-02 DIAGNOSIS — I10 Essential (primary) hypertension: Secondary | ICD-10-CM | POA: Diagnosis not present

## 2016-11-02 DIAGNOSIS — I480 Paroxysmal atrial fibrillation: Secondary | ICD-10-CM | POA: Diagnosis not present

## 2016-11-02 NOTE — Progress Notes (Signed)
Cardiology Office Note    Date:  11/02/2016   ID:  Christy, [name] 29-Jul-1940, MRN [id number]  PCP:  Christy Low, MD  Cardiologist:   Christy Dawley, MD   No chief complaint on file.   History of Present Illness:  Christy [name] is a 76 y.o. female with history of PE/DVT with right-sided heart failure and severe pulmonary hypertension and paroxysmal atrial fibrillation in August 2017.  She was recently hospitalized for Tikosyn loading. She states that compared to flecainide she has significant improvement in energy level. She has started to play golf again with 18 holes at a time. She denies any chest pain shortness of breath she is occasional second lasting skipped beats but no longer palpitations. She denies lower extremity edema orthopnea or paroxysmal nocturnal dyspnea. No dizziness or presyncope or syncope.    Past Medical History:  Diagnosis Date  . Atrial fibrillation ([place])   . Bilateral lower extremity edema    Chronic, likely related to venous stasis   . DJD (degenerative joint disease), lumbosacral   . Dyslipidemia   . Essential hypertension    "dx'd recently" (10/06/2016)  . GERD (gastroesophageal reflux disease)   . History of hepatitis C virus infection ~ 1997   In remission  . Lung granuloma ([place]) 2004    CT scan  . Osteopenia   . Paroxysmal atrial tachycardia ([place])   . PE (pulmonary embolism) 01/2016  . Spondylolisthesis    Status post epidural injections 3 by neurosurgery  - Dr. Joya [name]   . Varicose veins of both legs with edema   . Vitamin D deficiency     Past Surgical History:  Procedure Laterality Date  . BACK SURGERY    . CARDIOVERSION N/A 03/21/2016   Procedure: CARDIOVERSION;  Surgeon: Christy Spark, MD;  Location: [facility];  Service: Cardiovascular;  Laterality: N/A;  . COMBINED HYSTEROSCOPY DIAGNOSTIC / D&C  2005  . COMBINED HYSTEROSCOPY DIAGNOSTIC / D&C  1999  . DILATION AND CURETTAGE OF UTERUS    . [place]   "went in front and back; broken neck"  . TOE SURGERY Right    replaced joint  . TONSILLECTOMY    . TUBAL LIGATION Bilateral 1976  . VAGINAL DELIVERY     x2    Current Medications: Outpatient Medications Prior to Visit  Medication Sig Dispense Refill  . apixaban (ELIQUIS) 5 MG TABS tablet TAKE 1 TABLET BY MOUTH TWICE DAILY(START 02/03/2016) 180 tablet 2  . dofetilide (TIKOSYN) 250 MCG capsule Take 1 capsule (250 mcg total) by mouth 2 (two) times daily. 60 capsule 6  . furosemide (LASIX) 20 MG tablet Take 1 tablet (20 mg total) by mouth daily as needed for fluid or edema (weight gain of 3 lbs in 24 hrs or 5 lbs in 1 week). 30 tablet 3  . Magnesium 200 MG TABS Take 1 tablet (200 mg total) by mouth daily. 30 each   . pantoprazole (PROTONIX) 40 MG tablet Take 40 mg by mouth daily.    . propranolol (INDERAL) 10 MG tablet Take 1 tablet (10 mg total) by mouth 2 (two) times daily. 180 tablet 2   No facility-administered medications prior to visit.      Allergies:   Epinephrine   Social History   Social History  . Marital status: Married    Spouse name: N/A  . Number of children: N/A  . Years of education: N/A   Social History Main Topics  . Smoking  status: Never Smoker  . Smokeless tobacco: Never Used  . Alcohol use 4.2 oz/week    7 Glasses of wine per week     Comment: 10/06/2016 "wine daily"  . Drug use: No  . Sexual activity: Yes    Birth control/ protection: Other-see comments     Comment: tubal ligation   Other Topics Concern  . None   Social History Narrative   She is a married mother of 1. Lives with her husband. 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 She has never smoked and drinks up to 10 glasses of wine or so week.   She usually exercises roughly 3 days a week doing yoga and plays golf. She is mostly limited by her "time constraints "     Family History:  The patient's family history includes CVA in her father and mother; Heart Problems in her mother; Heart  attack (age of onset: 40) in her mother.   ROS:   Please see the history of present illness.    ROS All other systems reviewed and are negative.   PHYSICAL EXAM:   VS:  BP 120/66   Pulse 60   Ht 5' 7.5" (1.715 m)   Wt 155 lb (70.3 kg)   SpO2 97%   BMI 23.92 kg/m    GEN: Well nourished, well developed, in no acute distress  HEENT: normal  Neck: no JVD, carotid bruits, or masses Cardiac: RRR; 3/6 systolic murmurs, rubs, or gallops,no edema  Respiratory:  clear to auscultation bilaterally, normal work of breathing GI: soft, nontender, nondistended, + BS MS: no deformity or atrophy  Skin: warm and dry, no rash Neuro:  Alert and Oriented x 3, Strength and sensation are intact Psych: euthymic mood, full affect  Wt Readings from Last 3 Encounters:  11/02/16 155 lb (70.3 kg)  10/17/16 154 lb (69.9 kg)  10/06/16 152 lb 8.9 oz (69.2 kg)      Studies/Labs Reviewed:   EKG:  EKG is ordered today.  The ekg ordered today demonstrates Sinus bradycardia with first-degree AV block otherwise normal EKG, QT/QTc 442/426 ms. Personally reviewed.  Recent Labs: 01/25/2016: TSH 2.609 02/05/2016: ALT 16; Brain Natriuretic Peptide 254.4 03/17/2016: Hemoglobin 11.8 10/03/2016: Platelets WILL FOLLOW 10/17/2016: BUN 15; Creatinine, Ser 0.69; Magnesium 1.8; Potassium 3.9; Sodium 138   Lipid Panel No results found for: CHOL, TRIG, HDL, CHOLHDL, VLDL, LDLCALC, LDLDIRECT  Additional studies/ records that were reviewed today include:  - Left ventricle: The cavity size was normal. Wall thickness was   increased in a pattern of mild LVH. Systolic function was normal.   The estimated ejection fraction was in the range of 55% to 60%.   Wall motion was normal; there were no regional wall motion   abnormalities. The study is not technically sufficient to allow   evaluation of LV diastolic function. - Mitral valve: Calcified annulus. There was mild regurgitation. - Left atrium: The atrium was severely  dilated. - Right ventricle: The cavity size was mildly dilated. - Right atrium: The atrium was severely dilated. - Pulmonary arteries: Systolic pressure was mildly increased.  Impressions:  - Normal LV systolic function; severe biatrial enlargement; mild   RVE; mild MR.    ASSESSMENT:    1. Essential hypertension   2. Paroxysmal atrial fibrillation (HCC)   3. Pulmonary hypertension ([place])   4. Chronic diastolic CHF (congestive heart failure) (HCC)      PLAN:  In order of problems listed above:  1. The patient was  loaded with Tikosyn, and she feels best in a long time, will continue to gather with propranolol 10 mg by mouth twice a day, EKG check today shows QT/QTc 442/426 ms we'll recheck electrolytes including magnesium today. 2. Blood pressure is well controlled 3. Pulmonary hypertension is asymptomatic, results of pulmonary embolism. 4. She appears euvolemic.    Medication Adjustments/Labs and Tests Ordered: Current medicines are reviewed at length with the patient today.  Concerns regarding medicines are outlined above.  Medication changes, Labs and Tests ordered today are listed in the Patient Instructions below. Patient Instructions  Medication Instructions:   Your physician recommends that you continue on your current medications as directed. Please refer to the Current Medication list given to you today.    Labwork:  TODAY--BMET, MAGNESIUM,  AND PRO-BNP       Follow-Up:  3 MONTHS WITH DR Meda Coffee       If you need a refill on your cardiac medications before your next appointment, please call your pharmacy.      Signed, Christy Dawley, MD  11/02/2016 11:33 AM    [address], [place], [place]  [address] Phone: [phone]; Fax: [phone]

## 2016-11-02 NOTE — Patient Instructions (Signed)
Medication Instructions:   Your physician recommends that you continue on your current medications as directed. Please refer to the Current Medication list given to you today.    Labwork:  TODAY--BMET, MAGNESIUM,  AND PRO-BNP       Follow-Up:  3 MONTHS WITH DR Meda Coffee       If you need a refill on your cardiac medications before your next appointment, please call your pharmacy.

## 2016-11-03 LAB — BASIC METABOLIC PANEL
BUN/Creatinine Ratio: 29 — ABNORMAL HIGH (ref 12–28)
BUN: 24 mg/dL (ref 8–27)
CO2: 27 mmol/L (ref 18–29)
Calcium: 9.6 mg/dL (ref 8.7–10.3)
Chloride: 98 mmol/L (ref 96–106)
Creatinine, Ser: 0.82 mg/dL (ref 0.57–1.00)
GFR calc Af Amer: 79 mL/min/{1.73_m2} (ref >59)
GFR calc non Af Amer: 69 mL/min/{1.73_m2} (ref >59)
Glucose: 81 mg/dL (ref 65–99)
Potassium: 4.4 mmol/L (ref 3.5–5.2)
Sodium: 139 mmol/L (ref 134–144)

## 2016-11-03 LAB — PRO B NATRIURETIC PEPTIDE: NT-Pro BNP: 368 pg/mL (ref 0–738)

## 2016-11-03 LAB — MAGNESIUM: Magnesium: 2.1 mg/dL (ref 1.6–2.3)

## 2016-11-09 ENCOUNTER — Ambulatory Visit: Payer: PPO | Admitting: Internal Medicine

## 2016-11-09 ENCOUNTER — Ambulatory Visit (INDEPENDENT_AMBULATORY_CARE_PROVIDER_SITE_OTHER): Payer: PPO | Admitting: Internal Medicine

## 2016-11-09 VITALS — BP 126/82 | HR 66 | Ht 67.75 in | Wt 156.4 lb

## 2016-11-09 DIAGNOSIS — I480 Paroxysmal atrial fibrillation: Secondary | ICD-10-CM

## 2016-11-09 NOTE — Patient Instructions (Signed)
Medication Instructions:  Your physician recommends that you continue on your current medications as directed. Please refer to the Current Medication list given to you today.  Labwork: None ordered.  Testing/Procedures: None ordered.  Follow-Up: Your physician recommends that you schedule a follow-up appointment as needed.   Any Other Special Instructions Will Be Listed Below (If Applicable).     If you need a refill on your cardiac medications before your next appointment, please call your pharmacy.   

## 2016-11-09 NOTE — Progress Notes (Signed)
PCP: Wenda Low, MD Primary Cardiologist:  Dr Fidel Levy is a 76 y.o. female who presents today for routine electrophysiology followup.  Since being discharged post tikosyn load, the patient reports doing very well.  She feels "great" with sinus rhythm.  No further episodes.  No symptoms with tikosyn. Today, she denies symptoms of palpitations, chest pain, shortness of breath,  lower extremity edema, dizziness, presyncope, or syncope.  The patient is otherwise without complaint today.   Past Medical History:  Diagnosis Date  . Atrial fibrillation ([place])   . Bilateral lower extremity edema    Chronic, likely related to venous stasis   . DJD (degenerative joint disease), lumbosacral   . Dyslipidemia   . Essential hypertension    "dx'd recently" (10/06/2016)  . GERD (gastroesophageal reflux disease)   . History of hepatitis C virus infection ~ 1997   In remission  . Lung granuloma ([place]) 2004    CT scan  . Osteopenia   . Paroxysmal atrial tachycardia ([place])   . PE (pulmonary embolism) 01/2016  . Spondylolisthesis    Status post epidural injections 3 by neurosurgery  - Dr. Joya Salm   . Varicose veins of both legs with edema   . Vitamin D deficiency    Past Surgical History:  Procedure Laterality Date  . BACK SURGERY    . CARDIOVERSION N/A 03/21/2016   Procedure: CARDIOVERSION;  Surgeon: [name], MD;  Location: [facility];  Service: Cardiovascular;  Laterality: N/A;  . COMBINED HYSTEROSCOPY DIAGNOSTIC / D&C  2005  . COMBINED HYSTEROSCOPY DIAGNOSTIC / D&C  1999  . DILATION AND CURETTAGE OF UTERUS    . [place]   "went in front and back; broken neck"  . TOE SURGERY Right    replaced joint  . TONSILLECTOMY    . TUBAL LIGATION Bilateral 1976  . VAGINAL DELIVERY     x2    ROS- all systems are reviewed and negatives except as per HPI above  Current Outpatient Prescriptions  Medication Sig Dispense Refill  . apixaban (ELIQUIS) 5 MG TABS tablet TAKE  1 TABLET BY MOUTH TWICE DAILY(START 02/03/2016) 180 tablet 2  . dofetilide (TIKOSYN) 250 MCG capsule Take 1 capsule (250 mcg total) by mouth 2 (two) times daily. 60 capsule 6  . furosemide (LASIX) 20 MG tablet Take 1 tablet (20 mg total) by mouth daily as needed for fluid or edema (weight gain of 3 lbs in 24 hrs or 5 lbs in 1 week). 30 tablet 3  . Magnesium 200 MG TABS Take 1 tablet (200 mg total) by mouth daily. 30 each   . pantoprazole (PROTONIX) 40 MG tablet Take 40 mg by mouth daily.    . propranolol (INDERAL) 10 MG tablet Take 1 tablet (10 mg total) by mouth 2 (two) times daily. 180 tablet 2   No current facility-administered medications for this visit.     Physical Exam: Vitals:   11/09/16 1557  BP: 126/82  Pulse: 66  SpO2: 98%  Weight: 156 lb 6.4 oz (70.9 kg)  Height: 5' 7.75" (1.721 m)    GEN- The patient is well appearing, alert and oriented x 3 today.   Head- normocephalic, atraumatic Eyes-  Sclera clear, conjunctiva pink Ears- hearing intact Oropharynx- clear Lungs- Clear to ausculation bilaterally, normal work of breathing Heart- Regular rate and rhythm, no murmurs, rubs or gallops, PMI not laterally displaced GI- soft, NT, ND, + BS Extremities- no clubbing, cyanosis, or edema  ekg today  reveals sinus rhythm 66 bpm, PR 228 msec, otherwise normal ekg  Assessment and Plan:  1. Persistent afib Maintaining sinus with tikosyn and pleased with results Stable qt Labs reviewed She will continue to try to avoid heavy ETOH though this seems to be a challenge.  Consider ablation if afib returns  Follow-up with Dr Meda Coffee as scheduled She will need bmet, mg every 6 months I will see when needed going forward  [name] MD, [facility] 11/09/2016 4:31 PM

## 2017-01-13 DIAGNOSIS — H5203 Hypermetropia, bilateral: Secondary | ICD-10-CM | POA: Diagnosis not present

## 2017-01-13 DIAGNOSIS — H25813 Combined forms of age-related cataract, bilateral: Secondary | ICD-10-CM | POA: Diagnosis not present

## 2017-01-13 DIAGNOSIS — H52223 Regular astigmatism, bilateral: Secondary | ICD-10-CM | POA: Diagnosis not present

## 2017-01-18 ENCOUNTER — Telehealth: Payer: Self-pay | Admitting: Cardiology

## 2017-01-18 DIAGNOSIS — I4891 Unspecified atrial fibrillation: Secondary | ICD-10-CM

## 2017-01-18 DIAGNOSIS — I471 Supraventricular tachycardia: Secondary | ICD-10-CM

## 2017-01-18 DIAGNOSIS — R42 Dizziness and giddiness: Secondary | ICD-10-CM

## 2017-01-18 DIAGNOSIS — R001 Bradycardia, unspecified: Secondary | ICD-10-CM

## 2017-01-18 NOTE — Telephone Encounter (Signed)
Christy [name] is calling because the Tioskyn seem to be working ok , but in the afternoons ( from [employer]) she has been having some sensations that she is wanting to pass out . Please  Call

## 2017-01-19 DIAGNOSIS — R42 Dizziness and giddiness: Secondary | ICD-10-CM | POA: Insufficient documentation

## 2017-01-19 NOTE — Telephone Encounter (Signed)
Please order 48 hour Holter monitor.

## 2017-01-19 NOTE — Telephone Encounter (Signed)
Pt scheduled to have 48 hour holter monitor placed on 8/7 at 0930.  Pt made aware of appt date and time by [facility] scheduling.

## 2017-01-19 NOTE — Telephone Encounter (Signed)
Pt calling to inform Dr Meda Coffee and Dr Rayann Heman that she has been doing pretty well on Tikosyn, and her HR has been very stable and within normal limits.   Pt states her main issue is that some days, in the afternoon only, she has a presyncopal episode that last for about 2 secs.  Pt states this frightens her when this occurs.  Pt states when she experiences these episodes she does not have any other cardiac related issues.  Pt states this doesn't happen everyday.  Pt states that the trend is it occurs mostly in the afternoons or after lunch time.  Pt states she is asymptomatic at this time.  Informed the pt that I will route this information to both Dr Meda Coffee and Dr Rayann Heman to review and advise on. Informed the pt that I will follow-up with her shortly thereafter, once recommendations are provided.  Pt verbalized understanding and agrees with this plan.

## 2017-01-19 NOTE — Telephone Encounter (Signed)
Informed the pt that per Dr Meda Coffee, she recommends that we put a 48 hour holter monitor on her for further evaluation of complaints. Informed the pt that I will place the order in the system and have a [facility] scheduler call her back to arrange this appt. Pt verbalized understanding and agrees with this plan.

## 2017-01-24 ENCOUNTER — Ambulatory Visit (INDEPENDENT_AMBULATORY_CARE_PROVIDER_SITE_OTHER): Payer: PPO

## 2017-01-24 DIAGNOSIS — R42 Dizziness and giddiness: Secondary | ICD-10-CM | POA: Diagnosis not present

## 2017-01-24 DIAGNOSIS — I471 Supraventricular tachycardia: Secondary | ICD-10-CM

## 2017-01-24 DIAGNOSIS — R001 Bradycardia, unspecified: Secondary | ICD-10-CM | POA: Diagnosis not present

## 2017-01-24 DIAGNOSIS — I4891 Unspecified atrial fibrillation: Secondary | ICD-10-CM

## 2017-02-01 ENCOUNTER — Encounter: Payer: Self-pay | Admitting: Cardiology

## 2017-02-09 ENCOUNTER — Telehealth: Payer: Self-pay | Admitting: *Deleted

## 2017-02-09 MED ORDER — PROPRANOLOL HCL 20 MG PO TABS: 20.0000 mg | ORAL_TABLET | Freq: Two times a day (BID) | ORAL | 1 refills | Status: DC

## 2017-02-09 NOTE — Telephone Encounter (Signed)
-----   Message from [name], MD sent at 02/08/2017  8:42 AM EDT ----- Please increase her propranolol to 20 mg po BID, also ask if she wants to be referred for an a-fib ablation.

## 2017-02-09 NOTE — Telephone Encounter (Signed)
Christy Spark, MD  Nuala Alpha, LPN        Please increase her propranolol to 20 mg po BID, also ask if she wants to be referred for an a-fib ablation.     Holter monitor - 48 hour  Order: [id number]  Status:  Final result Visible to patient:  Yes (MyChart) Dx:  Bradycardia; PAT (paroxysmal atrial t...  Notes recorded by Dionicio Stall, RN on 02/08/2017 at 4:09 PM EDT [facility]- Can you call the patient and let her know please.  Received: Yesterday  Message Contents  [name], MD Christy Spark, MD Cc: Dionicio Stall, RN    Would increase her inderal for the short term. May be time to consider ablation.   I can have Claiborne Billings call her to see if she wants to come and and discuss this further with me.    ------  Notes recorded by Christy Spark, MD on 02/08/2017 at 8:42 AM EDT Please increase her propranolol to 20 mg po BID, also ask if she wants to be referred for an a-fib ablation.  Details   Reading Physician Reading Date Result Priority  Christy Spark, MD 01/31/2017 Routine  Narrative & Impression      Sinus bradycardia to sinus tachycardia.  Frequent PACs (2000 in 24 hours) and PVCs (3800 in 48 hours).  Very frequent runs of SVT, total of 165 runs, with max rate 165 BPM and the longest run lasting 31 beats.  No significant pauses.   Very frequent runs of SVT (re-entrant vs atrial), total of 165 runs, with max rate 165 BPM and the longest run lasting 31 beats.

## 2017-02-09 NOTE — Telephone Encounter (Signed)
Notified the pt of her 48 hr holter monitor results and recommendations per Dr Meda Coffee and Dr Rayann Heman. Pt is interested in both increasing her propranolol and seeing Dr Rayann Heman to discuss for possible consideration of afib ablation.  Endorsed to the pt that we will increase her propranolol to 20 mg po bid and I will send a message to Dr Jackalyn Lombard scheduler, to follow-up with the pt to further assist in making an appt.  Confirmed the pharmacy of choice with the pt.  Pt verbalized understanding and agrees with this plan.

## 2017-02-23 ENCOUNTER — Ambulatory Visit: Payer: PPO | Admitting: Cardiology

## 2017-02-27 ENCOUNTER — Ambulatory Visit (INDEPENDENT_AMBULATORY_CARE_PROVIDER_SITE_OTHER): Payer: PPO | Admitting: Internal Medicine

## 2017-02-27 ENCOUNTER — Encounter: Payer: Self-pay | Admitting: Internal Medicine

## 2017-02-27 VITALS — BP 136/82 | HR 59 | Ht 67.5 in | Wt 155.0 lb

## 2017-02-27 DIAGNOSIS — I10 Essential (primary) hypertension: Secondary | ICD-10-CM

## 2017-02-27 DIAGNOSIS — I471 Supraventricular tachycardia: Secondary | ICD-10-CM

## 2017-02-27 DIAGNOSIS — I480 Paroxysmal atrial fibrillation: Secondary | ICD-10-CM

## 2017-02-27 LAB — BASIC METABOLIC PANEL
BUN/Creatinine Ratio: 32 — ABNORMAL HIGH (ref 12–28)
BUN: 26 mg/dL (ref 8–27)
CO2: 26 mmol/L (ref 20–29)
Calcium: 9.6 mg/dL (ref 8.7–10.3)
Chloride: 101 mmol/L (ref 96–106)
Creatinine, Ser: 0.81 mg/dL (ref 0.57–1.00)
GFR calc Af Amer: 80 mL/min/{1.73_m2} (ref >59)
GFR calc non Af Amer: 70 mL/min/{1.73_m2} (ref >59)
Glucose: 92 mg/dL (ref 65–99)
Potassium: 4.4 mmol/L (ref 3.5–5.2)
Sodium: 140 mmol/L (ref 134–144)

## 2017-02-27 LAB — CBC WITH DIFFERENTIAL/PLATELET
Basophils Absolute: 0 10*3/uL (ref 0.0–0.2)
Basos: 0 %
EOS (ABSOLUTE): 0.2 10*3/uL (ref 0.0–0.4)
Eos: 4 %
Hematocrit: 36.5 % (ref 34.0–46.6)
Hemoglobin: 11.9 g/dL (ref 11.1–15.9)
Immature Grans (Abs): 0 10*3/uL (ref 0.0–0.1)
Immature Granulocytes: 0 %
Lymphocytes Absolute: 1.3 10*3/uL (ref 0.7–3.1)
Lymphs: 27 %
MCH: 30.4 pg (ref 26.6–33.0)
MCHC: 32.6 g/dL (ref 31.5–35.7)
MCV: 93 fL (ref 79–97)
Monocytes Absolute: 0.7 10*3/uL (ref 0.1–0.9)
Monocytes: 15 %
Neutrophils Absolute: 2.7 10*3/uL (ref 1.4–7.0)
Neutrophils: 54 %
Platelets: 232 10*3/uL (ref 150–379)
RBC: 3.91 x10E6/uL (ref 3.77–5.28)
RDW: 13 % (ref 12.3–15.4)
WBC: 4.9 10*3/uL (ref 3.4–10.8)

## 2017-02-27 NOTE — Progress Notes (Signed)
Electrophysiology Office Note   Date:  02/27/2017   ID:  [name], [name] 01/08/1941, MRN [id number]  PCP:  Wenda Low, MD  Cardiologist:  Dr Meda Coffee Primary Electrophysiologist: [name], MD    Chief Complaint  Patient presents with  . Atrial Fibrillation     History of Present Illness: Christy [name] is a 76 y.o. female who presents today for electrophysiology evaluation.   She continues to have intermittent palpitations.  She recently wore an event monitor which captured frequent PACs and nonsustained atach.  These episodes appear to be primarily long RP and likely atach, though there is a more regular short RP arrhythmia at times.  She has reduce alcohol to only 1 drink per day.  Today, she denies symptoms of chest pain, shortness of breath, orthopnea, PND, lower extremity edema, claudication, dizziness, presyncope, syncope, bleeding, or neurologic sequela. The patient is tolerating medications without difficulties and is otherwise without complaint today.    Past Medical History:  Diagnosis Date  . Atrial fibrillation ([place])   . Bilateral lower extremity edema    Chronic, likely related to venous stasis   . DJD (degenerative joint disease), lumbosacral   . Dyslipidemia   . Essential hypertension    "dx'd recently" (10/06/2016)  . GERD (gastroesophageal reflux disease)   . History of hepatitis C virus infection ~ 1997   In remission  . Lung granuloma ([place]) 2004    CT scan  . Osteopenia   . Paroxysmal atrial tachycardia ([place])   . PE (pulmonary embolism) 01/2016  . Spondylolisthesis    Status post epidural injections 3 by neurosurgery  - Dr. Joya Salm   . Varicose veins of both legs with edema   . Vitamin D deficiency    Past Surgical History:  Procedure Laterality Date  . BACK SURGERY    . CARDIOVERSION N/A 03/21/2016   Procedure: CARDIOVERSION;  Surgeon: [name], MD;  Location: [place];  Service: Cardiovascular;  Laterality: N/A;  . COMBINED  HYSTEROSCOPY DIAGNOSTIC / D&C  2005  . COMBINED HYSTEROSCOPY DIAGNOSTIC / D&C  1999  . DILATION AND CURETTAGE OF UTERUS    . [place]   "went in front and back; broken neck"  . TOE SURGERY Right    replaced joint  . TONSILLECTOMY    . TUBAL LIGATION Bilateral 1976  . VAGINAL DELIVERY     x2     Current Outpatient Prescriptions  Medication Sig Dispense Refill  . apixaban (ELIQUIS) 5 MG TABS tablet TAKE 1 TABLET BY MOUTH TWICE DAILY(START 02/03/2016) 180 tablet 2  . dofetilide (TIKOSYN) 250 MCG capsule Take 1 capsule (250 mcg total) by mouth 2 (two) times daily. 60 capsule 6  . furosemide (LASIX) 20 MG tablet Take 1 tablet (20 mg total) by mouth daily as needed for fluid or edema (weight gain of 3 lbs in 24 hrs or 5 lbs in 1 week). 30 tablet 3  . loratadine (CLARITIN) 10 MG tablet Take 10 mg by mouth daily.    . Magnesium 200 MG TABS Take 1 tablet (200 mg total) by mouth daily. 30 each   . pantoprazole (PROTONIX) 40 MG tablet Take 40 mg by mouth daily.    . propranolol (INDERAL) 20 MG tablet Take 1 tablet (20 mg total) by mouth 2 (two) times daily. 180 tablet 1   No current facility-administered medications for this visit.     Allergies:   Epinephrine   Social History:  The patient  reports that she has never smoked. She has never used smokeless tobacco. She reports that she drinks about 4.2 oz of alcohol per week . She reports that she does not use drugs.   Family History:  The patient's  family history includes CVA in her father and mother; Heart Problems in her mother; Heart attack (age of onset: 50) in her mother.    ROS:  Please see the history of present illness.   All other systems are personally reviewed and negative.    PHYSICAL EXAM: VS:  BP 136/82   Pulse (!) 59   Ht 5' 7.5" (1.715 m)   Wt 155 lb (70.3 kg)   SpO2 98%   BMI 23.92 kg/m  , BMI Body mass index is 23.92 kg/m. GEN: Well nourished, well developed, in no acute distress  HEENT: normal  Neck:  no JVD, carotid bruits, or masses Cardiac: RRR; no murmurs, rubs, or gallops,trace edema  Respiratory:  clear to auscultation bilaterally, normal work of breathing GI: soft, nontender, nondistended, + BS MS: no deformity or atrophy  Skin: warm and dry  Neuro:  Strength and sensation are intact Psych: euthymic mood, full affect  EKG:  EKG is ordered today. The ekg ordered today is personally reviewed and shows sinus rhythm 59 bpm, PR 244 msec   Recent Labs: 10/03/2016: Hemoglobin WILL FOLLOW; Platelets WILL FOLLOW 11/02/2016: BUN 24; Creatinine, Ser 0.82; Magnesium 2.1; NT-Pro BNP 368; Potassium 4.4; Sodium 139  personally reviewed   Lipid Panel  No results found for: CHOL, TRIG, HDL, CHOLHDL, VLDL, LDLCALC, LDLDIRECT personally reviewed   Wt Readings from Last 3 Encounters:  02/27/17 155 lb (70.3 kg)  11/09/16 156 lb 6.4 oz (70.9 kg)  11/02/16 155 lb (70.3 kg)      Other studies personally reviewed: Additional studies/ records that were reviewed today include: prior notes, recent event monitor (personally reviewed in detail), prior echo  Review of the above records today demonstrates: as above   ASSESSMENT AND PLAN:  1.  Persistent afib The patient has symptomatic atrial arrhythmias including afib, atrach tachycardia, and possibly a reentrant arrhythmia.  She has severe LA enlargement.  She has failed medical therapy with tikosyn. Therapeutic strategies for afib and SVT including medicine and ablation were discussed in detail with the patient today. Risk, benefits, and alternatives to EP study and radiofrequency ablation were also discussed in detail today. These risks include but are not limited to stroke, bleeding, vascular damage, tamponade, perforation, damage to the esophagus, lungs, and other structures, pulmonary vein stenosis, worsening renal function, and death. The patient understands these risk and wishes to proceed.  We will therefore proceed with catheter ablation at  the next available time.  TEE is anticipated prior to ablation to exclude LAA thrombus.  2. HTN Stable No change required today    Current medicines are reviewed at length with the patient today.   The patient does not have concerns regarding her medicines.  The following changes were made today:  none    Signed, [name], MD  02/27/2017 8:57 AM     [facility] [address] [place] [address] [phone] (office) [phone] (fax)

## 2017-02-27 NOTE — Patient Instructions (Addendum)
Medication Instructions:  Your physician recommends that you continue on your current medications as directed. Please refer to the Current Medication list given to you today.   Labwork: Your physician recommends that you return for lab work today: BMP/CBC   Testing/Procedures: Your physician has requested that you have a TEE. During a TEE, sound waves are used to create images of your heart. It provides your doctor with information about the size and shape of your heart and how well your heart's chambers and valves are working. In this test, a transducer is attached to the end of a flexible tube that's guided down your throat and into your esophagus (the tube leading from you mouth to your stomach) to get a more detailed image of your heart. You are not awake for the procedure. Please see the instruction sheet given to you today. For further information please visit [email or web address]  Please arrive at [facility] [place] [facility] at 8:30am Do not eat or drink after midnight the night prior to the procedure Okay to take medications the morning of the test with a small sip of water Will need someone to drive you home at discharge   Your physician has recommended that you have an ablation. Catheter ablation is a medical procedure used to treat some cardiac arrhythmias (irregular heartbeats). During catheter ablation, a long, thin, flexible tube is put into a blood vessel in your groin (upper thigh), or neck. This tube is called an ablation catheter. It is then guided to your heart through the blood vessel. Radio frequency waves destroy small areas of heart tissue where abnormal heartbeats may cause an arrhythmia to start. Please see the instruction sheet given to you today.---03/09/17  Please arrive at [facility] [place] [facility] at 5:30am Do not eat or drink after midnight the night prior to the procedure Do not take any medications the  morning of the test Plan for one night stay Will need someone to drive you home at discharge   Follow-Up: Your physician recommends that you schedule a follow-up appointment in: 4 weeks from 03/09/17 with Roderic Palau, NP and 3 months from 03/09/17 with Dr Rayann Heman   Thank you for choosing [facility]!!     Janan Halter, RN [phone]    Cardiac Ablation Cardiac ablation is a procedure to disable (ablate) a small amount of heart tissue in very specific places. The heart has many electrical connections. Sometimes these connections are abnormal and can cause the heart to beat very fast or irregularly. Ablating some of the problem areas can improve the heart rhythm or return it to normal. Ablation may be done for people who:  Have Wolff-Parkinson-White syndrome.  Have fast heart rhythms (tachycardia).  Have taken medicines for an abnormal heart rhythm (arrhythmia) that were not effective or caused side effects.  Have a high-risk heartbeat that may be life-threatening.  During the procedure, a small incision is made in the neck or the groin, and a long, thin, flexible tube (catheter) is inserted into the incision and moved to the heart. Small devices (electrodes) on the tip of the catheter will send out electrical currents. A type of X-ray (fluoroscopy) will be used to help guide the catheter and to provide images of the heart. Tell a health care provider about:  Any allergies you have.  All medicines you are taking, including vitamins, herbs, eye drops, creams, and over-the-counter medicines.  Any problems you or family members have had  with anesthetic medicines.  Any blood disorders you have.  Any surgeries you have had.  Any medical conditions you have, such as kidney failure.  Whether you are pregnant or may be pregnant. What are the risks? Generally, this is a safe procedure. However, problems may occur, including:  Infection.  Bruising and bleeding at the  catheter insertion site.  Bleeding into the chest, especially into the sac that surrounds the heart. This is a serious complication.  Stroke or blood clots.  Damage to other structures or organs.  Allergic reaction to medicines or dyes.  Need for a permanent pacemaker if the normal electrical system is damaged. A pacemaker is a small computer that sends electrical signals to the heart and helps your heart beat normally.  The procedure not being fully effective. This may not be recognized until months later. Repeat ablation procedures are sometimes required.  What happens before the procedure?  Follow instructions from your health care provider about eating or drinking restrictions.  Ask your health care provider about: ? Changing or stopping your regular medicines. This is especially important if you are taking diabetes medicines or blood thinners. ? Taking medicines such as aspirin and ibuprofen. These medicines can thin your blood. Do not take these medicines before your procedure if your health care provider instructs you not to.  Plan to have someone take you home from the hospital or clinic.  If you will be going home right after the procedure, plan to have someone with you for 24 hours. What happens during the procedure?  To lower your risk of infection: ? Your health care team will wash or sanitize their hands. ? Your skin will be washed with soap. ? Hair may be removed from the incision area.  An IV tube will be inserted into one of your veins.  You will be given a medicine to help you relax (sedative).  The skin on your neck or groin will be numbed.  An incision will be made in your neck or your groin.  A needle will be inserted through the incision and into a large vein in your neck or groin.  A catheter will be inserted into the needle and moved to your heart.  Dye may be injected through the catheter to help your surgeon see the area of the heart that needs  treatment.  Electrical currents will be sent from the catheter to ablate heart tissue in desired areas. There are three types of energy that may be used to ablate heart tissue: ? Heat (radiofrequency energy). ? Laser energy. ? Extreme cold (cryoablation).  When the necessary tissue has been ablated, the catheter will be removed.  Pressure will be held on the catheter insertion area to prevent excessive bleeding.  A bandage (dressing) will be placed over the catheter insertion area. The procedure may vary among health care providers and hospitals. What happens after the procedure?  Your blood pressure, heart rate, breathing rate, and blood oxygen level will be monitored until the medicines you were given have worn off.  Your catheter insertion area will be monitored for bleeding. You will need to lie still for a few hours to ensure that you do not bleed from the catheter insertion area.  Do not drive for 24 hours or as long as directed by your health care provider. Summary  Cardiac ablation is a procedure to disable (ablate) a small amount of heart tissue in very specific places. Ablating some of the problem areas can improve the  heart rhythm or return it to normal.  During the procedure, electrical currents will be sent from the catheter to ablate heart tissue in desired areas. This information is not intended to replace advice given to you by your health care provider. Make sure you discuss any questions you have with your health care provider. Document Released: 10/23/2008 Document Revised: 04/25/2016 Document Reviewed: 04/25/2016 Elsevier Interactive Patient Education  [employer].

## 2017-03-08 ENCOUNTER — Encounter (HOSPITAL_COMMUNITY)
Admission: RE | Disposition: A | Discharge status: Home / Self Care | Payer: Self-pay | Source: Ambulatory Visit | Attending: Cardiovascular Disease

## 2017-03-08 ENCOUNTER — Ambulatory Visit (HOSPITAL_BASED_OUTPATIENT_CLINIC_OR_DEPARTMENT_OTHER): Payer: PPO

## 2017-03-08 ENCOUNTER — Encounter (HOSPITAL_COMMUNITY): Payer: Self-pay | Admitting: *Deleted

## 2017-03-08 ENCOUNTER — Ambulatory Visit (HOSPITAL_COMMUNITY)
Admission: RE | Admit: 2017-03-08 | Discharge: 2017-03-08 | Disposition: A | Discharge status: Home / Self Care | Payer: PPO | Source: Ambulatory Visit | Attending: Cardiovascular Disease | Admitting: Cardiovascular Disease

## 2017-03-08 DIAGNOSIS — E785 Hyperlipidemia, unspecified: Secondary | ICD-10-CM | POA: Insufficient documentation

## 2017-03-08 DIAGNOSIS — I340 Nonrheumatic mitral (valve) insufficiency: Secondary | ICD-10-CM

## 2017-03-08 DIAGNOSIS — J8410 Pulmonary fibrosis, unspecified: Secondary | ICD-10-CM | POA: Diagnosis not present

## 2017-03-08 DIAGNOSIS — M47817 Spondylosis without myelopathy or radiculopathy, lumbosacral region: Secondary | ICD-10-CM | POA: Insufficient documentation

## 2017-03-08 DIAGNOSIS — M8580 Other specified disorders of bone density and structure, unspecified site: Secondary | ICD-10-CM | POA: Insufficient documentation

## 2017-03-08 DIAGNOSIS — E559 Vitamin D deficiency, unspecified: Secondary | ICD-10-CM | POA: Insufficient documentation

## 2017-03-08 DIAGNOSIS — I481 Persistent atrial fibrillation: Secondary | ICD-10-CM | POA: Insufficient documentation

## 2017-03-08 DIAGNOSIS — I471 Supraventricular tachycardia: Secondary | ICD-10-CM | POA: Insufficient documentation

## 2017-03-08 DIAGNOSIS — I10 Essential (primary) hypertension: Secondary | ICD-10-CM | POA: Diagnosis not present

## 2017-03-08 DIAGNOSIS — Z8249 Family history of ischemic heart disease and other diseases of the circulatory system: Secondary | ICD-10-CM | POA: Diagnosis not present

## 2017-03-08 DIAGNOSIS — K219 Gastro-esophageal reflux disease without esophagitis: Secondary | ICD-10-CM | POA: Insufficient documentation

## 2017-03-08 DIAGNOSIS — Z7901 Long term (current) use of anticoagulants: Secondary | ICD-10-CM | POA: Diagnosis not present

## 2017-03-08 DIAGNOSIS — Z86711 Personal history of pulmonary embolism: Secondary | ICD-10-CM | POA: Insufficient documentation

## 2017-03-08 DIAGNOSIS — I4819 Other persistent atrial fibrillation: Secondary | ICD-10-CM

## 2017-03-08 DIAGNOSIS — B1920 Unspecified viral hepatitis C without hepatic coma: Secondary | ICD-10-CM | POA: Diagnosis not present

## 2017-03-08 HISTORY — PX: TEE WITHOUT CARDIOVERSION: SHX5443

## 2017-03-08 SURGERY — ECHOCARDIOGRAM, TRANSESOPHAGEAL
Anesthesia: Moderate Sedation

## 2017-03-08 MED ORDER — FENTANYL CITRATE (PF) 100 MCG/2ML IJ SOLN
INTRAMUSCULAR | Status: AC
  Filled 2017-03-08: qty 2

## 2017-03-08 MED ORDER — MIDAZOLAM HCL 5 MG/ML IJ SOLN
INTRAMUSCULAR | Status: AC
  Filled 2017-03-08: qty 2

## 2017-03-08 MED ORDER — SODIUM CHLORIDE 0.9 % IV SOLN: INTRAVENOUS | Status: DC

## 2017-03-08 MED ORDER — MIDAZOLAM HCL 10 MG/2ML IJ SOLN
INTRAMUSCULAR | Status: DC | PRN
  Administered 2017-03-08 (×2): 2 mg via INTRAVENOUS
  Administered 2017-03-08: 1 mg via INTRAVENOUS

## 2017-03-08 MED ORDER — BUTAMBEN-TETRACAINE-BENZOCAINE 2-2-14 % EX AERO
INHALATION_SPRAY | CUTANEOUS | Status: DC | PRN
  Administered 2017-03-08: 2 via TOPICAL

## 2017-03-08 MED ORDER — FENTANYL CITRATE (PF) 100 MCG/2ML IJ SOLN
INTRAMUSCULAR | Status: DC | PRN
  Administered 2017-03-08 (×2): 25 ug via INTRAVENOUS

## 2017-03-08 NOTE — CV Procedure (Signed)
During this procedure the patient is administered a total of Versed 5 mg and Fentanyl 50 mg to achieve and maintain moderate conscious sedation.  The patient's heart rate, blood pressure, and oxygen saturation are monitored continuously during the procedure. The period of conscious sedation is 35 minutes, of which I was present face-to-face 100% of this time.  Severe LAE No ASD negative bubble No LAA thrombus Posterior leaflet prolapse with moderate MR Normal AV Normal RV Normal aortic root No effusion  Ok to proceed with ablation in am  [employer]

## 2017-03-08 NOTE — Anesthesia Preprocedure Evaluation (Addendum)
Anesthesia Evaluation  Patient identified by MRN, date of birth, ID band Patient awake    Reviewed: Allergy & Precautions, NPO status , Patient's Chart, lab work & pertinent test results, reviewed documented beta blocker date and time   Airway Mallampati: II  TM Distance: >3 FB Neck ROM: Full    Dental   Pulmonary neg pulmonary ROS,    breath sounds clear to auscultation       Cardiovascular hypertension, Pt. on home beta blockers and Pt. on medications + Peripheral Vascular Disease  + dysrhythmias Atrial Fibrillation  Rhythm:Irregular Rate:Normal     Neuro/Psych negative neurological ROS     GI/Hepatic GERD  ,(+) Hepatitis -  Endo/Other  negative endocrine ROS  Renal/GU negative Renal ROS     Musculoskeletal  (+) Arthritis ,   Abdominal   Peds  Hematology negative hematology ROS (+)   Anesthesia Other Findings   Reproductive/Obstetrics                            Lab Results  Component Value Date   WBC 4.9 02/27/2017   HGB 11.9 02/27/2017   HCT 36.5 02/27/2017   MCV 93 02/27/2017   PLT 232 02/27/2017   Lab Results  Component Value Date   CREATININE 0.81 02/27/2017   BUN 26 02/27/2017   NA 140 02/27/2017   K 4.4 02/27/2017   CL 101 02/27/2017   CO2 26 02/27/2017   2017 Nuclear stress test: Normal resting and stress perfusion. No ischemia or infarction EF 57%   9/18 TEE: NL EF. Moderate MR. Anesthesia Physical Anesthesia Plan  ASA: III  Anesthesia Plan: MAC   Post-op Pain Management:    Induction: Intravenous  PONV Risk Score and Plan: 2 and Ondansetron, Propofol infusion and Treatment may vary due to age or medical condition  Airway Management Planned: Natural Airway and Simple Face Mask  Additional Equipment:   Intra-op Plan:   Post-operative Plan:   Informed Consent: I have reviewed the patients History and Physical, chart, labs and discussed the procedure  including the risks, benefits and alternatives for the proposed anesthesia with the patient or authorized representative who has indicated his/her understanding and acceptance.     Plan Discussed with: CRNA  Anesthesia Plan Comments:        Anesthesia Quick Evaluation

## 2017-03-08 NOTE — Interval H&P Note (Signed)
History and Physical Interval Note:  03/08/2017 8:55 AM  Christy [name]  has presented today for surgery, with the diagnosis of afib  The various methods of treatment have been discussed with the patient and family. After consideration of risks, benefits and other options for treatment, the patient has consented to  Procedure(s): TRANSESOPHAGEAL ECHOCARDIOGRAM (TEE) (N/A) as a surgical intervention .  The patient's history has been reviewed, patient examined, no change in status, stable for surgery.  I have reviewed the patient's chart and labs.  Questions were answered to the patient's satisfaction.     Jenkins Rouge

## 2017-03-08 NOTE — Progress Notes (Signed)
  Echocardiogram Echocardiogram Transesophageal has been performed.  Tresa Res 03/08/2017, 10:17 AM

## 2017-03-08 NOTE — H&P (View-Only) (Signed)
Electrophysiology Office Note   Date:  02/27/2017   ID:  Christy, [name] 1940-11-13, MRN [id number]  PCP:  Wenda Low, MD  Cardiologist:  Dr Meda Coffee Primary Electrophysiologist: [name], MD    Chief Complaint  Patient presents with  . Atrial Fibrillation     History of Present Illness: Christy [name] is a 76 y.o. female who presents today for electrophysiology evaluation.   She continues to have intermittent palpitations.  She recently wore an event monitor which captured frequent PACs and nonsustained atach.  These episodes appear to be primarily long RP and likely atach, though there is a more regular short RP arrhythmia at times.  She has reduce alcohol to only 1 drink per day.  Today, she denies symptoms of chest pain, shortness of breath, orthopnea, PND, lower extremity edema, claudication, dizziness, presyncope, syncope, bleeding, or neurologic sequela. The patient is tolerating medications without difficulties and is otherwise without complaint today.    Past Medical History:  Diagnosis Date  . Atrial fibrillation ([place])   . Bilateral lower extremity edema    Chronic, likely related to venous stasis   . DJD (degenerative joint disease), lumbosacral   . Dyslipidemia   . Essential hypertension    "dx'd recently" (10/06/2016)  . GERD (gastroesophageal reflux disease)   . History of hepatitis C virus infection ~ 1997   In remission  . 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 Osteopenia   . Paroxysmal atrial tachycardia ([place])   . PE (pulmonary embolism) 01/2016  . Spondylolisthesis    Status post epidural injections 3 by neurosurgery  - Dr. Joya Salm   . Varicose veins of both legs with edema   . Vitamin D deficiency    Past Surgical History:  Procedure Laterality Date  . BACK SURGERY    . CARDIOVERSION N/A 03/21/2016   Procedure: CARDIOVERSION;  Surgeon: [name], MD;  Location: [facility];  Service: Cardiovascular;  Laterality: N/A;  . COMBINED  HYSTEROSCOPY DIAGNOSTIC / D&C  2005  . COMBINED HYSTEROSCOPY DIAGNOSTIC / D&C  1999  . DILATION AND CURETTAGE OF UTERUS    . [place]   "went in front and back; broken neck"  . TOE SURGERY Right    replaced joint  . TONSILLECTOMY    . TUBAL LIGATION Bilateral 1976  . VAGINAL DELIVERY     x2     Current Outpatient Prescriptions  Medication Sig Dispense Refill  . apixaban (ELIQUIS) 5 MG TABS tablet TAKE 1 TABLET BY MOUTH TWICE DAILY(START 02/03/2016) 180 tablet 2  . dofetilide (TIKOSYN) 250 MCG capsule Take 1 capsule (250 mcg total) by mouth 2 (two) times daily. 60 capsule 6  . furosemide (LASIX) 20 MG tablet Take 1 tablet (20 mg total) by mouth daily as needed for fluid or edema (weight gain of 3 lbs in 24 hrs or 5 lbs in 1 week). 30 tablet 3  . loratadine (CLARITIN) 10 MG tablet Take 10 mg by mouth daily.    . Magnesium 200 MG TABS Take 1 tablet (200 mg total) by mouth daily. 30 each   . pantoprazole (PROTONIX) 40 MG tablet Take 40 mg by mouth daily.    . propranolol (INDERAL) 20 MG tablet Take 1 tablet (20 mg total) by mouth 2 (two) times daily. 180 tablet 1   No current facility-administered medications for this visit.     Allergies:   Epinephrine   Social History:  The patient  reports that she has never smoked. She has never used smokeless tobacco. She reports that she drinks about 4.2 oz of alcohol per week . She reports that she does not use drugs.   Family History:  The patient's  family history includes CVA in her father and mother; Heart Problems in her mother; Heart attack (age of onset: 45) in her mother.    ROS:  Please see the history of present illness.   All other systems are personally reviewed and negative.    PHYSICAL EXAM: VS:  BP 136/82   Pulse (!) 59   Ht 5' 7.5" (1.715 m)   Wt 155 lb (70.3 kg)   SpO2 98%   BMI 23.92 kg/m  , BMI Body mass index is 23.92 kg/m. GEN: Well nourished, well developed, in no acute distress  HEENT: normal  Neck:  no JVD, carotid bruits, or masses Cardiac: RRR; no murmurs, rubs, or gallops,trace edema  Respiratory:  clear to auscultation bilaterally, normal work of breathing GI: soft, nontender, nondistended, + BS MS: no deformity or atrophy  Skin: warm and dry  Neuro:  Strength and sensation are intact Psych: euthymic mood, full affect  EKG:  EKG is ordered today. The ekg ordered today is personally reviewed and shows sinus rhythm 59 bpm, PR 244 msec   Recent Labs: 10/03/2016: Hemoglobin WILL FOLLOW; Platelets WILL FOLLOW 11/02/2016: BUN 24; Creatinine, Ser 0.82; Magnesium 2.1; NT-Pro BNP 368; Potassium 4.4; Sodium 139  personally reviewed   Lipid Panel  No results found for: CHOL, TRIG, HDL, CHOLHDL, VLDL, LDLCALC, LDLDIRECT personally reviewed   Wt Readings from Last 3 Encounters:  02/27/17 155 lb (70.3 kg)  11/09/16 156 lb 6.4 oz (70.9 kg)  11/02/16 155 lb (70.3 kg)      Other studies personally reviewed: Additional studies/ records that were reviewed today include: prior notes, recent event monitor (personally reviewed in detail), prior echo  Review of the above records today demonstrates: as above   ASSESSMENT AND PLAN:  1.  Persistent afib The patient has symptomatic atrial arrhythmias including afib, atrach tachycardia, and possibly a reentrant arrhythmia.  She has severe LA enlargement.  She has failed medical therapy with tikosyn. Therapeutic strategies for afib and SVT including medicine and ablation were discussed in detail with the patient today. Risk, benefits, and alternatives to EP study and radiofrequency ablation were also discussed in detail today. These risks include but are not limited to stroke, bleeding, vascular damage, tamponade, perforation, damage to the esophagus, lungs, and other structures, pulmonary vein stenosis, worsening renal function, and death. The patient understands these risk and wishes to proceed.  We will therefore proceed with catheter ablation at  the next available time.  TEE is anticipated prior to ablation to exclude LAA thrombus.  2. HTN Stable No change required today    Current medicines are reviewed at length with the patient today.   The patient does not have concerns regarding her medicines.  The following changes were made today:  none    Signed, [name], MD  02/27/2017 8:57 AM     [facility] [address] [place] [address] [phone] (office) [phone] (fax)

## 2017-03-08 NOTE — Discharge Instructions (Signed)

## 2017-03-09 ENCOUNTER — Encounter (HOSPITAL_COMMUNITY): Payer: Self-pay | Admitting: Cardiovascular Disease

## 2017-03-09 ENCOUNTER — Encounter (HOSPITAL_COMMUNITY)
Admission: RE | Disposition: A | Discharge status: Home / Self Care | Payer: Self-pay | Source: Ambulatory Visit | Attending: Internal Medicine

## 2017-03-09 ENCOUNTER — Ambulatory Visit (HOSPITAL_COMMUNITY): Payer: PPO | Admitting: Anesthesiology

## 2017-03-09 ENCOUNTER — Ambulatory Visit (HOSPITAL_COMMUNITY)
Admission: RE | Admit: 2017-03-09 | Discharge: 2017-03-10 | Disposition: A | Discharge status: Home / Self Care | Payer: PPO | Source: Ambulatory Visit | Attending: Internal Medicine | Admitting: Internal Medicine

## 2017-03-09 DIAGNOSIS — I10 Essential (primary) hypertension: Secondary | ICD-10-CM | POA: Diagnosis not present

## 2017-03-09 DIAGNOSIS — I484 Atypical atrial flutter: Secondary | ICD-10-CM | POA: Diagnosis not present

## 2017-03-09 DIAGNOSIS — I4891 Unspecified atrial fibrillation: Secondary | ICD-10-CM | POA: Diagnosis present

## 2017-03-09 DIAGNOSIS — I480 Paroxysmal atrial fibrillation: Secondary | ICD-10-CM | POA: Diagnosis not present

## 2017-03-09 DIAGNOSIS — I2609 Other pulmonary embolism with acute cor pulmonale: Secondary | ICD-10-CM | POA: Diagnosis not present

## 2017-03-09 DIAGNOSIS — I471 Supraventricular tachycardia: Secondary | ICD-10-CM | POA: Diagnosis not present

## 2017-03-09 DIAGNOSIS — I481 Persistent atrial fibrillation: Secondary | ICD-10-CM | POA: Diagnosis not present

## 2017-03-09 HISTORY — PX: ATRIAL FIBRILLATION ABLATION: EP1191

## 2017-03-09 LAB — POCT ACTIVATED CLOTTING TIME
Activated Clotting Time: 246 seconds
Activated Clotting Time: 252 seconds
Activated Clotting Time: 197 seconds
Activated Clotting Time: 224 seconds
Activated Clotting Time: 191 seconds
Activated Clotting Time: 175 seconds

## 2017-03-09 SURGERY — ATRIAL FIBRILLATION ABLATION
Anesthesia: Monitor Anesthesia Care

## 2017-03-09 MED ORDER — HEPARIN SODIUM (PORCINE) 1000 UNIT/ML IJ SOLN
INTRAMUSCULAR | Status: AC
  Filled 2017-03-09: qty 1

## 2017-03-09 MED ORDER — PROPOFOL 500 MG/50ML IV EMUL
INTRAVENOUS | Status: DC | PRN
  Administered 2017-03-09: 09:00:00 via INTRAVENOUS
  Administered 2017-03-09: 50 ug/kg/min via INTRAVENOUS

## 2017-03-09 MED ORDER — HEPARIN SODIUM (PORCINE) 1000 UNIT/ML IJ SOLN
INTRAMUSCULAR | Status: DC | PRN
  Administered 2017-03-09: 1000 [IU] via INTRAVENOUS

## 2017-03-09 MED ORDER — APIXABAN 5 MG PO TABS
5.0000 mg | ORAL_TABLET | Freq: Two times a day (BID) | ORAL | Status: DC
  Administered 2017-03-09 – 2017-03-10 (×2): 5 mg via ORAL
  Filled 2017-03-09 (×2): qty 1

## 2017-03-09 MED ORDER — HYDROCODONE-ACETAMINOPHEN 5-325 MG PO TABS: 1.0000 | ORAL_TABLET | ORAL | Status: DC | PRN

## 2017-03-09 MED ORDER — ISOPROTERENOL HCL 0.2 MG/ML IJ SOLN
INTRAVENOUS | Status: DC | PRN
  Administered 2017-03-09: 10 ug/min via INTRAVENOUS

## 2017-03-09 MED ORDER — DOFETILIDE 250 MCG PO CAPS
250.0000 ug | ORAL_CAPSULE | Freq: Two times a day (BID) | ORAL | Status: DC
  Administered 2017-03-09 – 2017-03-10 (×2): 250 ug via ORAL
  Filled 2017-03-09: qty 1

## 2017-03-09 MED ORDER — IOPAMIDOL (ISOVUE-370) INJECTION 76%
INTRAVENOUS | Status: DC | PRN
  Administered 2017-03-09: 3 mL via INTRAVENOUS

## 2017-03-09 MED ORDER — PHENYLEPHRINE HCL 10 MG/ML IJ SOLN
INTRAVENOUS | Status: DC | PRN
  Administered 2017-03-09: 30 ug/min via INTRAVENOUS

## 2017-03-09 MED ORDER — ACETAMINOPHEN 325 MG PO TABS: 650.0000 mg | ORAL_TABLET | ORAL | Status: DC | PRN

## 2017-03-09 MED ORDER — PROTAMINE SULFATE 10 MG/ML IV SOLN
INTRAVENOUS | Status: DC | PRN
  Administered 2017-03-09: 30 mg via INTRAVENOUS

## 2017-03-09 MED ORDER — LIDOCAINE HCL (PF) 1 % IJ SOLN
INTRAMUSCULAR | Status: DC | PRN
  Administered 2017-03-09: 25 mL via INTRADERMAL

## 2017-03-09 MED ORDER — SODIUM CHLORIDE 0.9 % IV SOLN
INTRAVENOUS | Status: DC
  Administered 2017-03-09: 06:00:00 via INTRAVENOUS

## 2017-03-09 MED ORDER — LIDOCAINE HCL (PF) 1 % IJ SOLN
INTRAMUSCULAR | Status: AC
  Filled 2017-03-09: qty 30

## 2017-03-09 MED ORDER — MIDAZOLAM HCL 2 MG/2ML IJ SOLN
INTRAMUSCULAR | Status: DC | PRN
  Administered 2017-03-09: 1 mg via INTRAVENOUS

## 2017-03-09 MED ORDER — MAGNESIUM 200 MG PO TABS
200.0000 mg | ORAL_TABLET | Freq: Every day | ORAL | Status: DC
  Administered 2017-03-10: 200 mg via ORAL
  Filled 2017-03-09 (×2): qty 1

## 2017-03-09 MED ORDER — ONDANSETRON HCL 4 MG/2ML IJ SOLN
4.0000 mg | Freq: Four times a day (QID) | INTRAMUSCULAR | Status: DC | PRN
  Filled 2017-03-09: qty 2

## 2017-03-09 MED ORDER — ONDANSETRON HCL 4 MG/2ML IJ SOLN
INTRAMUSCULAR | Status: DC | PRN
  Administered 2017-03-09: 4 mg via INTRAVENOUS

## 2017-03-09 MED ORDER — HEPARIN SODIUM (PORCINE) 1000 UNIT/ML IJ SOLN
INTRAMUSCULAR | Status: DC | PRN
  Administered 2017-03-09: 5000 [IU] via INTRAVENOUS
  Administered 2017-03-09: 4000 [IU] via INTRAVENOUS
  Administered 2017-03-09: 12000 [IU] via INTRAVENOUS

## 2017-03-09 MED ORDER — PROMETHAZINE HCL 25 MG/ML IJ SOLN: 6.2500 mg | INTRAMUSCULAR | Status: DC | PRN

## 2017-03-09 MED ORDER — FENTANYL CITRATE (PF) 100 MCG/2ML IJ SOLN: 25.0000 ug | INTRAMUSCULAR | Status: DC | PRN

## 2017-03-09 MED ORDER — PROPRANOLOL HCL 20 MG PO TABS
20.0000 mg | ORAL_TABLET | Freq: Two times a day (BID) | ORAL | Status: DC
  Administered 2017-03-09 – 2017-03-10 (×2): 20 mg via ORAL
  Filled 2017-03-09 (×3): qty 1

## 2017-03-09 MED ORDER — SODIUM CHLORIDE 0.9% FLUSH: 3.0000 mL | INTRAVENOUS | Status: DC | PRN

## 2017-03-09 MED ORDER — PROPOFOL 10 MG/ML IV BOLUS
INTRAVENOUS | Status: DC | PRN
  Administered 2017-03-09: 20 mg via INTRAVENOUS
  Administered 2017-03-09 (×5): 10 mg via INTRAVENOUS
  Administered 2017-03-09 (×3): 20 mg via INTRAVENOUS
  Administered 2017-03-09 (×2): 10 mg via INTRAVENOUS

## 2017-03-09 MED ORDER — PANTOPRAZOLE SODIUM 40 MG PO TBEC
40.0000 mg | DELAYED_RELEASE_TABLET | Freq: Every day | ORAL | Status: DC
  Administered 2017-03-10: 40 mg via ORAL
  Filled 2017-03-09 (×2): qty 1

## 2017-03-09 MED ORDER — ISOPROTERENOL HCL 0.2 MG/ML IJ SOLN
INTRAMUSCULAR | Status: AC
  Filled 2017-03-09: qty 5

## 2017-03-09 MED ORDER — LIDOCAINE HCL (PF) 2 % IJ SOLN
INTRAMUSCULAR | Status: DC | PRN
  Administered 2017-03-09: 40 mg via INTRADERMAL

## 2017-03-09 MED ORDER — SODIUM CHLORIDE 0.9% FLUSH: 3.0000 mL | Freq: Two times a day (BID) | INTRAVENOUS | Status: DC

## 2017-03-09 MED ORDER — IOPAMIDOL (ISOVUE-370) INJECTION 76%
INTRAVENOUS | Status: AC
  Filled 2017-03-09: qty 50

## 2017-03-09 MED ORDER — DOFETILIDE 250 MCG PO CAPS
250.0000 ug | ORAL_CAPSULE | Freq: Two times a day (BID) | ORAL | Status: DC
  Filled 2017-03-09: qty 1

## 2017-03-09 MED ORDER — SODIUM CHLORIDE 0.9 % IV SOLN: 250.0000 mL | INTRAVENOUS | Status: DC | PRN

## 2017-03-09 SURGICAL SUPPLY — 18 items
BAG SNAP BAND KOVER 36X36 (MISCELLANEOUS) ×2 IMPLANT
BLANKET WARM UNDERBOD FULL ACC (MISCELLANEOUS) ×2 IMPLANT
CATH NAVISTAR SMARTTOUCH DF (ABLATOR) ×1 IMPLANT
CATH SOUNDSTAR ECO REPROCESSED (CATHETERS) ×1 IMPLANT
CATH VARIABLE LASSO NAV 2515 (CATHETERS) ×1 IMPLANT
CATH WEBSTER BI DIR CS D-F CRV (CATHETERS) ×1 IMPLANT
COVER SWIFTLINK CONNECTOR (BAG) ×2 IMPLANT
NDL TRANSEP BRK 71CM 407200 (NEEDLE) IMPLANT
NEEDLE TRANSEP BRK 71CM 407200 (NEEDLE) ×2 IMPLANT
PACK EP LATEX FREE (CUSTOM PROCEDURE TRAY) ×2
PACK EP LF (CUSTOM PROCEDURE TRAY) ×1 IMPLANT
PAD DEFIB LIFELINK (PAD) ×2 IMPLANT
PATCH CARTO3 (PAD) ×1 IMPLANT
SHEATH AVANTI 11F 11CM (SHEATH) ×1 IMPLANT
SHEATH PINNACLE 7F 10CM (SHEATH) ×2 IMPLANT
SHEATH PINNACLE 9F 10CM (SHEATH) ×1 IMPLANT
SHEATH SWARTZ TS SL2 63CM 8.5F (SHEATH) ×1 IMPLANT
TUBING SMART ABLATE COOLFLOW (TUBING) ×1 IMPLANT

## 2017-03-09 NOTE — Discharge Instructions (Signed)
No driving for 4 days. No lifting over 5 lbs for 1 week. No sexual activity for 1 week. You may return to work in 1 week. Keep procedure site clean & dry. If you notice increased pain, swelling, bleeding or pus, call/return!  You may shower, but no soaking baths/hot tubs/pools for 1 week.  ° ° °You have an appointment set up with the Atrial Fibrillation Clinic.  Multiple studies have shown that being followed by a dedicated atrial fibrillation clinic in addition to the standard care you receive from your other physicians improves health. We believe that enrollment in the atrial fibrillation clinic will allow us to better care for you.  ° °The phone number to the Atrial Fibrillation Clinic is [phone]. The clinic is staffed Monday through Friday from 8:30am to 5pm. ° °Parking Directions: The clinic is located in the Heart and Vascular Building connected to [facility]. °1)From [address] turn on to [address] and go to the 3rd entrance  (Heart and Vascular entrance) on the right. °2)Look to the right for Heart &Vascular Parking Garage. °3)A code for the entrance is required please call the clinic to receive this.   °4)Take the elevators to the 1st floor. Registration is in the room with the glass walls at the end of the hallway. ° °If you have any trouble parking or locating the clinic, please don’t hesitate to call [phone]. ° ° °

## 2017-03-09 NOTE — Transfer of Care (Signed)
Immediate Anesthesia Transfer of Care Note  Patient: Christy [name]  Procedure(s) Performed: Procedure(s): Atrial Fibrillation Ablation (N/A)  Patient Location: Cath Lab  Anesthesia Type:MAC  Level of Consciousness: awake, alert , oriented and patient cooperative  Airway & Oxygen Therapy: Patient Spontanous Breathing and Patient connected to nasal cannula oxygen  Post-op Assessment: Report given to RN, Post -op Vital signs reviewed and stable and Patient moving all extremities X 4  Post vital signs: Reviewed and stable  Last Vitals:  Vitals:   03/09/17 0554  BP: (!) 160/86  Pulse: 66  Resp: 18  Temp: 36.6 C  SpO2: 98%    Last Pain:  Vitals:   03/09/17 0554  TempSrc: Oral      Patients Stated Pain Goal: 3 (85/92/92 4462)  Complications: No apparent anesthesia complications

## 2017-03-09 NOTE — Progress Notes (Addendum)
Site area: RFV x 3 Site Prior to Removal:  Level 0 Pressure Applied For:45 min Manual: yes   Patient Status During Pull:  stable Post Pull Site:  Level  1 Post Pull Instructions Given:  yes Post Pull Pulses Present: palpable Dressing Applied:  tegaderm Bedrest begins @ 7366 till 1945 Comments: slightly raised area  Above insertion sites- seen by Alison Murray

## 2017-03-09 NOTE — Anesthesia Postprocedure Evaluation (Signed)
Anesthesia Post Note  Patient: Christy [name]  Procedure(s) Performed: Procedure(s) (LRB): Atrial Fibrillation Ablation (N/A)     Patient location during evaluation: PACU Anesthesia Type: MAC Level of consciousness: awake and alert Pain management: pain level controlled Vital Signs Assessment: post-procedure vital signs reviewed and stable Respiratory status: spontaneous breathing, nonlabored ventilation, respiratory function stable and patient connected to nasal cannula oxygen Cardiovascular status: stable and blood pressure returned to baseline Postop Assessment: no apparent nausea or vomiting Anesthetic complications: no    Last Vitals:  Vitals:   03/09/17 1340 03/09/17 1345  BP: 133/66 132/69  Pulse: 62 65  Resp: 12 11  Temp:    SpO2: 95% 99%    Last Pain:  Vitals:   03/09/17 1054  TempSrc: Temporal                 Tiajuana Amass

## 2017-03-09 NOTE — Anesthesia Procedure Notes (Signed)
Procedure Name: MAC Date/Time: 03/09/2017 7:30 AM Performed by: Mervyn Gay Pre-anesthesia Checklist: Patient identified, Patient being monitored, Timeout performed, Emergency Drugs available and Suction available Patient Re-evaluated:Patient Re-evaluated prior to induction Oxygen Delivery Method: Simple face mask and Nasal cannula Number of attempts: 1 Placement Confirmation: positive ETCO2 Dental Injury: Teeth and Oropharynx as per pre-operative assessment

## 2017-03-09 NOTE — Interval H&P Note (Signed)
History and Physical Interval Note:  03/09/2017 7:11 AM  Christy [name]  has presented today for surgery, with the diagnosis of afib  The various methods of treatment have been discussed with the patient and family. After consideration of risks, benefits and other options for treatment, the patient has consented to  Procedure(s): Atrial Fibrillation Ablation (N/A) as a surgical intervention .  The patient's history has been reviewed, patient examined, no change in status, stable for surgery.  I have reviewed the patient's chart and labs.  Questions were answered to the patient's satisfaction.     [name]

## 2017-03-09 NOTE — Discharge Summary (Signed)
ELECTROPHYSIOLOGY PROCEDURE DISCHARGE SUMMARY    Patient ID: Christy [name],  MRN: [id number], DOB/AGE: [birth year] 76 y.o.  Admit date: 03/09/2017 Discharge date: 03/10/2017  Primary Care Physician: Wenda Low, MD Primary Cardiologist: Meda Coffee Electrophysiologist: [name], MD  Primary Discharge Diagnosis:  Paroxysmal atrial fibrillation status post ablation this admission  Secondary Discharge Diagnosis:  1.  GERD 2.  Prior PE 3.  HTN  Procedures This Admission:  1.  Electrophysiology study and radiofrequency catheter ablation on 03/09/17 by Dr [name].  This study demonstrated sinus rhythm upon presentation; intracardiac echo reveals a moderately enlarged sized left atrium; successful electrical isolation and anatomical encircling of all four pulmonary veins with radiofrequency current; ectopic atrial tachycardia ablated along the roof of the right atrium; multiple atypical atrial flutter circuits, too numerous and unstable for mapping and ablation; dual AV nodal physiology without inducible AVNRT.  No retrograde AV conduction at baseline; no early apparent complications..    Brief HPI: Christy [name] is a 76 y.o. female with a history of paroxysmal atrial fibrillation.  They have failed medical therapy with Tikosyn. Risks, benefits, and alternatives to catheter ablation of atrial fibrillation were reviewed with the patient who wished to proceed.  The patient underwent TEE prior to the procedure which demonstrated normal LV function, no LAA thrombus, LA enlargement, and moderate MR.    Hospital Course:  The patient was admitted and underwent EPS/RFCA of atrial fibrillation with details as outlined above.  They were monitored on telemetry overnight which demonstrated sinus rhythm, PAC's, short runs AT.  Groin was without complication on the day of discharge.  The patient was examined and considered to be stable for discharge.  Wound care and restrictions were reviewed with  the patient.  The patient will be seen back by Roderic Palau, NP in 4 weeks and Dr Rayann Heman in 12 weeks for post ablation follow up.   This patients CHA2DS2-VASc Score and unadjusted Ischemic Stroke Rate (% per year) is equal to 4.8 % stroke rate/year from a score of 4 Above score calculated as 1 point each if present [CHF, HTN, DM, Vascular=MI/PAD/Aortic Plaque, Age if 65-74, or Female] Above score calculated as 2 points each if present [Age > 75, or Stroke/TIA/TE]   Physical Exam: Vitals:   03/09/17 2021 03/10/17 0110 03/10/17 0438 03/10/17 0534  BP: (!) 88/50 105/67 105/62 (!) 118/57  Pulse: 68 63 70 70  Resp: 16     Temp: 98.3 F (36.8 C)  99 F (37.2 C)   TempSrc: Oral  Oral   SpO2: 94% 93% 92%   Weight:   155 lb 9.6 oz (70.6 kg)   Height:        GEN- The patient is well appearing, alert and oriented x 3 today.   HEENT: normocephalic, atraumatic; sclera clear, conjunctiva pink; hearing intact; oropharynx clear; neck supple  Lungs- Clear to ausculation bilaterally, normal work of breathing.  No wheezes, rales, rhonchi Heart- Regular rate and rhythm, no murmurs, rubs or gallops  GI- soft, non-tender, non-distended, bowel sounds present  Extremities- no clubbing, cyanosis, or edema; DP/PT/radial pulses 2+ bilaterally, groin without hematoma/bruit MS- no significant deformity or atrophy Skin- warm and dry, no rash or lesion Psych- euthymic mood, full affect Neuro- strength and sensation are intact   Labs:   Lab Results  Component Value Date   WBC 4.9 02/27/2017   HGB 11.9 02/27/2017   HCT 36.5 02/27/2017   MCV 93 02/27/2017   PLT 232 02/27/2017  Recent Labs Lab 03/10/17 0456  NA 134*  K 3.3*  CL 99*  CO2 28  BUN 18  CREATININE 0.85  CALCIUM 8.8*  GLUCOSE 108*     Discharge Medications:  Allergies as of 03/10/2017      Reactions   Epinephrine Palpitations      Medication List    TAKE these medications   apixaban 5 MG Tabs tablet Commonly known as:   ELIQUIS TAKE 1 TABLET BY MOUTH TWICE DAILY(START 02/03/2016)   dofetilide 250 MCG capsule Commonly known as:  TIKOSYN Take 1 capsule (250 mcg total) by mouth 2 (two) times daily.   fluticasone 50 MCG/ACT nasal spray Commonly known as:  FLONASE Place 1 spray into both nostrils daily.   furosemide 20 MG tablet Commonly known as:  LASIX Take 1 tablet (20 mg total) by mouth daily as needed for fluid or edema (weight gain of 3 lbs in 24 hrs or 5 lbs in 1 week).   loratadine 10 MG tablet Commonly known as:  CLARITIN Take 10 mg by mouth daily.   Magnesium 200 MG Tabs Take 1 tablet (200 mg total) by mouth daily.   pantoprazole 40 MG tablet Commonly known as:  PROTONIX Take 40 mg by mouth daily.   propranolol 20 MG tablet Commonly known as:  INDERAL Take 1 tablet (20 mg total) by mouth 2 (two) times daily.            Discharge Care Instructions        Start     Ordered   03/09/17 0000  Increase activity slowly     03/09/17 1600   03/09/17 0000  Diet - low sodium heart healthy     03/09/17 1600      Disposition:  Discharge Instructions    Diet - low sodium heart healthy    Complete by:  As directed    Increase activity slowly    Complete by:  As directed      Follow-up Information    [facility] Follow up on 04/06/2017.   Specialty:  Cardiology Why:  at [facility] information: [address] [id number] [place] [address] [phone]       [name], MD Follow up on 06/19/2017.   Specialty:  Cardiology Why:  at [facility] information: [place] [address] [phone]           Duration of Discharge Encounter: Greater than 30 minutes including physician time.  Signed, Chanetta Marshall, NP 03/10/2017 7:53 AM  I have seen, examined the patient, and reviewed the above assessment and plan.  On exam, RRR.  Changes to above are made where necessary.  DC to home with  routine post ablation care.  Co Sign: [name], MD 03/10/2017 8:44 AM

## 2017-03-09 NOTE — H&P (View-Only) (Signed)
Electrophysiology Office Note   Date:  02/27/2017   ID:  Lovelee, Forner [birth year], MRN [id number]  PCP:  Wenda Low, MD  Cardiologist:  Dr Meda Coffee Primary Electrophysiologist: [name], MD    Chief Complaint  Patient presents with  . Atrial Fibrillation     History of Present Illness: Christy [name] is a 76 y.o. female who presents today for electrophysiology evaluation.   She continues to have intermittent palpitations.  She recently wore an event monitor which captured frequent PACs and nonsustained atach.  These episodes appear to be primarily long RP and likely atach, though there is a more regular short RP arrhythmia at times.  She has reduce alcohol to only 1 drink per day.  Today, she denies symptoms of chest pain, shortness of breath, orthopnea, PND, lower extremity edema, claudication, dizziness, presyncope, syncope, bleeding, or neurologic sequela. The patient is tolerating medications without difficulties and is otherwise without complaint today.    Past Medical History:  Diagnosis Date  . Atrial fibrillation ([place])   . Bilateral lower extremity edema    Chronic, likely related to venous stasis   . DJD (degenerative joint disease), lumbosacral   . Dyslipidemia   . Essential hypertension    "dx'd recently" (10/06/2016)  . GERD (gastroesophageal reflux disease)   . History of hepatitis C virus infection ~ 1997   In remission  . Lung granuloma ([place]) 2004    CT scan  . Osteopenia   . Paroxysmal atrial tachycardia ([place])   . PE (pulmonary embolism) 01/2016  . Spondylolisthesis    Status post epidural injections 3 by neurosurgery  - Dr. Joya Salm   . Varicose veins of both legs with edema   . Vitamin D deficiency    Past Surgical History:  Procedure Laterality Date  . BACK SURGERY    . CARDIOVERSION N/A 03/21/2016   Procedure: CARDIOVERSION;  Surgeon: [name], MD;  Location: [facility];  Service: Cardiovascular;  Laterality: N/A;  . COMBINED  HYSTEROSCOPY DIAGNOSTIC / D&C  2005  . COMBINED HYSTEROSCOPY DIAGNOSTIC / D&C  1999  . DILATION AND CURETTAGE OF UTERUS    . [place]   "went in front and back; broken neck"  . TOE SURGERY Right    replaced joint  . TONSILLECTOMY    . TUBAL LIGATION Bilateral 1976  . VAGINAL DELIVERY     x2     Current Outpatient Prescriptions  Medication Sig Dispense Refill  . apixaban (ELIQUIS) 5 MG TABS tablet TAKE 1 TABLET BY MOUTH TWICE DAILY(START 02/03/2016) 180 tablet 2  . dofetilide (TIKOSYN) 250 MCG capsule Take 1 capsule (250 mcg total) by mouth 2 (two) times daily. 60 capsule 6  . furosemide (LASIX) 20 MG tablet Take 1 tablet (20 mg total) by mouth daily as needed for fluid or edema (weight gain of 3 lbs in 24 hrs or 5 lbs in 1 week). 30 tablet 3  . loratadine (CLARITIN) 10 MG tablet Take 10 mg by mouth daily.    . Magnesium 200 MG TABS Take 1 tablet (200 mg total) by mouth daily. 30 each   . pantoprazole (PROTONIX) 40 MG tablet Take 40 mg by mouth daily.    . propranolol (INDERAL) 20 MG tablet Take 1 tablet (20 mg total) by mouth 2 (two) times daily. 180 tablet 1   No current facility-administered medications for this visit.     Allergies:   Epinephrine   Social History:  The patient  reports that she has never smoked. She has never used smokeless tobacco. She reports that she drinks about 4.2 oz of alcohol per week . She reports that she does not use drugs.   Family History:  The patient's  family history includes CVA in her father and mother; Heart Problems in her mother; Heart attack (age of onset: 37) in her mother.    ROS:  Please see the history of present illness.   All other systems are personally reviewed and negative.    PHYSICAL EXAM: VS:  BP 136/82   Pulse (!) 59   Ht 5' 7.5" (1.715 m)   Wt 155 lb (70.3 kg)   SpO2 98%   BMI 23.92 kg/m  , BMI Body mass index is 23.92 kg/m. GEN: Well nourished, well developed, in no acute distress  HEENT: normal  Neck:  no JVD, carotid bruits, or masses Cardiac: RRR; no murmurs, rubs, or gallops,trace edema  Respiratory:  clear to auscultation bilaterally, normal work of breathing GI: soft, nontender, nondistended, + BS MS: no deformity or atrophy  Skin: warm and dry  Neuro:  Strength and sensation are intact Psych: euthymic mood, full affect  EKG:  EKG is ordered today. The ekg ordered today is personally reviewed and shows sinus rhythm 59 bpm, PR 244 msec   Recent Labs: 10/03/2016: Hemoglobin WILL FOLLOW; Platelets WILL FOLLOW 11/02/2016: BUN 24; Creatinine, Ser 0.82; Magnesium 2.1; NT-Pro BNP 368; Potassium 4.4; Sodium 139  personally reviewed   Lipid Panel  No results found for: CHOL, TRIG, HDL, CHOLHDL, VLDL, LDLCALC, LDLDIRECT personally reviewed   Wt Readings from Last 3 Encounters:  02/27/17 155 lb (70.3 kg)  11/09/16 156 lb 6.4 oz (70.9 kg)  11/02/16 155 lb (70.3 kg)      Other studies personally reviewed: Additional studies/ records that were reviewed today include: prior notes, recent event monitor (personally reviewed in detail), prior echo  Review of the above records today demonstrates: as above   ASSESSMENT AND PLAN:  1.  Persistent afib The patient has symptomatic atrial arrhythmias including afib, atrach tachycardia, and possibly a reentrant arrhythmia.  She has severe LA enlargement.  She has failed medical therapy with tikosyn. Therapeutic strategies for afib and SVT including medicine and ablation were discussed in detail with the patient today. Risk, benefits, and alternatives to EP study and radiofrequency ablation were also discussed in detail today. These risks include but are not limited to stroke, bleeding, vascular damage, tamponade, perforation, damage to the esophagus, lungs, and other structures, pulmonary vein stenosis, worsening renal function, and death. The patient understands these risk and wishes to proceed.  We will therefore proceed with catheter ablation at  the next available time.  TEE is anticipated prior to ablation to exclude LAA thrombus.  2. HTN Stable No change required today    Current medicines are reviewed at length with the patient today.   The patient does not have concerns regarding her medicines.  The following changes were made today:  none    Signed, [name], MD  02/27/2017 8:57 AM     [facility] [address] [place] [address] [phone] (office) [phone] (fax)

## 2017-03-10 DIAGNOSIS — I480 Paroxysmal atrial fibrillation: Secondary | ICD-10-CM | POA: Diagnosis not present

## 2017-03-10 DIAGNOSIS — I484 Atypical atrial flutter: Secondary | ICD-10-CM | POA: Diagnosis not present

## 2017-03-10 LAB — BASIC METABOLIC PANEL
Anion gap: 7 (ref 5–15)
BUN: 18 mg/dL (ref 6–20)
CO2: 28 mmol/L (ref 22–32)
Calcium: 8.8 mg/dL — ABNORMAL LOW (ref 8.9–10.3)
Chloride: 99 mmol/L — ABNORMAL LOW (ref 101–111)
Creatinine, Ser: 0.85 mg/dL (ref 0.44–1.00)
GFR calc Af Amer: >60 mL/min (ref >60)
GFR calc non Af Amer: >60 mL/min (ref >60)
Glucose, Bld: 108 mg/dL — ABNORMAL HIGH (ref 65–99)
Potassium: 3.3 mmol/L — ABNORMAL LOW (ref 3.5–5.1)
Sodium: 134 mmol/L — ABNORMAL LOW (ref 135–145)

## 2017-03-10 LAB — MAGNESIUM: Magnesium: 1.7 mg/dL (ref 1.7–2.4)

## 2017-03-10 MED ORDER — POTASSIUM CHLORIDE CRYS ER 20 MEQ PO TBCR
40.0000 meq | EXTENDED_RELEASE_TABLET | Freq: Once | ORAL | Status: AC
  Administered 2017-03-10: 40 meq via ORAL
  Filled 2017-03-10: qty 2

## 2017-03-10 NOTE — Progress Notes (Signed)
Patient requesting to ambulate to bathroom.  Right groin site checked prior to ambulating to bathroom and once patient back in bed from bathroom groin site checked again.  No change in right groin site noted.

## 2017-03-12 ENCOUNTER — Telehealth: Payer: Self-pay | Admitting: Nurse Practitioner

## 2017-03-12 NOTE — Telephone Encounter (Signed)
   Pt is s/p afib ablation on 9/20, d/c'd 9/21.  She has been doing well but noted malaise this AM.  Temp 101. She took some tylenol earlier and feels a little better.  She has not repeated temperature.  Procedure groin sites appear to be healing well to her.  No redness or drainage.  No cough/congestion/pleuritic c/p.  I rec that if Temp rises above 101.3 again later, she may take additional tylenol.  I encouraged warm fluids and rest.  If temp rises above 102 or worsening of symptoms, she should present to the ED for eval/cxr/labs.   Caller verbalized understanding and was grateful for the call back.  Murray Hodgkins, NP 03/12/2017, 2:52 PM

## 2017-03-16 ENCOUNTER — Telehealth: Payer: Self-pay | Admitting: Internal Medicine

## 2017-03-16 NOTE — Telephone Encounter (Signed)
New message  Enlarged knot at site of ablation    1. Has your device fired? no 2. Is you device beeping? no  3. Are you experiencing draining or swelling at device site? yes 4. Are you calling to see if we received your device transmission? no  5. Have you passed out? no   Please route to [place]

## 2017-03-17 NOTE — Telephone Encounter (Signed)
Follow up     Pt is calling about site of her ablation. She said something has come up and she would like to speak with nurse about it. Please call.

## 2017-03-17 NOTE — Telephone Encounter (Signed)
Pt reports groin "knot" and wants to make sure there is nothing wrong.   Describes it as walnut size, no drainage, no redness, no pain and no heat to the area. Educated patient about groin site post ablation and advised her to call office if it gets larger and we will bring her in to assess if needed.   She also reports increased HRs occasionally since ablation.  Yesterday she reports episode that lasted about 10 min.  She took an extra propranolol and that helped. Educated about breakthrough AFib up to 3 months post ablation.  Advised to call the office if she stays "out of rhythm" for several hours and/or symptoms begin.  Patient verbalized understanding and agreeable to plan.

## 2017-03-23 DIAGNOSIS — L821 Other seborrheic keratosis: Secondary | ICD-10-CM | POA: Diagnosis not present

## 2017-03-23 DIAGNOSIS — Z85828 Personal history of other malignant neoplasm of skin: Secondary | ICD-10-CM | POA: Diagnosis not present

## 2017-03-23 DIAGNOSIS — L570 Actinic keratosis: Secondary | ICD-10-CM | POA: Diagnosis not present

## 2017-03-23 DIAGNOSIS — L820 Inflamed seborrheic keratosis: Secondary | ICD-10-CM | POA: Diagnosis not present

## 2017-03-23 DIAGNOSIS — D1801 Hemangioma of skin and subcutaneous tissue: Secondary | ICD-10-CM | POA: Diagnosis not present

## 2017-03-23 DIAGNOSIS — L814 Other melanin hyperpigmentation: Secondary | ICD-10-CM | POA: Diagnosis not present

## 2017-04-05 DIAGNOSIS — Z6824 Body mass index (BMI) 24.0-24.9, adult: Secondary | ICD-10-CM | POA: Diagnosis not present

## 2017-04-05 DIAGNOSIS — Z124 Encounter for screening for malignant neoplasm of cervix: Secondary | ICD-10-CM | POA: Diagnosis not present

## 2017-04-06 ENCOUNTER — Ambulatory Visit (HOSPITAL_COMMUNITY)
Admit: 2017-04-06 | Discharge: 2017-04-06 | Disposition: A | Discharge status: Home / Self Care | Payer: PPO | Attending: Nurse Practitioner | Admitting: Nurse Practitioner

## 2017-04-06 VITALS — BP 122/70 | HR 64 | Ht 67.5 in | Wt 154.8 lb

## 2017-04-06 DIAGNOSIS — Z9889 Other specified postprocedural states: Secondary | ICD-10-CM | POA: Insufficient documentation

## 2017-04-06 DIAGNOSIS — I440 Atrioventricular block, first degree: Secondary | ICD-10-CM | POA: Diagnosis not present

## 2017-04-06 DIAGNOSIS — Z7901 Long term (current) use of anticoagulants: Secondary | ICD-10-CM | POA: Diagnosis not present

## 2017-04-06 DIAGNOSIS — M8580 Other specified disorders of bone density and structure, unspecified site: Secondary | ICD-10-CM | POA: Diagnosis not present

## 2017-04-06 DIAGNOSIS — B1920 Unspecified viral hepatitis C without hepatic coma: Secondary | ICD-10-CM | POA: Insufficient documentation

## 2017-04-06 DIAGNOSIS — J8410 Pulmonary fibrosis, unspecified: Secondary | ICD-10-CM | POA: Diagnosis not present

## 2017-04-06 DIAGNOSIS — I10 Essential (primary) hypertension: Secondary | ICD-10-CM | POA: Insufficient documentation

## 2017-04-06 DIAGNOSIS — Z823 Family history of stroke: Secondary | ICD-10-CM | POA: Diagnosis not present

## 2017-04-06 DIAGNOSIS — Z86711 Personal history of pulmonary embolism: Secondary | ICD-10-CM | POA: Diagnosis not present

## 2017-04-06 DIAGNOSIS — R600 Localized edema: Secondary | ICD-10-CM | POA: Insufficient documentation

## 2017-04-06 DIAGNOSIS — Z79899 Other long term (current) drug therapy: Secondary | ICD-10-CM | POA: Diagnosis not present

## 2017-04-06 DIAGNOSIS — E785 Hyperlipidemia, unspecified: Secondary | ICD-10-CM | POA: Insufficient documentation

## 2017-04-06 DIAGNOSIS — E559 Vitamin D deficiency, unspecified: Secondary | ICD-10-CM | POA: Diagnosis not present

## 2017-04-06 DIAGNOSIS — Z86718 Personal history of other venous thrombosis and embolism: Secondary | ICD-10-CM | POA: Diagnosis not present

## 2017-04-06 DIAGNOSIS — K219 Gastro-esophageal reflux disease without esophagitis: Secondary | ICD-10-CM | POA: Diagnosis not present

## 2017-04-06 DIAGNOSIS — I480 Paroxysmal atrial fibrillation: Secondary | ICD-10-CM | POA: Diagnosis not present

## 2017-04-06 DIAGNOSIS — M47897 Other spondylosis, lumbosacral region: Secondary | ICD-10-CM | POA: Insufficient documentation

## 2017-04-06 LAB — BASIC METABOLIC PANEL
Anion gap: 7 (ref 5–15)
BUN: 22 mg/dL — ABNORMAL HIGH (ref 6–20)
CO2: 27 mmol/L (ref 22–32)
Calcium: 9.4 mg/dL (ref 8.9–10.3)
Chloride: 104 mmol/L (ref 101–111)
Creatinine, Ser: 0.72 mg/dL (ref 0.44–1.00)
GFR calc Af Amer: >60 mL/min (ref >60)
GFR calc non Af Amer: >60 mL/min (ref >60)
Glucose, Bld: 83 mg/dL (ref 65–99)
Potassium: 4.2 mmol/L (ref 3.5–5.1)
Sodium: 138 mmol/L (ref 135–145)

## 2017-04-06 LAB — MAGNESIUM: Magnesium: 2 mg/dL (ref 1.7–2.4)

## 2017-04-06 NOTE — Progress Notes (Signed)
Primary Care Physician: Wenda Low, MD Referring Physician: Dr. Caprice Renshaw Christy [name] is a 76 y.o. female with a h/o atrial fib and PE/DVT 01/2016. She is has been treated with Rythmol and Tikosyn in the past. When her burden increased on Tikosyn, it was decided to pursue ablation.  She is in the afib clinic today for f/u ablation, performed 03/08/17. She reports that she is doing very well. A few flutters right after ablation, none since. She continues on Tikosyn,  No swallowing or groin issues.Continues eliquis for a chadsvasc score of at least 3.  Today, she denies symptoms of palpitations, chest pain, shortness of breath, orthopnea, PND, lower extremity edema, dizziness, presyncope, syncope, or neurologic sequela. The patient is tolerating medications without difficulties and is otherwise without complaint today.   Past Medical History:  Diagnosis Date  . Atrial fibrillation ([place])   . Bilateral lower extremity edema    Chronic, likely related to venous stasis   . DJD (degenerative joint disease), lumbosacral   . Dyslipidemia   . Essential hypertension    "dx'd recently" (10/06/2016)  . GERD (gastroesophageal reflux disease)   . History of hepatitis C virus infection ~ 1997   In remission  . Lung granuloma ([place]) 2004    CT scan  . Osteopenia   . Paroxysmal atrial tachycardia ([place])   . PE (pulmonary embolism) 01/2016  . Spondylolisthesis    Status post epidural injections 3 by neurosurgery  - Dr. Joya Salm   . Varicose veins of both legs with edema   . Vitamin D deficiency    Past Surgical History:  Procedure Laterality Date  . ATRIAL FIBRILLATION ABLATION N/A 03/09/2017   Procedure: Atrial Fibrillation Ablation;  Surgeon: [name], MD;  Location: [place] [facility];  Service: Cardiovascular;  Laterality: N/A;  . BACK SURGERY    . CARDIOVERSION N/A 03/21/2016   Procedure: CARDIOVERSION;  Surgeon: [name], MD;  Location: [facility];  Service:  Cardiovascular;  Laterality: N/A;  . COMBINED HYSTEROSCOPY DIAGNOSTIC / D&C  2005  . COMBINED HYSTEROSCOPY DIAGNOSTIC / D&C  1999  . DILATION AND CURETTAGE OF UTERUS    . [place]   "went in front and back; broken neck"  . TEE WITHOUT CARDIOVERSION N/A 03/08/2017   Procedure: TRANSESOPHAGEAL ECHOCARDIOGRAM (TEE);  Surgeon: Josue Hector, MD;  Location: [facility];  Service: Cardiovascular;  Laterality: N/A;  . TOE SURGERY Right    replaced joint  . TONSILLECTOMY    . TUBAL LIGATION Bilateral 1976  . VAGINAL DELIVERY     x2    Current Outpatient Prescriptions  Medication Sig Dispense Refill  . apixaban (ELIQUIS) 5 MG TABS tablet TAKE 1 TABLET BY MOUTH TWICE DAILY(START 02/03/2016) 180 tablet 2  . dofetilide (TIKOSYN) 250 MCG capsule Take 1 capsule (250 mcg total) by mouth 2 (two) times daily. 60 capsule 6  . fluticasone (FLONASE) 50 MCG/ACT nasal spray Place 1 spray into both nostrils daily.    . furosemide (LASIX) 20 MG tablet Take 1 tablet (20 mg total) by mouth daily as needed for fluid or edema (weight gain of 3 lbs in 24 hrs or 5 lbs in 1 week). 30 tablet 3  . loratadine (CLARITIN) 10 MG tablet Take 10 mg by mouth daily.    . Magnesium 200 MG TABS Take 1 tablet (200 mg total) by mouth daily. 30 each   . pantoprazole (PROTONIX) 40 MG tablet Take 40 mg by mouth daily.    [place] Kitchen  propranolol (INDERAL) 20 MG tablet Take 1 tablet (20 mg total) by mouth 2 (two) times daily. 180 tablet 1   No current facility-administered medications for this encounter.     Allergies  Allergen Reactions  . Epinephrine Palpitations    Social History   Social History  . Marital status: Married    Spouse name: N/A  . Number of children: N/A  . Years of education: N/A   Occupational History  . Not on file.   Social History Main Topics  . Smoking status: Never Smoker  . Smokeless tobacco: Never Used  . Alcohol use 4.2 oz/week    7 Glasses of wine per week     Comment: 10/06/2016 "wine  daily"  . Drug use: No  . Sexual activity: Yes    Birth control/ protection: Other-see comments     Comment: tubal ligation   Other Topics Concern  . Not on file   Social History Narrative   She is a married mother of 1. Lives with her husband. 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 She has never smoked and drinks up to 10 glasses of wine or so week.   She usually exercises roughly 3 days a week doing yoga and plays golf. She is mostly limited by her "time constraints "    Family History  Problem Relation Age of Onset  . Heart Problems Mother   . CVA Mother   . Heart attack Mother 39  . CVA Father   . Colon cancer Neg Hx   . Clotting disorder Neg Hx     ROS- All systems are reviewed and negative except as per the HPI above  Physical Exam: Vitals:   04/06/17 0955  Weight: 154 lb 12.8 oz (70.2 kg)  Height: 5' 7.5" (1.715 m)   Wt Readings from Last 3 Encounters:  04/06/17 154 lb 12.8 oz (70.2 kg)  03/10/17 155 lb 9.6 oz (70.6 kg)  03/08/17 155 lb (70.3 kg)    Labs: Lab Results  Component Value Date   NA 134 (L) 03/10/2017   K 3.3 (L) 03/10/2017   CL 99 (L) 03/10/2017   CO2 28 03/10/2017   GLUCOSE 108 (H) 03/10/2017   BUN 18 03/10/2017   CREATININE 0.85 03/10/2017   CALCIUM 8.8 (L) 03/10/2017   MG 1.7 03/10/2017   Lab Results  Component Value Date   INR 1.1 03/17/2016   No results found for: CHOL, HDL, LDLCALC, TRIG   GEN- The patient is well appearing, alert and oriented x 3 today.   Head- normocephalic, atraumatic Eyes-  Sclera clear, conjunctiva pink Ears- hearing intact Oropharynx- clear Neck- supple, no JVP Lymph- no cervical lymphadenopathy Lungs- Clear to ausculation bilaterally, normal work of breathing Heart- Regular rate and rhythm, no murmurs, rubs or gallops, PMI not laterally displaced GI- soft, NT, ND, + BS Extremities- no clubbing, cyanosis, or edema MS- no significant deformity or atrophy Skin- no rash or lesion Psych-  euthymic mood, full affect Neuro- strength and sensation are intact  EKG-NSR with first degree AV block, v rate of 64 bpm, pr int 250 ms, qrs int 76 ms, qtc 445 ms Echo-LV EF: 55% -   60%    Assessment and Plan: 1. Paroxysmal atrial fibrillation S/p ablation 9/19 and staying in SR Continue dofetilide 250 mcg bid Continue eliquis with chadsvasc score of 3, reminded no missed doses during the 3 month recovery period [facility]. Daeshon [employer], [place]  [facility] [address] [place], [place] [address] [phone]

## 2017-04-17 DIAGNOSIS — Z1231 Encounter for screening mammogram for malignant neoplasm of breast: Secondary | ICD-10-CM | POA: Diagnosis not present

## 2017-05-08 ENCOUNTER — Other Ambulatory Visit: Payer: Self-pay | Admitting: *Deleted

## 2017-05-08 MED ORDER — DOFETILIDE 250 MCG PO CAPS: 250.0000 ug | ORAL_CAPSULE | Freq: Two times a day (BID) | ORAL | 9 refills | Status: DC

## 2017-05-13 DIAGNOSIS — M25562 Pain in left knee: Secondary | ICD-10-CM | POA: Diagnosis not present

## 2017-05-13 DIAGNOSIS — M25561 Pain in right knee: Secondary | ICD-10-CM | POA: Diagnosis not present

## 2017-05-28 ENCOUNTER — Other Ambulatory Visit: Payer: Self-pay | Admitting: Cardiology

## 2017-06-02 DIAGNOSIS — K219 Gastro-esophageal reflux disease without esophagitis: Secondary | ICD-10-CM | POA: Diagnosis not present

## 2017-06-02 DIAGNOSIS — I2699 Other pulmonary embolism without acute cor pulmonale: Secondary | ICD-10-CM | POA: Diagnosis not present

## 2017-06-02 DIAGNOSIS — E782 Mixed hyperlipidemia: Secondary | ICD-10-CM | POA: Diagnosis not present

## 2017-06-02 DIAGNOSIS — N182 Chronic kidney disease, stage 2 (mild): Secondary | ICD-10-CM | POA: Diagnosis not present

## 2017-06-02 DIAGNOSIS — I4891 Unspecified atrial fibrillation: Secondary | ICD-10-CM | POA: Diagnosis not present

## 2017-06-02 DIAGNOSIS — I82402 Acute embolism and thrombosis of unspecified deep veins of left lower extremity: Secondary | ICD-10-CM | POA: Diagnosis not present

## 2017-06-02 DIAGNOSIS — B1920 Unspecified viral hepatitis C without hepatic coma: Secondary | ICD-10-CM | POA: Diagnosis not present

## 2017-06-02 DIAGNOSIS — Z1389 Encounter for screening for other disorder: Secondary | ICD-10-CM | POA: Diagnosis not present

## 2017-06-02 DIAGNOSIS — Z0000 Encounter for general adult medical examination without abnormal findings: Secondary | ICD-10-CM | POA: Diagnosis not present

## 2017-06-02 DIAGNOSIS — R7309 Other abnormal glucose: Secondary | ICD-10-CM | POA: Diagnosis not present

## 2017-06-02 DIAGNOSIS — I5030 Unspecified diastolic (congestive) heart failure: Secondary | ICD-10-CM | POA: Diagnosis not present

## 2017-06-07 ENCOUNTER — Encounter: Payer: Self-pay | Admitting: Internal Medicine

## 2017-06-07 ENCOUNTER — Ambulatory Visit (INDEPENDENT_AMBULATORY_CARE_PROVIDER_SITE_OTHER): Payer: PPO | Admitting: Internal Medicine

## 2017-06-07 VITALS — BP 112/76 | HR 63 | Ht 67.5 in | Wt 157.8 lb

## 2017-06-07 DIAGNOSIS — I480 Paroxysmal atrial fibrillation: Secondary | ICD-10-CM

## 2017-06-07 DIAGNOSIS — I10 Essential (primary) hypertension: Secondary | ICD-10-CM | POA: Diagnosis not present

## 2017-06-07 MED ORDER — PROPRANOLOL HCL 20 MG PO TABS: 20.0000 mg | ORAL_TABLET | Freq: Two times a day (BID) | ORAL | 3 refills | Status: AC

## 2017-06-07 MED ORDER — APIXABAN 5 MG PO TABS: ORAL_TABLET | ORAL | 3 refills | Status: DC

## 2017-06-07 MED ORDER — DOFETILIDE 250 MCG PO CAPS: 250.0000 ug | ORAL_CAPSULE | Freq: Two times a day (BID) | ORAL | 3 refills | Status: DC

## 2017-06-07 NOTE — Progress Notes (Signed)
PCP: Wenda Low, MD Primary Cardiologist: Dr Fidel Levy is a 76 y.o. female who presents today for routine electrophysiology followup.  Since his recent afib ablation, the patient reports doing very well.  she denies procedure related complications and is pleased with the results of the procedure.  She and her husband have [employer] basketball season tickets and have enjoyed going to games this season. Today, she denies symptoms of palpitations, chest pain, shortness of breath,  lower extremity edema, dizziness, presyncope, or syncope.  The patient is otherwise without complaint today.   Past Medical History:  Diagnosis Date  . Atrial fibrillation ([place])   . Bilateral lower extremity edema    Chronic, likely related to venous stasis   . DJD (degenerative joint disease), lumbosacral   . Dyslipidemia   . Essential hypertension    "dx'd recently" (10/06/2016)  . GERD (gastroesophageal reflux disease)   . History of hepatitis C virus infection ~ 1997   In remission  . Lung granuloma ([place]) 2004    CT scan  . Osteopenia   . Paroxysmal atrial tachycardia ([place])   . PE (pulmonary embolism) 01/2016  . Spondylolisthesis    Status post epidural injections 3 by neurosurgery  - Dr. Joya Salm   . Varicose veins of both legs with edema   . Vitamin D deficiency    Past Surgical History:  Procedure Laterality Date  . ATRIAL FIBRILLATION ABLATION N/A 03/09/2017   Procedure: Atrial Fibrillation Ablation;  Surgeon: [name], MD;  Location: [place] [facility];  Service: Cardiovascular;  Laterality: N/A;  . BACK SURGERY    . CARDIOVERSION N/A 03/21/2016   Procedure: CARDIOVERSION;  Surgeon: [name], MD;  Location: [place];  Service: Cardiovascular;  Laterality: N/A;  . COMBINED HYSTEROSCOPY DIAGNOSTIC / D&C  2005  . COMBINED HYSTEROSCOPY DIAGNOSTIC / D&C  1999  . DILATION AND CURETTAGE OF UTERUS    . [place]   "went in front and back; broken neck"  . TEE  WITHOUT CARDIOVERSION N/A 03/08/2017   Procedure: TRANSESOPHAGEAL ECHOCARDIOGRAM (TEE);  Surgeon: Josue Hector, MD;  Location: [facility] ENDOSCOPY;  Service: Cardiovascular;  Laterality: N/A;  . TOE SURGERY Right    replaced joint  . TONSILLECTOMY    . TUBAL LIGATION Bilateral 1976  . VAGINAL DELIVERY     x2    ROS- all systems are personally reviewed and negatives except as per HPI above  Current Outpatient Medications  Medication Sig Dispense Refill  . apixaban (ELIQUIS) 5 MG TABS tablet TAKE 1 TABLET BY MOUTH TWICE DAILY(START 02/03/2016) 180 tablet 2  . dofetilide (TIKOSYN) 250 MCG capsule Take 1 capsule (250 mcg total) 2 (two) times daily by mouth. 60 capsule 9  . ELIQUIS 5 MG TABS tablet TAKE 1 TABLET BY MOUTH TWICE DAILY(START 02/03/2016) 60 tablet 0  . fluticasone (FLONASE) 50 MCG/ACT nasal spray Place 1 spray into both nostrils daily.    Marland Kitchen FLUZONE HIGH-DOSE 0.5 ML injection Inject as directed once.  0  . furosemide (LASIX) 20 MG tablet Take 1 tablet (20 mg total) by mouth daily as needed for fluid or edema (weight gain of 3 lbs in 24 hrs or 5 lbs in 1 week). 30 tablet 3  . loratadine (CLARITIN) 10 MG tablet Take 10 mg by mouth daily.    . Magnesium 200 MG TABS Take 1 tablet (200 mg total) by mouth daily. 30 each   . pantoprazole (PROTONIX) 40 MG tablet Take 40 mg  by mouth daily.    . propranolol (INDERAL) 20 MG tablet Take 1 tablet (20 mg total) by mouth 2 (two) times daily. 180 tablet 1   No current facility-administered medications for this visit.     Physical Exam: Vitals:   06/07/17 1408  BP: 112/76  Pulse: 63  SpO2: 97%  Weight: 157 lb 12.8 oz (71.6 kg)  Height: 5' 7.5" (1.715 m)    GEN- The patient is well appearing, alert and oriented x 3 today.   Head- normocephalic, atraumatic Eyes-  Sclera clear, conjunctiva pink Ears- hearing intact Oropharynx- clear Lungs- Clear to ausculation bilaterally, normal work of breathing Heart- Regular rate and rhythm, no murmurs,  rubs or gallops, PMI not laterally displaced GI- soft, NT, ND, + BS Extremities- no clubbing, cyanosis, or edema  EKG tracing ordered today is personally reviewed and shows sinus rhythm 63 bpm, PR 240 msec, Qtc 431 msec  Assessment and Plan:  1. Paroxysmal atrial fibrillation Doing well s/p ablation.  Her husband reports that she is a different person! She wishes to  [place] while she is in [place] this winter.  She may think about stopping it upon return. chads2vasc score is 3.  Continue anticoagulation  2. HTN Stable No change required today  Return to see me in April upon return from [place] MD, [facility] 06/07/2017 2:23 PM

## 2017-06-07 NOTE — Patient Instructions (Addendum)
Medication Instructions:  Your physician recommends that you continue on your current medications as directed. Please refer to the Current Medication list given to you today.  Labwork: None ordered.  Testing/Procedures: None ordered.  Follow-Up: Your physician wants you to follow-up in: 4 months with Dr. Allred.      Any Other Special Instructions Will Be Listed Below (If Applicable).  If you need a refill on your cardiac medications before your next appointment, please call your pharmacy.   

## 2017-06-19 ENCOUNTER — Ambulatory Visit: Payer: PPO | Admitting: Internal Medicine

## 2017-08-16 DIAGNOSIS — B964 Proteus (mirabilis) (morganii) as the cause of diseases classified elsewhere: Secondary | ICD-10-CM | POA: Diagnosis not present

## 2017-08-16 DIAGNOSIS — Z79899 Other long term (current) drug therapy: Secondary | ICD-10-CM | POA: Diagnosis not present

## 2017-08-16 DIAGNOSIS — I499 Cardiac arrhythmia, unspecified: Secondary | ICD-10-CM | POA: Diagnosis not present

## 2017-08-16 DIAGNOSIS — N390 Urinary tract infection, site not specified: Secondary | ICD-10-CM | POA: Diagnosis not present

## 2017-08-16 DIAGNOSIS — Z7901 Long term (current) use of anticoagulants: Secondary | ICD-10-CM | POA: Diagnosis not present

## 2017-09-18 ENCOUNTER — Telehealth: Payer: Self-pay | Admitting: Internal Medicine

## 2017-09-18 NOTE — Telephone Encounter (Signed)
New Message:    STAT if HR is under 50 or over 120 (normal HR is 60-100 beats per minute)  1) What is your heart rate? 110/112  2) Do you have a log of your heart rate readings (document readings)? 10 am -6 pm Hr was about the same as stated above.  3) Do you have any other symptoms? No   Pt states BP was fine

## 2017-09-18 NOTE — Telephone Encounter (Signed)
Spoke with patient who is concerned about her HR lately..  She had an ablation in September and went for their 3 month stay in [place]..  Return on 4/5.Marland Kitchen The rhythm has been fine but her HR started increasing to 110-115 recently.   The episodes last about 1 Hr and she takes supplemental propranolol, which seems to help for a period of time.   This afternoon it started up again with BP 115/72 and HR 116...  She took supplemental Propranolol and after a while, BP 124/65 and HR to 59... She says that it is just all over the place..  She has an appointment in the next few weeks with you.Marland Kitchen  Please advise, thank you.Marland Kitchen

## 2017-09-19 NOTE — Telephone Encounter (Signed)
Spoke with patient and gave her Dr. Jackalyn Lombard recommendations..  She verbalized understanding.Marland Kitchen

## 2017-09-19 NOTE — Telephone Encounter (Signed)
Keep scheduled visit with me 4/15.  Can go to AF clinic if symptoms become more worrisome in the interim.

## 2017-09-27 ENCOUNTER — Ambulatory Visit: Payer: PPO | Admitting: Internal Medicine

## 2017-09-28 IMAGING — NM NM MISC PROCEDURE
4 series · 24 of 24 positions shown · non-contrast
Comparison: none

[Series 1: wbr_s-card_st stress raw-non gated · 6.4mm · 6.40mm/px · 6 of 23 frames shown]
[frame 2/23]
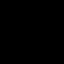
[frame 6/23]
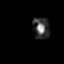
[frame 10/23]
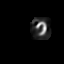
[frame 14/23]
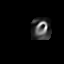
[frame 18/23]
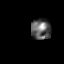
[frame 22/23]
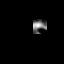

[Series 1: rest · 6.40mm/px · 6 of 64 frames shown]
[frame 6/64]
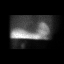
[frame 16/64]
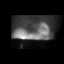
[frame 27/64]
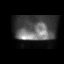
[frame 38/64]
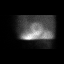
[frame 48/64]
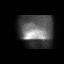
[frame 59/64]
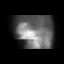

[Series 1: stress raw-non gated · 6.40mm/px · 6 of 64 frames shown]
[frame 6/64]
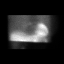
[frame 16/64]
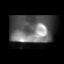
[frame 27/64]
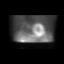
[frame 38/64]
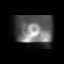
[frame 48/64]
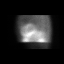
[frame 59/64]
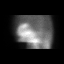

[Series 1: wbr_r-card_st rest · 6.4mm · 6.40mm/px · 6 of 23 frames shown]
[frame 2/23]
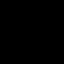
[frame 6/23]
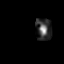
[frame 10/23]
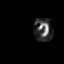
[frame 14/23]
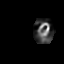
[frame 18/23]
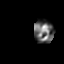
[frame 22/23]
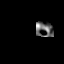

[24 of 24 positions shown; findings below may reference images not displayed]

Canned report from images found in remote index.

Refer to host system for actual result text.

## 2017-10-02 ENCOUNTER — Encounter: Payer: Self-pay | Admitting: Internal Medicine

## 2017-10-02 ENCOUNTER — Ambulatory Visit: Payer: PPO | Admitting: Internal Medicine

## 2017-10-02 VITALS — BP 132/72 | HR 59 | Ht 67.5 in | Wt 159.0 lb

## 2017-10-02 DIAGNOSIS — M2241 Chondromalacia patellae, right knee: Secondary | ICD-10-CM | POA: Diagnosis not present

## 2017-10-02 DIAGNOSIS — I10 Essential (primary) hypertension: Secondary | ICD-10-CM

## 2017-10-02 DIAGNOSIS — I480 Paroxysmal atrial fibrillation: Secondary | ICD-10-CM | POA: Diagnosis not present

## 2017-10-02 DIAGNOSIS — M2242 Chondromalacia patellae, left knee: Secondary | ICD-10-CM | POA: Diagnosis not present

## 2017-10-02 NOTE — Progress Notes (Signed)
PCP: Wenda Low, MD Primary Cardiologist: Dr Meda Coffee Primary EP: Dr Rayann Heman  Christy [name] is a 77 y.o. female who presents today for routine electrophysiology followup.  Since last being seen in our clinic, the patient reports doing reasonably well.   She has rare palpitations of unclear etiology.  These are infrequent but worrisome to her.  She reports regular rapid heart rates (110-120 bpm) with gradual termination.  Today, she denies symptoms of chest pain, shortness of breath,  lower extremity edema, dizziness, presyncope, or syncope.  The patient is otherwise without complaint today.   Past Medical History:  Diagnosis Date  . Atrial fibrillation ([place])   . Bilateral lower extremity edema    Chronic, likely related to venous stasis   . DJD (degenerative joint disease), lumbosacral   . Dyslipidemia   . Essential hypertension    "dx'd recently" (10/06/2016)  . GERD (gastroesophageal reflux disease)   . History of hepatitis C virus infection ~ 1997   In remission  . Lung granuloma ([place]) 2004    CT scan  . Osteopenia   . Paroxysmal atrial tachycardia ([place])   . PE (pulmonary embolism) 01/2016  . Spondylolisthesis    Status post epidural injections 3 by neurosurgery  - Dr. Joya Salm   . Varicose veins of both legs with edema   . Vitamin D deficiency    Past Surgical History:  Procedure Laterality Date  . ATRIAL FIBRILLATION ABLATION N/A 03/09/2017   Procedure: Atrial Fibrillation Ablation;  Surgeon: [name], MD;  Location: [place] [facility];  Service: Cardiovascular;  Laterality: N/A;  . BACK SURGERY    . CARDIOVERSION N/A 03/21/2016   Procedure: CARDIOVERSION;  Surgeon: [name], MD;  Location: [facility];  Service: Cardiovascular;  Laterality: N/A;  . COMBINED HYSTEROSCOPY DIAGNOSTIC / D&C  2005  . COMBINED HYSTEROSCOPY DIAGNOSTIC / D&C  1999  . DILATION AND CURETTAGE OF UTERUS    . [place]   "went in front and back; broken neck"  . TEE WITHOUT  CARDIOVERSION N/A 03/08/2017   Procedure: TRANSESOPHAGEAL ECHOCARDIOGRAM (TEE);  Surgeon: Josue Hector, MD;  Location[facility]COPY;  Service: Cardiovascular;  Laterality: N/A;  . TOE SURGERY Right    replaced joint  . TONSILLECTOMY    . TUBAL LIGATION Bilateral 1976  . VAGINAL DELIVERY     x2    ROS- all systems are reviewed and negatives except as per HPI above  Current Outpatient Medications  Medication Sig Dispense Refill  . apixaban (ELIQUIS) 5 MG TABS tablet TAKE 1 TABLET BY MOUTH TWICE DAILY(START 02/03/2016) 180 tablet 3  . dofetilide (TIKOSYN) 250 MCG capsule Take 1 capsule (250 mcg total) by mouth 2 (two) times daily. 180 capsule 3  . fluticasone (FLONASE) 50 MCG/ACT nasal spray Place 1 spray into both nostrils daily.    . furosemide (LASIX) 20 MG tablet Take 1 tablet (20 mg total) by mouth daily as needed for fluid or edema (weight gain of 3 lbs in 24 hrs or 5 lbs in 1 week). 30 tablet 3  . loratadine (CLARITIN) 10 MG tablet Take 10 mg by mouth daily.    . Magnesium 200 MG TABS Take 1 tablet (200 mg total) by mouth daily. 30 each   . pantoprazole (PROTONIX) 40 MG tablet Take 40 mg by mouth daily.    . propranolol (INDERAL) 20 MG tablet Take 1 tablet (20 mg total) by mouth 2 (two) times daily. 180 tablet 3   No  current facility-administered medications for this visit.     Physical Exam: Vitals:   10/02/17 1214  BP: 132/72  Pulse: (!) 59  Weight: 159 lb (72.1 kg)  Height: 5' 7.5" (1.715 m)    GEN- The patient is well appearing, alert and oriented x 3 today.   Head- normocephalic, atraumatic Eyes-  Sclera clear, conjunctiva pink Ears- hearing intact Oropharynx- clear Lungs- Clear to ausculation bilaterally, normal work of breathing Heart- Regular rate and rhythm, no murmurs, rubs or gallops, PMI not laterally displaced GI- soft, NT, ND, + BS Extremities- no clubbing, cyanosis, or edema  EKG tracing ordered today is personally reviewed and shows sinus rhythm 59  bpm, PR 252 msec, QTc 439 msec, otherwise normal ekg  Assessment and Plan:  1. Paroxysmal atrial fibrillation Doing well s/p ablation She has palpitations of unclear significance.  History is more suggestive of sinus tachy than afib.  I would like to further characterize her symptoms with monitoring.  Given infrequent nature, I would advise long term monitoring with an implantable loop recorder.  Risks and benefits to ILR were discussed at length with patient and spouse who wish to proceed.  We will proceed at the next available time. continue tikosyn chads2vasc score is 3.  On anticoagulation  2. HTN Stable No change required today  Return in 3 months  [name] MD, [facility] 10/02/2017 1:00 PM

## 2017-10-02 NOTE — H&P (View-Only) (Signed)
PCP: Wenda Low, MD Primary Cardiologist: Dr Meda Coffee Primary EP: Dr Rayann Heman  Christy [name] is a 77 y.o. female who presents today for routine electrophysiology followup.  Since last being seen in our clinic, the patient reports doing reasonably well.   She has rare palpitations of unclear etiology.  These are infrequent but worrisome to her.  She reports regular rapid heart rates (110-120 bpm) with gradual termination.  Today, she denies symptoms of chest pain, shortness of breath,  lower extremity edema, dizziness, presyncope, or syncope.  The patient is otherwise without complaint today.   Past Medical History:  Diagnosis Date  . Atrial fibrillation ([place])   . Bilateral lower extremity edema    Chronic, likely related to venous stasis   . DJD (degenerative joint disease), lumbosacral   . Dyslipidemia   . Essential hypertension    "dx'd recently" (10/06/2016)  . GERD (gastroesophageal reflux disease)   . History of hepatitis C virus infection ~ 1997   In remission  . Lung granuloma ([place]) 2004    CT scan  . Osteopenia   . Paroxysmal atrial tachycardia ([place])   . PE (pulmonary embolism) 01/2016  . Spondylolisthesis    Status post epidural injections 3 by neurosurgery  - Dr. Joya Salm   . Varicose veins of both legs with edema   . Vitamin D deficiency    Past Surgical History:  Procedure Laterality Date  . ATRIAL FIBRILLATION ABLATION N/A 03/09/2017   Procedure: Atrial Fibrillation Ablation;  Surgeon: [name], MD;  Location: [place] [facility];  Service: Cardiovascular;  Laterality: N/A;  . BACK SURGERY    . CARDIOVERSION N/A 03/21/2016   Procedure: CARDIOVERSION;  Surgeon: [name], MD;  Location: [place];  Service: Cardiovascular;  Laterality: N/A;  . COMBINED HYSTEROSCOPY DIAGNOSTIC / D&C  2005  . COMBINED HYSTEROSCOPY DIAGNOSTIC / D&C  1999  . DILATION AND CURETTAGE OF UTERUS    . [place]   "went in front and back; broken neck"  . TEE WITHOUT  CARDIOVERSION N/A 03/08/2017   Procedure: TRANSESOPHAGEAL ECHOCARDIOGRAM (TEE);  Surgeon: Josue Hector, MD;  Location: [facility] ENDOSCOPY;  Service: Cardiovascular;  Laterality: N/A;  . TOE SURGERY Right    replaced joint  . TONSILLECTOMY    . TUBAL LIGATION Bilateral 1976  . VAGINAL DELIVERY     x2    ROS- all systems are reviewed and negatives except as per HPI above  Current Outpatient Medications  Medication Sig Dispense Refill  . apixaban (ELIQUIS) 5 MG TABS tablet TAKE 1 TABLET BY MOUTH TWICE DAILY(START 02/03/2016) 180 tablet 3  . dofetilide (TIKOSYN) 250 MCG capsule Take 1 capsule (250 mcg total) by mouth 2 (two) times daily. 180 capsule 3  . fluticasone (FLONASE) 50 MCG/ACT nasal spray Place 1 spray into both nostrils daily.    . furosemide (LASIX) 20 MG tablet Take 1 tablet (20 mg total) by mouth daily as needed for fluid or edema (weight gain of 3 lbs in 24 hrs or 5 lbs in 1 week). 30 tablet 3  . loratadine (CLARITIN) 10 MG tablet Take 10 mg by mouth daily.    . Magnesium 200 MG TABS Take 1 tablet (200 mg total) by mouth daily. 30 each   . pantoprazole (PROTONIX) 40 MG tablet Take 40 mg by mouth daily.    . propranolol (INDERAL) 20 MG tablet Take 1 tablet (20 mg total) by mouth 2 (two) times daily. 180 tablet 3   No  current facility-administered medications for this visit.     Physical Exam: Vitals:   10/02/17 1214  BP: 132/72  Pulse: (!) 59  Weight: 159 lb (72.1 kg)  Height: 5' 7.5" (1.715 m)    GEN- The patient is well appearing, alert and oriented x 3 today.   Head- normocephalic, atraumatic Eyes-  Sclera clear, conjunctiva pink Ears- hearing intact Oropharynx- clear Lungs- Clear to ausculation bilaterally, normal work of breathing Heart- Regular rate and rhythm, no murmurs, rubs or gallops, PMI not laterally displaced GI- soft, NT, ND, + BS Extremities- no clubbing, cyanosis, or edema  EKG tracing ordered today is personally reviewed and shows sinus rhythm 59  bpm, PR 252 msec, QTc 439 msec, otherwise normal ekg  Assessment and Plan:  1. Paroxysmal atrial fibrillation Doing well s/p ablation She has palpitations of unclear significance.  History is more suggestive of sinus tachy than afib.  I would like to further characterize her symptoms with monitoring.  Given infrequent nature, I would advise long term monitoring with an implantable loop recorder.  Risks and benefits to ILR were discussed at length with patient and spouse who wish to proceed.  We will proceed at the next available time. continue tikosyn chads2vasc score is 3.  On anticoagulation  2. HTN Stable No change required today  Return in 3 months  [name] MD, [facility] 10/02/2017 1:00 PM

## 2017-10-02 NOTE — Patient Instructions (Addendum)
Medication Instructions:  Your physician recommends that you continue on your current medications as directed. Please refer to the Current Medication list given to you today.  Labwork: None ordered.  Testing/Procedures: See below for information on your loop recorder placement.  Follow-Up:  You will follow up with device clinic 10-14 days after your procedure for a wound check.  Your physician wants you to follow-up in: 3 months with Dr. Rayann Heman.      Any Other Special Instructions Will Be Listed Below (If Applicable).  Please arrive at the [facility] main entrance of [facility] hospital at:  6:30 am on Oct 31, 2017  NO special instructions. You can eat, drink, and take morning medications. You can drive yourself home.  If you need a refill on your cardiac medications before your next appointment, please call your pharmacy.    Implantable Loop Recorder Placement An implantable loop recorder is a small electronic device that is placed under the skin of your chest. It is about the size of an AA ("double A") battery. The device records the electrical activity of your heart over a long period of time. Your health care provider can download these recordings to monitor your heart. You may need an implantable loop recorder if you have periods of abnormal heart activity (arrhythmias) or unexplained fainting (syncope) caused by a heart problem. Tell a health care provider about:  Any allergies you have.  All medicines you are taking, including vitamins, herbs, eye drops, creams, and over-the-counter medicines.  Any problems you or family members have had with anesthetic medicines.  Any blood disorders you have.  Any surgeries you have had.  Any medical conditions you have.  Whether you are pregnant or may be pregnant. What are the risks? Generally, this is a safe procedure. However, as with any procedure, problems may occur, including:  Infection.  Bleeding.  Allergic  reactions to anesthetic medicines.  Damage to nerves or blood vessels.  Failure of the device to work. This could require another surgery to replace it.  What happens before the procedure?   You may have a physical exam, blood tests, and imaging tests of your heart, such as a chest X-ray.  Follow instructions from your health care provider about eating or drinking restrictions.  Ask your health care provider about: ? Changing or stopping your regular medicines. This is especially important if you are taking diabetes medicines or blood thinners. ? Taking medicines such as aspirin and ibuprofen. These medicines can thin your blood. Do not take these medicines before your procedure if your surgeon instructs you not to.  Ask your health care provider how your surgical site will be marked or identified.  You may be given antibiotic medicine to help prevent infection.  Plan to have someone take you home after the procedure.  If you will be going home right after the procedure, plan to have someone with you for 24 hours.  Do not use any tobacco products, such as cigarettes, chewing tobacco, and e-cigarettes as told by your surgeon. If you need help quitting, ask your health care provider. What happens during the procedure?  To reduce your risk of infection: ? Your health care team will wash or sanitize their hands. ? Your skin will be washed with soap.  An IV tube will be inserted into one of your veins.  You may be given an antibiotic medicine through the IV tube.  You may be given one or more of the following: ? A medicine  to help you relax (sedative). ? A medicine to numb the area (local anesthetic).  A small cut (incision) will be made on the left side of your upper chest.  A pocket will be created under your skin.  The device will be placed in the pocket.  The incision will be closed with stitches (sutures) or adhesive strips.  A bandage (dressing) will be placed over  the incision. The procedure may vary among health care providers and hospitals. What happens after the procedure?  Your blood pressure, heart rate, breathing rate, and blood oxygen level will be monitored often until the medicines you were given have worn off.  You may be able to go home on the day of your surgery. Before going home: ? Your health care provider will program your recorder. ? You will learn how to trigger your device with a handheld activator. ? You will learn how to send recordings to your health care provider. ? You will get an ID card for your device, and you will be told when to use it.  Do not drive for 24 hours if you received a sedative. This information is not intended to replace advice given to you by your health care provider. Make sure you discuss any questions you have with your health care provider. Document Released: 05/18/2015 Document Revised: 11/12/2015 Document Reviewed: 03/11/2015 Elsevier Interactive Patient Education  [employer].

## 2017-10-04 DIAGNOSIS — M25562 Pain in left knee: Secondary | ICD-10-CM | POA: Diagnosis not present

## 2017-10-04 DIAGNOSIS — M25561 Pain in right knee: Secondary | ICD-10-CM | POA: Diagnosis not present

## 2017-10-13 DIAGNOSIS — M25561 Pain in right knee: Secondary | ICD-10-CM | POA: Diagnosis not present

## 2017-10-13 DIAGNOSIS — M25562 Pain in left knee: Secondary | ICD-10-CM | POA: Diagnosis not present

## 2017-10-23 ENCOUNTER — Telehealth (HOSPITAL_COMMUNITY): Payer: Self-pay | Admitting: *Deleted

## 2017-10-23 NOTE — Telephone Encounter (Signed)
Patient called in stating she is having an increased burden of the "elevated HR" she has been intermittently experiencing. This weekend has been the longest she has had very frequent episodes. She has not tried taking any extra medications. Discussed with Roderic Palau NP -- will try taking an extra 10mg  of propranolol now. HR was 117 BP 120/85. She is asymptomatic other than "feeling her heart beat in her ears intermittently." Can repeat the 10mg  of propranolol tomorrow afternoon if heart rate continues to be elevated with evaluation on Wednesday. Pt in agreement.

## 2017-10-25 ENCOUNTER — Encounter (HOSPITAL_COMMUNITY): Payer: Self-pay | Admitting: Nurse Practitioner

## 2017-10-25 ENCOUNTER — Ambulatory Visit (HOSPITAL_COMMUNITY)
Admission: RE | Admit: 2017-10-25 | Discharge: 2017-10-25 | Disposition: A | Discharge status: Home / Self Care | Payer: PPO | Source: Ambulatory Visit | Attending: Nurse Practitioner | Admitting: Nurse Practitioner

## 2017-10-25 ENCOUNTER — Other Ambulatory Visit (HOSPITAL_COMMUNITY): Payer: Self-pay | Admitting: Nurse Practitioner

## 2017-10-25 VITALS — BP 114/68 | HR 67 | Ht 67.5 in | Wt 156.4 lb

## 2017-10-25 DIAGNOSIS — M47817 Spondylosis without myelopathy or radiculopathy, lumbosacral region: Secondary | ICD-10-CM | POA: Diagnosis not present

## 2017-10-25 DIAGNOSIS — I10 Essential (primary) hypertension: Secondary | ICD-10-CM | POA: Insufficient documentation

## 2017-10-25 DIAGNOSIS — K219 Gastro-esophageal reflux disease without esophagitis: Secondary | ICD-10-CM | POA: Insufficient documentation

## 2017-10-25 DIAGNOSIS — Z86711 Personal history of pulmonary embolism: Secondary | ICD-10-CM | POA: Insufficient documentation

## 2017-10-25 DIAGNOSIS — M8580 Other specified disorders of bone density and structure, unspecified site: Secondary | ICD-10-CM | POA: Diagnosis not present

## 2017-10-25 DIAGNOSIS — Z7901 Long term (current) use of anticoagulants: Secondary | ICD-10-CM | POA: Diagnosis not present

## 2017-10-25 DIAGNOSIS — I480 Paroxysmal atrial fibrillation: Secondary | ICD-10-CM | POA: Diagnosis not present

## 2017-10-25 DIAGNOSIS — E785 Hyperlipidemia, unspecified: Secondary | ICD-10-CM | POA: Insufficient documentation

## 2017-10-25 DIAGNOSIS — Z7951 Long term (current) use of inhaled steroids: Secondary | ICD-10-CM | POA: Diagnosis not present

## 2017-10-25 DIAGNOSIS — B1920 Unspecified viral hepatitis C without hepatic coma: Secondary | ICD-10-CM | POA: Diagnosis not present

## 2017-10-25 DIAGNOSIS — E559 Vitamin D deficiency, unspecified: Secondary | ICD-10-CM | POA: Diagnosis not present

## 2017-10-25 DIAGNOSIS — Z823 Family history of stroke: Secondary | ICD-10-CM | POA: Diagnosis not present

## 2017-10-25 DIAGNOSIS — Z79899 Other long term (current) drug therapy: Secondary | ICD-10-CM | POA: Insufficient documentation

## 2017-10-25 LAB — BASIC METABOLIC PANEL
Anion gap: 8 (ref 5–15)
BUN: 31 mg/dL — ABNORMAL HIGH (ref 6–20)
CO2: 28 mmol/L (ref 22–32)
Calcium: 9.4 mg/dL (ref 8.9–10.3)
Chloride: 103 mmol/L (ref 101–111)
Creatinine, Ser: 0.88 mg/dL (ref 0.44–1.00)
GFR calc Af Amer: >60 mL/min (ref >60)
GFR calc non Af Amer: >60 mL/min (ref >60)
Glucose, Bld: 104 mg/dL — ABNORMAL HIGH (ref 65–99)
Potassium: 4.2 mmol/L (ref 3.5–5.1)
Sodium: 139 mmol/L (ref 135–145)

## 2017-10-25 LAB — MAGNESIUM: Magnesium: 2 mg/dL (ref 1.7–2.4)

## 2017-10-25 MED ORDER — PROPRANOLOL HCL 10 MG PO TABS: ORAL_TABLET | ORAL | 1 refills | Status: DC

## 2017-10-25 NOTE — Progress Notes (Signed)
Primary Care Physician: Wenda Low, MD Referring Physician: Dr. Caprice Renshaw Christy [name] is a 77 y.o. female with a h/o atrial fib and PE/DVT 01/2016. She is has been treated with Rythmol and Tikosyn in the past. When her burden increased on Tikosyn, it was decided to pursue ablation 02/26/17.  She is in the afib clinic today  for increase in tachyarrhythmia's. She is in SR today. Scheduled for a LinQ next week with Dr. Rayann Heman. She called to the office a couple of days ago and was reporting afib. She was told to take an extra 1/2 tab of propranolol   at that time, around 2 pm. However, it was around one hour after dinner when she took her PM BB that she went back into rhythm.  Was in [place] x 3 months over the winter and was drinking a glass of wine every night and no problems with afib. Since returning she had reduced her wine intake and is having more issues. Reports a quick senstaion that she might pass out at times, had an episode in the office, auscultated HR did not change, regular. She also is bothered with hot flashes.. She continues on Tikosyn.Continues eliquis for a chadsvasc score of at least 3.  Today, she denies symptoms of palpitations, chest pain, shortness of breath, orthopnea, PND, lower extremity edema, dizziness, presyncope, syncope, or neurologic sequela. The patient is tolerating medications without difficulties and is otherwise without complaint today.   Past Medical History:  Diagnosis Date  . Atrial fibrillation ([place])   . Bilateral lower extremity edema    Chronic, likely related to venous stasis   . DJD (degenerative joint disease), lumbosacral   . Dyslipidemia   . Essential hypertension    "dx'd recently" (10/06/2016)  . GERD (gastroesophageal reflux disease)   . History of hepatitis C virus infection ~ 1997   In remission  . 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 Osteopenia   . Paroxysmal atrial tachycardia ([place])   . PE (pulmonary embolism) 01/2016  .  Spondylolisthesis    Status post epidural injections 3 by neurosurgery  - Dr. Joya Salm   . Varicose veins of both legs with edema   . Vitamin D deficiency    Past Surgical History:  Procedure Laterality Date  . ATRIAL FIBRILLATION ABLATION N/A 03/09/2017   Procedure: Atrial Fibrillation Ablation;  Surgeon: [name], MD;  Location: [place] [facility];  Service: Cardiovascular;  Laterality: N/A;  . BACK SURGERY    . CARDIOVERSION N/A 03/21/2016   Procedure: CARDIOVERSION;  Surgeon: [name], MD;  Location: [facility] [facility];  Service: Cardiovascular;  Laterality: N/A;  . COMBINED HYSTEROSCOPY DIAGNOSTIC / D&C  2005  . COMBINED HYSTEROSCOPY DIAGNOSTIC / D&C  1999  . DILATION AND CURETTAGE OF UTERUS    . [place]   "went in front and back; broken neck"  . TEE WITHOUT CARDIOVERSION N/A 03/08/2017   Procedure: TRANSESOPHAGEAL ECHOCARDIOGRAM (TEE);  Surgeon: Josue Hector, MD;  Location: [facility] ENDOSCOPY;  Service: Cardiovascular;  Laterality: N/A;  . TOE SURGERY Right    replaced joint  . TONSILLECTOMY    . TUBAL LIGATION Bilateral 1976  . VAGINAL DELIVERY     x2    Current Outpatient Medications  Medication Sig Dispense Refill  . apixaban (ELIQUIS) 5 MG TABS tablet TAKE 1 TABLET BY MOUTH TWICE DAILY(START 02/03/2016) 180 tablet 3  . dofetilide (TIKOSYN) 250 MCG capsule Take 1 capsule (250 mcg total)  by mouth 2 (two) times daily. 180 capsule 3  . fluticasone (FLONASE) 50 MCG/ACT nasal spray Place 1 spray into both nostrils daily.    Marland Kitchen loratadine (CLARITIN) 10 MG tablet Take 10 mg by mouth daily.    . Magnesium 200 MG TABS Take 1 tablet (200 mg total) by mouth daily. 30 each   . multivitamin-iron-minerals-folic acid (CENTRUM) chewable tablet Chew 1 tablet by mouth daily.    . pantoprazole (PROTONIX) 40 MG tablet Take 40 mg by mouth daily.    . propranolol (INDERAL) 20 MG tablet Take 1 tablet (20 mg total) by mouth 2 (two) times daily. 180 tablet 3  . furosemide (LASIX) 20  MG tablet Take 1 tablet (20 mg total) by mouth daily as needed for fluid or edema (weight gain of 3 lbs in 24 hrs or 5 lbs in 1 week). (Patient not taking: Reported on 10/25/2017) 30 tablet 3  . propranolol (INDERAL) 10 MG tablet 1 tablet as needed daily for breakthrough afib 15 tablet 1   No current facility-administered medications for this encounter.     Allergies  Allergen Reactions  . Epinephrine Palpitations    Social History   Socioeconomic History  . Marital status: Married    Spouse name: Not on file  . Number of children: Not on file  . Years of education: Not on file  . Highest education level: Not on file  Occupational History  . Not on file  Social Needs  . Financial resource strain: Not on file  . Food insecurity:    Worry: Not on file    Inability: Not on file  . Transportation needs:    Medical: Not on file    Non-medical: Not on file  Tobacco Use  . Smoking status: Never Smoker  . Smokeless tobacco: Never Used  Substance and Sexual Activity  . Alcohol use: Yes    Alcohol/week: 4.2 oz    Types: 7 Glasses of wine per week    Comment: 10/06/2016 "wine daily"  . Drug use: No  . Sexual activity: Yes    Birth control/protection: Other-see comments    Comment: tubal ligation  Lifestyle  . Physical activity:    Days per week: Not on file    Minutes per session: Not on file  . Stress: Not on file  Relationships  . Social connections:    Talks on phone: Not on file    Gets together: Not on file    Attends religious service: Not on file    Active member of club or organization: Not on file    Attends meetings of clubs or organizations: Not on file    Relationship status: Not on file  . Intimate partner violence:    Fear of current or ex partner: Not on file    Emotionally abused: Not on file    Physically abused: Not on file    Forced sexual activity: Not on file  Other Topics Concern  . Not on file  Social History Narrative   She is a married mother  of 1. Lives with her husband. 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 She has never smoked and drinks up to 10 glasses of wine or so week.   She usually exercises roughly 3 days a week doing yoga and plays golf. She is mostly limited by her "time constraints "    Family History  Problem Relation Age of Onset  . Heart Problems Mother   . CVA Mother   .  Heart attack Mother 7  . CVA Father   . Colon cancer Neg Hx   . Clotting disorder Neg Hx     ROS- All systems are reviewed and negative except as per the HPI above  Physical Exam: Vitals:   10/25/17 1043  BP: 114/68  Pulse: 67  Weight: 156 lb 6.4 oz (70.9 kg)  Height: 5' 7.5" (1.715 m)   Wt Readings from Last 3 Encounters:  10/25/17 156 lb 6.4 oz (70.9 kg)  10/02/17 159 lb (72.1 kg)  06/07/17 157 lb 12.8 oz (71.6 kg)    Labs: Lab Results  Component Value Date   NA 139 10/25/2017   K 4.2 10/25/2017   CL 103 10/25/2017   CO2 28 10/25/2017   GLUCOSE 104 (H) 10/25/2017   BUN 31 (H) 10/25/2017   CREATININE 0.88 10/25/2017   CALCIUM 9.4 10/25/2017   MG 2.0 10/25/2017   Lab Results  Component Value Date   INR 1.1 03/17/2016   No results found for: CHOL, HDL, LDLCALC, TRIG   GEN- The patient is well appearing, alert and oriented x 3 today.   Head- normocephalic, atraumatic Eyes-  Sclera clear, conjunctiva pink Ears- hearing intact Oropharynx- clear Neck- supple, no JVP Lymph- no cervical lymphadenopathy Lungs- Clear to ausculation bilaterally, normal work of breathing Heart- Regular rate and rhythm, no murmurs, rubs or gallops, PMI not laterally displaced GI- soft, NT, ND, + BS Extremities- no clubbing, cyanosis, or edema MS- no significant deformity or atrophy Skin- no rash or lesion Psych- euthymic mood, full affect Neuro- strength and sensation are intact  EKG-NSR with first degree AV block at 67 bpm, Pr int 230 ms, qrs int 90 ms, qtc 459 ms Echo-LV EF: 55% -   60%    Assessment and Plan: 1.  Paroxysmal atrial fibrillation S/p ablation 9/19 and  afib burden was low until she reported to Dr. Rayann Heman 4/15, intermittent episodes  LinQ pending 5/14 Explained to pt that the monitor will help to explain her symptoms Dr. Rayann Heman had mentioned if afib is increased, she may need repeat ablation Continue dofetilide 250 mcg bid Will call in 10 mg propranolol to use as needed for tachyarrhythmia's   Continue eliquis 5 mg bid with chadsvasc score of 3  Bmet/mag today, TSH last checked in December and WNL   Butch Penny C. Carroll, [facility] [address] [place], [place] [address] [phone]

## 2017-10-27 DIAGNOSIS — L821 Other seborrheic keratosis: Secondary | ICD-10-CM | POA: Diagnosis not present

## 2017-10-27 DIAGNOSIS — Z85828 Personal history of other malignant neoplasm of skin: Secondary | ICD-10-CM | POA: Diagnosis not present

## 2017-10-27 DIAGNOSIS — D692 Other nonthrombocytopenic purpura: Secondary | ICD-10-CM | POA: Diagnosis not present

## 2017-10-27 DIAGNOSIS — L814 Other melanin hyperpigmentation: Secondary | ICD-10-CM | POA: Diagnosis not present

## 2017-10-27 DIAGNOSIS — L570 Actinic keratosis: Secondary | ICD-10-CM | POA: Diagnosis not present

## 2017-10-27 DIAGNOSIS — D1801 Hemangioma of skin and subcutaneous tissue: Secondary | ICD-10-CM | POA: Diagnosis not present

## 2017-10-31 ENCOUNTER — Telehealth: Payer: Self-pay | Admitting: Cardiology

## 2017-10-31 ENCOUNTER — Encounter (HOSPITAL_COMMUNITY)
Admission: RE | Disposition: A | Discharge status: Home / Self Care | Payer: Self-pay | Source: Ambulatory Visit | Attending: Internal Medicine

## 2017-10-31 ENCOUNTER — Ambulatory Visit (HOSPITAL_COMMUNITY)
Admission: RE | Admit: 2017-10-31 | Discharge: 2017-10-31 | Disposition: A | Discharge status: Home / Self Care | Payer: PPO | Source: Ambulatory Visit | Attending: Internal Medicine | Admitting: Internal Medicine

## 2017-10-31 ENCOUNTER — Encounter (HOSPITAL_COMMUNITY): Payer: Self-pay | Admitting: Internal Medicine

## 2017-10-31 DIAGNOSIS — Z96698 Presence of other orthopedic joint implants: Secondary | ICD-10-CM | POA: Insufficient documentation

## 2017-10-31 DIAGNOSIS — E559 Vitamin D deficiency, unspecified: Secondary | ICD-10-CM | POA: Insufficient documentation

## 2017-10-31 DIAGNOSIS — Z7901 Long term (current) use of anticoagulants: Secondary | ICD-10-CM | POA: Insufficient documentation

## 2017-10-31 DIAGNOSIS — Z9851 Tubal ligation status: Secondary | ICD-10-CM | POA: Diagnosis not present

## 2017-10-31 DIAGNOSIS — J8410 Pulmonary fibrosis, unspecified: Secondary | ICD-10-CM | POA: Insufficient documentation

## 2017-10-31 DIAGNOSIS — Z86711 Personal history of pulmonary embolism: Secondary | ICD-10-CM | POA: Diagnosis not present

## 2017-10-31 DIAGNOSIS — M8580 Other specified disorders of bone density and structure, unspecified site: Secondary | ICD-10-CM | POA: Insufficient documentation

## 2017-10-31 DIAGNOSIS — K219 Gastro-esophageal reflux disease without esophagitis: Secondary | ICD-10-CM | POA: Insufficient documentation

## 2017-10-31 DIAGNOSIS — Z7951 Long term (current) use of inhaled steroids: Secondary | ICD-10-CM | POA: Diagnosis not present

## 2017-10-31 DIAGNOSIS — R600 Localized edema: Secondary | ICD-10-CM | POA: Diagnosis not present

## 2017-10-31 DIAGNOSIS — Z8619 Personal history of other infectious and parasitic diseases: Secondary | ICD-10-CM | POA: Insufficient documentation

## 2017-10-31 DIAGNOSIS — Z79899 Other long term (current) drug therapy: Secondary | ICD-10-CM | POA: Diagnosis not present

## 2017-10-31 DIAGNOSIS — I8393 Asymptomatic varicose veins of bilateral lower extremities: Secondary | ICD-10-CM | POA: Insufficient documentation

## 2017-10-31 DIAGNOSIS — R002 Palpitations: Secondary | ICD-10-CM | POA: Diagnosis not present

## 2017-10-31 DIAGNOSIS — E785 Hyperlipidemia, unspecified: Secondary | ICD-10-CM | POA: Insufficient documentation

## 2017-10-31 DIAGNOSIS — I4891 Unspecified atrial fibrillation: Secondary | ICD-10-CM | POA: Diagnosis not present

## 2017-10-31 DIAGNOSIS — Z9889 Other specified postprocedural states: Secondary | ICD-10-CM | POA: Diagnosis not present

## 2017-10-31 DIAGNOSIS — I480 Paroxysmal atrial fibrillation: Secondary | ICD-10-CM | POA: Diagnosis not present

## 2017-10-31 DIAGNOSIS — M1990 Unspecified osteoarthritis, unspecified site: Secondary | ICD-10-CM | POA: Diagnosis not present

## 2017-10-31 DIAGNOSIS — I10 Essential (primary) hypertension: Secondary | ICD-10-CM | POA: Insufficient documentation

## 2017-10-31 HISTORY — PX: LOOP RECORDER INSERTION: EP1214

## 2017-10-31 SURGERY — LOOP RECORDER INSERTION

## 2017-10-31 MED ORDER — LIDOCAINE-EPINEPHRINE 1 %-1:100000 IJ SOLN
INTRAMUSCULAR | Status: AC
  Filled 2017-10-31: qty 1

## 2017-10-31 MED ORDER — LIDOCAINE HCL (PF) 1 % IJ SOLN
INTRAMUSCULAR | Status: DC | PRN
  Administered 2017-10-31: 30 mL via SUBCUTANEOUS

## 2017-10-31 MED ORDER — LIDOCAINE HCL (PF) 1 % IJ SOLN
INTRAMUSCULAR | Status: AC
  Filled 2017-10-31: qty 30

## 2017-10-31 SURGICAL SUPPLY — 2 items
LOOP REVEAL LINQSYS (Prosthesis & Implant Heart) ×1 IMPLANT
PACK LOOP INSERTION (CUSTOM PROCEDURE TRAY) ×2 IMPLANT

## 2017-10-31 NOTE — Telephone Encounter (Signed)
Patient wants to know if she is suppose to have a symptom activator? She stated that she was implanted with the loop recorder today and when she got home she was reading the handbook and realized that she did not get one. Informed pt that I would have a Device Tech RN call her back. Pt verbalized understanding.

## 2017-10-31 NOTE — Interval H&P Note (Signed)
History and Physical Interval Note:  10/31/2017 7:21 AM  Christy [name]  has presented today for surgery, with the diagnosis of afib  The various methods of treatment have been discussed with the patient and family. After consideration of risks, benefits and other options for treatment, the patient has consented to  Procedure(s): LOOP RECORDER INSERTION (N/A) as a surgical intervention .  The patient's history has been reviewed, patient examined, no change in status, stable for surgery.  I have reviewed the patient's chart and labs.  Questions were answered to the patient's satisfaction.     [name]

## 2017-10-31 NOTE — Telephone Encounter (Signed)
Spoke with patient and explained that symptom activators are ordered by the doctor on a case by case basis. Patient verbalized understanding.

## 2017-11-03 DIAGNOSIS — M25562 Pain in left knee: Secondary | ICD-10-CM | POA: Diagnosis not present

## 2017-11-03 DIAGNOSIS — M25561 Pain in right knee: Secondary | ICD-10-CM | POA: Diagnosis not present

## 2017-11-06 DIAGNOSIS — M25562 Pain in left knee: Secondary | ICD-10-CM | POA: Diagnosis not present

## 2017-11-06 DIAGNOSIS — M25561 Pain in right knee: Secondary | ICD-10-CM | POA: Diagnosis not present

## 2017-11-08 DIAGNOSIS — M25562 Pain in left knee: Secondary | ICD-10-CM | POA: Diagnosis not present

## 2017-11-08 DIAGNOSIS — M25561 Pain in right knee: Secondary | ICD-10-CM | POA: Diagnosis not present

## 2017-11-14 ENCOUNTER — Other Ambulatory Visit: Payer: Self-pay | Admitting: Geriatric Medicine

## 2017-11-14 ENCOUNTER — Ambulatory Visit
Admission: RE | Admit: 2017-11-14 | Discharge: 2017-11-14 | Disposition: A | Discharge status: Home / Self Care | Payer: PPO | Source: Ambulatory Visit | Attending: Geriatric Medicine | Admitting: Geriatric Medicine

## 2017-11-14 DIAGNOSIS — J069 Acute upper respiratory infection, unspecified: Secondary | ICD-10-CM | POA: Diagnosis not present

## 2017-11-14 DIAGNOSIS — B9789 Other viral agents as the cause of diseases classified elsewhere: Secondary | ICD-10-CM

## 2017-11-14 DIAGNOSIS — R05 Cough: Secondary | ICD-10-CM | POA: Diagnosis not present

## 2017-11-15 ENCOUNTER — Ambulatory Visit (INDEPENDENT_AMBULATORY_CARE_PROVIDER_SITE_OTHER): Payer: Self-pay | Admitting: *Deleted

## 2017-11-15 DIAGNOSIS — I480 Paroxysmal atrial fibrillation: Secondary | ICD-10-CM

## 2017-11-15 DIAGNOSIS — I471 Supraventricular tachycardia: Secondary | ICD-10-CM

## 2017-11-15 LAB — CUP PACEART INCLINIC DEVICE CHECK
Date Time Interrogation Session: 20190529102716
Implantable Pulse Generator Implant Date: 20190514

## 2017-11-15 NOTE — Progress Notes (Signed)
LINQ wound check in clinic. Steri-strips removed, wound edges approximated without redness, swelling or drainage. Christy [name] educated about wound care.  Battery- good. R waves: 74mV. 2 inappropriate AF episodes- 2 min each, SR with ectopy. Patient given symptom activator and educated about use, she is aware that we may advise her to discontinue use once we see a few episodes. Patient educated about Carelink monitor and reports. Monthly Summary Reports and ROV with JA 02/05/18.

## 2017-11-16 DIAGNOSIS — R05 Cough: Secondary | ICD-10-CM | POA: Diagnosis not present

## 2017-11-16 DIAGNOSIS — R5383 Other fatigue: Secondary | ICD-10-CM | POA: Diagnosis not present

## 2017-11-21 DIAGNOSIS — M1812 Unilateral primary osteoarthritis of first carpometacarpal joint, left hand: Secondary | ICD-10-CM | POA: Diagnosis not present

## 2017-12-04 ENCOUNTER — Ambulatory Visit (INDEPENDENT_AMBULATORY_CARE_PROVIDER_SITE_OTHER): Payer: PPO | Admitting: *Deleted

## 2017-12-04 DIAGNOSIS — I480 Paroxysmal atrial fibrillation: Secondary | ICD-10-CM | POA: Diagnosis not present

## 2017-12-04 DIAGNOSIS — R001 Bradycardia, unspecified: Secondary | ICD-10-CM

## 2017-12-04 NOTE — Progress Notes (Signed)
Carelink Summary Report / Loop Recorder 

## 2017-12-06 DIAGNOSIS — M1812 Unilateral primary osteoarthritis of first carpometacarpal joint, left hand: Secondary | ICD-10-CM | POA: Diagnosis not present

## 2018-01-03 DIAGNOSIS — I5030 Unspecified diastolic (congestive) heart failure: Secondary | ICD-10-CM | POA: Diagnosis not present

## 2018-01-03 DIAGNOSIS — I4891 Unspecified atrial fibrillation: Secondary | ICD-10-CM | POA: Diagnosis not present

## 2018-01-03 DIAGNOSIS — E782 Mixed hyperlipidemia: Secondary | ICD-10-CM | POA: Diagnosis not present

## 2018-01-03 DIAGNOSIS — N182 Chronic kidney disease, stage 2 (mild): Secondary | ICD-10-CM | POA: Diagnosis not present

## 2018-01-04 ENCOUNTER — Ambulatory Visit (INDEPENDENT_AMBULATORY_CARE_PROVIDER_SITE_OTHER): Payer: PPO | Admitting: *Deleted

## 2018-01-04 DIAGNOSIS — I480 Paroxysmal atrial fibrillation: Secondary | ICD-10-CM

## 2018-01-05 NOTE — Progress Notes (Signed)
Carelink Summary Report / Loop Recorder 

## 2018-01-10 LAB — CUP PACEART REMOTE DEVICE CHECK
Date Time Interrogation Session: 20190616113611
Implantable Pulse Generator Implant Date: 20190514

## 2018-01-15 ENCOUNTER — Ambulatory Visit: Payer: PPO | Admitting: Internal Medicine

## 2018-01-24 DIAGNOSIS — R490 Dysphonia: Secondary | ICD-10-CM | POA: Diagnosis not present

## 2018-01-24 DIAGNOSIS — J392 Other diseases of pharynx: Secondary | ICD-10-CM | POA: Diagnosis not present

## 2018-02-05 ENCOUNTER — Encounter: Payer: Self-pay | Admitting: Internal Medicine

## 2018-02-05 ENCOUNTER — Ambulatory Visit (INDEPENDENT_AMBULATORY_CARE_PROVIDER_SITE_OTHER): Payer: PPO | Admitting: Internal Medicine

## 2018-02-05 VITALS — BP 130/74 | HR 56 | Ht 67.0 in | Wt 155.4 lb

## 2018-02-05 DIAGNOSIS — I480 Paroxysmal atrial fibrillation: Secondary | ICD-10-CM

## 2018-02-05 DIAGNOSIS — I10 Essential (primary) hypertension: Secondary | ICD-10-CM

## 2018-02-05 NOTE — Patient Instructions (Addendum)
Medication Instructions:  Your physician recommends that you continue on your current medications as directed. Please refer to the Current Medication list given to you today.  Labwork: You will get lab work in 3 months at your visit with the [place] clinic.  Testing/Procedures: None ordered.  Follow-Up: Your physician wants you to follow-up in: 3 months with Roderic Palau NP at the afib clinic.      Any Other Special Instructions Will Be Listed Below (If Applicable).  If you need a refill on your cardiac medications before your next appointment, please call your pharmacy.

## 2018-02-05 NOTE — Progress Notes (Signed)
PCP: Wenda Low, MD Primary Cardiologist: Dr Meda Coffee Primary EP: Dr Rayann Heman  Christy [name] is a 77 y.o. female who presents today for routine electrophysiology followup.  Since last being seen in our clinic, the patient reports doing very well. + rare palpitations.  Today, she denies symptoms of chest pain, shortness of breath,  lower extremity edema, dizziness, presyncope, or syncope.  The patient is otherwise without complaint today.   Past Medical History:  Diagnosis Date  . Atrial fibrillation ([place])   . Bilateral lower extremity edema    Chronic, likely related to venous stasis   . DJD (degenerative joint disease), lumbosacral   . Dyslipidemia   . Essential hypertension    "dx'd recently" (10/06/2016)  . GERD (gastroesophageal reflux disease)   . History of hepatitis C virus infection ~ 1997   In remission  . 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 Osteopenia   . Paroxysmal atrial tachycardia ([place])   . PE (pulmonary embolism) 01/2016  . Spondylolisthesis    Status post epidural injections 3 by neurosurgery  - Dr. Joya Salm   . Varicose veins of both legs with edema   . Vitamin D deficiency    Past Surgical History:  Procedure Laterality Date  . ATRIAL FIBRILLATION ABLATION N/A 03/09/2017   Procedure: Atrial Fibrillation Ablation;  Surgeon: [name], MD;  Location: [place] [facility];  Service: Cardiovascular;  Laterality: N/A;  . BACK SURGERY    . CARDIOVERSION N/A 03/21/2016   Procedure: CARDIOVERSION;  Surgeon: [name], MD;  Location: [facility];  Service: Cardiovascular;  Laterality: N/A;  . COMBINED HYSTEROSCOPY DIAGNOSTIC / D&C  2005  . COMBINED HYSTEROSCOPY DIAGNOSTIC / D&C  1999  . DILATION AND CURETTAGE OF UTERUS    . LOOP RECORDER INSERTION N/A 10/31/2017   Procedure: LOOP RECORDER INSERTION;  Surgeon: [name], MD;  Location: [facility] [facility];  Service: Cardiovascular;  Laterality: N/A;  . NECK SURGERY  1995   "went in front and back;  broken neck"  . TEE WITHOUT CARDIOVERSION N/A 03/08/2017   Procedure: TRANSESOPHAGEAL ECHOCARDIOGRAM (TEE);  Surgeon: Josue Hector, MD;  Location: [facility] ENDOSCOPY;  Service: Cardiovascular;  Laterality: N/A;  . TOE SURGERY Right    replaced joint  . TONSILLECTOMY    . TUBAL LIGATION Bilateral 1976  . VAGINAL DELIVERY     x2    ROS- all systems are reviewed and negatives except as per HPI above  Current Outpatient Medications  Medication Sig Dispense Refill  . apixaban (ELIQUIS) 5 MG TABS tablet TAKE 1 TABLET BY MOUTH TWICE DAILY(START 02/03/2016) 180 tablet 3  . azelastine (ASTELIN) 0.1 % nasal spray Place 1 spray into both nostrils 2 (two) times daily.    Marland Kitchen dofetilide (TIKOSYN) 250 MCG capsule Take 1 capsule (250 mcg total) by mouth 2 (two) times daily. 180 capsule 3  . furosemide (LASIX) 20 MG tablet Take 1 tablet (20 mg total) by mouth daily as needed for fluid or edema (weight gain of 3 lbs in 24 hrs or 5 lbs in 1 week). (Patient taking differently: Take 20 mg by mouth daily as needed for fluid or edema (weight gain of 3 lbs in 24 hrs or 5 lbs in 1 week). fluid, edema, weight gain of 3 lbs in 24 hrs or 5 lbs in 1 week) 30 tablet 3  . loratadine (CLARITIN) 10 MG tablet Take 10 mg by mouth daily.    . Magnesium 250 MG TABS  Take 250 mg by mouth daily.    . multivitamin-iron-minerals-folic acid (CENTRUM) chewable tablet Chew 1 tablet by mouth daily.    . pantoprazole (PROTONIX) 40 MG tablet Take 40 mg by mouth daily.    . propranolol (INDERAL) 10 MG tablet 1 tablet as needed daily for breakthrough afib (Patient taking differently: Take 10 mg by mouth daily as needed (for breakthrough afib). ) 15 tablet 1  . propranolol (INDERAL) 20 MG tablet Take 1 tablet (20 mg total) by mouth 2 (two) times daily. 180 tablet 3  . fluticasone (FLONASE) 50 MCG/ACT nasal spray Place 2 sprays into both nostrils daily.     No current facility-administered medications for this visit.     Physical  Exam: Vitals:   02/05/18 1027  BP: 130/74  Pulse: (!) 56  SpO2: 97%  Weight: 155 lb 6.4 oz (70.5 kg)  Height: 5\' 7"  (1.702 m)    GEN- The patient is well appearing, alert and oriented x 3 today.   Head- normocephalic, atraumatic Eyes-  Sclera clear, conjunctiva pink Ears- hearing intact Oropharynx- clear Lungs- Clear to ausculation bilaterally, normal work of breathing Heart- Regular rate and rhythm, no murmurs, rubs or gallops, PMI not laterally displaced GI- soft, NT, ND, + BS Extremities- no clubbing, cyanosis, or edema  Wt Readings from Last 3 Encounters:  02/05/18 155 lb 6.4 oz (70.5 kg)  10/31/17 155 lb (70.3 kg)  10/25/17 156 lb 6.4 oz (70.9 kg)    EKG tracing ordered today is personally reviewed and shows sinus rhythm 56 bpm, PR 258 msec, nonspecif St/T changes  Assessment and Plan:  1. Paroxysmal atrial fibrillatin Doing well post ablation Still on tikosyn Overall arrhythmia burden is 0.2% I do not see afib, just disorganized atrial activity chads2vasc score is 3.  Continue eliquis Needs bmet, mg on follow-up in AF clinic  2. HTN  Stable No change required today  Return in 3 months to the AF clinic [facility]  [name] MD, [facility] 02/05/2018 10:58 AM

## 2018-02-06 ENCOUNTER — Ambulatory Visit (INDEPENDENT_AMBULATORY_CARE_PROVIDER_SITE_OTHER): Payer: PPO | Admitting: *Deleted

## 2018-02-06 DIAGNOSIS — I480 Paroxysmal atrial fibrillation: Secondary | ICD-10-CM

## 2018-02-06 LAB — CUP PACEART INCLINIC DEVICE CHECK
Date Time Interrogation Session: 20190820124724
Implantable Pulse Generator Implant Date: 20190514

## 2018-02-07 DIAGNOSIS — R490 Dysphonia: Secondary | ICD-10-CM | POA: Insufficient documentation

## 2018-02-07 DIAGNOSIS — R0989 Other specified symptoms and signs involving the circulatory and respiratory systems: Secondary | ICD-10-CM | POA: Insufficient documentation

## 2018-02-07 DIAGNOSIS — R6889 Other general symptoms and signs: Secondary | ICD-10-CM | POA: Insufficient documentation

## 2018-02-07 DIAGNOSIS — J392 Other diseases of pharynx: Secondary | ICD-10-CM | POA: Diagnosis not present

## 2018-02-07 DIAGNOSIS — K219 Gastro-esophageal reflux disease without esophagitis: Secondary | ICD-10-CM | POA: Insufficient documentation

## 2018-02-07 NOTE — Progress Notes (Signed)
Carelink Summary Report / Loop Recorder 

## 2018-02-13 DIAGNOSIS — R300 Dysuria: Secondary | ICD-10-CM | POA: Diagnosis not present

## 2018-02-14 DIAGNOSIS — M25561 Pain in right knee: Secondary | ICD-10-CM | POA: Diagnosis not present

## 2018-02-14 DIAGNOSIS — M25562 Pain in left knee: Secondary | ICD-10-CM | POA: Diagnosis not present

## 2018-02-22 LAB — CUP PACEART REMOTE DEVICE CHECK
Date Time Interrogation Session: 20190719113628
Implantable Pulse Generator Implant Date: 20190514

## 2018-02-26 DIAGNOSIS — I4891 Unspecified atrial fibrillation: Secondary | ICD-10-CM | POA: Diagnosis not present

## 2018-02-26 DIAGNOSIS — E782 Mixed hyperlipidemia: Secondary | ICD-10-CM | POA: Diagnosis not present

## 2018-02-26 DIAGNOSIS — I5030 Unspecified diastolic (congestive) heart failure: Secondary | ICD-10-CM | POA: Diagnosis not present

## 2018-02-26 DIAGNOSIS — N182 Chronic kidney disease, stage 2 (mild): Secondary | ICD-10-CM | POA: Diagnosis not present

## 2018-03-02 ENCOUNTER — Other Ambulatory Visit: Payer: Self-pay | Admitting: Internal Medicine

## 2018-03-12 ENCOUNTER — Ambulatory Visit (INDEPENDENT_AMBULATORY_CARE_PROVIDER_SITE_OTHER): Payer: PPO | Admitting: *Deleted

## 2018-03-12 DIAGNOSIS — I480 Paroxysmal atrial fibrillation: Secondary | ICD-10-CM

## 2018-03-12 LAB — CUP PACEART REMOTE DEVICE CHECK
Date Time Interrogation Session: 20190821114159
Implantable Pulse Generator Implant Date: 20190514

## 2018-03-12 NOTE — Progress Notes (Signed)
Carelink Summary Report / Loop Recorder 

## 2018-03-19 LAB — CUP PACEART REMOTE DEVICE CHECK
Date Time Interrogation Session: 20190923141019
Implantable Pulse Generator Implant Date: 20190514

## 2018-04-16 ENCOUNTER — Ambulatory Visit (INDEPENDENT_AMBULATORY_CARE_PROVIDER_SITE_OTHER): Payer: PPO | Admitting: *Deleted

## 2018-04-16 DIAGNOSIS — I480 Paroxysmal atrial fibrillation: Secondary | ICD-10-CM

## 2018-04-16 DIAGNOSIS — R001 Bradycardia, unspecified: Secondary | ICD-10-CM

## 2018-04-16 NOTE — Progress Notes (Signed)
Carelink Summary Report / Loop Recorder 

## 2018-04-23 DIAGNOSIS — Z1231 Encounter for screening mammogram for malignant neoplasm of breast: Secondary | ICD-10-CM | POA: Diagnosis not present

## 2018-04-25 DIAGNOSIS — N762 Acute vulvitis: Secondary | ICD-10-CM | POA: Diagnosis not present

## 2018-04-25 DIAGNOSIS — Z01419 Encounter for gynecological examination (general) (routine) without abnormal findings: Secondary | ICD-10-CM | POA: Diagnosis not present

## 2018-04-25 DIAGNOSIS — Z6824 Body mass index (BMI) 24.0-24.9, adult: Secondary | ICD-10-CM | POA: Diagnosis not present

## 2018-04-25 DIAGNOSIS — N3945 Continuous leakage: Secondary | ICD-10-CM | POA: Diagnosis not present

## 2018-05-03 LAB — CUP PACEART REMOTE DEVICE CHECK
Date Time Interrogation Session: 20191026144102
Implantable Pulse Generator Implant Date: 20190514

## 2018-05-07 DIAGNOSIS — M1712 Unilateral primary osteoarthritis, left knee: Secondary | ICD-10-CM | POA: Diagnosis not present

## 2018-05-07 DIAGNOSIS — M1711 Unilateral primary osteoarthritis, right knee: Secondary | ICD-10-CM | POA: Diagnosis not present

## 2018-05-11 DIAGNOSIS — I4891 Unspecified atrial fibrillation: Secondary | ICD-10-CM | POA: Diagnosis not present

## 2018-05-11 DIAGNOSIS — N182 Chronic kidney disease, stage 2 (mild): Secondary | ICD-10-CM | POA: Diagnosis not present

## 2018-05-11 DIAGNOSIS — E782 Mixed hyperlipidemia: Secondary | ICD-10-CM | POA: Diagnosis not present

## 2018-05-11 DIAGNOSIS — I5030 Unspecified diastolic (congestive) heart failure: Secondary | ICD-10-CM | POA: Diagnosis not present

## 2018-05-14 ENCOUNTER — Ambulatory Visit (HOSPITAL_COMMUNITY): Payer: PPO | Admitting: Nurse Practitioner

## 2018-05-14 DIAGNOSIS — M1711 Unilateral primary osteoarthritis, right knee: Secondary | ICD-10-CM | POA: Diagnosis not present

## 2018-05-14 DIAGNOSIS — M1712 Unilateral primary osteoarthritis, left knee: Secondary | ICD-10-CM | POA: Diagnosis not present

## 2018-05-21 ENCOUNTER — Ambulatory Visit (INDEPENDENT_AMBULATORY_CARE_PROVIDER_SITE_OTHER): Payer: PPO

## 2018-05-21 DIAGNOSIS — I480 Paroxysmal atrial fibrillation: Secondary | ICD-10-CM | POA: Diagnosis not present

## 2018-05-21 DIAGNOSIS — M25561 Pain in right knee: Secondary | ICD-10-CM | POA: Diagnosis not present

## 2018-05-21 LAB — CUP PACEART REMOTE DEVICE CHECK
Date Time Interrogation Session: 20191128144109
Implantable Pulse Generator Implant Date: 20190514

## 2018-05-21 NOTE — Progress Notes (Signed)
Carelink Summary Report / Loop Recorder 

## 2018-05-24 ENCOUNTER — Ambulatory Visit (HOSPITAL_COMMUNITY)
Admission: RE | Admit: 2018-05-24 | Discharge: 2018-05-24 | Disposition: A | Discharge status: Home / Self Care | Payer: PPO | Source: Ambulatory Visit | Attending: Nurse Practitioner | Admitting: Nurse Practitioner

## 2018-05-24 ENCOUNTER — Encounter (HOSPITAL_COMMUNITY): Payer: Self-pay | Admitting: Nurse Practitioner

## 2018-05-24 VITALS — BP 132/76 | HR 62 | Ht 67.0 in | Wt 154.0 lb

## 2018-05-24 DIAGNOSIS — J8410 Pulmonary fibrosis, unspecified: Secondary | ICD-10-CM | POA: Insufficient documentation

## 2018-05-24 DIAGNOSIS — Z7901 Long term (current) use of anticoagulants: Secondary | ICD-10-CM | POA: Insufficient documentation

## 2018-05-24 DIAGNOSIS — K219 Gastro-esophageal reflux disease without esophagitis: Secondary | ICD-10-CM | POA: Diagnosis not present

## 2018-05-24 DIAGNOSIS — Z79899 Other long term (current) drug therapy: Secondary | ICD-10-CM | POA: Insufficient documentation

## 2018-05-24 DIAGNOSIS — M8580 Other specified disorders of bone density and structure, unspecified site: Secondary | ICD-10-CM | POA: Insufficient documentation

## 2018-05-24 DIAGNOSIS — Z86711 Personal history of pulmonary embolism: Secondary | ICD-10-CM | POA: Insufficient documentation

## 2018-05-24 DIAGNOSIS — I480 Paroxysmal atrial fibrillation: Secondary | ICD-10-CM | POA: Insufficient documentation

## 2018-05-24 DIAGNOSIS — Z8249 Family history of ischemic heart disease and other diseases of the circulatory system: Secondary | ICD-10-CM | POA: Diagnosis not present

## 2018-05-24 DIAGNOSIS — E559 Vitamin D deficiency, unspecified: Secondary | ICD-10-CM | POA: Diagnosis not present

## 2018-05-24 DIAGNOSIS — M47897 Other spondylosis, lumbosacral region: Secondary | ICD-10-CM | POA: Insufficient documentation

## 2018-05-24 DIAGNOSIS — I10 Essential (primary) hypertension: Secondary | ICD-10-CM | POA: Insufficient documentation

## 2018-05-24 DIAGNOSIS — E785 Hyperlipidemia, unspecified: Secondary | ICD-10-CM | POA: Diagnosis not present

## 2018-05-24 LAB — BASIC METABOLIC PANEL
Anion gap: 8 (ref 5–15)
BUN: 23 mg/dL (ref 8–23)
CO2: 28 mmol/L (ref 22–32)
Calcium: 9.5 mg/dL (ref 8.9–10.3)
Chloride: 105 mmol/L (ref 98–111)
Creatinine, Ser: 0.93 mg/dL (ref 0.44–1.00)
GFR calc Af Amer: >60 mL/min (ref >60)
GFR calc non Af Amer: 58 mL/min — ABNORMAL LOW (ref >60)
Glucose, Bld: 100 mg/dL — ABNORMAL HIGH (ref 70–99)
Potassium: 4.2 mmol/L (ref 3.5–5.1)
Sodium: 141 mmol/L (ref 135–145)

## 2018-05-24 LAB — MAGNESIUM: Magnesium: 2 mg/dL (ref 1.7–2.4)

## 2018-05-24 NOTE — Progress Notes (Signed)
Primary Care Physician: Wenda Low, MD Referring Physician: Dr. Caprice Renshaw Christy [name] is a 77 y.o. female with a h/o atrial fib and PE/DVT 01/2016. She is has been treated with Rythmol and Tikosyn in the past. When her burden increased on Tikosyn, it was decided to pursue ablation 02/26/17.  She is in the afib clinic today for f/u of Tikosyn past ablation. She feels good. She has been staying in [place]. She has a linq, has intermittent palpitations but per Dr. Jackalyn Lombard note disorganized atrial activity, but not afib. She is leaving for [place] for the winter soon, will be back the end of March.  Today, she denies symptoms of palpitations, chest pain, shortness of breath, orthopnea, PND, lower extremity edema, dizziness, presyncope, syncope, or neurologic sequela. The patient is tolerating medications without difficulties and is otherwise without complaint today.   Past Medical History:  Diagnosis Date  . Atrial fibrillation ([place])   . Bilateral lower extremity edema    Chronic, likely related to venous stasis   . DJD (degenerative joint disease), lumbosacral   . Dyslipidemia   . Essential hypertension    "dx'd recently" (10/06/2016)  . GERD (gastroesophageal reflux disease)   . History of hepatitis C virus infection ~ 1997   In remission  . 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 Osteopenia   . Paroxysmal atrial tachycardia ([place])   . PE (pulmonary embolism) 01/2016  . Spondylolisthesis    Status post epidural injections 3 by neurosurgery  - Dr. Joya Salm   . Varicose veins of both legs with edema   . Vitamin D deficiency    Past Surgical History:  Procedure Laterality Date  . ATRIAL FIBRILLATION ABLATION N/A 03/09/2017   Procedure: Atrial Fibrillation Ablation;  Surgeon: [name], MD;  Location: [facility];  Service: Cardiovascular;  Laterality: N/A;  . BACK SURGERY    . CARDIOVERSION N/A 03/21/2016   Procedure: CARDIOVERSION;  Surgeon: [name], MD;   Location: [facility];  Service: Cardiovascular;  Laterality: N/A;  . COMBINED HYSTEROSCOPY DIAGNOSTIC / D&C  2005  . COMBINED HYSTEROSCOPY DIAGNOSTIC / D&C  1999  . DILATION AND CURETTAGE OF UTERUS    . LOOP RECORDER INSERTION N/A 10/31/2017   Procedure: LOOP RECORDER INSERTION;  Surgeon: [name], MD;  Location: [facility] [facility];  Service: Cardiovascular;  Laterality: N/A;  . NECK SURGERY  1995   "went in front and back; broken neck"  . TEE WITHOUT CARDIOVERSION N/A 03/08/2017   Procedure: TRANSESOPHAGEAL ECHOCARDIOGRAM (TEE);  Surgeon: Josue Hector, MD;  Location: [facility] ENDOSCOPY;  Service: Cardiovascular;  Laterality: N/A;  . TOE SURGERY Right    replaced joint  . TONSILLECTOMY    . TUBAL LIGATION Bilateral 1976  . VAGINAL DELIVERY     x2    Current Outpatient Medications  Medication Sig Dispense Refill  . apixaban (ELIQUIS) 5 MG TABS tablet TAKE 1 TABLET BY MOUTH TWICE DAILY(START 02/03/2016) 180 tablet 3  . dofetilide (TIKOSYN) 250 MCG capsule Take 1 capsule (250 mcg total) by mouth 2 (two) times daily. 180 capsule 3  . furosemide (LASIX) 20 MG tablet Take 1 tablet (20 mg total) by mouth daily as needed for fluid or edema (weight gain of 3 lbs in 24 hrs or 5 lbs in 1 week). (Patient taking differently: Take 20 mg by mouth daily as needed for fluid or edema (weight gain of 3 lbs in 24 hrs or 5 lbs in 1  week). fluid, edema, weight gain of 3 lbs in 24 hrs or 5 lbs in 1 week) 30 tablet 3  . loratadine (CLARITIN) 10 MG tablet Take 10 mg by mouth daily.    . Magnesium 250 MG TABS Take 250 mg by mouth daily.    . multivitamin-iron-minerals-folic acid (CENTRUM) chewable tablet Chew 1 tablet by mouth daily.    . pantoprazole (PROTONIX) 40 MG tablet Take 40 mg by mouth daily.    . Probiotic Product (PROBIOTIC-10 PO) Take by mouth.    . propranolol (INDERAL) 10 MG tablet 1 tablet as needed daily for breakthrough afib (Patient taking differently: Take 10 mg by mouth daily as needed (for  breakthrough afib). ) 15 tablet 1  . propranolol (INDERAL) 20 MG tablet Take 1 tablet (20 mg total) by mouth 2 (two) times daily. 180 tablet 3   No current facility-administered medications for this encounter.     Allergies  Allergen Reactions  . Epinephrine Palpitations    Social History   Socioeconomic History  . Marital status: Married    Spouse name: Not on file  . Number of children: Not on file  . Years of education: Not on file  . Highest education level: Not on file  Occupational History  . Not on file  Social Needs  . Financial resource strain: Not on file  . Food insecurity:    Worry: Not on file    Inability: Not on file  . Transportation needs:    Medical: Not on file    Non-medical: Not on file  Tobacco Use  . Smoking status: Never Smoker  . Smokeless tobacco: Never Used  Substance and Sexual Activity  . Alcohol use: Yes    Alcohol/week: 7.0 standard drinks    Types: 7 Glasses of wine per week    Comment: 10/06/2016 "wine daily"  . Drug use: No  . Sexual activity: Yes    Birth control/protection: Other-see comments    Comment: tubal ligation  Lifestyle  . Physical activity:    Days per week: Not on file    Minutes per session: Not on file  . Stress: Not on file  Relationships  . Social connections:    Talks on phone: Not on file    Gets together: Not on file    Attends religious service: Not on file    Active member of club or organization: Not on file    Attends meetings of clubs or organizations: Not on file    Relationship status: Not on file  . Intimate partner violence:    Fear of current or ex partner: Not on file    Emotionally abused: Not on file    Physically abused: Not on file    Forced sexual activity: Not on file  Other Topics Concern  . Not on file  Social History Narrative   She is a married mother of 1. Lives with her husband. 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 She has never smoked and drinks up to 10 glasses of  wine or so week.   She usually exercises roughly 3 days a week doing yoga and plays golf. She is mostly limited by her "time constraints "    Family History  Problem Relation Age of Onset  . Heart Problems Mother   . CVA Mother   . Heart attack Mother 33  . CVA Father   . Colon cancer Neg Hx   . Clotting disorder Neg Hx  ROS- All systems are reviewed and negative except as per the HPI above  Physical Exam: Vitals:   05/24/18 0854  BP: 132/76  Pulse: 62  Weight: 69.9 kg  Height: 5\' 7"  (1.702 m)   Wt Readings from Last 3 Encounters:  05/24/18 69.9 kg  02/05/18 70.5 kg  10/31/17 70.3 kg    Labs: Lab Results  Component Value Date   NA 139 10/25/2017   K 4.2 10/25/2017   CL 103 10/25/2017   CO2 28 10/25/2017   GLUCOSE 104 (H) 10/25/2017   BUN 31 (H) 10/25/2017   CREATININE 0.88 10/25/2017   CALCIUM 9.4 10/25/2017   MG 2.0 10/25/2017   Lab Results  Component Value Date   INR 1.1 03/17/2016   No results found for: CHOL, HDL, LDLCALC, TRIG   GEN- The patient is well appearing, alert and oriented x 3 today.   Head- normocephalic, atraumatic Eyes-  Sclera clear, conjunctiva pink Ears- hearing intact Oropharynx- clear Neck- supple, no JVP Lymph- no cervical lymphadenopathy Lungs- Clear to ausculation bilaterally, normal work of breathing Heart- Regular rate and rhythm, no murmurs, rubs or gallops, PMI not laterally displaced GI- soft, NT, ND, + BS Extremities- no clubbing, cyanosis, or edema MS- no significant deformity or atrophy Skin- no rash or lesion Psych- euthymic mood, full affect Neuro- strength and sensation are intact  EKG-NSR with first degree AV block at 62 bpm, Pr int 268 ms, qrs int 72 ms, qtc 460 ms Echo-LV EF: 55% -   60%    Assessment and Plan: 1. Paroxysmal atrial fibrillation S/p ablation 02/2017  Staying in [place] per Dr. Rayann Heman Continue dofetilide 250 mcg bid/BB without change Continue eliquis 5 mg bid with chadsvasc score of  3  Bmet/mag today  F/u in early April on return of [place]. Judyth Demarais, [facility] [address] [place], [place] [address] [phone]

## 2018-05-29 ENCOUNTER — Telehealth: Payer: Self-pay

## 2018-05-29 NOTE — Telephone Encounter (Signed)
LMOVM reminding pt to send remote transmission.   

## 2018-05-29 NOTE — Telephone Encounter (Signed)
Manual transmission received and reviewed. Arrhythmia burden 0.1%, all episodes <59min duration, some disorganized atrial activity noted.

## 2018-06-04 DIAGNOSIS — Z1389 Encounter for screening for other disorder: Secondary | ICD-10-CM | POA: Diagnosis not present

## 2018-06-04 DIAGNOSIS — I5030 Unspecified diastolic (congestive) heart failure: Secondary | ICD-10-CM | POA: Diagnosis not present

## 2018-06-04 DIAGNOSIS — Z0000 Encounter for general adult medical examination without abnormal findings: Secondary | ICD-10-CM | POA: Diagnosis not present

## 2018-06-04 DIAGNOSIS — N182 Chronic kidney disease, stage 2 (mild): Secondary | ICD-10-CM | POA: Diagnosis not present

## 2018-06-04 DIAGNOSIS — E782 Mixed hyperlipidemia: Secondary | ICD-10-CM | POA: Diagnosis not present

## 2018-06-04 DIAGNOSIS — I82402 Acute embolism and thrombosis of unspecified deep veins of left lower extremity: Secondary | ICD-10-CM | POA: Diagnosis not present

## 2018-06-04 DIAGNOSIS — K219 Gastro-esophageal reflux disease without esophagitis: Secondary | ICD-10-CM | POA: Diagnosis not present

## 2018-06-04 DIAGNOSIS — I2699 Other pulmonary embolism without acute cor pulmonale: Secondary | ICD-10-CM | POA: Diagnosis not present

## 2018-06-04 DIAGNOSIS — R7309 Other abnormal glucose: Secondary | ICD-10-CM | POA: Diagnosis not present

## 2018-06-04 DIAGNOSIS — M8580 Other specified disorders of bone density and structure, unspecified site: Secondary | ICD-10-CM | POA: Diagnosis not present

## 2018-06-04 DIAGNOSIS — Z1211 Encounter for screening for malignant neoplasm of colon: Secondary | ICD-10-CM | POA: Diagnosis not present

## 2018-06-04 DIAGNOSIS — I4891 Unspecified atrial fibrillation: Secondary | ICD-10-CM | POA: Diagnosis not present

## 2018-06-11 DIAGNOSIS — D225 Melanocytic nevi of trunk: Secondary | ICD-10-CM | POA: Diagnosis not present

## 2018-06-11 DIAGNOSIS — L570 Actinic keratosis: Secondary | ICD-10-CM | POA: Diagnosis not present

## 2018-06-11 DIAGNOSIS — L814 Other melanin hyperpigmentation: Secondary | ICD-10-CM | POA: Diagnosis not present

## 2018-06-11 DIAGNOSIS — L821 Other seborrheic keratosis: Secondary | ICD-10-CM | POA: Diagnosis not present

## 2018-06-11 DIAGNOSIS — D1801 Hemangioma of skin and subcutaneous tissue: Secondary | ICD-10-CM | POA: Diagnosis not present

## 2018-06-11 DIAGNOSIS — Z85828 Personal history of other malignant neoplasm of skin: Secondary | ICD-10-CM | POA: Diagnosis not present

## 2018-06-12 ENCOUNTER — Other Ambulatory Visit: Payer: Self-pay | Admitting: Internal Medicine

## 2018-06-19 ENCOUNTER — Ambulatory Visit (INDEPENDENT_AMBULATORY_CARE_PROVIDER_SITE_OTHER): Payer: PPO

## 2018-06-19 DIAGNOSIS — I480 Paroxysmal atrial fibrillation: Secondary | ICD-10-CM | POA: Diagnosis not present

## 2018-06-19 LAB — CUP PACEART REMOTE DEVICE CHECK
Date Time Interrogation Session: 20191231121303
Implantable Pulse Generator Implant Date: 20190514

## 2018-06-21 NOTE — Progress Notes (Signed)
Carelink Summary Report / Loop Recorder 

## 2018-07-10 ENCOUNTER — Telehealth: Payer: Self-pay | Admitting: Internal Medicine

## 2018-07-10 NOTE — Telephone Encounter (Signed)
Spoke to patient who called because she pulled a muscle in her lower back and was wondering what pain medication she could take for this since she is on Eliquis.   I recommended Tylenol, she has tried this, but hasn't had any relief.  I told her that she could contact her PCP and see if any other pain relief could be prescribed.  I hinted at warm compresses and relaxation.  She verbalized understanding.

## 2018-07-10 NOTE — Telephone Encounter (Signed)
New Message   Pt c/o medication issue:  1. Name of Medication: Eliquis  2. How are you currently taking this medication (dosage and times per day)? TAKE 1 TABLET BY MOUTH TWICE DAILY  3. Are you having a reaction (difficulty breathing--STAT)?   4. What is your medication issue? Patient is calling because she has developed a muscle strain and wants to know what she can take for the strain. Please call.

## 2018-07-17 ENCOUNTER — Telehealth: Payer: Self-pay | Admitting: Cardiology

## 2018-07-17 NOTE — Telephone Encounter (Signed)
Reviewed manual transmission. Presenting rhythm from 11:31 suggests SR w/ectopy (see below). Most recent "AF" episode is from 1/22, ECG appears SR w/PVCs, some undersensing.  LMOVM for patient requesting call back to the DC. Direct number given.

## 2018-07-17 NOTE — Telephone Encounter (Signed)
Patient called and stated that her BP monitor is telling her that her heart is in a different rhythm. HR has been 66 BPM. Patient states that she feels ok. Instructed her to send a manual transmission w/ her home monitor. She wants to know if she is having any afib? Informed pt that Device Tech RN will review and call her back. Pt verbalized understanding.

## 2018-07-19 NOTE — Telephone Encounter (Signed)
Spoke with patient. Advised her of findings. She reports she was called earlier this week and was told she was not in AF. Reiterated that transmission shows some premature heartbeats but not true AF on that date. Patient verbalized understanding and thanked me for my call.

## 2018-07-23 ENCOUNTER — Ambulatory Visit (INDEPENDENT_AMBULATORY_CARE_PROVIDER_SITE_OTHER): Payer: PPO

## 2018-07-23 DIAGNOSIS — I480 Paroxysmal atrial fibrillation: Secondary | ICD-10-CM

## 2018-07-26 LAB — CUP PACEART REMOTE DEVICE CHECK
Date Time Interrogation Session: 20200202151020
Implantable Pulse Generator Implant Date: 20190514

## 2018-07-31 NOTE — Progress Notes (Signed)
Carelink Summary Report / Loop Recorder 

## 2018-08-24 ENCOUNTER — Ambulatory Visit (INDEPENDENT_AMBULATORY_CARE_PROVIDER_SITE_OTHER): Payer: PPO | Admitting: *Deleted

## 2018-08-24 DIAGNOSIS — I480 Paroxysmal atrial fibrillation: Secondary | ICD-10-CM | POA: Diagnosis not present

## 2018-08-25 LAB — CUP PACEART REMOTE DEVICE CHECK
Date Time Interrogation Session: 20200306151239
Implantable Pulse Generator Implant Date: 20190514

## 2018-08-27 DIAGNOSIS — I5030 Unspecified diastolic (congestive) heart failure: Secondary | ICD-10-CM | POA: Diagnosis not present

## 2018-08-27 DIAGNOSIS — I4891 Unspecified atrial fibrillation: Secondary | ICD-10-CM | POA: Diagnosis not present

## 2018-08-27 DIAGNOSIS — N182 Chronic kidney disease, stage 2 (mild): Secondary | ICD-10-CM | POA: Diagnosis not present

## 2018-08-27 DIAGNOSIS — E782 Mixed hyperlipidemia: Secondary | ICD-10-CM | POA: Diagnosis not present

## 2018-08-31 NOTE — Progress Notes (Signed)
Carelink Summary Report / Loop Recorder 

## 2018-09-26 ENCOUNTER — Other Ambulatory Visit: Payer: Self-pay

## 2018-09-26 ENCOUNTER — Ambulatory Visit (INDEPENDENT_AMBULATORY_CARE_PROVIDER_SITE_OTHER): Payer: PPO | Admitting: *Deleted

## 2018-09-26 DIAGNOSIS — R001 Bradycardia, unspecified: Secondary | ICD-10-CM

## 2018-09-26 DIAGNOSIS — I471 Supraventricular tachycardia: Secondary | ICD-10-CM

## 2018-09-26 DIAGNOSIS — I4719 Other supraventricular tachycardia: Secondary | ICD-10-CM

## 2018-09-26 LAB — CUP PACEART REMOTE DEVICE CHECK
Date Time Interrogation Session: 20200408125244
Implantable Pulse Generator Implant Date: 20190514

## 2018-10-01 ENCOUNTER — Telehealth (HOSPITAL_COMMUNITY): Payer: Self-pay | Admitting: *Deleted

## 2018-10-01 NOTE — Telephone Encounter (Signed)
LM for pt to clbk to covert upcoming appt to virtual visit due to covid.

## 2018-10-02 DIAGNOSIS — I4891 Unspecified atrial fibrillation: Secondary | ICD-10-CM | POA: Diagnosis not present

## 2018-10-02 DIAGNOSIS — M8580 Other specified disorders of bone density and structure, unspecified site: Secondary | ICD-10-CM | POA: Diagnosis not present

## 2018-10-02 DIAGNOSIS — I5030 Unspecified diastolic (congestive) heart failure: Secondary | ICD-10-CM | POA: Diagnosis not present

## 2018-10-02 DIAGNOSIS — E782 Mixed hyperlipidemia: Secondary | ICD-10-CM | POA: Diagnosis not present

## 2018-10-02 DIAGNOSIS — N182 Chronic kidney disease, stage 2 (mild): Secondary | ICD-10-CM | POA: Diagnosis not present

## 2018-10-05 ENCOUNTER — Other Ambulatory Visit: Payer: Self-pay

## 2018-10-05 ENCOUNTER — Ambulatory Visit (HOSPITAL_COMMUNITY)
Admission: RE | Admit: 2018-10-05 | Discharge: 2018-10-05 | Disposition: A | Discharge status: Home / Self Care | Payer: PPO | Source: Ambulatory Visit | Attending: Nurse Practitioner | Admitting: Nurse Practitioner

## 2018-10-05 ENCOUNTER — Encounter (HOSPITAL_COMMUNITY): Payer: Self-pay | Admitting: Nurse Practitioner

## 2018-10-05 DIAGNOSIS — I10 Essential (primary) hypertension: Secondary | ICD-10-CM | POA: Diagnosis not present

## 2018-10-05 DIAGNOSIS — I480 Paroxysmal atrial fibrillation: Secondary | ICD-10-CM | POA: Diagnosis not present

## 2018-10-05 NOTE — Progress Notes (Signed)
Carelink Summary Report / Loop Recorder 

## 2018-10-05 NOTE — Progress Notes (Signed)
Electrophysiology TeleHealth Note   Due to national recommendations of social distancing due to [place] 19, Audio/video telehealth visit is felt to be most appropriate for this patient at this time.  See MyChart message/consent below from today for patient consent regarding telehealth for the At[facility] Fibrillation Clinic.    Date:  10/05/2018   ID:  Christy [name], DOB September 21, 1940, MRN [id number]  Location: home  Provider location: [address] [place], [place] [address] Evaluation Performed: Follow up  PCP:  Wenda Low, MD  Primary Cardiologist:  Primary Electrophysiologist: Dr. Rayann Heman  ZT:IWPY   History of Present Illness: Christy [name] is a 78 y.o. female who presents via audio/video conferencing for a telehealth visit today.   She reports that she is doing well and has not noted any afib. She remains compliant with   Tikosyn and eliquus. She and her husband spend their winters in [place] but returned early in the middle of March 2/2 to Covid !9.  Today, she denies symptoms of palpitations, chest pain, shortness of breath, orthopnea, PND, lower extremity edema, claudication, dizziness, presyncope, syncope, bleeding, or neurologic sequela. The patient is tolerating medications without difficulties and is otherwise without complaint today.   she denies symptoms of cough, fevers, chills, or new SOB worrisome for COVID 19.   Atrial Fibrillation Risk Factors:  she does not have symptoms or diagnosis of sleep apnea. she does not have a history of rheumatic fever. she does have a history of alcohol use. The patient does not have a history of early familial atrial fibrillation or other arrhythmias.  she has a BMI of Body mass index is 23.96 kg/m.Marland Kitchen Filed Weights   10/05/18 1004  Weight: 69.4 kg    Past Medical History:  Diagnosis Date  . Atrial fibrillation ([place])   . Bilateral lower extremity edema    Chronic, likely related to venous stasis   . DJD (degenerative joint  disease), lumbosacral   . Dyslipidemia   . Essential hypertension    "dx'd recently" (10/06/2016)  . GERD (gastroesophageal reflux disease)   . History of hepatitis C virus infection ~ 1997   In remission  . Lung granuloma ([place]) 2004    CT scan  . Osteopenia   . Paroxysmal atrial tachycardia ([place])   . PE (pulmonary embolism) 01/2016  . Spondylolisthesis    Status post epidural injections 3 by neurosurgery  - Dr. Joya Salm   . Varicose veins of both legs with edema   . Vitamin D deficiency    Past Surgical History:  Procedure Laterality Date  . ATRIAL FIBRILLATION ABLATION N/A 03/09/2017   Procedure: Atrial Fibrillation Ablation;  Surgeon: [name], MD;  Location: [facility];  Service: Cardiovascular;  Laterality: N/A;  . BACK SURGERY    . CARDIOVERSION N/A 03/21/2016   Procedure: CARDIOVERSION;  Surgeon: [name], MD;  Location: [facility];  Service: Cardiovascular;  Laterality: N/A;  . COMBINED HYSTEROSCOPY DIAGNOSTIC / D&C  2005  . COMBINED HYSTEROSCOPY DIAGNOSTIC / D&C  1999  . DILATION AND CURETTAGE OF UTERUS    . LOOP RECORDER INSERTION N/A 10/31/2017   Procedure: LOOP RECORDER INSERTION;  Surgeon: [name], MD;  Location: [facility];  Service: Cardiovascular;  Laterality: N/A;  . NECK SURGERY  1995   "went in front and back; broken neck"  . TEE WITHOUT CARDIOVERSION N/A 03/08/2017   Procedure: TRANSESOPHAGEAL ECHOCARDIOGRAM (TEE);  Surgeon: Josue Hector, MD;  Location: [facility];  Service: Cardiovascular;  Laterality: N/A;  . TOE SURGERY Right    replaced joint  . TONSILLECTOMY    . TUBAL LIGATION Bilateral 1976  . VAGINAL DELIVERY     x2     Current Outpatient Medications  Medication Sig Dispense Refill  . apixaban (ELIQUIS) 5 MG TABS tablet TAKE 1 TABLET BY MOUTH TWICE DAILY(START 02/03/2016) 180 tablet 3  . dofetilide (TIKOSYN) 250 MCG capsule Take 1 capsule (250 mcg total) by mouth 2 (two) times daily. 180 capsule 3  .  furosemide (LASIX) 20 MG tablet Take 1 tablet (20 mg total) by mouth daily as needed for fluid or edema (weight gain of 3 lbs in 24 hrs or 5 lbs in 1 week). (Patient taking differently: Take 20 mg by mouth daily as needed for fluid or edema (weight gain of 3 lbs in 24 hrs or 5 lbs in 1 week). fluid, edema, weight gain of 3 lbs in 24 hrs or 5 lbs in 1 week) 30 tablet 3  . loratadine (CLARITIN) 10 MG tablet Take 10 mg by mouth daily.    . Magnesium 250 MG TABS Take 250 mg by mouth daily.    . multivitamin-iron-minerals-folic acid (CENTRUM) chewable tablet Chew 1 tablet by mouth daily.    . pantoprazole (PROTONIX) 40 MG tablet Take 40 mg by mouth 2 (two) times daily.     . Probiotic Product (PROBIOTIC-10 PO) Take by mouth.    . propranolol (INDERAL) 20 MG tablet Take 1 tablet (20 mg total) by mouth 2 (two) times daily. 180 tablet 3  . propranolol (INDERAL) 10 MG tablet Take 1 tablet (10 mg total) by mouth daily as needed (for breakthrough afib). (Patient not taking: Reported on 10/05/2018) 45 tablet 2   No current facility-administered medications for this encounter.     Allergies:   Epinephrine   Social History:  The patient  reports that she has never smoked. She has never used smokeless tobacco. She reports current alcohol use of about 7.0 standard drinks of alcohol per week. She reports that she does not use drugs.   Family History:  The patient's  family history includes CVA in her father and mother; Heart Problems in her mother; Heart attack (age of onset: 42) in her mother.    ROS:  Please see the history of present illness.   All other systems are personally reviewed and negative.   Exam: Well appearing, alert and conversant, regular work of breathing,  good skin color  Recent Labs: 05/24/2018: BUN 23; Creatinine, Ser 0.93; Magnesium 2.0; Potassium 4.2; Sodium 141  personally reviewed    Other studies personally reviewed: epic records are reviewed      ASSESSMENT AND PLAN:   1.  Paroxysmal atrial fibrillation Maintaining  SR on tikosyn and propafenone  Continue eliquis  5 mg bid No changes today   This patients CHA2DS2-VASc Score and unadjusted Ischemic Stroke Rate (% per year) is equal to 7.2 % stroke rate/year from a score of 5  Above score calculated as 1 point each if present [CHF, HTN, DM, Vascular=MI/PAD/Aortic Plaque, Age if 65-74, or Female] Above score calculated as 2 points each if present [Age > 75, or Stroke/TIA/TE]  2. HTN Stable BP today 136/80  COVID screen The patient does not have any symptoms that suggest any further testing/ screening at this time.  Social distancing reinforced today.  Follow-up:   2  months for ekg/labs only for tikosyn surveillance   Current medicines are reviewed at length with the patient  today.   The patient does not have concerns regarding her medicines.  The following changes were made today:  none  Labs/ tests ordered today none today  No orders of the defined types were placed in this encounter.   Patient Risk:  after full review of this patients clinical status, I feel that they are at moderate risk at this time.   Today, I have spent 10 minutes with the patient with telehealth technology discussing afib/covid  Signed, Roderic Palau NP  10/05/2018 11:34 AM  Afib [facility] [address] [place], [place] [address] [phone]   I hereby voluntarily request, consent and authorize the [facility] and its employed or contracted physicians, physician assistants, nurse practitioners or other licensed health care professionals (the Practitioner), to provide me with telemedicine health care services (the "Services") as deemed necessary by the treating Practitioner. I acknowledge and consent to receive the Services by the Practitioner via telemedicine. I understand that the telemedicine visit will involve communicating with the Practitioner through live audiovisual  communication technology and the disclosure of certain medical information by electronic transmission. I acknowledge that I have been given the opportunity to request an in-person assessment or other available alternative prior to the telemedicine visit and am voluntarily participating in the telemedicine visit.   I understand that I have the right to withhold or withdraw my consent to the use of telemedicine in the course of my care at any time, without affecting my right to future care or treatment, and that the Practitioner or I may terminate the telemedicine visit at any time. I understand that I have the right to inspect all information obtained and/or recorded in the course of the telemedicine visit and may receive copies of available information for a reasonable fee.  I understand that some of the potential risks of receiving the Services via telemedicine include:   Delay or interruption in medical evaluation due to technological equipment failure or disruption;  Information transmitted may not be sufficient (e.g. poor resolution of images) to allow for appropriate medical decision making by the Practitioner; and/or  In rare instances, security protocols could fail, causing a breach of personal health information.   Furthermore, I acknowledge that it is my responsibility to provide information about my medical history, conditions and care that is complete and accurate to the best of my ability. I acknowledge that Practitioner's advice, recommendations, and/or decision may be based on factors not within their control, such as incomplete or inaccurate data provided by me or distortions of diagnostic images or specimens that may result from electronic transmissions. I understand that the practice of medicine is not an exact science and that Practitioner makes no warranties or guarantees regarding treatment outcomes. I acknowledge that I will receive a copy of this consent concurrently upon execution via  email to the email address I last provided but may also request a printed copy by calling the office of the [facility].  I understand that my insurance will be billed for this visit.   I have read or had this consent read to me.  I understand the contents of this consent, which adequately explains the benefits and risks of the Services being provided via telemedicine.  I have been provided ample opportunity to ask questions regarding this consent and the Services and have had my questions answered to my satisfaction.  I give my informed consent for the services to be provided through the use of telemedicine in my medical  care  By participating in this telemedicine visit I agree to the above.      Marland Kitchen

## 2018-10-26 ENCOUNTER — Other Ambulatory Visit: Payer: Self-pay | Admitting: Internal Medicine

## 2018-10-29 ENCOUNTER — Other Ambulatory Visit: Payer: Self-pay

## 2018-10-29 ENCOUNTER — Ambulatory Visit (INDEPENDENT_AMBULATORY_CARE_PROVIDER_SITE_OTHER): Payer: PPO | Admitting: *Deleted

## 2018-10-29 DIAGNOSIS — I480 Paroxysmal atrial fibrillation: Secondary | ICD-10-CM

## 2018-10-29 DIAGNOSIS — R001 Bradycardia, unspecified: Secondary | ICD-10-CM

## 2018-10-29 LAB — CUP PACEART REMOTE DEVICE CHECK
Date Time Interrogation Session: 20200511164036
Implantable Pulse Generator Implant Date: 20190514

## 2018-11-07 NOTE — Progress Notes (Signed)
Carelink Summary Report / Loop Recorder 

## 2018-12-03 ENCOUNTER — Ambulatory Visit (INDEPENDENT_AMBULATORY_CARE_PROVIDER_SITE_OTHER): Payer: PPO | Admitting: *Deleted

## 2018-12-03 DIAGNOSIS — I480 Paroxysmal atrial fibrillation: Secondary | ICD-10-CM

## 2018-12-03 LAB — CUP PACEART REMOTE DEVICE CHECK
Date Time Interrogation Session: 20200613174036
Implantable Pulse Generator Implant Date: 20190514

## 2018-12-06 ENCOUNTER — Ambulatory Visit (HOSPITAL_COMMUNITY)
Admission: RE | Admit: 2018-12-06 | Discharge: 2018-12-06 | Disposition: A | Discharge status: Home / Self Care | Payer: PPO | Source: Ambulatory Visit | Attending: Nurse Practitioner | Admitting: Nurse Practitioner

## 2018-12-06 ENCOUNTER — Other Ambulatory Visit: Payer: Self-pay

## 2018-12-06 DIAGNOSIS — I4891 Unspecified atrial fibrillation: Secondary | ICD-10-CM | POA: Diagnosis not present

## 2018-12-06 LAB — MAGNESIUM: Magnesium: 2.1 mg/dL (ref 1.7–2.4)

## 2018-12-06 LAB — BASIC METABOLIC PANEL
Anion gap: 6 (ref 5–15)
BUN: 22 mg/dL (ref 8–23)
CO2: 27 mmol/L (ref 22–32)
Calcium: 9.6 mg/dL (ref 8.9–10.3)
Chloride: 105 mmol/L (ref 98–111)
Creatinine, Ser: 0.78 mg/dL (ref 0.44–1.00)
GFR calc Af Amer: >60 mL/min (ref >60)
GFR calc non Af Amer: >60 mL/min (ref >60)
Glucose, Bld: 88 mg/dL (ref 70–99)
Potassium: 4.1 mmol/L (ref 3.5–5.1)
Sodium: 138 mmol/L (ref 135–145)

## 2018-12-06 NOTE — Progress Notes (Signed)
Pt returns for ECG and labs for dofetilide monitoring. ECG shows SR HR 61, 1st degree AV block PR 248, QRS 76, QTc stable at 453. Patient feeling well with no questions or concerns. F/u in AF clinic in 4 months.

## 2018-12-07 ENCOUNTER — Encounter (HOSPITAL_COMMUNITY): Payer: PPO | Admitting: Nurse Practitioner

## 2018-12-11 NOTE — Progress Notes (Signed)
Carelink Summary Report / Loop Recorder 

## 2018-12-12 DIAGNOSIS — L814 Other melanin hyperpigmentation: Secondary | ICD-10-CM | POA: Diagnosis not present

## 2018-12-12 DIAGNOSIS — L570 Actinic keratosis: Secondary | ICD-10-CM | POA: Diagnosis not present

## 2018-12-12 DIAGNOSIS — D485 Neoplasm of uncertain behavior of skin: Secondary | ICD-10-CM | POA: Diagnosis not present

## 2018-12-12 DIAGNOSIS — D04121 Carcinoma in situ of skin of left upper eyelid, including canthus: Secondary | ICD-10-CM | POA: Diagnosis not present

## 2018-12-12 DIAGNOSIS — L720 Epidermal cyst: Secondary | ICD-10-CM | POA: Diagnosis not present

## 2018-12-12 DIAGNOSIS — L820 Inflamed seborrheic keratosis: Secondary | ICD-10-CM | POA: Diagnosis not present

## 2018-12-12 DIAGNOSIS — Z85828 Personal history of other malignant neoplasm of skin: Secondary | ICD-10-CM | POA: Diagnosis not present

## 2018-12-12 DIAGNOSIS — L821 Other seborrheic keratosis: Secondary | ICD-10-CM | POA: Diagnosis not present

## 2018-12-12 DIAGNOSIS — D1801 Hemangioma of skin and subcutaneous tissue: Secondary | ICD-10-CM | POA: Diagnosis not present

## 2018-12-24 DIAGNOSIS — M25561 Pain in right knee: Secondary | ICD-10-CM | POA: Diagnosis not present

## 2019-01-03 ENCOUNTER — Ambulatory Visit (INDEPENDENT_AMBULATORY_CARE_PROVIDER_SITE_OTHER): Payer: PPO | Admitting: *Deleted

## 2019-01-03 DIAGNOSIS — I4891 Unspecified atrial fibrillation: Secondary | ICD-10-CM

## 2019-01-03 LAB — CUP PACEART REMOTE DEVICE CHECK
Date Time Interrogation Session: 20200716172058
Implantable Pulse Generator Implant Date: 20190514

## 2019-01-09 DIAGNOSIS — I4891 Unspecified atrial fibrillation: Secondary | ICD-10-CM | POA: Diagnosis not present

## 2019-01-09 DIAGNOSIS — N182 Chronic kidney disease, stage 2 (mild): Secondary | ICD-10-CM | POA: Diagnosis not present

## 2019-01-09 DIAGNOSIS — I5030 Unspecified diastolic (congestive) heart failure: Secondary | ICD-10-CM | POA: Diagnosis not present

## 2019-01-09 DIAGNOSIS — K219 Gastro-esophageal reflux disease without esophagitis: Secondary | ICD-10-CM | POA: Diagnosis not present

## 2019-01-09 DIAGNOSIS — J309 Allergic rhinitis, unspecified: Secondary | ICD-10-CM | POA: Diagnosis not present

## 2019-01-09 DIAGNOSIS — B1920 Unspecified viral hepatitis C without hepatic coma: Secondary | ICD-10-CM | POA: Diagnosis not present

## 2019-01-09 DIAGNOSIS — I2699 Other pulmonary embolism without acute cor pulmonale: Secondary | ICD-10-CM | POA: Diagnosis not present

## 2019-01-09 DIAGNOSIS — I82402 Acute embolism and thrombosis of unspecified deep veins of left lower extremity: Secondary | ICD-10-CM | POA: Diagnosis not present

## 2019-01-10 NOTE — Progress Notes (Signed)
Carelink Summary Report / Loop Recorder 

## 2019-01-17 DIAGNOSIS — C441291 Squamous cell carcinoma of skin of left upper eyelid, including canthus: Secondary | ICD-10-CM | POA: Diagnosis not present

## 2019-01-17 DIAGNOSIS — M8580 Other specified disorders of bone density and structure, unspecified site: Secondary | ICD-10-CM | POA: Diagnosis not present

## 2019-01-17 DIAGNOSIS — E782 Mixed hyperlipidemia: Secondary | ICD-10-CM | POA: Diagnosis not present

## 2019-01-17 DIAGNOSIS — I5030 Unspecified diastolic (congestive) heart failure: Secondary | ICD-10-CM | POA: Diagnosis not present

## 2019-01-17 DIAGNOSIS — N182 Chronic kidney disease, stage 2 (mild): Secondary | ICD-10-CM | POA: Diagnosis not present

## 2019-01-17 DIAGNOSIS — I4891 Unspecified atrial fibrillation: Secondary | ICD-10-CM | POA: Diagnosis not present

## 2019-01-17 DIAGNOSIS — Z85828 Personal history of other malignant neoplasm of skin: Secondary | ICD-10-CM | POA: Diagnosis not present

## 2019-01-24 DIAGNOSIS — Z4801 Encounter for change or removal of surgical wound dressing: Secondary | ICD-10-CM | POA: Diagnosis not present

## 2019-02-05 ENCOUNTER — Ambulatory Visit (INDEPENDENT_AMBULATORY_CARE_PROVIDER_SITE_OTHER): Payer: PPO | Admitting: *Deleted

## 2019-02-05 DIAGNOSIS — I480 Paroxysmal atrial fibrillation: Secondary | ICD-10-CM | POA: Diagnosis not present

## 2019-02-05 LAB — CUP PACEART REMOTE DEVICE CHECK
Date Time Interrogation Session: 20200818172505
Implantable Pulse Generator Implant Date: 20190514

## 2019-02-13 NOTE — Progress Notes (Signed)
Carelink Summary Report / Loop Recorder 

## 2019-03-06 DIAGNOSIS — N182 Chronic kidney disease, stage 2 (mild): Secondary | ICD-10-CM | POA: Diagnosis not present

## 2019-03-06 DIAGNOSIS — I5030 Unspecified diastolic (congestive) heart failure: Secondary | ICD-10-CM | POA: Diagnosis not present

## 2019-03-06 DIAGNOSIS — I4891 Unspecified atrial fibrillation: Secondary | ICD-10-CM | POA: Diagnosis not present

## 2019-03-06 DIAGNOSIS — M8580 Other specified disorders of bone density and structure, unspecified site: Secondary | ICD-10-CM | POA: Diagnosis not present

## 2019-03-06 DIAGNOSIS — E782 Mixed hyperlipidemia: Secondary | ICD-10-CM | POA: Diagnosis not present

## 2019-03-11 ENCOUNTER — Ambulatory Visit (INDEPENDENT_AMBULATORY_CARE_PROVIDER_SITE_OTHER): Payer: PPO | Admitting: *Deleted

## 2019-03-11 DIAGNOSIS — I4891 Unspecified atrial fibrillation: Secondary | ICD-10-CM | POA: Diagnosis not present

## 2019-03-11 LAB — CUP PACEART REMOTE DEVICE CHECK
Date Time Interrogation Session: 20200920200948
Implantable Pulse Generator Implant Date: 20190514

## 2019-03-18 NOTE — Progress Notes (Signed)
Carelink Summary Report / Loop Recorder 

## 2019-04-10 ENCOUNTER — Ambulatory Visit (HOSPITAL_COMMUNITY)
Admission: RE | Admit: 2019-04-10 | Discharge: 2019-04-10 | Disposition: A | Discharge status: Home / Self Care | Payer: PPO | Source: Ambulatory Visit | Attending: Nurse Practitioner | Admitting: Nurse Practitioner

## 2019-04-10 ENCOUNTER — Other Ambulatory Visit: Payer: Self-pay

## 2019-04-10 ENCOUNTER — Encounter (HOSPITAL_COMMUNITY): Payer: Self-pay | Admitting: Nurse Practitioner

## 2019-04-10 VITALS — BP 160/108 | HR 53 | Ht 67.0 in | Wt 158.2 lb

## 2019-04-10 DIAGNOSIS — I480 Paroxysmal atrial fibrillation: Secondary | ICD-10-CM | POA: Insufficient documentation

## 2019-04-10 DIAGNOSIS — Z823 Family history of stroke: Secondary | ICD-10-CM | POA: Insufficient documentation

## 2019-04-10 DIAGNOSIS — Z86718 Personal history of other venous thrombosis and embolism: Secondary | ICD-10-CM | POA: Diagnosis not present

## 2019-04-10 DIAGNOSIS — M47817 Spondylosis without myelopathy or radiculopathy, lumbosacral region: Secondary | ICD-10-CM | POA: Insufficient documentation

## 2019-04-10 DIAGNOSIS — I10 Essential (primary) hypertension: Secondary | ICD-10-CM | POA: Diagnosis not present

## 2019-04-10 DIAGNOSIS — R001 Bradycardia, unspecified: Secondary | ICD-10-CM | POA: Diagnosis not present

## 2019-04-10 DIAGNOSIS — Z79899 Other long term (current) drug therapy: Secondary | ICD-10-CM | POA: Diagnosis not present

## 2019-04-10 DIAGNOSIS — M8580 Other specified disorders of bone density and structure, unspecified site: Secondary | ICD-10-CM | POA: Insufficient documentation

## 2019-04-10 DIAGNOSIS — Z7901 Long term (current) use of anticoagulants: Secondary | ICD-10-CM | POA: Insufficient documentation

## 2019-04-10 DIAGNOSIS — Z886 Allergy status to analgesic agent status: Secondary | ICD-10-CM | POA: Diagnosis not present

## 2019-04-10 DIAGNOSIS — K219 Gastro-esophageal reflux disease without esophagitis: Secondary | ICD-10-CM | POA: Diagnosis not present

## 2019-04-10 DIAGNOSIS — Z8249 Family history of ischemic heart disease and other diseases of the circulatory system: Secondary | ICD-10-CM | POA: Insufficient documentation

## 2019-04-10 LAB — BASIC METABOLIC PANEL
Anion gap: 8 (ref 5–15)
BUN: 30 mg/dL — ABNORMAL HIGH (ref 8–23)
CO2: 24 mmol/L (ref 22–32)
Calcium: 9.5 mg/dL (ref 8.9–10.3)
Chloride: 105 mmol/L (ref 98–111)
Creatinine, Ser: 0.77 mg/dL (ref 0.44–1.00)
GFR calc Af Amer: >60 mL/min (ref >60)
GFR calc non Af Amer: >60 mL/min (ref >60)
Glucose, Bld: 101 mg/dL — ABNORMAL HIGH (ref 70–99)
Potassium: 4.3 mmol/L (ref 3.5–5.1)
Sodium: 137 mmol/L (ref 135–145)

## 2019-04-10 LAB — MAGNESIUM: Magnesium: 2.1 mg/dL (ref 1.7–2.4)

## 2019-04-10 NOTE — Progress Notes (Addendum)
Primary Care Physician: Wenda Low, MD Referring Physician: Dr. Caprice Renshaw Christy [name] is a 78 y.o. female with a h/o atrial fib and PE/DVT 01/2016. She is has been treated with Rythmol and Tikosyn in the past. When her burden increased on Tikosyn, it was decided to pursue ablation 02/26/17.  She is in the afib clinic today, 04/10/19, for f/u of Tikosyn past ablation. She feels good. She has been staying in [place]. She has a linq, has intermittent palpitations but per Dr. Jackalyn Lombard note disorganized atrial activity, but not afib. She is leaving for [place] in Feb/March,2021 for a couple of months. Continues on eliquis 5 mg bid for a CHA2DS2VASc score of 3, no bleeding issues.  Today, she denies symptoms of palpitations, chest pain, shortness of breath, orthopnea, PND, lower extremity edema, dizziness, presyncope, syncope, or neurologic sequela. The patient is tolerating medications without difficulties and is otherwise without complaint today.   Past Medical History:  Diagnosis Date  . Atrial fibrillation ([place])   . Bilateral lower extremity edema    Chronic, likely related to venous stasis   . DJD (degenerative joint disease), lumbosacral   . Dyslipidemia   . Essential hypertension    "dx'd recently" (10/06/2016)  . GERD (gastroesophageal reflux disease)   . History of hepatitis C virus infection ~ 1997   In remission  . Lung granuloma ([place]) 2004    CT scan  . Osteopenia   . Paroxysmal atrial tachycardia ([place])   . PE (pulmonary embolism) 01/2016  . Spondylolisthesis    Status post epidural injections 3 by neurosurgery  - Dr. Joya Salm   . Varicose veins of both legs with edema   . Vitamin D deficiency    Past Surgical History:  Procedure Laterality Date  . ATRIAL FIBRILLATION ABLATION N/A 03/09/2017   Procedure: Atrial Fibrillation Ablation;  Surgeon: [name], MD;  Location: [facility];  Service: Cardiovascular;  Laterality: N/A;  . BACK SURGERY    . CARDIOVERSION N/A  03/21/2016   Procedure: CARDIOVERSION;  Surgeon: [name], MD;  Location: [facility];  Service: Cardiovascular;  Laterality: N/A;  . COMBINED HYSTEROSCOPY DIAGNOSTIC / D&C  2005  . COMBINED HYSTEROSCOPY DIAGNOSTIC / D&C  1999  . DILATION AND CURETTAGE OF UTERUS    . LOOP RECORDER INSERTION N/A 10/31/2017   Procedure: LOOP RECORDER INSERTION;  Surgeon: [name], MD;  Location: [facility];  Service: Cardiovascular;  Laterality: N/A;  . NECK SURGERY  1995   "went in front and back; broken neck"  . TEE WITHOUT CARDIOVERSION N/A 03/08/2017   Procedure: TRANSESOPHAGEAL ECHOCARDIOGRAM (TEE);  Surgeon: Josue Hector, MD;  Location: [facility];  Service: Cardiovascular;  Laterality: N/A;  . TOE SURGERY Right    replaced joint  . TONSILLECTOMY    . TUBAL LIGATION Bilateral 1976  . VAGINAL DELIVERY     x2    Current Outpatient Medications  Medication Sig Dispense Refill  . apixaban (ELIQUIS) 5 MG TABS tablet TAKE 1 TABLET BY MOUTH TWICE DAILY(START 02/03/2016) 180 tablet 3  . dofetilide (TIKOSYN) 250 MCG capsule TAKE 1 CAPSULE BY MOUTH TWICE DAILY IN THE MORNING AND DINNER FOR HEART 180 capsule 2  . fluticasone (FLONASE) 50 MCG/ACT nasal spray     . furosemide (LASIX) 20 MG tablet Take 1 tablet (20 mg total) by mouth daily as needed for fluid or edema (weight gain of 3 lbs in 24 hrs or 5 lbs in 1 week). (Patient taking differently:  Take 20 mg by mouth as needed for fluid or edema (weight gain of 3 lbs in 24 hrs or 5 lbs in 1 week). fluid, edema, weight gain of 3 lbs in 24 hrs or 5 lbs in 1 week) 30 tablet 3  . loratadine (CLARITIN) 10 MG tablet Take 10 mg by mouth daily.    . Magnesium 250 MG TABS Take 250 mg by mouth daily.    . multivitamin-iron-minerals-folic acid (CENTRUM) chewable tablet Chew 1 tablet by mouth daily.    . pantoprazole (PROTONIX) 40 MG tablet Take 40 mg by mouth 2 (two) times daily.     . propranolol (INDERAL) 10 MG tablet Take 1 tablet (10 mg total) by  mouth daily as needed (for breakthrough afib). (Patient taking differently: Take 10 mg by mouth as needed (for breakthrough afib). ) 45 tablet 2  . propranolol (INDERAL) 20 MG tablet Take 1 tablet (20 mg total) by mouth 2 (two) times daily. 180 tablet 3  . betamethasone valerate (VALISONE) 0.1 % cream APPLY TO VULVA BID PRN     No current facility-administered medications for this encounter.     Allergies  Allergen Reactions  . Caffeine     Heart palpitations  . Epinephrine Palpitations    Social History   Socioeconomic History  . Marital status: Married    Spouse name: Not on file  . Number of children: Not on file  . Years of education: Not on file  . Highest education level: Not on file  Occupational History  . Not on file  Social Needs  . Financial resource strain: Not on file  . Food insecurity    Worry: Not on file    Inability: Not on file  . Transportation needs    Medical: Not on file    Non-medical: Not on file  Tobacco Use  . Smoking status: Never Smoker  . Smokeless tobacco: Never Used  Substance and Sexual Activity  . Alcohol use: Yes    Alcohol/week: 7.0 standard drinks    Types: 7 Glasses of wine per week    Comment: 10/06/2016 "wine daily"  . Drug use: No  . Sexual activity: Yes    Birth control/protection: Other-see comments    Comment: tubal ligation  Lifestyle  . Physical activity    Days per week: Not on file    Minutes per session: Not on file  . Stress: Not on file  Relationships  . Social Herbalist on phone: Not on file    Gets together: Not on file    Attends religious service: Not on file    Active member of club or organization: Not on file    Attends meetings of clubs or organizations: Not on file    Relationship status: Not on file  . Intimate partner violence    Fear of current or ex partner: Not on file    Emotionally abused: Not on file    Physically abused: Not on file    Forced sexual activity: Not on file  Other  Topics Concern  . Not on file  Social History Narrative   She is a married mother of 1. Lives with her husband. 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 She has never smoked and drinks up to 10 glasses of wine or so week.   She usually exercises roughly 3 days a week doing yoga and plays golf. She is mostly limited by her "time constraints "  Family History  Problem Relation Age of Onset  . Heart Problems Mother   . CVA Mother   . Heart attack Mother 51  . CVA Father   . Colon cancer Neg Hx   . Clotting disorder Neg Hx     ROS- All systems are reviewed and negative except as per the HPI above  Physical Exam: Vitals:   04/10/19 1008  BP: (!) 160/108  Pulse: (!) 53  Weight: 71.8 kg  Height: 5\' 7"  (1.702 m)   Wt Readings from Last 3 Encounters:  04/10/19 71.8 kg  10/05/18 69.4 kg  05/24/18 69.9 kg    Labs: Lab Results  Component Value Date   NA 138 12/06/2018   K 4.1 12/06/2018   CL 105 12/06/2018   CO2 27 12/06/2018   GLUCOSE 88 12/06/2018   BUN 22 12/06/2018   CREATININE 0.78 12/06/2018   CALCIUM 9.6 12/06/2018   MG 2.1 12/06/2018   Lab Results  Component Value Date   INR 1.1 03/17/2016   No results found for: CHOL, HDL, LDLCALC, TRIG   GEN- The patient is well appearing, alert and oriented x 3 today.   Head- normocephalic, atraumatic Eyes-  Sclera clear, conjunctiva pink Ears- hearing intact Oropharynx- clear Neck- supple, no JVP Lymph- no cervical lymphadenopathy Lungs- Clear to ausculation bilaterally, normal work of breathing Heart- Regular rate and rhythm, no murmurs, rubs or gallops, PMI not laterally displaced GI- soft, NT, ND, + BS Extremities- no clubbing, cyanosis, or edema MS- no significant deformity or atrophy Skin- no rash or lesion Psych- euthymic mood, full affect Neuro- strength and sensation are intact  EKG-Sinus brady with first degree AV block at 53 bpm, Pr int 272 ms, qrs int 82 ms, qtc 463 ms( stable)    Assessment  and Plan: 1. Paroxysmal atrial fibrillation S/p ablation 02/2017  Staying in [place] per Dr. Rayann Heman Continue dofetilide 250 mcg bid/BB without change Continue eliquis 5 mg bid with chadsvasc score of 3  Bmet/mag today  2. HTN Elevated today on presentation Rechecked 138/78 Avoid salt  F/u in late January for Tikosyn surveillance prior to leaving for [place]. Shishir Krantz, [facility] [address] [place], [place] [address] [phone]

## 2019-04-10 NOTE — Addendum Note (Signed)
Encounter addended by: Sherran Needs, NP on: 04/10/2019 11:26 AM  Actions taken: Clinical Note Signed

## 2019-04-12 ENCOUNTER — Ambulatory Visit (INDEPENDENT_AMBULATORY_CARE_PROVIDER_SITE_OTHER): Payer: PPO | Admitting: *Deleted

## 2019-04-12 DIAGNOSIS — I4819 Other persistent atrial fibrillation: Secondary | ICD-10-CM

## 2019-04-12 DIAGNOSIS — I471 Supraventricular tachycardia: Secondary | ICD-10-CM

## 2019-04-14 LAB — CUP PACEART REMOTE DEVICE CHECK
Date Time Interrogation Session: 20201023201447
Implantable Pulse Generator Implant Date: 20190514

## 2019-04-19 NOTE — Progress Notes (Signed)
Carelink Summary Report / Loop Recorder 

## 2019-04-24 DIAGNOSIS — Z1231 Encounter for screening mammogram for malignant neoplasm of breast: Secondary | ICD-10-CM | POA: Diagnosis not present

## 2019-05-09 DIAGNOSIS — H524 Presbyopia: Secondary | ICD-10-CM | POA: Diagnosis not present

## 2019-05-09 DIAGNOSIS — H25813 Combined forms of age-related cataract, bilateral: Secondary | ICD-10-CM | POA: Diagnosis not present

## 2019-05-09 DIAGNOSIS — H52223 Regular astigmatism, bilateral: Secondary | ICD-10-CM | POA: Diagnosis not present

## 2019-05-09 DIAGNOSIS — H5203 Hypermetropia, bilateral: Secondary | ICD-10-CM | POA: Diagnosis not present

## 2019-05-10 DIAGNOSIS — M25561 Pain in right knee: Secondary | ICD-10-CM | POA: Diagnosis not present

## 2019-05-10 DIAGNOSIS — M25562 Pain in left knee: Secondary | ICD-10-CM | POA: Diagnosis not present

## 2019-05-10 DIAGNOSIS — M79672 Pain in left foot: Secondary | ICD-10-CM | POA: Diagnosis not present

## 2019-05-15 ENCOUNTER — Ambulatory Visit (INDEPENDENT_AMBULATORY_CARE_PROVIDER_SITE_OTHER): Payer: PPO | Admitting: *Deleted

## 2019-05-15 DIAGNOSIS — I480 Paroxysmal atrial fibrillation: Secondary | ICD-10-CM

## 2019-05-15 LAB — CUP PACEART REMOTE DEVICE CHECK
Date Time Interrogation Session: 20201125151631
Implantable Pulse Generator Implant Date: 20190514

## 2019-05-31 DIAGNOSIS — I5030 Unspecified diastolic (congestive) heart failure: Secondary | ICD-10-CM | POA: Diagnosis not present

## 2019-05-31 DIAGNOSIS — N182 Chronic kidney disease, stage 2 (mild): Secondary | ICD-10-CM | POA: Diagnosis not present

## 2019-05-31 DIAGNOSIS — M8580 Other specified disorders of bone density and structure, unspecified site: Secondary | ICD-10-CM | POA: Diagnosis not present

## 2019-05-31 DIAGNOSIS — I4891 Unspecified atrial fibrillation: Secondary | ICD-10-CM | POA: Diagnosis not present

## 2019-05-31 DIAGNOSIS — E782 Mixed hyperlipidemia: Secondary | ICD-10-CM | POA: Diagnosis not present

## 2019-06-06 DIAGNOSIS — I471 Supraventricular tachycardia: Secondary | ICD-10-CM | POA: Diagnosis not present

## 2019-06-06 DIAGNOSIS — E782 Mixed hyperlipidemia: Secondary | ICD-10-CM | POA: Diagnosis not present

## 2019-06-06 DIAGNOSIS — R32 Unspecified urinary incontinence: Secondary | ICD-10-CM | POA: Diagnosis not present

## 2019-06-06 DIAGNOSIS — I2699 Other pulmonary embolism without acute cor pulmonale: Secondary | ICD-10-CM | POA: Diagnosis not present

## 2019-06-06 DIAGNOSIS — Z0000 Encounter for general adult medical examination without abnormal findings: Secondary | ICD-10-CM | POA: Diagnosis not present

## 2019-06-06 DIAGNOSIS — I5030 Unspecified diastolic (congestive) heart failure: Secondary | ICD-10-CM | POA: Diagnosis not present

## 2019-06-06 DIAGNOSIS — K219 Gastro-esophageal reflux disease without esophagitis: Secondary | ICD-10-CM | POA: Diagnosis not present

## 2019-06-06 DIAGNOSIS — B1920 Unspecified viral hepatitis C without hepatic coma: Secondary | ICD-10-CM | POA: Diagnosis not present

## 2019-06-06 DIAGNOSIS — Z23 Encounter for immunization: Secondary | ICD-10-CM | POA: Diagnosis not present

## 2019-06-06 DIAGNOSIS — I4891 Unspecified atrial fibrillation: Secondary | ICD-10-CM | POA: Diagnosis not present

## 2019-06-06 DIAGNOSIS — N182 Chronic kidney disease, stage 2 (mild): Secondary | ICD-10-CM | POA: Diagnosis not present

## 2019-06-06 DIAGNOSIS — Z1389 Encounter for screening for other disorder: Secondary | ICD-10-CM | POA: Diagnosis not present

## 2019-06-11 NOTE — Progress Notes (Signed)
ILR remote 

## 2019-06-17 ENCOUNTER — Ambulatory Visit (INDEPENDENT_AMBULATORY_CARE_PROVIDER_SITE_OTHER): Payer: PPO | Admitting: *Deleted

## 2019-06-17 DIAGNOSIS — I4891 Unspecified atrial fibrillation: Secondary | ICD-10-CM | POA: Diagnosis not present

## 2019-06-17 DIAGNOSIS — I480 Paroxysmal atrial fibrillation: Secondary | ICD-10-CM

## 2019-06-24 DIAGNOSIS — Z6825 Body mass index (BMI) 25.0-25.9, adult: Secondary | ICD-10-CM | POA: Diagnosis not present

## 2019-06-24 DIAGNOSIS — N952 Postmenopausal atrophic vaginitis: Secondary | ICD-10-CM | POA: Diagnosis not present

## 2019-06-24 DIAGNOSIS — N3946 Mixed incontinence: Secondary | ICD-10-CM | POA: Diagnosis not present

## 2019-06-24 DIAGNOSIS — B1920 Unspecified viral hepatitis C without hepatic coma: Secondary | ICD-10-CM | POA: Insufficient documentation

## 2019-06-24 DIAGNOSIS — N951 Menopausal and female climacteric states: Secondary | ICD-10-CM | POA: Diagnosis not present

## 2019-06-24 DIAGNOSIS — Z01419 Encounter for gynecological examination (general) (routine) without abnormal findings: Secondary | ICD-10-CM | POA: Diagnosis not present

## 2019-06-29 DIAGNOSIS — B349 Viral infection, unspecified: Secondary | ICD-10-CM | POA: Diagnosis not present

## 2019-07-01 ENCOUNTER — Other Ambulatory Visit: Payer: Self-pay | Admitting: Nurse Practitioner

## 2019-07-01 ENCOUNTER — Other Ambulatory Visit: Payer: PPO

## 2019-07-01 DIAGNOSIS — I10 Essential (primary) hypertension: Secondary | ICD-10-CM

## 2019-07-01 DIAGNOSIS — U071 COVID-19: Secondary | ICD-10-CM

## 2019-07-01 NOTE — Progress Notes (Signed)
  I connected by phone with Christy [name] on 07/01/2019 at 11:30 AM to discuss the potential use of an new treatment for mild to moderate COVID-19 viral infection in non-hospitalized patients.  This patient is a 79 y.o. female that meets the FDA criteria for Emergency Use Authorization of bamlanivimab or casirivimab\imdevimab.  Has a (+) direct SARS-CoV-2 viral test result  Has mild or moderate COVID-19   Is ? 81 years of age and weighs ? 40 kg  Is NOT hospitalized due to COVID-19  Is NOT requiring oxygen therapy or requiring an increase in baseline oxygen flow rate due to COVID-19  Is within 10 days of symptom onset  Has at least one of the high risk factor(s) for progression to severe COVID-19 and/or hospitalization as defined in EUA.  Specific high risk criteria : Hypertension   I have spoken and communicated the following to the patient or parent/caregiver:  1. FDA has authorized the emergency use of bamlanivimab and casirivimab\imdevimab for the treatment of mild to moderate COVID-19 in adults and pediatric patients with positive results of direct SARS-CoV-2 viral testing who are 30 years of age and older weighing at least 40 kg, and who are at high risk for progressing to severe COVID-19 and/or hospitalization.  2. The significant known and potential risks and benefits of bamlanivimab and casirivimab\imdevimab, and the extent to which such potential risks and benefits are unknown.  3. Information on available alternative treatments and the risks and benefits of those alternatives, including clinical trials.  4. Patients treated with bamlanivimab and casirivimab\imdevimab should continue to self-isolate and use infection control measures (e.g., wear mask, isolate, social distance, avoid sharing personal items, clean and disinfect "high touch" surfaces, and frequent handwashing) according to CDC guidelines.   5. The patient or parent/caregiver has the option to accept or refuse  bamlanivimab or casirivimab\imdevimab .  After reviewing this information with the patient, The patient agreed to proceed with receiving the bamlanimivab infusion and will be provided a copy of the Fact sheet prior to receiving the infusion.Fenton Foy 07/01/2019 11:30 AM

## 2019-07-03 ENCOUNTER — Encounter (HOSPITAL_COMMUNITY): Payer: Self-pay

## 2019-07-03 ENCOUNTER — Ambulatory Visit (HOSPITAL_COMMUNITY)
Admission: RE | Admit: 2019-07-03 | Discharge: 2019-07-03 | Disposition: A | Discharge status: Home / Self Care | Payer: Medicare Other | Source: Ambulatory Visit | Attending: Pulmonary Disease | Admitting: Pulmonary Disease

## 2019-07-03 DIAGNOSIS — I10 Essential (primary) hypertension: Secondary | ICD-10-CM | POA: Insufficient documentation

## 2019-07-03 DIAGNOSIS — U071 COVID-19: Secondary | ICD-10-CM | POA: Diagnosis present

## 2019-07-03 DIAGNOSIS — Z23 Encounter for immunization: Secondary | ICD-10-CM | POA: Insufficient documentation

## 2019-07-03 MED ORDER — SODIUM CHLORIDE 0.9 % IV SOLN
INTRAVENOUS | Status: DC | PRN
  Administered 2019-07-03: 250 mL via INTRAVENOUS

## 2019-07-03 MED ORDER — SODIUM CHLORIDE 0.9 % IV SOLN
700.0000 mg | Freq: Once | INTRAVENOUS | Status: AC
  Administered 2019-07-03: 700 mg via INTRAVENOUS
  Filled 2019-07-03: qty 20

## 2019-07-03 MED ORDER — DIPHENHYDRAMINE HCL 50 MG/ML IJ SOLN: 50.0000 mg | Freq: Once | INTRAMUSCULAR | Status: DC | PRN

## 2019-07-03 MED ORDER — METHYLPREDNISOLONE SODIUM SUCC 125 MG IJ SOLR: 125.0000 mg | Freq: Once | INTRAMUSCULAR | Status: DC | PRN

## 2019-07-03 MED ORDER — EPINEPHRINE 0.3 MG/0.3ML IJ SOAJ: 0.3000 mg | Freq: Once | INTRAMUSCULAR | Status: DC | PRN

## 2019-07-03 MED ORDER — FAMOTIDINE IN NACL 20-0.9 MG/50ML-% IV SOLN: 20.0000 mg | Freq: Once | INTRAVENOUS | Status: DC | PRN

## 2019-07-03 MED ORDER — ALBUTEROL SULFATE HFA 108 (90 BASE) MCG/ACT IN AERS: 2.0000 | INHALATION_SPRAY | Freq: Once | RESPIRATORY_TRACT | Status: DC | PRN

## 2019-07-03 NOTE — Discharge Instructions (Signed)

## 2019-07-03 NOTE — Progress Notes (Signed)
  Diagnosis: COVID-19  Physician: Dr. Wenda Low  Procedure: Covid Infusion Clinic Med: bamlanivimab infusion - Provided patient with bamlanimivab fact sheet for patients, parents and caregivers prior to infusion.  Complications: No immediate complications noted.  Discharge: Discharged home   [name] 07/03/2019

## 2019-07-17 ENCOUNTER — Ambulatory Visit (HOSPITAL_COMMUNITY): Payer: PPO | Admitting: Nurse Practitioner

## 2019-07-17 ENCOUNTER — Other Ambulatory Visit: Payer: Self-pay

## 2019-07-17 ENCOUNTER — Encounter (HOSPITAL_COMMUNITY): Payer: Self-pay | Admitting: Nurse Practitioner

## 2019-07-17 ENCOUNTER — Ambulatory Visit (HOSPITAL_COMMUNITY)
Admission: RE | Admit: 2019-07-17 | Discharge: 2019-07-17 | Disposition: A | Discharge status: Home / Self Care | Payer: PPO | Source: Ambulatory Visit | Attending: Nurse Practitioner | Admitting: Nurse Practitioner

## 2019-07-17 VITALS — BP 124/84 | HR 63 | Ht 67.0 in | Wt 160.4 lb

## 2019-07-17 DIAGNOSIS — Z79899 Other long term (current) drug therapy: Secondary | ICD-10-CM | POA: Diagnosis not present

## 2019-07-17 DIAGNOSIS — Z7901 Long term (current) use of anticoagulants: Secondary | ICD-10-CM | POA: Diagnosis not present

## 2019-07-17 DIAGNOSIS — Z91018 Allergy to other foods: Secondary | ICD-10-CM | POA: Insufficient documentation

## 2019-07-17 DIAGNOSIS — Z8249 Family history of ischemic heart disease and other diseases of the circulatory system: Secondary | ICD-10-CM | POA: Diagnosis not present

## 2019-07-17 DIAGNOSIS — Z886 Allergy status to analgesic agent status: Secondary | ICD-10-CM | POA: Insufficient documentation

## 2019-07-17 DIAGNOSIS — K219 Gastro-esophageal reflux disease without esophagitis: Secondary | ICD-10-CM | POA: Diagnosis not present

## 2019-07-17 DIAGNOSIS — Z86718 Personal history of other venous thrombosis and embolism: Secondary | ICD-10-CM | POA: Diagnosis not present

## 2019-07-17 DIAGNOSIS — I440 Atrioventricular block, first degree: Secondary | ICD-10-CM | POA: Insufficient documentation

## 2019-07-17 DIAGNOSIS — D6869 Other thrombophilia: Secondary | ICD-10-CM

## 2019-07-17 DIAGNOSIS — Z823 Family history of stroke: Secondary | ICD-10-CM | POA: Diagnosis not present

## 2019-07-17 DIAGNOSIS — I10 Essential (primary) hypertension: Secondary | ICD-10-CM | POA: Diagnosis not present

## 2019-07-17 DIAGNOSIS — I480 Paroxysmal atrial fibrillation: Secondary | ICD-10-CM | POA: Diagnosis not present

## 2019-07-17 DIAGNOSIS — Z8616 Personal history of COVID-19: Secondary | ICD-10-CM | POA: Insufficient documentation

## 2019-07-17 DIAGNOSIS — I491 Atrial premature depolarization: Secondary | ICD-10-CM | POA: Insufficient documentation

## 2019-07-17 LAB — BASIC METABOLIC PANEL
Anion gap: 10 (ref 5–15)
BUN: 23 mg/dL (ref 8–23)
CO2: 26 mmol/L (ref 22–32)
Calcium: 9.4 mg/dL (ref 8.9–10.3)
Chloride: 106 mmol/L (ref 98–111)
Creatinine, Ser: 0.87 mg/dL (ref 0.44–1.00)
GFR calc Af Amer: >60 mL/min (ref >60)
GFR calc non Af Amer: >60 mL/min (ref >60)
Glucose, Bld: 91 mg/dL (ref 70–99)
Potassium: 4.8 mmol/L (ref 3.5–5.1)
Sodium: 142 mmol/L (ref 135–145)

## 2019-07-17 LAB — MAGNESIUM: Magnesium: 2 mg/dL (ref 1.7–2.4)

## 2019-07-17 NOTE — Progress Notes (Signed)
Primary Care Physician: Wenda Low, MD Referring Physician: Dr. Caprice Renshaw Christy [name] is a 79 y.o. female with a h/o atrial fib and PE/DVT 01/2016. She is has been treated with Rythmol and Tikosyn in the past. When her burden increased on Tikosyn, it was decided to pursue ablation 02/26/17.  She is in the afib clinic today, 04/10/19, for f/u of Tikosyn past ablation. She feels good. She has been staying in [place]. She has a linq, has intermittent palpitations but per Dr. Jackalyn Lombard note disorganized atrial activity, but not afib. She is leaving for [place] in Feb/March,2021 for a couple of months. Continues on eliquis 5 mg bid for a CHA2DS2VASc score of 5, no bleeding issues.  F/u in afib clinic, 07/18/19. She reports that she had Covid 19 the earlier part of the month while she and her husband were at the beach. They believe the exposure was from  2 furniture  delivery men  that were in their house for awhile, as they did not come into contact with other people and did not eat in restaurants.  She  and her husband both came down with it and received the antibody infusion at [facility] hospital. She overall felt better after that and she feels their symptoms were fairly mild. She did not have any afib while she was sick with covid. Continues on tikosyn and eliquis with a CHA2DS2VASc score of 5.  Today, she denies symptoms of palpitations, chest pain, shortness of breath, orthopnea, PND, lower extremity edema, dizziness, presyncope, syncope, or neurologic sequela. The patient is tolerating medications without difficulties and is otherwise without complaint today.   Past Medical History:  Diagnosis Date  . Atrial fibrillation ([place])   . Bilateral lower extremity edema    Chronic, likely related to venous stasis   . DJD (degenerative joint disease), lumbosacral   . Dyslipidemia   . Essential hypertension    "dx'd recently" (10/06/2016)  . GERD (gastroesophageal reflux disease)   . History of hepatitis C  virus infection ~ 1997   In remission  . 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 Osteopenia   . Paroxysmal atrial tachycardia ([place])   . PE (pulmonary embolism) 01/2016  . Spondylolisthesis    Status post epidural injections 3 by neurosurgery  - Dr. Joya Salm   . Varicose veins of both legs with edema   . Vitamin D deficiency    Past Surgical History:  Procedure Laterality Date  . ATRIAL FIBRILLATION ABLATION N/A 03/09/2017   Procedure: Atrial Fibrillation Ablation;  Surgeon: [name], MD;  Location: [place] [facility];  Service: Cardiovascular;  Laterality: N/A;  . BACK SURGERY    . CARDIOVERSION N/A 03/21/2016   Procedure: CARDIOVERSION;  Surgeon: [name], MD;  Location: [place];  Service: Cardiovascular;  Laterality: N/A;  . COMBINED HYSTEROSCOPY DIAGNOSTIC / D&C  2005  . COMBINED HYSTEROSCOPY DIAGNOSTIC / D&C  1999  . DILATION AND CURETTAGE OF UTERUS    . LOOP RECORDER INSERTION N/A 10/31/2017   Procedure: LOOP RECORDER INSERTION;  Surgeon: [name], MD;  Location: [place] [facility];  Service: Cardiovascular;  Laterality: N/A;  . NECK SURGERY  1995   "went in front and back; broken neck"  . TEE WITHOUT CARDIOVERSION N/A 03/08/2017   Procedure: TRANSESOPHAGEAL ECHOCARDIOGRAM (TEE);  Surgeon: Josue Hector, MD;  Location: [facility] ENDOSCOPY;  Service: Cardiovascular;  Laterality: N/A;  . TOE SURGERY Right    replaced joint  . TONSILLECTOMY    .  TUBAL LIGATION Bilateral 1976  . VAGINAL DELIVERY     x2    Current Outpatient Medications  Medication Sig Dispense Refill  . apixaban (ELIQUIS) 5 MG TABS tablet TAKE 1 TABLET BY MOUTH TWICE DAILY(START 02/03/2016) 180 tablet 3  . betamethasone valerate (VALISONE) 0.1 % cream APPLY TO VULVA BID PRN    . dofetilide (TIKOSYN) 250 MCG capsule TAKE 1 CAPSULE BY MOUTH TWICE DAILY IN THE MORNING AND DINNER FOR HEART 180 capsule 2  . furosemide (LASIX) 20 MG tablet Take 1 tablet (20 mg total) by mouth daily as needed for  fluid or edema (weight gain of 3 lbs in 24 hrs or 5 lbs in 1 week). (Patient taking differently: Take 20 mg by mouth as needed for fluid or edema (weight gain of 3 lbs in 24 hrs or 5 lbs in 1 week). fluid, edema, weight gain of 3 lbs in 24 hrs or 5 lbs in 1 week) 30 tablet 3  . loratadine (CLARITIN) 10 MG tablet Take 10 mg by mouth daily.    . Magnesium 250 MG TABS Take 250 mg by mouth daily.    . multivitamin-iron-minerals-folic acid (CENTRUM) chewable tablet Chew 1 tablet by mouth daily.    . pantoprazole (PROTONIX) 40 MG tablet Take 40 mg by mouth 2 (two) times daily.     . propranolol (INDERAL) 10 MG tablet Take 1 tablet (10 mg total) by mouth daily as needed (for breakthrough afib). (Patient taking differently: Take 10 mg by mouth as needed (for breakthrough afib). ) 45 tablet 2  . propranolol (INDERAL) 20 MG tablet Take 1 tablet (20 mg total) by mouth 2 (two) times daily. 180 tablet 3   No current facility-administered medications for this encounter.    Allergies  Allergen Reactions  . Caffeine     Heart palpitations  . Epinephrine Palpitations    Social History   Socioeconomic History  . Marital status: Married    Spouse name: Not on file  . Number of children: Not on file  . Years of education: Not on file  . Highest education level: Not on file  Occupational History  . Not on file  Tobacco Use  . Smoking status: Never Smoker  . Smokeless tobacco: Never Used  Substance and Sexual Activity  . Alcohol use: Yes    Alcohol/week: 14.0 standard drinks    Types: 14 Glasses of wine per week    Comment: 10/06/2016 "wine daily"  . Drug use: No  . Sexual activity: Yes    Birth control/protection: Other-see comments    Comment: tubal ligation  Other Topics Concern  . Not on file  Social History Narrative   She is a married mother of 1. Lives with her husband. 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 She has never smoked and drinks up to 10 glasses of wine or so week.    She usually exercises roughly 3 days a week doing yoga and plays golf. She is mostly limited by her "time constraints "   Social Determinants of Radio broadcast assistant Strain:   . Difficulty of Paying Living Expenses: Not on file  Food Insecurity:   . Worried About Charity fundraiser in the Last Year: Not on file  . Ran Out of Food in the Last Year: Not on file  Transportation Needs:   . Lack of Transportation (Medical): Not on file  . Lack of Transportation (Non-Medical): Not on file  Physical Activity:   .  Days of Exercise per Week: Not on file  . Minutes of Exercise per Session: Not on file  Stress:   . Feeling of Stress : Not on file  Social Connections:   . Frequency of Communication with Friends and Family: Not on file  . Frequency of Social Gatherings with Friends and Family: Not on file  . Attends Religious Services: Not on file  . Active Member of Clubs or Organizations: Not on file  . Attends Archivist Meetings: Not on file  . Marital Status: Not on file  Intimate Partner Violence:   . Fear of Current or Ex-Partner: Not on file  . Emotionally Abused: Not on file  . Physically Abused: Not on file  . Sexually Abused: Not on file    Family History  Problem Relation Age of Onset  . Heart Problems Mother   . CVA Mother   . Heart attack Mother 18  . CVA Father   . Colon cancer Neg Hx   . Clotting disorder Neg Hx     ROS- All systems are reviewed and negative except as per the HPI above  Physical Exam: Vitals:   07/17/19 1006  BP: 124/84  Pulse: 63  Weight: 72.8 kg  Height: 5\' 7"  (1.702 m)   Wt Readings from Last 3 Encounters:  07/17/19 72.8 kg  04/10/19 71.8 kg  10/05/18 69.4 kg    Labs: Lab Results  Component Value Date   NA 137 04/10/2019   K 4.3 04/10/2019   CL 105 04/10/2019   CO2 24 04/10/2019   GLUCOSE 101 (H) 04/10/2019   BUN 30 (H) 04/10/2019   CREATININE 0.77 04/10/2019   CALCIUM 9.5 04/10/2019   MG 2.1 04/10/2019    Lab Results  Component Value Date   INR 1.1 03/17/2016   No results found for: CHOL, HDL, LDLCALC, TRIG   GEN- The patient is well appearing, alert and oriented x 3 today.   Head- normocephalic, atraumatic Eyes-  Sclera clear, conjunctiva pink Ears- hearing intact Oropharynx- clear Neck- supple, no JVP Lymph- no cervical lymphadenopathy Lungs- Clear to ausculation bilaterally, normal work of breathing Heart- Regular rate and rhythm, no murmurs, rubs or gallops, PMI not laterally displaced GI- soft, NT, ND, + BS Extremities- no clubbing, cyanosis, or edema MS- no significant deformity or atrophy Skin- no rash or lesion Psych- euthymic mood, full affect Neuro- strength and sensation are intact  EKG-Sinus rhythm  with first degree AV block at 63  bpm, Pr int 266 ms, qrs int 72 ms, qtc 431 ms( stable)    Assessment and Plan: 1. Paroxysmal atrial fibrillation S/p ablation 02/2017  Staying in [place] per Dr. Rayann Heman Continue dofetilide 250 mcg bid/BB without change Continue eliquis 5 mg bid with chadsvasc score of 3  Bmet/mag today  2. HTN Stable  3. Recent covid infection  Recovered  Received antibody infusion   F/u with remote checks and afib clinic in 6 months    Butch Penny C. Jedaiah Rathbun, [facility] [address] [place], [place] [address] [phone]

## 2019-07-18 ENCOUNTER — Ambulatory Visit (INDEPENDENT_AMBULATORY_CARE_PROVIDER_SITE_OTHER): Payer: PPO | Admitting: *Deleted

## 2019-07-18 DIAGNOSIS — I480 Paroxysmal atrial fibrillation: Secondary | ICD-10-CM | POA: Diagnosis not present

## 2019-07-19 LAB — CUP PACEART REMOTE DEVICE CHECK
Date Time Interrogation Session: 20210128170649
Date Time Interrogation Session: 20210128000500
Implantable Pulse Generator Implant Date: 20190514
Implantable Pulse Generator Implant Date: 20190514

## 2019-07-19 NOTE — Progress Notes (Signed)
ILR Remote 

## 2019-07-25 DIAGNOSIS — M62838 Other muscle spasm: Secondary | ICD-10-CM | POA: Diagnosis not present

## 2019-07-25 DIAGNOSIS — M6281 Muscle weakness (generalized): Secondary | ICD-10-CM | POA: Diagnosis not present

## 2019-07-25 DIAGNOSIS — N393 Stress incontinence (female) (male): Secondary | ICD-10-CM | POA: Diagnosis not present

## 2019-07-25 DIAGNOSIS — N3942 Incontinence without sensory awareness: Secondary | ICD-10-CM | POA: Diagnosis not present

## 2019-07-29 DIAGNOSIS — E782 Mixed hyperlipidemia: Secondary | ICD-10-CM | POA: Diagnosis not present

## 2019-07-29 DIAGNOSIS — M8580 Other specified disorders of bone density and structure, unspecified site: Secondary | ICD-10-CM | POA: Diagnosis not present

## 2019-07-29 DIAGNOSIS — I4891 Unspecified atrial fibrillation: Secondary | ICD-10-CM | POA: Diagnosis not present

## 2019-07-29 DIAGNOSIS — I5030 Unspecified diastolic (congestive) heart failure: Secondary | ICD-10-CM | POA: Diagnosis not present

## 2019-07-29 DIAGNOSIS — N182 Chronic kidney disease, stage 2 (mild): Secondary | ICD-10-CM | POA: Diagnosis not present

## 2019-07-31 DIAGNOSIS — N3942 Incontinence without sensory awareness: Secondary | ICD-10-CM | POA: Diagnosis not present

## 2019-07-31 DIAGNOSIS — M6281 Muscle weakness (generalized): Secondary | ICD-10-CM | POA: Diagnosis not present

## 2019-07-31 DIAGNOSIS — M62838 Other muscle spasm: Secondary | ICD-10-CM | POA: Diagnosis not present

## 2019-07-31 DIAGNOSIS — N393 Stress incontinence (female) (male): Secondary | ICD-10-CM | POA: Diagnosis not present

## 2019-08-07 DIAGNOSIS — M6281 Muscle weakness (generalized): Secondary | ICD-10-CM | POA: Diagnosis not present

## 2019-08-07 DIAGNOSIS — N393 Stress incontinence (female) (male): Secondary | ICD-10-CM | POA: Diagnosis not present

## 2019-08-07 DIAGNOSIS — M62838 Other muscle spasm: Secondary | ICD-10-CM | POA: Diagnosis not present

## 2019-08-07 DIAGNOSIS — N3942 Incontinence without sensory awareness: Secondary | ICD-10-CM | POA: Diagnosis not present

## 2019-08-14 DIAGNOSIS — N3942 Incontinence without sensory awareness: Secondary | ICD-10-CM | POA: Diagnosis not present

## 2019-08-14 DIAGNOSIS — M6281 Muscle weakness (generalized): Secondary | ICD-10-CM | POA: Diagnosis not present

## 2019-08-14 DIAGNOSIS — N393 Stress incontinence (female) (male): Secondary | ICD-10-CM | POA: Diagnosis not present

## 2019-08-14 DIAGNOSIS — M62838 Other muscle spasm: Secondary | ICD-10-CM | POA: Diagnosis not present

## 2019-08-16 ENCOUNTER — Telehealth (HOSPITAL_COMMUNITY): Payer: Self-pay | Admitting: *Deleted

## 2019-08-16 NOTE — Telephone Encounter (Signed)
I gave the  pt the number to [employer] support to get assistance trouble shooting the monitor or get a new one.

## 2019-08-16 NOTE — Telephone Encounter (Signed)
Pt will decrease propranolol to 10mg  BID over weekend since HR is running in the 40s on her BP machine. I will be in touch with her on Monday with update of symptoms. ER precautions reviewed with patient if should become symptomatic with lower heart rates.

## 2019-08-16 NOTE — Telephone Encounter (Signed)
Spoke with pt, attemtped to assist with sending manual transmission.  Unsuccessful, pt receiving error message that transmitter needs to charge.  She will let charge for about a 1/2 hour and then retry.  Gave pt DC phone to call if any problems.

## 2019-08-16 NOTE — Telephone Encounter (Signed)
Patient husband called in stating his wifes pulse ox is stating her HR is in the 30-40s. Pt is asymptomatic but they are concerned of HRs. Asked for a linq transmission to be sent so that we can see exactly what kind of rhythm she is in. He will have her attempt to send.

## 2019-08-19 ENCOUNTER — Ambulatory Visit (INDEPENDENT_AMBULATORY_CARE_PROVIDER_SITE_OTHER): Payer: PPO | Admitting: *Deleted

## 2019-08-19 DIAGNOSIS — I480 Paroxysmal atrial fibrillation: Secondary | ICD-10-CM | POA: Diagnosis not present

## 2019-08-19 LAB — CUP PACEART REMOTE DEVICE CHECK
Date Time Interrogation Session: 20210228232649
Implantable Pulse Generator Implant Date: 20190514

## 2019-08-19 NOTE — Telephone Encounter (Signed)
New monitor ordered 08/16/19 per [employer].

## 2019-08-19 NOTE — Telephone Encounter (Signed)
Patient states she had increase in heart rate with burst of AF over weekend. Pt went back up to propranolol 20mg  BID - using prn propanolol for breakthrough. Pt is supposed to receive new transmitter for linq today/tomorrow will send transmission once received to see what her HR/Rhythm has been doing. Pt in agreement.

## 2019-08-19 NOTE — Progress Notes (Signed)
ILR Remote 

## 2019-08-21 NOTE — Telephone Encounter (Signed)
Confirmed with [employer] representative that new monitor has been shipped and should arrive within the next few days.

## 2019-08-26 NOTE — Telephone Encounter (Signed)
Received transmission reviewed with Roderic Palau NP - increase in afib burden - will bring in to check electrolytes/ekg and further assess. Pt in agreement.

## 2019-08-27 ENCOUNTER — Encounter (HOSPITAL_COMMUNITY): Payer: Self-pay | Admitting: Nurse Practitioner

## 2019-08-27 ENCOUNTER — Other Ambulatory Visit: Payer: Self-pay

## 2019-08-27 ENCOUNTER — Ambulatory Visit (HOSPITAL_COMMUNITY)
Admission: RE | Admit: 2019-08-27 | Discharge: 2019-08-27 | Disposition: A | Discharge status: Home / Self Care | Payer: PPO | Source: Ambulatory Visit | Attending: Nurse Practitioner | Admitting: Nurse Practitioner

## 2019-08-27 VITALS — BP 122/78 | HR 66 | Ht 67.0 in | Wt 154.4 lb

## 2019-08-27 DIAGNOSIS — D6869 Other thrombophilia: Secondary | ICD-10-CM | POA: Diagnosis not present

## 2019-08-27 DIAGNOSIS — I10 Essential (primary) hypertension: Secondary | ICD-10-CM | POA: Diagnosis not present

## 2019-08-27 DIAGNOSIS — I4891 Unspecified atrial fibrillation: Secondary | ICD-10-CM | POA: Diagnosis present

## 2019-08-27 DIAGNOSIS — K219 Gastro-esophageal reflux disease without esophagitis: Secondary | ICD-10-CM | POA: Diagnosis not present

## 2019-08-27 DIAGNOSIS — Z8616 Personal history of COVID-19: Secondary | ICD-10-CM | POA: Insufficient documentation

## 2019-08-27 DIAGNOSIS — Z823 Family history of stroke: Secondary | ICD-10-CM | POA: Insufficient documentation

## 2019-08-27 DIAGNOSIS — Z79899 Other long term (current) drug therapy: Secondary | ICD-10-CM | POA: Diagnosis not present

## 2019-08-27 DIAGNOSIS — Z7901 Long term (current) use of anticoagulants: Secondary | ICD-10-CM | POA: Diagnosis not present

## 2019-08-27 DIAGNOSIS — Z8249 Family history of ischemic heart disease and other diseases of the circulatory system: Secondary | ICD-10-CM | POA: Insufficient documentation

## 2019-08-27 DIAGNOSIS — Z8619 Personal history of other infectious and parasitic diseases: Secondary | ICD-10-CM | POA: Diagnosis not present

## 2019-08-27 DIAGNOSIS — Z86718 Personal history of other venous thrombosis and embolism: Secondary | ICD-10-CM | POA: Diagnosis not present

## 2019-08-27 DIAGNOSIS — I480 Paroxysmal atrial fibrillation: Secondary | ICD-10-CM | POA: Diagnosis not present

## 2019-08-27 DIAGNOSIS — Z86711 Personal history of pulmonary embolism: Secondary | ICD-10-CM | POA: Diagnosis not present

## 2019-08-27 LAB — BASIC METABOLIC PANEL
Anion gap: 11 (ref 5–15)
BUN: 21 mg/dL (ref 8–23)
CO2: 24 mmol/L (ref 22–32)
Calcium: 9.7 mg/dL (ref 8.9–10.3)
Chloride: 108 mmol/L (ref 98–111)
Creatinine, Ser: 0.94 mg/dL (ref 0.44–1.00)
GFR calc Af Amer: >60 mL/min (ref >60)
GFR calc non Af Amer: 57 mL/min — ABNORMAL LOW (ref >60)
Glucose, Bld: 105 mg/dL — ABNORMAL HIGH (ref 70–99)
Potassium: 4.3 mmol/L (ref 3.5–5.1)
Sodium: 143 mmol/L (ref 135–145)

## 2019-08-27 LAB — MAGNESIUM: Magnesium: 2 mg/dL (ref 1.7–2.4)

## 2019-08-27 LAB — TSH: TSH: 1.094 u[IU]/mL (ref 0.350–4.500)

## 2019-08-27 NOTE — Progress Notes (Signed)
Primary Care Physician: Wenda Low, MD Referring Physician: Dr. Caprice Renshaw Christy [name] is a 79 y.o. female with a h/o atrial fib and PE/DVT 01/2016. She is has been treated with Rythmol and Tikosyn in the past. When her burden increased on Tikosyn, it was decided to pursue ablation 02/26/17.  She is in the afib clinic today, 04/10/19, for f/u of Tikosyn past ablation. She feels good. She has been staying in [place]. She has a linq, has intermittent palpitations but per Dr. Jackalyn Lombard note disorganized atrial activity, but not afib. She is leaving for [place] in Feb/March,2021 for a couple of months. Continues on eliquis 5 mg bid for a CHA2DS2VASc score of 5, no bleeding issues.  F/u in afib clinic, 07/18/19. She reports that she had Covid 19 the earlier part of the month while she and her husband were at the beach. They believe the exposure was from  2 furniture  delivery men  that were in their house for awhile, as they did not come into contact with other people and did not eat in restaurants.  She  and her husband both came down with it and received the antibody infusion at [facility] hospital. She overall felt better after that and she feels their symptoms were fairly mild. She did not have any afib while she was sick with covid. Continues on tikosyn and eliquis with a CHA2DS2VASc score of 5.  F/u in afib clinic, 08/27/19, pt called to office saying that she is seeing HR's by BP cuff in the 40's at home even though she feels well. We ran a Link report that shows PAC's as well as episodes of afib that usually  last around mins to one hour. Pt does not feel these episodes. Her longest afib episode was 50 mins on 2/26 and 2/27 with v rates 120-150, again pt did not feel this. EKG today shows SR with pac's. She feels well today.  Today, she denies symptoms of palpitations, chest pain, shortness of breath, orthopnea, PND, lower extremity edema, dizziness, presyncope, syncope, or neurologic sequela. The patient is  tolerating medications without difficulties and is otherwise without complaint today.   Past Medical History:  Diagnosis Date  . Atrial fibrillation ([place])   . Bilateral lower extremity edema    Chronic, likely related to venous stasis   . DJD (degenerative joint disease), lumbosacral   . Dyslipidemia   . Essential hypertension    "dx'd recently" (10/06/2016)  . GERD (gastroesophageal reflux disease)   . History of hepatitis C virus infection ~ 1997   In remission  . 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 Osteopenia   . Paroxysmal atrial tachycardia ([place])   . PE (pulmonary embolism) 01/2016  . Spondylolisthesis    Status post epidural injections 3 by neurosurgery  - Dr. Joya Salm   . Varicose veins of both legs with edema   . Vitamin D deficiency    Past Surgical History:  Procedure Laterality Date  . ATRIAL FIBRILLATION ABLATION N/A 03/09/2017   Procedure: Atrial Fibrillation Ablation;  Surgeon: [name], MD;  Location: [place] [facility];  Service: Cardiovascular;  Laterality: N/A;  . BACK SURGERY    . CARDIOVERSION N/A 03/21/2016   Procedure: CARDIOVERSION;  Surgeon: [name], MD;  Location: [place];  Service: Cardiovascular;  Laterality: N/A;  . COMBINED HYSTEROSCOPY DIAGNOSTIC / D&C  2005  . COMBINED HYSTEROSCOPY DIAGNOSTIC / D&C  1999  . DILATION AND CURETTAGE OF UTERUS    .  LOOP RECORDER INSERTION N/A 10/31/2017   Procedure: LOOP RECORDER INSERTION;  Surgeon: [name], MD;  Location: [place] [facility];  Service: Cardiovascular;  Laterality: N/A;  . NECK SURGERY  1995   "went in front and back; broken neck"  . TEE WITHOUT CARDIOVERSION N/A 03/08/2017   Procedure: TRANSESOPHAGEAL ECHOCARDIOGRAM (TEE);  Surgeon: Josue Hector, MD;  Location: [facility] ENDOSCOPY;  Service: Cardiovascular;  Laterality: N/A;  . TOE SURGERY Right    replaced joint  . TONSILLECTOMY    . TUBAL LIGATION Bilateral 1976  . VAGINAL DELIVERY     x2    Current Outpatient  Medications  Medication Sig Dispense Refill  . apixaban (ELIQUIS) 5 MG TABS tablet TAKE 1 TABLET BY MOUTH TWICE DAILY(START 02/03/2016) 180 tablet 3  . betamethasone valerate (VALISONE) 0.1 % cream APPLY TO VULVA BID PRN    . dofetilide (TIKOSYN) 250 MCG capsule TAKE 1 CAPSULE BY MOUTH TWICE DAILY IN THE MORNING AND DINNER FOR HEART 180 capsule 2  . furosemide (LASIX) 20 MG tablet Take 1 tablet (20 mg total) by mouth daily as needed for fluid or edema (weight gain of 3 lbs in 24 hrs or 5 lbs in 1 week). (Patient taking differently: Take 20 mg by mouth as needed for fluid or edema (weight gain of 3 lbs in 24 hrs or 5 lbs in 1 week). fluid, edema, weight gain of 3 lbs in 24 hrs or 5 lbs in 1 week) 30 tablet 3  . loratadine (CLARITIN) 10 MG tablet Take 10 mg by mouth daily.    . Magnesium 250 MG TABS Take 250 mg by mouth daily.    . multivitamin-iron-minerals-folic acid (CENTRUM) chewable tablet Chew 1 tablet by mouth daily.    . pantoprazole (PROTONIX) 40 MG tablet Take 40 mg by mouth 2 (two) times daily.     . propranolol (INDERAL) 10 MG tablet Take 1 tablet (10 mg total) by mouth daily as needed (for breakthrough afib). (Patient taking differently: Take 10 mg by mouth as needed (for breakthrough afib). ) 45 tablet 2  . propranolol (INDERAL) 20 MG tablet Take 1 tablet (20 mg total) by mouth 2 (two) times daily. 180 tablet 3   No current facility-administered medications for this encounter.    Allergies  Allergen Reactions  . Caffeine     Heart palpitations  . Epinephrine Palpitations    Social History   Socioeconomic History  . Marital status: Married    Spouse name: Not on file  . Number of children: Not on file  . Years of education: Not on file  . Highest education level: Not on file  Occupational History  . Not on file  Tobacco Use  . Smoking status: Never Smoker  . Smokeless tobacco: Never Used  Substance and Sexual Activity  . Alcohol use: Yes    Alcohol/week: 14.0 standard  drinks    Types: 14 Glasses of wine per week    Comment: 10/06/2016 "wine daily"  . Drug use: No  . Sexual activity: Yes    Birth control/protection: Other-see comments    Comment: tubal ligation  Other Topics Concern  . Not on file  Social History Narrative   She is a married mother of 1. Lives with her husband. 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 She has never smoked and drinks up to 10 glasses of wine or so week.   She usually exercises roughly 3 days a week doing yoga and plays golf. She  is mostly limited by her "time constraints "   Social Determinants of Health   Financial Resource Strain:   . Difficulty of Paying Living Expenses: Not on file  Food Insecurity:   . Worried About Charity fundraiser in the Last Year: Not on file  . Ran Out of Food in the Last Year: Not on file  Transportation Needs:   . Lack of Transportation (Medical): Not on file  . Lack of Transportation (Non-Medical): Not on file  Physical Activity:   . Days of Exercise per Week: Not on file  . Minutes of Exercise per Session: Not on file  Stress:   . Feeling of Stress : Not on file  Social Connections:   . Frequency of Communication with Friends and Family: Not on file  . Frequency of Social Gatherings with Friends and Family: Not on file  . Attends Religious Services: Not on file  . Active Member of Clubs or Organizations: Not on file  . Attends Archivist Meetings: Not on file  . Marital Status: Not on file  Intimate Partner Violence:   . Fear of Current or Ex-Partner: Not on file  . Emotionally Abused: Not on file  . Physically Abused: Not on file  . Sexually Abused: Not on file    Family History  Problem Relation Age of Onset  . Heart Problems Mother   . CVA Mother   . Heart attack Mother 82  . CVA Father   . Colon cancer Neg Hx   . Clotting disorder Neg Hx     ROS- All systems are reviewed and negative except as per the HPI above  Physical Exam: Vitals:    08/27/19 1036  BP: 122/78  Pulse: 66  Weight: 70 kg  Height: 5\' 7"  (1.702 m)   Wt Readings from Last 3 Encounters:  08/27/19 70 kg  07/17/19 72.8 kg  04/10/19 71.8 kg    Labs: Lab Results  Component Value Date   NA 142 07/17/2019   K 4.8 07/17/2019   CL 106 07/17/2019   CO2 26 07/17/2019   GLUCOSE 91 07/17/2019   BUN 23 07/17/2019   CREATININE 0.87 07/17/2019   CALCIUM 9.4 07/17/2019   MG 2.0 07/17/2019   Lab Results  Component Value Date   INR 1.1 03/17/2016   No results found for: CHOL, HDL, LDLCALC, TRIG   GEN- The patient is well appearing, alert and oriented x 3 today.   Head- normocephalic, atraumatic Eyes-  Sclera clear, conjunctiva pink Ears- hearing intact Oropharynx- clear Neck- supple, no JVP Lymph- no cervical lymphadenopathy Lungs- Clear to ausculation bilaterally, normal work of breathing Heart- Regular rate and rhythm, no murmurs, rubs or gallops, PMI not laterally displaced GI- soft, NT, ND, + BS Extremities- no clubbing, cyanosis, or edema MS- no significant deformity or atrophy Skin- no rash or lesion Psych- euthymic mood, full affect Neuro- strength and sensation are intact  EKG-Sinus rhythm with trigeminal PAC's, first degree AV block at 66 bpm, with  Pr int 266 ms, qrs int 72 ms, qtc 431 ms( stable)    Assessment and Plan: 1. Paroxysmal atrial fibrillation S/p ablation 02/2017  Staying in [place] with some breakthrough afib that pt is not aware, sinus brady at home in the 40's but feel this is an erroneous  BP cuff read-out  as she has PAC's falsely lowering HR Will continue  to monitor through Linq but no change today  Continue dofetilide 250 mcg bid/BB without change Continue  eliquis 5 mg bid with chadsvasc score of 3  Bmet/mag today  2. HTN Stable  3. Recent covid infection  Recovered  Received antibody infusion  Will receive covid injection in April   F/u with  afib clinic as previously scheduled    Butch Penny C. Jabreel Chimento,  [facility] [address] [place], [place] [address] [phone]

## 2019-08-28 DIAGNOSIS — N393 Stress incontinence (female) (male): Secondary | ICD-10-CM | POA: Diagnosis not present

## 2019-08-28 DIAGNOSIS — N3942 Incontinence without sensory awareness: Secondary | ICD-10-CM | POA: Diagnosis not present

## 2019-08-28 DIAGNOSIS — M6281 Muscle weakness (generalized): Secondary | ICD-10-CM | POA: Diagnosis not present

## 2019-08-28 DIAGNOSIS — M62838 Other muscle spasm: Secondary | ICD-10-CM | POA: Diagnosis not present

## 2019-08-30 ENCOUNTER — Telehealth: Payer: Self-pay

## 2019-08-30 NOTE — Telephone Encounter (Signed)
The pt needed help sending a manual transmission. I helped her. Transmission received. I let her talk with the device nurse [place], rn.

## 2019-08-30 NOTE — Telephone Encounter (Signed)
Patient called to see if transmission received. Confirmed transmission was received and request to review transmission. Patient currently appears to be in AF with controlled v-rates. Has had increasing episodes of AF and SR with PACs. + Eliquis. Patient was seen in AF Clinic 08/29/19 by Kayleen Memos NP. Reports she has felt the increased episodes of irregular heart rates and that she feels really tired. No CP, no sob, no palpitations.Will forward to AF clinic for evaluation.

## 2019-08-31 ENCOUNTER — Other Ambulatory Visit: Payer: Self-pay | Admitting: Internal Medicine

## 2019-09-02 ENCOUNTER — Other Ambulatory Visit: Payer: Self-pay | Admitting: Nurse Practitioner

## 2019-09-02 MED ORDER — DOFETILIDE 250 MCG PO CAPS: ORAL_CAPSULE | ORAL | 2 refills | Status: DC

## 2019-09-02 NOTE — Telephone Encounter (Signed)
Appointment being requested to discuss ablation versus change of AAD per Roderic Palau.

## 2019-09-04 ENCOUNTER — Encounter: Payer: Self-pay | Admitting: Internal Medicine

## 2019-09-04 ENCOUNTER — Telehealth: Payer: Self-pay | Admitting: *Deleted

## 2019-09-04 ENCOUNTER — Telehealth (INDEPENDENT_AMBULATORY_CARE_PROVIDER_SITE_OTHER): Payer: PPO | Admitting: Internal Medicine

## 2019-09-04 ENCOUNTER — Other Ambulatory Visit: Payer: Self-pay

## 2019-09-04 VITALS — BP 131/73 | HR 61 | Ht 67.0 in | Wt 151.0 lb

## 2019-09-04 DIAGNOSIS — I491 Atrial premature depolarization: Secondary | ICD-10-CM

## 2019-09-04 DIAGNOSIS — R002 Palpitations: Secondary | ICD-10-CM

## 2019-09-04 DIAGNOSIS — I340 Nonrheumatic mitral (valve) insufficiency: Secondary | ICD-10-CM

## 2019-09-04 DIAGNOSIS — I10 Essential (primary) hypertension: Secondary | ICD-10-CM | POA: Diagnosis not present

## 2019-09-04 DIAGNOSIS — D6869 Other thrombophilia: Secondary | ICD-10-CM

## 2019-09-04 DIAGNOSIS — I480 Paroxysmal atrial fibrillation: Secondary | ICD-10-CM | POA: Diagnosis not present

## 2019-09-04 DIAGNOSIS — I051 Rheumatic mitral insufficiency: Secondary | ICD-10-CM

## 2019-09-04 NOTE — Progress Notes (Signed)
Electrophysiology TeleHealth Note   Due to national recommendations of social distancing due to COVID 19, an audio/video telehealth visit is felt to be most appropriate for this patient at this time.  See MyChart message from today for the patient's consent to telehealth for [facility].  Date:  09/04/2019   ID:  DREWCILLA IANNI, DOB December 22, 1940, MRN [id number]  Location: patient's home  Provider location:  [facility]  Evaluation Performed: Follow-up visit  PCP:  Wenda Low, MD   Electrophysiologist:  Dr Rayann Heman  Chief Complaint:  palpitations  History of Present Illness:    DEVONN SUGAWARA is a 79 y.o. female who presents via telehealth conferencing today.  Since last being seen in our clinic, the patient reports doing reasonably well.  She had COVID in January.  She has recently noticed worsening fatigue and lower heart rates.  She presented to the AF clinic and was noted to have sinus bradycardia with PACs.  Today, she denies symptoms of chest pain, shortness of breath,  lower extremity edema, dizziness, presyncope, or syncope.  The patient is otherwise without complaint today.  The patient denies symptoms of fevers, chills, cough, or new SOB worrisome for COVID 19.  Past Medical History:  Diagnosis Date  . Atrial fibrillation ([place])   . Bilateral lower extremity edema    Chronic, likely related to venous stasis   . DJD (degenerative joint disease), lumbosacral   . Dyslipidemia   . Essential hypertension    "dx'd recently" (10/06/2016)  . GERD (gastroesophageal reflux disease)   . History of hepatitis C virus infection ~ 1997   In remission  . Lung granuloma ([place]) 2004    CT scan  . Osteopenia   . Paroxysmal atrial tachycardia ([place])   . PE (pulmonary embolism) 01/2016  . Spondylolisthesis    Status post epidural injections 3 by neurosurgery  - Dr. Joya Salm   . Varicose veins of both legs with edema   . Vitamin D deficiency     Past Surgical History:  Procedure  Laterality Date  . ATRIAL FIBRILLATION ABLATION N/A 03/09/2017   Procedure: Atrial Fibrillation Ablation;  Surgeon: [name], MD;  Location: [place] [facility];  Service: Cardiovascular;  Laterality: N/A;  . BACK SURGERY    . CARDIOVERSION N/A 03/21/2016   Procedure: CARDIOVERSION;  Surgeon: [name], MD;  Location: [place];  Service: Cardiovascular;  Laterality: N/A;  . COMBINED HYSTEROSCOPY DIAGNOSTIC / D&C  2005  . COMBINED HYSTEROSCOPY DIAGNOSTIC / D&C  1999  . DILATION AND CURETTAGE OF UTERUS    . LOOP RECORDER INSERTION N/A 10/31/2017   Procedure: LOOP RECORDER INSERTION;  Surgeon: [name], MD;  Location: [place] [facility];  Service: Cardiovascular;  Laterality: N/A;  . NECK SURGERY  1995   "went in front and back; broken neck"  . TEE WITHOUT CARDIOVERSION N/A 03/08/2017   Procedure: TRANSESOPHAGEAL ECHOCARDIOGRAM (TEE);  Surgeon: Josue Hector, MD;  Location: [facility] ENDOSCOPY;  Service: Cardiovascular;  Laterality: N/A;  . TOE SURGERY Right    replaced joint  . TONSILLECTOMY    . TUBAL LIGATION Bilateral 1976  . VAGINAL DELIVERY     x2    Current Outpatient Medications  Medication Sig Dispense Refill  . apixaban (ELIQUIS) 5 MG TABS tablet TAKE 1 TABLET BY MOUTH TWICE DAILY(START 02/03/2016) 180 tablet 3  . betamethasone valerate (VALISONE) 0.1 % cream APPLY TO VULVA BID PRN    . dofetilide (TIKOSYN) 250 MCG capsule TAKE 1  CAPSULE BY MOUTH TWICE DAILY IN THE MORNING AND DINNER FOR HEART 180 capsule 2  . furosemide (LASIX) 20 MG tablet Take 1 tablet (20 mg total) by mouth daily as needed for fluid or edema (weight gain of 3 lbs in 24 hrs or 5 lbs in 1 week). 30 tablet 3  . loratadine (CLARITIN) 10 MG tablet Take 10 mg by mouth daily.    . Magnesium 250 MG TABS Take 250 mg by mouth daily.    . multivitamin-iron-minerals-folic acid (CENTRUM) chewable tablet Chew 1 tablet by mouth daily.    . pantoprazole (PROTONIX) 40 MG tablet Take 40 mg by mouth 2 (two) times  daily.     . propranolol (INDERAL) 10 MG tablet Take 1 tablet (10 mg total) by mouth daily as needed (for breakthrough afib). 45 tablet 2  . propranolol (INDERAL) 20 MG tablet Take 1 tablet (20 mg total) by mouth 2 (two) times daily. 180 tablet 3   No current facility-administered medications for this visit.    Allergies:   Caffeine and Epinephrine   Social History:  The patient  reports that she has never smoked. She has never used smokeless tobacco. She reports current alcohol use of about 14.0 standard drinks of alcohol per week. She reports that she does not use drugs.   ROS:  Please see the history of present illness.   All other systems are personally reviewed and negative.    Exam:    Vital Signs:  BP 131/73   Pulse 61   Ht 5\' 7"  (1.702 m)   Wt 151 lb (68.5 kg)   SpO2 93%   BMI 23.65 kg/m   Well sounding and appearing, alert and conversant, regular work of breathing,  good skin color Eyes- anicteric, neuro- grossly intact, skin- no apparent rash or lesions or cyanosis, mouth- oral mucosa is pink  Labs/Other Tests and Data Reviewed:    Recent Labs: 08/27/2019: BUN 21; Creatinine, Ser 0.94; Magnesium 2.0; Potassium 4.3; Sodium 143; TSH 1.094   Wt Readings from Last 3 Encounters:  09/04/19 151 lb (68.5 kg)  08/27/19 154 lb 6.4 oz (70 kg)  07/17/19 160 lb 6.4 oz (72.8 kg)     Last device remote is reviewed from [place] PDF which reveals normal device function, no arrhythmias   ASSESSMENT & PLAN:    1.  PACs/ atrial fibrillation The patient presents with increased palpitations due to AF.  Her ILR does not show afib.  On her prior ep study (reviewed), she had multiple arrhythmias and atriopathy.  I am not overly optimistic about another ablation She is on tikosyn. She discussed the importance of close follow-up to avoid toxicity with this medicine Chads2vasc score is 3.  she is anticoagulated with eliquis . Therapeutic strategies for her PACs including medicine  (flecainide, amiodarone) and repeat ablation were discussed in detail with the patient today. We also discussed lifestyle change, including reduction of alcohol. As her symptoms seem to have begun after COVID 19 in January, I would like to give it some more time before we make any changes. We will continue to follow with ILR. Return for further discussion in 6-8 weeks  2. Moderate MR Update echo (last echo 2018 reviewed)  3. HTN Stable No change required today    Patient Risk:  after full review of this patients clinical status, I feel that they are at high risk at this time.  Today, I have spent 20 minutes with the patient with telehealth technology discussing arrhythmia  management .    Army Fossa, MD  09/04/2019 9:42 AM     [place] [address] [phone] (office) [phone] (fax)

## 2019-09-04 NOTE — Telephone Encounter (Signed)
-----   Message from [name], MD sent at 09/04/2019  9:52 AM EDT ----- Please order echo to follow-up for mitral regurgitation  Follow-up with me in 6-8

## 2019-09-05 DIAGNOSIS — M62838 Other muscle spasm: Secondary | ICD-10-CM | POA: Diagnosis not present

## 2019-09-05 DIAGNOSIS — M6281 Muscle weakness (generalized): Secondary | ICD-10-CM | POA: Diagnosis not present

## 2019-09-05 DIAGNOSIS — N3942 Incontinence without sensory awareness: Secondary | ICD-10-CM | POA: Diagnosis not present

## 2019-09-05 DIAGNOSIS — N393 Stress incontinence (female) (male): Secondary | ICD-10-CM | POA: Diagnosis not present

## 2019-09-11 ENCOUNTER — Telehealth (HOSPITAL_COMMUNITY): Payer: Self-pay | Admitting: *Deleted

## 2019-09-11 MED ORDER — DILTIAZEM HCL 30 MG PO TABS: ORAL_TABLET | ORAL | 1 refills | Status: DC

## 2019-09-11 NOTE — Telephone Encounter (Signed)
Patient called in stating she went into AF last night HR in the 115-120s she took 2 doses of PRN propranolol between 1am-5am then took her normal dose of propranolol 20mg  now her HR is in the upper 30s BP 90/58 she does not feel well. Discussed with Roderic Palau NP - encouraged increased fluid intake, strict ER precautions reviewed with patient. Pt does not feel like she is going to pass out but just weak and tired. She will take it easy this morning until HR normalizes. Going forward pt will use PRN cardizem 30mg  tabs for breakthrough AF instead of prn propranolol.

## 2019-09-12 ENCOUNTER — Ambulatory Visit (HOSPITAL_COMMUNITY): Payer: PPO | Attending: Cardiology

## 2019-09-12 ENCOUNTER — Other Ambulatory Visit: Payer: Self-pay

## 2019-09-12 DIAGNOSIS — I340 Nonrheumatic mitral (valve) insufficiency: Secondary | ICD-10-CM | POA: Diagnosis not present

## 2019-09-16 DIAGNOSIS — N393 Stress incontinence (female) (male): Secondary | ICD-10-CM | POA: Diagnosis not present

## 2019-09-16 DIAGNOSIS — M62838 Other muscle spasm: Secondary | ICD-10-CM | POA: Diagnosis not present

## 2019-09-16 DIAGNOSIS — M6281 Muscle weakness (generalized): Secondary | ICD-10-CM | POA: Diagnosis not present

## 2019-09-16 DIAGNOSIS — N3942 Incontinence without sensory awareness: Secondary | ICD-10-CM | POA: Diagnosis not present

## 2019-09-17 DIAGNOSIS — M8580 Other specified disorders of bone density and structure, unspecified site: Secondary | ICD-10-CM | POA: Diagnosis not present

## 2019-09-17 DIAGNOSIS — I5030 Unspecified diastolic (congestive) heart failure: Secondary | ICD-10-CM | POA: Diagnosis not present

## 2019-09-17 DIAGNOSIS — N182 Chronic kidney disease, stage 2 (mild): Secondary | ICD-10-CM | POA: Diagnosis not present

## 2019-09-17 DIAGNOSIS — I4891 Unspecified atrial fibrillation: Secondary | ICD-10-CM | POA: Diagnosis not present

## 2019-09-17 DIAGNOSIS — E782 Mixed hyperlipidemia: Secondary | ICD-10-CM | POA: Diagnosis not present

## 2019-09-19 ENCOUNTER — Ambulatory Visit (INDEPENDENT_AMBULATORY_CARE_PROVIDER_SITE_OTHER): Payer: PPO | Admitting: *Deleted

## 2019-09-19 DIAGNOSIS — I480 Paroxysmal atrial fibrillation: Secondary | ICD-10-CM

## 2019-09-19 LAB — CUP PACEART REMOTE DEVICE CHECK
Date Time Interrogation Session: 20210401003054
Implantable Pulse Generator Implant Date: 20190514

## 2019-09-19 NOTE — Progress Notes (Signed)
ILR Remote 

## 2019-10-02 DIAGNOSIS — N182 Chronic kidney disease, stage 2 (mild): Secondary | ICD-10-CM | POA: Diagnosis not present

## 2019-10-02 DIAGNOSIS — I4891 Unspecified atrial fibrillation: Secondary | ICD-10-CM | POA: Diagnosis not present

## 2019-10-02 DIAGNOSIS — M8580 Other specified disorders of bone density and structure, unspecified site: Secondary | ICD-10-CM | POA: Diagnosis not present

## 2019-10-02 DIAGNOSIS — I5030 Unspecified diastolic (congestive) heart failure: Secondary | ICD-10-CM | POA: Diagnosis not present

## 2019-10-02 DIAGNOSIS — E782 Mixed hyperlipidemia: Secondary | ICD-10-CM | POA: Diagnosis not present

## 2019-10-07 ENCOUNTER — Ambulatory Visit: Payer: PPO

## 2019-10-18 ENCOUNTER — Encounter: Payer: Self-pay | Admitting: Internal Medicine

## 2019-10-18 ENCOUNTER — Telehealth (INDEPENDENT_AMBULATORY_CARE_PROVIDER_SITE_OTHER): Payer: PPO | Admitting: Internal Medicine

## 2019-10-18 VITALS — BP 130/80 | HR 68 | Ht 67.0 in | Wt 148.0 lb

## 2019-10-18 DIAGNOSIS — I471 Supraventricular tachycardia: Secondary | ICD-10-CM | POA: Diagnosis not present

## 2019-10-18 DIAGNOSIS — D6869 Other thrombophilia: Secondary | ICD-10-CM | POA: Diagnosis not present

## 2019-10-18 DIAGNOSIS — R599 Enlarged lymph nodes, unspecified: Secondary | ICD-10-CM | POA: Diagnosis not present

## 2019-10-18 DIAGNOSIS — I340 Nonrheumatic mitral (valve) insufficiency: Secondary | ICD-10-CM | POA: Diagnosis not present

## 2019-10-18 DIAGNOSIS — I10 Essential (primary) hypertension: Secondary | ICD-10-CM | POA: Diagnosis not present

## 2019-10-18 DIAGNOSIS — I480 Paroxysmal atrial fibrillation: Secondary | ICD-10-CM | POA: Diagnosis not present

## 2019-10-18 NOTE — Progress Notes (Signed)
Electrophysiology TeleHealth Note   Due to national recommendations of social distancing due to COVID 19, an audio/video telehealth visit is felt to be most appropriate for this patient at this time.  See MyChart message from today for the patient's consent to telehealth for [facility].  Date:  10/18/2019   ID:  Christy [name], DOB Jan 07, 1941, MRN [id number]  Location: patient's home  Provider location:  [place]  Evaluation Performed: Follow-up visit  PCP:  Christy Low, MD   Electrophysiologist:  Dr Christy [name]  Chief Complaint:  palpitations  History of Present Illness:    Christy [name] is a 79 y.o. female who presents via telehealth conferencing today.  Since last being seen in our clinic, the patient reports doing very well. Her afib burden is [name].  Today, she denies symptoms of palpitations, chest pain, shortness of breath,  lower extremity edema, dizziness, presyncope, or syncope.  The patient is otherwise without complaint today.   Past Medical History:  Diagnosis Date  . Atrial fibrillation ([place])   . Bilateral lower extremity edema    Chronic, likely related to venous stasis   . DJD (degenerative joint disease), lumbosacral   . Dyslipidemia   . Essential hypertension    "dx'd recently" (10/06/2016)  . GERD (gastroesophageal reflux disease)   . History of hepatitis C virus infection ~ 1997   In remission  . Lung granuloma ([place]) 2004    CT scan  . Osteopenia   . Paroxysmal atrial tachycardia ([place])   . PE (pulmonary embolism) 01/2016  . Spondylolisthesis    Status post epidural injections 3 by neurosurgery  - Dr. Joya Salm   . Varicose veins of both legs with edema   . Vitamin D deficiency     Past Surgical History:  Procedure Laterality Date  . ATRIAL FIBRILLATION ABLATION N/A 03/09/2017   Procedure: Atrial Fibrillation Ablation;  Surgeon: [name], MD;  Location: [place] [facility];  Service: Cardiovascular;  Laterality: N/A;  . BACK SURGERY      . CARDIOVERSION N/A 03/21/2016   Procedure: CARDIOVERSION;  Surgeon: [name], MD;  Location: [place];  Service: Cardiovascular;  Laterality: N/A;  . COMBINED HYSTEROSCOPY DIAGNOSTIC / D&C  2005  . COMBINED HYSTEROSCOPY DIAGNOSTIC / D&C  1999  . DILATION AND CURETTAGE OF UTERUS    . LOOP RECORDER INSERTION N/A 10/31/2017   Procedure: LOOP RECORDER INSERTION;  Surgeon: [name], MD;  Location: [place] [facility];  Service: Cardiovascular;  Laterality: N/A;  . NECK SURGERY  1995   "went in front and back; broken neck"  . TEE WITHOUT CARDIOVERSION N/A 03/08/2017   Procedure: TRANSESOPHAGEAL ECHOCARDIOGRAM (TEE);  Surgeon: Josue Hector, MD;  Location: [facility] ENDOSCOPY;  Service: Cardiovascular;  Laterality: N/A;  . TOE SURGERY Right    replaced joint  . TONSILLECTOMY    . TUBAL LIGATION Bilateral 1976  . VAGINAL DELIVERY     x2    Current Outpatient Medications  Medication Sig Dispense Refill  . apixaban (ELIQUIS) 5 MG TABS tablet TAKE 1 TABLET BY MOUTH TWICE DAILY(START 02/03/2016) 180 tablet 3  . betamethasone valerate (VALISONE) 0.1 % cream APPLY TO VULVA BID PRN    . diltiazem (CARDIZEM) 30 MG tablet Take 1 tablet every 4 hours AS NEEDED for AFIB heart rate >100 45 tablet 1  . dofetilide (TIKOSYN) 250 MCG capsule TAKE 1 CAPSULE BY MOUTH TWICE DAILY IN THE MORNING AND DINNER FOR HEART 180 capsule 2  . furosemide (  LASIX) 20 MG tablet Take 1 tablet (20 mg total) by mouth daily as needed for fluid or edema (weight gain of 3 lbs in 24 hrs or 5 lbs in 1 week). 30 tablet 3  . loratadine (CLARITIN) 10 MG tablet Take 10 mg by mouth daily.    . Magnesium 250 MG TABS Take 250 mg by mouth daily.    . multivitamin-iron-minerals-folic acid (CENTRUM) chewable tablet Chew 1 tablet by mouth daily.    . pantoprazole (PROTONIX) 40 MG tablet Take 40 mg by mouth 2 (two) times daily.     . propranolol (INDERAL) 20 MG tablet Take 1 tablet (20 mg total) by mouth 2 (two) times daily. 180 tablet  3  . Zinc 50 MG TABS Take 1 tablet by mouth daily.     No current facility-administered medications for this visit.    Allergies:   Caffeine and Epinephrine   Social History:  The patient  reports that she has never smoked. She has never used smokeless tobacco. She reports current alcohol use of about 14.0 standard drinks of alcohol per week. She reports that she does not use drugs.   ROS:  Please see the history of present illness.   All other systems are personally reviewed and negative.    Exam:    Vital Signs:  BP 130/80   Pulse 68   Ht 5\' 7"  (1.702 m)   Wt 148 lb (67.1 kg)   BMI 23.18 kg/m   Well sounding and appearing, alert and conversant, regular work of breathing,  good skin color Eyes- anicteric, neuro- grossly intact, skin- no apparent rash or lesions or cyanosis, mouth- oral mucosa is pink  Labs/Other Tests and Data Reviewed:    Recent Labs: 08/27/2019: BUN 21; Creatinine, Ser 0.94; Magnesium 2.0; Potassium 4.3; Sodium 143; TSH 1.094   Wt Readings from Last 3 Encounters:  10/18/19 148 lb (67.1 kg)  09/04/19 151 lb (68.5 kg)  08/27/19 154 lb 6.4 oz (70 kg)     Last device remote is reviewed from [place] PDF which reveals normal device function, afib burden is 1.4%,  Frequent atrial ectopy is noted  Echo 09/12/19-  EF 60%,  Moderate LA enlargement,  Severe biatrial enlargement,  Mild to moderate MR  ASSESSMENT & PLAN:    1.  Paroxysmal atrial fibrillation/ atach/ multiple atypical atrial flutter circuits Doing very well post ablation. Her AF burden is [name].  She is pleased with currently state with tikosyn. The importance of close monitoriing to avoid toxicity with Christy [name] has been discussed today. chads2vasc score is 3.  She is on eliquis Labs 08/2019 are reviewed She has severe biatrial enlargement.  I am not optimistic that additional ablation would offer her benefit.  2. HTN Stable No change required today  3. MR Mild to moderate   Follow-up:  3  months in AF clinic 6 months with me   Patient Risk:  after full review of this patients clinical status, I feel that they are at moderate risk at this time.  Today, I have spent 15 minutes with the patient with telehealth technology discussing arrhythmia management .    Army Fossa, MD  10/18/2019 11:46 AM     [facility] [address] [place] [address] [phone] (office) [phone] (fax)

## 2019-10-20 LAB — CUP PACEART REMOTE DEVICE CHECK
Date Time Interrogation Session: 20210502003351
Implantable Pulse Generator Implant Date: 20190514

## 2019-10-21 ENCOUNTER — Ambulatory Visit (INDEPENDENT_AMBULATORY_CARE_PROVIDER_SITE_OTHER): Payer: PPO | Admitting: *Deleted

## 2019-10-21 DIAGNOSIS — I4819 Other persistent atrial fibrillation: Secondary | ICD-10-CM

## 2019-10-22 NOTE — Progress Notes (Signed)
Carelink Summary Report / Loop Recorder 

## 2019-10-23 ENCOUNTER — Telehealth: Payer: Self-pay

## 2019-10-23 NOTE — Telephone Encounter (Signed)
Spoke with patient to inform of disconnected monitor. °

## 2019-10-25 NOTE — Telephone Encounter (Signed)
I spoke with the pt to let her know we did receive her transmission 10-25-2019.

## 2019-10-25 NOTE — Telephone Encounter (Signed)
Patient not sure why monitor seen as disconnected. She will attempt to send transmission. Please let her know if received.

## 2019-11-19 DIAGNOSIS — N762 Acute vulvitis: Secondary | ICD-10-CM | POA: Insufficient documentation

## 2019-11-20 DIAGNOSIS — M25561 Pain in right knee: Secondary | ICD-10-CM | POA: Diagnosis not present

## 2019-11-20 DIAGNOSIS — M25562 Pain in left knee: Secondary | ICD-10-CM | POA: Diagnosis not present

## 2019-11-21 ENCOUNTER — Ambulatory Visit (INDEPENDENT_AMBULATORY_CARE_PROVIDER_SITE_OTHER): Payer: PPO | Admitting: *Deleted

## 2019-11-21 DIAGNOSIS — I480 Paroxysmal atrial fibrillation: Secondary | ICD-10-CM

## 2019-11-21 LAB — CUP PACEART REMOTE DEVICE CHECK
Date Time Interrogation Session: 20210602230954
Implantable Pulse Generator Implant Date: 20190514

## 2019-11-26 NOTE — Progress Notes (Signed)
Carelink Summary Report / Loop Recorder 

## 2019-12-04 DIAGNOSIS — I5030 Unspecified diastolic (congestive) heart failure: Secondary | ICD-10-CM | POA: Diagnosis not present

## 2019-12-04 DIAGNOSIS — K219 Gastro-esophageal reflux disease without esophagitis: Secondary | ICD-10-CM | POA: Diagnosis not present

## 2019-12-04 DIAGNOSIS — B1920 Unspecified viral hepatitis C without hepatic coma: Secondary | ICD-10-CM | POA: Diagnosis not present

## 2019-12-04 DIAGNOSIS — S81819A Laceration without foreign body, unspecified lower leg, initial encounter: Secondary | ICD-10-CM | POA: Diagnosis not present

## 2019-12-04 DIAGNOSIS — J309 Allergic rhinitis, unspecified: Secondary | ICD-10-CM | POA: Diagnosis not present

## 2019-12-04 DIAGNOSIS — I2699 Other pulmonary embolism without acute cor pulmonale: Secondary | ICD-10-CM | POA: Diagnosis not present

## 2019-12-04 DIAGNOSIS — I4891 Unspecified atrial fibrillation: Secondary | ICD-10-CM | POA: Diagnosis not present

## 2019-12-04 DIAGNOSIS — D6869 Other thrombophilia: Secondary | ICD-10-CM | POA: Diagnosis not present

## 2019-12-13 DIAGNOSIS — I4891 Unspecified atrial fibrillation: Secondary | ICD-10-CM | POA: Diagnosis not present

## 2019-12-13 DIAGNOSIS — I5030 Unspecified diastolic (congestive) heart failure: Secondary | ICD-10-CM | POA: Diagnosis not present

## 2019-12-13 DIAGNOSIS — E782 Mixed hyperlipidemia: Secondary | ICD-10-CM | POA: Diagnosis not present

## 2019-12-13 DIAGNOSIS — M8580 Other specified disorders of bone density and structure, unspecified site: Secondary | ICD-10-CM | POA: Diagnosis not present

## 2019-12-13 DIAGNOSIS — N182 Chronic kidney disease, stage 2 (mild): Secondary | ICD-10-CM | POA: Diagnosis not present

## 2019-12-24 ENCOUNTER — Ambulatory Visit (INDEPENDENT_AMBULATORY_CARE_PROVIDER_SITE_OTHER): Payer: PPO | Admitting: *Deleted

## 2019-12-24 DIAGNOSIS — I480 Paroxysmal atrial fibrillation: Secondary | ICD-10-CM

## 2019-12-24 LAB — CUP PACEART REMOTE DEVICE CHECK
Date Time Interrogation Session: 20210706022448
Implantable Pulse Generator Implant Date: 20190514

## 2019-12-25 NOTE — Progress Notes (Signed)
Carelink Summary Report / Loop Recorder 

## 2020-01-15 ENCOUNTER — Ambulatory Visit (HOSPITAL_COMMUNITY): Payer: Medicare Other | Admitting: Nurse Practitioner

## 2020-01-17 DIAGNOSIS — S81812A Laceration without foreign body, left lower leg, initial encounter: Secondary | ICD-10-CM | POA: Diagnosis not present

## 2020-01-17 DIAGNOSIS — T148XXA Other injury of unspecified body region, initial encounter: Secondary | ICD-10-CM | POA: Diagnosis not present

## 2020-01-23 ENCOUNTER — Ambulatory Visit (INDEPENDENT_AMBULATORY_CARE_PROVIDER_SITE_OTHER): Payer: PPO | Admitting: *Deleted

## 2020-01-23 DIAGNOSIS — I480 Paroxysmal atrial fibrillation: Secondary | ICD-10-CM | POA: Diagnosis not present

## 2020-01-27 LAB — CUP PACEART REMOTE DEVICE CHECK
Date Time Interrogation Session: 20210808022439
Implantable Pulse Generator Implant Date: 20190514

## 2020-01-27 NOTE — Progress Notes (Signed)
Carelink Summary Report / Loop Recorder 

## 2020-01-29 DIAGNOSIS — M8580 Other specified disorders of bone density and structure, unspecified site: Secondary | ICD-10-CM | POA: Diagnosis not present

## 2020-01-29 DIAGNOSIS — I5030 Unspecified diastolic (congestive) heart failure: Secondary | ICD-10-CM | POA: Diagnosis not present

## 2020-01-29 DIAGNOSIS — I4891 Unspecified atrial fibrillation: Secondary | ICD-10-CM | POA: Diagnosis not present

## 2020-01-29 DIAGNOSIS — E782 Mixed hyperlipidemia: Secondary | ICD-10-CM | POA: Diagnosis not present

## 2020-01-29 DIAGNOSIS — N182 Chronic kidney disease, stage 2 (mild): Secondary | ICD-10-CM | POA: Diagnosis not present

## 2020-02-06 DIAGNOSIS — R609 Edema, unspecified: Secondary | ICD-10-CM | POA: Diagnosis not present

## 2020-02-06 DIAGNOSIS — L03116 Cellulitis of left lower limb: Secondary | ICD-10-CM | POA: Diagnosis not present

## 2020-02-25 ENCOUNTER — Ambulatory Visit (INDEPENDENT_AMBULATORY_CARE_PROVIDER_SITE_OTHER): Payer: PPO | Admitting: *Deleted

## 2020-02-25 DIAGNOSIS — I480 Paroxysmal atrial fibrillation: Secondary | ICD-10-CM | POA: Diagnosis not present

## 2020-02-27 ENCOUNTER — Encounter (HOSPITAL_COMMUNITY): Payer: Self-pay | Admitting: Physician Assistant

## 2020-02-27 ENCOUNTER — Ambulatory Visit (HOSPITAL_COMMUNITY)
Admission: RE | Admit: 2020-02-27 | Discharge: 2020-02-27 | Disposition: A | Discharge status: Home / Self Care | Payer: PPO | Source: Ambulatory Visit | Attending: Nurse Practitioner | Admitting: Nurse Practitioner

## 2020-02-27 ENCOUNTER — Other Ambulatory Visit: Payer: Self-pay

## 2020-02-27 VITALS — BP 132/70 | HR 56 | Ht 67.0 in | Wt 158.6 lb

## 2020-02-27 DIAGNOSIS — Z888 Allergy status to other drugs, medicaments and biological substances status: Secondary | ICD-10-CM | POA: Insufficient documentation

## 2020-02-27 DIAGNOSIS — I4819 Other persistent atrial fibrillation: Secondary | ICD-10-CM | POA: Insufficient documentation

## 2020-02-27 DIAGNOSIS — Z7901 Long term (current) use of anticoagulants: Secondary | ICD-10-CM | POA: Diagnosis not present

## 2020-02-27 DIAGNOSIS — D6869 Other thrombophilia: Secondary | ICD-10-CM | POA: Insufficient documentation

## 2020-02-27 DIAGNOSIS — Z86718 Personal history of other venous thrombosis and embolism: Secondary | ICD-10-CM | POA: Insufficient documentation

## 2020-02-27 DIAGNOSIS — I10 Essential (primary) hypertension: Secondary | ICD-10-CM | POA: Insufficient documentation

## 2020-02-27 DIAGNOSIS — K219 Gastro-esophageal reflux disease without esophagitis: Secondary | ICD-10-CM | POA: Insufficient documentation

## 2020-02-27 DIAGNOSIS — Z79899 Other long term (current) drug therapy: Secondary | ICD-10-CM | POA: Diagnosis not present

## 2020-02-27 DIAGNOSIS — I480 Paroxysmal atrial fibrillation: Secondary | ICD-10-CM | POA: Diagnosis not present

## 2020-02-27 DIAGNOSIS — Z8249 Family history of ischemic heart disease and other diseases of the circulatory system: Secondary | ICD-10-CM | POA: Diagnosis not present

## 2020-02-27 DIAGNOSIS — Z8616 Personal history of COVID-19: Secondary | ICD-10-CM | POA: Diagnosis not present

## 2020-02-27 DIAGNOSIS — Z86711 Personal history of pulmonary embolism: Secondary | ICD-10-CM | POA: Diagnosis not present

## 2020-02-27 LAB — BASIC METABOLIC PANEL
Anion gap: 9 (ref 5–15)
BUN: 21 mg/dL (ref 8–23)
CO2: 26 mmol/L (ref 22–32)
Calcium: 9.5 mg/dL (ref 8.9–10.3)
Chloride: 106 mmol/L (ref 98–111)
Creatinine, Ser: 0.84 mg/dL (ref 0.44–1.00)
GFR calc Af Amer: >60 mL/min (ref >60)
GFR calc non Af Amer: >60 mL/min (ref >60)
Glucose, Bld: 87 mg/dL (ref 70–99)
Potassium: 4.5 mmol/L (ref 3.5–5.1)
Sodium: 141 mmol/L (ref 135–145)

## 2020-02-27 LAB — MAGNESIUM: Magnesium: 2 mg/dL (ref 1.7–2.4)

## 2020-02-27 NOTE — Progress Notes (Signed)
Primary Care Physician: Wenda Low, MD Referring Physician: Dr. Meda Coffee Primary EP: Dr Rayann Heman   Christy [name] is a 79 y.o. female with a h/o atrial fib and PE/DVT 01/2016. She is has been treated with Rythmol and Tikosyn in the past. When her burden increased on Tikosyn, it was decided to pursue ablation 02/26/17.  She is in the afib clinic today, 04/10/19, for f/u of Tikosyn past ablation. She feels good. She has been staying in [place]. She has a linq, has intermittent palpitations but per Dr. Jackalyn Lombard note disorganized atrial activity, but not afib. She is leaving for [place] in Feb/March,2021 for a couple of months. Continues on eliquis 5 mg bid for a CHA2DS2VASc score of 5, no bleeding issues.  F/u in afib clinic, 07/18/19. She reports that she had Covid 19 the earlier part of the month while she and her husband were at the beach. They believe the exposure was from  2 furniture  delivery men  that were in their house for awhile, as they did not come into contact with other people and did not eat in restaurants.  She  and her husband both came down with it and received the antibody infusion at [facility] hospital. She overall felt better after that and she feels their symptoms were fairly mild. She did not have any afib while she was sick with covid. Continues on tikosyn and eliquis with a CHA2DS2VASc score of 5.  F/u in afib clinic, 08/27/19, pt called to office saying that she is seeing HR's by BP cuff in the 40's at home even though she feels well. We ran a Link report that shows PAC's as well as episodes of afib that usually  last around mins to one hour. Pt does not feel these episodes. Her longest afib episode was 50 mins on 2/26 and 2/27 with v rates 120-150, again pt did not feel this. EKG today shows SR with pac's. She feels well today.  Follow up in the AF clinic 02/27/20. Patient reports that she has done very well since her last visit. She denies any heart racing or palpitations. ILR shows low afib  burden at 0.2%. She denies any bleeding issues on anticoagulation.   Today, she denies symptoms of palpitations, chest pain, shortness of breath, orthopnea, PND, lower extremity edema, dizziness, presyncope, syncope, or neurologic sequela. The patient is tolerating medications without difficulties and is otherwise without complaint today.   Past Medical History:  Diagnosis Date  . Atrial fibrillation ([place])   . Bilateral lower extremity edema    Chronic, likely related to venous stasis   . DJD (degenerative joint disease), lumbosacral   . Dyslipidemia   . Essential hypertension    "dx'd recently" (10/06/2016)  . GERD (gastroesophageal reflux disease)   . History of hepatitis C virus infection ~ 1997   In remission  . Lung granuloma ([place]) 2004    CT scan  . Osteopenia   . Paroxysmal atrial tachycardia ([place])   . PE (pulmonary embolism) 01/2016  . Spondylolisthesis    Status post epidural injections 3 by neurosurgery  - Dr. Joya Salm   . Varicose veins of both legs with edema   . Vitamin D deficiency    Past Surgical History:  Procedure Laterality Date  . ATRIAL FIBRILLATION ABLATION N/A 03/09/2017   Procedure: Atrial Fibrillation Ablation;  Surgeon: [name], MD;  Location: [place] [facility];  Service: Cardiovascular;  Laterality: N/A;  . BACK SURGERY    . CARDIOVERSION N/A 03/21/2016  Procedure: CARDIOVERSION;  Surgeon: [name], MD;  Location: [place];  Service: Cardiovascular;  Laterality: N/A;  . COMBINED HYSTEROSCOPY DIAGNOSTIC / D&C  2005  . COMBINED HYSTEROSCOPY DIAGNOSTIC / D&C  1999  . DILATION AND CURETTAGE OF UTERUS    . LOOP RECORDER INSERTION N/A 10/31/2017   Procedure: LOOP RECORDER INSERTION;  Surgeon: [name], MD;  Location: [place] [facility];  Service: Cardiovascular;  Laterality: N/A;  . NECK SURGERY  1995   "went in front and back; broken neck"  . TEE WITHOUT CARDIOVERSION N/A 03/08/2017   Procedure: TRANSESOPHAGEAL ECHOCARDIOGRAM (TEE);   Surgeon: Josue Hector, MD;  Location: [facility] ENDOSCOPY;  Service: Cardiovascular;  Laterality: N/A;  . TOE SURGERY Right    replaced joint  . TONSILLECTOMY    . TUBAL LIGATION Bilateral 1976  . VAGINAL DELIVERY     x2    Current Outpatient Medications  Medication Sig Dispense Refill  . apixaban (ELIQUIS) 5 MG TABS tablet TAKE 1 TABLET BY MOUTH TWICE DAILY(START 02/03/2016) 180 tablet 3  . betamethasone valerate (VALISONE) 0.1 % cream APPLY TO VULVA BID PRN    . diltiazem (CARDIZEM) 30 MG tablet Take 1 tablet every 4 hours AS NEEDED for AFIB heart rate >100 45 tablet 1  . dofetilide (TIKOSYN) 250 MCG capsule TAKE 1 CAPSULE BY MOUTH TWICE DAILY IN THE MORNING AND DINNER FOR HEART 180 capsule 2  . furosemide (LASIX) 20 MG tablet Take 1 tablet (20 mg total) by mouth daily as needed for fluid or edema (weight gain of 3 lbs in 24 hrs or 5 lbs in 1 week). 30 tablet 3  . guaiFENesin (MUCINEX) 600 MG 12 hr tablet Take by mouth daily.    [place] loratadine (CLARITIN) 10 MG tablet Take 10 mg by mouth daily.    . Magnesium 250 MG TABS Take 250 mg by mouth daily.    . multivitamin-iron-minerals-folic acid (CENTRUM) chewable tablet Chew 1 tablet by mouth daily.    . pantoprazole (PROTONIX) 40 MG tablet Take 40 mg by mouth 2 (two) times daily.     . propranolol (INDERAL) 20 MG tablet Take 1 tablet (20 mg total) by mouth 2 (two) times daily. 180 tablet 3  . Zinc 50 MG TABS Take 1 tablet by mouth daily.     No current facility-administered medications for this encounter.    Allergies  Allergen Reactions  . Caffeine     Heart palpitations  . Epinephrine Palpitations    Social History   Socioeconomic History  . Marital status: Married    Spouse name: Not on file  . Number of children: Not on file  . Years of education: Not on file  . Highest education level: Not on file  Occupational History  . Not on file  Tobacco Use  . Smoking status: Never Smoker  . Smokeless tobacco: Never Used  Vaping Use    . Vaping Use: Never used  Substance and Sexual Activity  . Alcohol use: Yes    Alcohol/week: 14.0 standard drinks    Types: 14 Glasses of wine per week    Comment: 10/06/2016 "wine daily"  . Drug use: No  . Sexual activity: Yes    Birth control/protection: Other-see comments    Comment: tubal ligation  Other Topics Concern  . Not on file  Social History Narrative   She is a married mother of 1. Lives with her husband. 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 She has never smoked  and drinks up to 10 glasses of wine or so week.   She usually exercises roughly 3 days a week doing yoga and plays golf. She is mostly limited by her "time constraints "   Social Determinants of Radio broadcast assistant Strain:   . Difficulty of Paying Living Expenses: Not on file  Food Insecurity:   . Worried About Charity fundraiser in the Last Year: Not on file  . Ran Out of Food in the Last Year: Not on file  Transportation Needs:   . Lack of Transportation (Medical): Not on file  . Lack of Transportation (Non-Medical): Not on file  Physical Activity:   . Days of Exercise per Week: Not on file  . Minutes of Exercise per Session: Not on file  Stress:   . Feeling of Stress : Not on file  Social Connections:   . Frequency of Communication with Friends and Family: Not on file  . Frequency of Social Gatherings with Friends and Family: Not on file  . Attends Religious Services: Not on file  . Active Member of Clubs or Organizations: Not on file  . Attends Archivist Meetings: Not on file  . Marital Status: Not on file  Intimate Partner Violence:   . Fear of Current or Ex-Partner: Not on file  . Emotionally Abused: Not on file  . Physically Abused: Not on file  . Sexually Abused: Not on file    Family History  Problem Relation Age of Onset  . Heart Problems Mother   . CVA Mother   . Heart attack Mother 11  . CVA Father   . Colon cancer Neg Hx   . Clotting disorder Neg Hx      ROS- All systems are reviewed and negative except as per the HPI above  Physical Exam: Vitals:   02/27/20 1045  BP: 132/70  Pulse: (!) 56  Weight: 71.9 kg  Height: 5\' 7"  (1.702 m)   Wt Readings from Last 3 Encounters:  02/27/20 71.9 kg  10/18/19 67.1 kg  09/04/19 68.5 kg    Labs: Lab Results  Component Value Date   NA 143 08/27/2019   K 4.3 08/27/2019   CL 108 08/27/2019   CO2 24 08/27/2019   GLUCOSE 105 (H) 08/27/2019   BUN 21 08/27/2019   CREATININE 0.94 08/27/2019   CALCIUM 9.7 08/27/2019   MG 2.0 08/27/2019   Lab Results  Component Value Date   INR 1.1 03/17/2016   No results found for: CHOL, HDL, LDLCALC, TRIG   GEN- The patient is well appearing elderly female, alert and oriented x 3 today.   HEENT-head normocephalic, atraumatic, sclera clear, conjunctiva pink, hearing intact, trachea midline. Lungs- Clear to ausculation bilaterally, normal work of breathing Heart- Regular rate and rhythm, no murmurs, rubs or gallops  GI- soft, NT, ND, + BS Extremities- no clubbing, cyanosis, or edema MS- no significant deformity or atrophy Skin- no rash or lesion Psych- euthymic mood, full affect Neuro- strength and sensation are intact   EKG- SB HR 56, 1st degree AV block, PR 276, QRS 78, QTc 438   Assessment and Plan: 1. Paroxysmal atrial fibrillation S/p ablation 02/2017  ILR shows 0.2% afib burden. Continue dofetilide 250 mcg BID. QT stable. Continue propranolol 20 mg daily Continue eliquis 5 mg bid with chadsvasc score of 3  Bmet/mag today  2. HTN Stable, no changes today.    Follow up in the AF clinic in 6 months.   Bear Stearns  [facility] PA-C Afib [facility] [address] [place], [place] [address] [phone]

## 2020-02-28 ENCOUNTER — Ambulatory Visit (HOSPITAL_COMMUNITY): Payer: PPO | Admitting: Physician Assistant

## 2020-02-28 LAB — CUP PACEART REMOTE DEVICE CHECK
Date Time Interrogation Session: 20210910022611
Implantable Pulse Generator Implant Date: 20190514

## 2020-02-28 NOTE — Progress Notes (Signed)
Carelink Summary Report / Loop Recorder 

## 2020-03-15 DIAGNOSIS — N182 Chronic kidney disease, stage 2 (mild): Secondary | ICD-10-CM | POA: Diagnosis not present

## 2020-03-15 DIAGNOSIS — M8580 Other specified disorders of bone density and structure, unspecified site: Secondary | ICD-10-CM | POA: Diagnosis not present

## 2020-03-15 DIAGNOSIS — E782 Mixed hyperlipidemia: Secondary | ICD-10-CM | POA: Diagnosis not present

## 2020-03-15 DIAGNOSIS — I4891 Unspecified atrial fibrillation: Secondary | ICD-10-CM | POA: Diagnosis not present

## 2020-03-15 DIAGNOSIS — I5030 Unspecified diastolic (congestive) heart failure: Secondary | ICD-10-CM | POA: Diagnosis not present

## 2020-03-17 DIAGNOSIS — L814 Other melanin hyperpigmentation: Secondary | ICD-10-CM | POA: Diagnosis not present

## 2020-03-17 DIAGNOSIS — L570 Actinic keratosis: Secondary | ICD-10-CM | POA: Diagnosis not present

## 2020-03-17 DIAGNOSIS — L820 Inflamed seborrheic keratosis: Secondary | ICD-10-CM | POA: Diagnosis not present

## 2020-03-17 DIAGNOSIS — L821 Other seborrheic keratosis: Secondary | ICD-10-CM | POA: Diagnosis not present

## 2020-03-17 DIAGNOSIS — D1801 Hemangioma of skin and subcutaneous tissue: Secondary | ICD-10-CM | POA: Diagnosis not present

## 2020-03-17 DIAGNOSIS — D692 Other nonthrombocytopenic purpura: Secondary | ICD-10-CM | POA: Diagnosis not present

## 2020-03-17 DIAGNOSIS — Z85828 Personal history of other malignant neoplasm of skin: Secondary | ICD-10-CM | POA: Diagnosis not present

## 2020-03-20 DIAGNOSIS — M25561 Pain in right knee: Secondary | ICD-10-CM | POA: Diagnosis not present

## 2020-03-20 DIAGNOSIS — M25562 Pain in left knee: Secondary | ICD-10-CM | POA: Diagnosis not present

## 2020-03-26 ENCOUNTER — Ambulatory Visit: Payer: PPO

## 2020-04-01 LAB — CUP PACEART REMOTE DEVICE CHECK
Date Time Interrogation Session: 20211013022743
Implantable Pulse Generator Implant Date: 20190514

## 2020-04-06 ENCOUNTER — Ambulatory Visit: Payer: PPO | Admitting: Internal Medicine

## 2020-04-06 ENCOUNTER — Encounter: Payer: Self-pay | Admitting: Internal Medicine

## 2020-04-06 ENCOUNTER — Other Ambulatory Visit: Payer: Self-pay

## 2020-04-06 VITALS — BP 150/84 | HR 57 | Ht 67.0 in | Wt 157.8 lb

## 2020-04-06 DIAGNOSIS — I484 Atypical atrial flutter: Secondary | ICD-10-CM | POA: Diagnosis not present

## 2020-04-06 DIAGNOSIS — I10 Essential (primary) hypertension: Secondary | ICD-10-CM

## 2020-04-06 DIAGNOSIS — I471 Supraventricular tachycardia: Secondary | ICD-10-CM | POA: Diagnosis not present

## 2020-04-06 DIAGNOSIS — I480 Paroxysmal atrial fibrillation: Secondary | ICD-10-CM | POA: Diagnosis not present

## 2020-04-06 NOTE — Progress Notes (Signed)
PCP: Wenda Low, MD   Primary EP: Dr Richard Miu is a 79 y.o. female who presents today for routine electrophysiology followup.  Since last being seen in our clinic, the patient reports doing very well.  afib is well controlled.  Today, she denies symptoms of palpitations, chest pain, shortness of breath,  lower extremity edema, dizziness, presyncope, or syncope.  The patient is otherwise without complaint today.   Past Medical History:  Diagnosis Date  . Atrial fibrillation ([place])   . Bilateral lower extremity edema    Chronic, likely related to venous stasis   . DJD (degenerative joint disease), lumbosacral   . Dyslipidemia   . Essential hypertension    "dx'd recently" (10/06/2016)  . GERD (gastroesophageal reflux disease)   . History of hepatitis C virus infection ~ 1997   In remission  . Lung granuloma ([place]) 2004    CT scan  . Osteopenia   . Paroxysmal atrial tachycardia ([place])   . PE (pulmonary embolism) 01/2016  . Spondylolisthesis    Status post epidural injections 3 by neurosurgery  - Dr. Joya Salm   . Varicose veins of both legs with edema   . Vitamin D deficiency    Past Surgical History:  Procedure Laterality Date  . ATRIAL FIBRILLATION ABLATION N/A 03/09/2017   Procedure: Atrial Fibrillation Ablation;  Surgeon: [name], MD;  Location: [place] [facility];  Service: Cardiovascular;  Laterality: N/A;  . BACK SURGERY    . CARDIOVERSION N/A 03/21/2016   Procedure: CARDIOVERSION;  Surgeon: [name], MD;  Location: [place];  Service: Cardiovascular;  Laterality: N/A;  . COMBINED HYSTEROSCOPY DIAGNOSTIC / D&C  2005  . COMBINED HYSTEROSCOPY DIAGNOSTIC / D&C  1999  . DILATION AND CURETTAGE OF UTERUS    . LOOP RECORDER INSERTION N/A 10/31/2017   Procedure: LOOP RECORDER INSERTION;  Surgeon: [name], MD;  Location: [place] [facility];  Service: Cardiovascular;  Laterality: N/A;  . NECK SURGERY  1995   "went in front and back; broken neck"    . TEE WITHOUT CARDIOVERSION N/A 03/08/2017   Procedure: TRANSESOPHAGEAL ECHOCARDIOGRAM (TEE);  Surgeon: Josue Hector, MD;  Location: [facility];  Service: Cardiovascular;  Laterality: N/A;  . TOE SURGERY Right    replaced joint  . TONSILLECTOMY    . TUBAL LIGATION Bilateral 1976  . VAGINAL DELIVERY     x2    ROS- all systems are reviewed and negatives except as per HPI above  Current Outpatient Medications  Medication Sig Dispense Refill  . apixaban (ELIQUIS) 5 MG TABS tablet TAKE 1 TABLET BY MOUTH TWICE DAILY(START 02/03/2016) 180 tablet 3  . betamethasone valerate (VALISONE) 0.1 % cream APPLY TO VULVA BID PRN    . diltiazem (CARDIZEM) 30 MG tablet Take 1 tablet every 4 hours AS NEEDED for AFIB heart rate >100 45 tablet 1  . dofetilide (TIKOSYN) 250 MCG capsule TAKE 1 CAPSULE BY MOUTH TWICE DAILY IN THE MORNING AND DINNER FOR HEART 180 capsule 2  . furosemide (LASIX) 20 MG tablet Take 1 tablet (20 mg total) by mouth daily as needed for fluid or edema (weight gain of 3 lbs in 24 hrs or 5 lbs in 1 week). 30 tablet 3  . guaiFENesin (MUCINEX) 600 MG 12 hr tablet Take by mouth daily.    Marland Kitchen loratadine (CLARITIN) 10 MG tablet Take 10 mg by mouth daily.    . Magnesium 250 MG TABS Take 250 mg by mouth daily.    Marland Kitchen  multivitamin-iron-minerals-folic acid (CENTRUM) chewable tablet Chew 1 tablet by mouth daily.    . pantoprazole (PROTONIX) 40 MG tablet Take 40 mg by mouth 2 (two) times daily.     . propranolol (INDERAL) 20 MG tablet Take 1 tablet (20 mg total) by mouth 2 (two) times daily. 180 tablet 3  . Zinc 50 MG TABS Take 1 tablet by mouth daily.     No current facility-administered medications for this visit.    Physical Exam: Vitals:   04/06/20 1553  BP: (!) 150/84  Pulse: (!) 57  SpO2: 96%  Weight: 157 lb 12.8 oz (71.6 kg)  Height: 5\' 7"  (1.702 m)    GEN- The patient is well appearing, alert and oriented x 3 today.   Head- normocephalic, atraumatic Eyes-  Sclera clear,  conjunctiva pink Ears- hearing intact Oropharynx- clear Lungs- Clear to ausculation bilaterally, normal work of breathing Heart- Regular rate and rhythm, no murmurs, rubs or gallops, PMI not laterally displaced GI- soft, NT, ND, + BS Extremities- no clubbing, cyanosis, or edema  Wt Readings from Last 3 Encounters:  04/06/20 157 lb 12.8 oz (71.6 kg)  02/27/20 158 lb 9.6 oz (71.9 kg)  10/18/19 148 lb (67.1 kg)    EKG tracing ordered today is personally reviewed and shows sinus rhythm 57 bpm, PR 264 msec, Qtc 426 msec  Assessment and Plan:  1. Paroxysmal atrial fibrillation/ atach/ multiple atypical atrial flutter circuits Doing very well post ablation with tikosyn despite severe biatrial enlargement bmet mg are reviewed from 02/27/20 afib burden is 0.3% by ILR She is not a candidate for additional ablation She requires close follow-up on tikosyn to avoid toxicity with this mediciner  2. HTN Stable No change required today  3. MR Mild to moderate by prior echo  Risks, benefits and potential toxicities for medications prescribed and/or refilled reviewed with patient today.   She has scheduled follow-up in the AF clinic in 6 months I will see in a year  [name] MD, [facility] 04/06/2020 4:02 PM

## 2020-04-06 NOTE — Patient Instructions (Signed)
Medication Instructions:  Your physician recommends that you continue on your current medications as directed. Please refer to the Current Medication list given to you today.  *If you need a refill on your cardiac medications before your next appointment, please call your pharmacy*  Lab Work: None ordered.  If you have labs (blood work) drawn today and your tests are completely normal, you will receive your results only by: Marland Kitchen MyChart Message (if you have MyChart) OR . A paper copy in the mail If you have any lab test that is abnormal or we need to change your treatment, we will call you to review the results.  Testing/Procedures: None ordered.  Follow-Up: At [facility], you and your health needs are our priority.  As part of our continuing mission to provide you with exceptional heart care, we have created designated Provider Care Teams.  These Care Teams include your primary Cardiologist (physician) and Advanced Practice Providers (APPs -  Physician Assistants and Nurse Practitioners) who all work together to provide you with the care you need, when you need it.  We recommend signing up for the patient portal called "MyChart".  Sign up information is provided on this After Visit Summary.  MyChart is used to connect with patients for Virtual Visits (Telemedicine).  Patients are able to view lab/test results, encounter notes, upcoming appointments, etc.  Non-urgent messages can be sent to your provider as well.   To learn more about what you can do with MyChart, go to [email or web address].    Your next appointment:   Your physician wants you to follow-up in: 1 year with Dr. Rayann Heman.You will receive a reminder letter in the mail two months in advance. If you don't receive a letter, please call our office to schedule the follow-up appointment.   Other Instructions:

## 2020-04-13 LAB — CUP PACEART INCLINIC DEVICE CHECK
Date Time Interrogation Session: 20211018164226
Implantable Pulse Generator Implant Date: 20190514

## 2020-04-27 ENCOUNTER — Ambulatory Visit (INDEPENDENT_AMBULATORY_CARE_PROVIDER_SITE_OTHER): Payer: PPO

## 2020-04-27 DIAGNOSIS — I480 Paroxysmal atrial fibrillation: Secondary | ICD-10-CM | POA: Diagnosis not present

## 2020-04-27 LAB — CUP PACEART REMOTE DEVICE CHECK
Date Time Interrogation Session: 20211107233641
Implantable Pulse Generator Implant Date: 20190514

## 2020-04-28 NOTE — Progress Notes (Signed)
Carelink Summary Report / Loop Recorder 

## 2020-04-29 DIAGNOSIS — Z1231 Encounter for screening mammogram for malignant neoplasm of breast: Secondary | ICD-10-CM | POA: Diagnosis not present

## 2020-05-11 DIAGNOSIS — I4891 Unspecified atrial fibrillation: Secondary | ICD-10-CM | POA: Diagnosis not present

## 2020-05-11 DIAGNOSIS — M8580 Other specified disorders of bone density and structure, unspecified site: Secondary | ICD-10-CM | POA: Diagnosis not present

## 2020-05-11 DIAGNOSIS — N182 Chronic kidney disease, stage 2 (mild): Secondary | ICD-10-CM | POA: Diagnosis not present

## 2020-05-11 DIAGNOSIS — K219 Gastro-esophageal reflux disease without esophagitis: Secondary | ICD-10-CM | POA: Diagnosis not present

## 2020-05-11 DIAGNOSIS — I5030 Unspecified diastolic (congestive) heart failure: Secondary | ICD-10-CM | POA: Diagnosis not present

## 2020-05-11 DIAGNOSIS — E782 Mixed hyperlipidemia: Secondary | ICD-10-CM | POA: Diagnosis not present

## 2020-05-24 ENCOUNTER — Other Ambulatory Visit: Payer: Self-pay | Admitting: Nurse Practitioner

## 2020-06-01 ENCOUNTER — Ambulatory Visit (INDEPENDENT_AMBULATORY_CARE_PROVIDER_SITE_OTHER): Payer: PPO

## 2020-06-01 DIAGNOSIS — I480 Paroxysmal atrial fibrillation: Secondary | ICD-10-CM

## 2020-06-01 LAB — CUP PACEART REMOTE DEVICE CHECK
Date Time Interrogation Session: 20211210233620
Implantable Pulse Generator Implant Date: 20190514

## 2020-06-16 NOTE — Progress Notes (Signed)
Carelink Summary Report / Loop Recorder 

## 2020-07-06 ENCOUNTER — Ambulatory Visit (INDEPENDENT_AMBULATORY_CARE_PROVIDER_SITE_OTHER): Payer: PPO

## 2020-07-06 DIAGNOSIS — I10 Essential (primary) hypertension: Secondary | ICD-10-CM | POA: Diagnosis not present

## 2020-07-06 DIAGNOSIS — M8580 Other specified disorders of bone density and structure, unspecified site: Secondary | ICD-10-CM | POA: Diagnosis not present

## 2020-07-06 DIAGNOSIS — I480 Paroxysmal atrial fibrillation: Secondary | ICD-10-CM

## 2020-07-06 DIAGNOSIS — I4891 Unspecified atrial fibrillation: Secondary | ICD-10-CM | POA: Diagnosis not present

## 2020-07-06 DIAGNOSIS — E782 Mixed hyperlipidemia: Secondary | ICD-10-CM | POA: Diagnosis not present

## 2020-07-06 DIAGNOSIS — I5030 Unspecified diastolic (congestive) heart failure: Secondary | ICD-10-CM | POA: Diagnosis not present

## 2020-07-06 DIAGNOSIS — K219 Gastro-esophageal reflux disease without esophagitis: Secondary | ICD-10-CM | POA: Diagnosis not present

## 2020-07-06 DIAGNOSIS — N182 Chronic kidney disease, stage 2 (mild): Secondary | ICD-10-CM | POA: Diagnosis not present

## 2020-07-09 LAB — CUP PACEART REMOTE DEVICE CHECK
Date Time Interrogation Session: 20220112234945
Implantable Pulse Generator Implant Date: 20190514

## 2020-07-16 DIAGNOSIS — I10 Essential (primary) hypertension: Secondary | ICD-10-CM | POA: Diagnosis not present

## 2020-07-16 DIAGNOSIS — I4891 Unspecified atrial fibrillation: Secondary | ICD-10-CM | POA: Diagnosis not present

## 2020-07-16 DIAGNOSIS — E559 Vitamin D deficiency, unspecified: Secondary | ICD-10-CM | POA: Diagnosis not present

## 2020-07-16 DIAGNOSIS — N182 Chronic kidney disease, stage 2 (mild): Secondary | ICD-10-CM | POA: Diagnosis not present

## 2020-07-16 DIAGNOSIS — B1920 Unspecified viral hepatitis C without hepatic coma: Secondary | ICD-10-CM | POA: Diagnosis not present

## 2020-07-16 DIAGNOSIS — Z1389 Encounter for screening for other disorder: Secondary | ICD-10-CM | POA: Diagnosis not present

## 2020-07-16 DIAGNOSIS — D6869 Other thrombophilia: Secondary | ICD-10-CM | POA: Diagnosis not present

## 2020-07-16 DIAGNOSIS — I5030 Unspecified diastolic (congestive) heart failure: Secondary | ICD-10-CM | POA: Diagnosis not present

## 2020-07-16 DIAGNOSIS — Z0000 Encounter for general adult medical examination without abnormal findings: Secondary | ICD-10-CM | POA: Diagnosis not present

## 2020-07-16 DIAGNOSIS — E782 Mixed hyperlipidemia: Secondary | ICD-10-CM | POA: Diagnosis not present

## 2020-07-16 DIAGNOSIS — M8580 Other specified disorders of bone density and structure, unspecified site: Secondary | ICD-10-CM | POA: Diagnosis not present

## 2020-07-16 DIAGNOSIS — I2699 Other pulmonary embolism without acute cor pulmonale: Secondary | ICD-10-CM | POA: Diagnosis not present

## 2020-07-20 NOTE — Progress Notes (Signed)
Carelink Summary Report / Loop Recorder 

## 2020-07-22 DIAGNOSIS — R32 Unspecified urinary incontinence: Secondary | ICD-10-CM | POA: Diagnosis not present

## 2020-07-22 DIAGNOSIS — Z6825 Body mass index (BMI) 25.0-25.9, adult: Secondary | ICD-10-CM | POA: Diagnosis not present

## 2020-07-22 DIAGNOSIS — N762 Acute vulvitis: Secondary | ICD-10-CM | POA: Diagnosis not present

## 2020-07-22 DIAGNOSIS — Z01419 Encounter for gynecological examination (general) (routine) without abnormal findings: Secondary | ICD-10-CM | POA: Diagnosis not present

## 2020-07-29 DIAGNOSIS — I4891 Unspecified atrial fibrillation: Secondary | ICD-10-CM | POA: Diagnosis not present

## 2020-08-05 LAB — CUP PACEART REMOTE DEVICE CHECK
Date Time Interrogation Session: 20220214235120
Implantable Pulse Generator Implant Date: 20190514

## 2020-08-10 ENCOUNTER — Ambulatory Visit (INDEPENDENT_AMBULATORY_CARE_PROVIDER_SITE_OTHER): Payer: PPO

## 2020-08-10 DIAGNOSIS — I480 Paroxysmal atrial fibrillation: Secondary | ICD-10-CM | POA: Diagnosis not present

## 2020-08-18 NOTE — Progress Notes (Signed)
Carelink Summary Report / Loop Recorder 

## 2020-08-26 ENCOUNTER — Encounter (HOSPITAL_COMMUNITY): Payer: Self-pay | Admitting: Nurse Practitioner

## 2020-08-26 ENCOUNTER — Ambulatory Visit (HOSPITAL_COMMUNITY)
Admission: RE | Admit: 2020-08-26 | Discharge: 2020-08-26 | Disposition: A | Discharge status: Home / Self Care | Payer: PPO | Source: Ambulatory Visit | Attending: Nurse Practitioner | Admitting: Nurse Practitioner

## 2020-08-26 ENCOUNTER — Other Ambulatory Visit: Payer: Self-pay

## 2020-08-26 VITALS — BP 142/78 | HR 56 | Ht 67.0 in | Wt 160.8 lb

## 2020-08-26 DIAGNOSIS — Z86718 Personal history of other venous thrombosis and embolism: Secondary | ICD-10-CM | POA: Diagnosis not present

## 2020-08-26 DIAGNOSIS — M1711 Unilateral primary osteoarthritis, right knee: Secondary | ICD-10-CM | POA: Diagnosis not present

## 2020-08-26 DIAGNOSIS — Z7901 Long term (current) use of anticoagulants: Secondary | ICD-10-CM | POA: Diagnosis not present

## 2020-08-26 DIAGNOSIS — I480 Paroxysmal atrial fibrillation: Secondary | ICD-10-CM | POA: Insufficient documentation

## 2020-08-26 DIAGNOSIS — I10 Essential (primary) hypertension: Secondary | ICD-10-CM | POA: Diagnosis not present

## 2020-08-26 DIAGNOSIS — Z79899 Other long term (current) drug therapy: Secondary | ICD-10-CM | POA: Insufficient documentation

## 2020-08-26 DIAGNOSIS — D6869 Other thrombophilia: Secondary | ICD-10-CM

## 2020-08-26 DIAGNOSIS — M1712 Unilateral primary osteoarthritis, left knee: Secondary | ICD-10-CM | POA: Diagnosis not present

## 2020-08-26 LAB — BASIC METABOLIC PANEL
Anion gap: 8 (ref 5–15)
BUN: 17 mg/dL (ref 8–23)
CO2: 26 mmol/L (ref 22–32)
Calcium: 9.4 mg/dL (ref 8.9–10.3)
Chloride: 104 mmol/L (ref 98–111)
Creatinine, Ser: 0.88 mg/dL (ref 0.44–1.00)
GFR, Estimated: >60 mL/min (ref >60)
Glucose, Bld: 105 mg/dL — ABNORMAL HIGH (ref 70–99)
Potassium: 4.4 mmol/L (ref 3.5–5.1)
Sodium: 138 mmol/L (ref 135–145)

## 2020-08-26 LAB — MAGNESIUM: Magnesium: 2.1 mg/dL (ref 1.7–2.4)

## 2020-08-26 NOTE — Progress Notes (Signed)
Primary Care Physician: Wenda Low, MD Referring Physician: Dr. Caprice Renshaw Christy [name] is a 80 y.o. female with a h/o atrial fib and PE/DVT 01/2016. She is has been treated with Rythmol and Tikosyn in the past. When her burden increased on Tikosyn, it was decided to pursue ablation 02/26/17.  She is in the afib clinic today, 04/10/19, for f/u of Tikosyn past ablation. She feels good. She has been staying in [place]. She has a linq, has intermittent palpitations but per Dr. Jackalyn Lombard note disorganized atrial activity, but not afib. She is leaving for [place] in Feb/March,2021 for a couple of months. Continues on eliquis 5 mg bid for a CHA2DS2VASc score of 5, no bleeding issues.  F/u in afib clinic, 07/18/19. She reports that she had Covid 19 the earlier part of the month while she and her husband were at the beach. They believe the exposure was from  2 furniture  delivery men  that were in their house for awhile, as they did not come into contact with other people and did not eat in restaurants.  She  and her husband both came down with it and received the antibody infusion at [facility] hospital. She overall felt better after that and she feels their symptoms were fairly mild. She did not have any afib while she was sick with covid. Continues on tikosyn and eliquis with a CHA2DS2VASc score of 5.  F/u in afib clinic, 08/27/19, pt called to office saying that she is seeing HR's by BP cuff in the 40's at home even though she feels well. We ran a Link report that shows PAC's as well as episodes of afib that usually  last around mins to one hour. Pt does not feel these episodes. Her longest afib episode was 50 mins on 2/26 and 2/27 with v rates 120-150, again pt did not feel this. EKG today shows SR with pac's. She feels well today.  F/u in afib clinic, 08/27/20. Afib has been quiet recently. She saw Dr. Rayann Heman 06/01/21 and by his assessment then, he did not feel she was a repeat ablation candidate. She continues on  tikosyn. No bleeding issues with eliquis with a CHA2DS2VASc score of 5.   Today, she denies symptoms of palpitations, chest pain, shortness of breath, orthopnea, PND, lower extremity edema, dizziness, presyncope, syncope, or neurologic sequela. The patient is tolerating medications without difficulties and is otherwise without complaint today.   Past Medical History:  Diagnosis Date  . Atrial fibrillation ([place])   . Bilateral lower extremity edema    Chronic, likely related to venous stasis   . DJD (degenerative joint disease), lumbosacral   . Dyslipidemia   . Essential hypertension    "dx'd recently" (10/06/2016)  . GERD (gastroesophageal reflux disease)   . History of hepatitis C virus infection ~ 1997   In remission  . Lung granuloma ([place]) 2004    CT scan  . Osteopenia   . Paroxysmal atrial tachycardia ([place])   . PE (pulmonary embolism) 01/2016  . Spondylolisthesis    Status post epidural injections 3 by neurosurgery  - Dr. Joya Salm   . Varicose veins of both legs with edema   . Vitamin D deficiency    Past Surgical History:  Procedure Laterality Date  . ATRIAL FIBRILLATION ABLATION N/A 03/09/2017   Procedure: Atrial Fibrillation Ablation;  Surgeon: [name], MD;  Location: [place] [facility];  Service: Cardiovascular;  Laterality: N/A;  . BACK SURGERY    . CARDIOVERSION N/A  03/21/2016   Procedure: CARDIOVERSION;  Surgeon: [name], MD;  Location: [place];  Service: Cardiovascular;  Laterality: N/A;  . COMBINED HYSTEROSCOPY DIAGNOSTIC / D&C  2005  . COMBINED HYSTEROSCOPY DIAGNOSTIC / D&C  1999  . DILATION AND CURETTAGE OF UTERUS    . LOOP RECORDER INSERTION N/A 10/31/2017   Procedure: LOOP RECORDER INSERTION;  Surgeon: [name], MD;  Location: [place] [facility];  Service: Cardiovascular;  Laterality: N/A;  . NECK SURGERY  1995   "went in front and back; broken neck"  . TEE WITHOUT CARDIOVERSION N/A 03/08/2017   Procedure: TRANSESOPHAGEAL ECHOCARDIOGRAM  (TEE);  Surgeon: Josue Hector, MD;  Location: [facility] ENDOSCOPY;  Service: Cardiovascular;  Laterality: N/A;  . TOE SURGERY Right    replaced joint  . TONSILLECTOMY    . TUBAL LIGATION Bilateral 1976  . VAGINAL DELIVERY     x2    Current Outpatient Medications  Medication Sig Dispense Refill  . apixaban (ELIQUIS) 5 MG TABS tablet TAKE 1 TABLET BY MOUTH TWICE DAILY(START 02/03/2016) 180 tablet 3  . Ascorbic Acid (VITAMIN C) 1000 MG tablet Take 1,000 mg by mouth daily.    . betamethasone valerate (VALISONE) 0.1 % cream APPLY TO VULVA BID PRN    . cholecalciferol (VITAMIN D3) 25 MCG (1000 UNIT) tablet Take 1,000 Units by mouth daily.    Marland Kitchen diltiazem (CARDIZEM) 30 MG tablet Take 1 tablet every 4 hours AS NEEDED for AFIB heart rate >100 45 tablet 1  . dofetilide (TIKOSYN) 250 MCG capsule TAKE 1 CAPSULE BY MOUTH TWICE DAILY IN THE MORNING AND DINNER FOR HEART 180 capsule 2  . ELDERBERRY PO 1 tablet    . FLUAD QUADRIVALENT 0.5 ML injection     . furosemide (LASIX) 20 MG tablet Take 1 tablet (20 mg total) by mouth daily as needed for fluid or edema (weight gain of 3 lbs in 24 hrs or 5 lbs in 1 week). 30 tablet 3  . guaiFENesin (MUCINEX) 600 MG 12 hr tablet Take by mouth daily.    Marland Kitchen loratadine (CLARITIN) 10 MG tablet Take 10 mg by mouth daily.    Marland Kitchen losartan (COZAAR) 25 MG tablet Take 25 mg by mouth daily.    . Magnesium 250 MG TABS Take 250 mg by mouth daily.    . multivitamin-iron-minerals-folic acid (CENTRUM) chewable tablet Chew 1 tablet by mouth daily.    . pantoprazole (PROTONIX) 40 MG tablet Take 40 mg by mouth daily.    . propranolol (INDERAL) 20 MG tablet Take 1 tablet (20 mg total) by mouth 2 (two) times daily. 180 tablet 3   No current facility-administered medications for this encounter.    Allergies  Allergen Reactions  . Caffeine     Heart palpitations  . Epinephrine Palpitations    Social History   Socioeconomic History  . Marital status: Married    Spouse name: Not on  file  . Number of children: Not on file  . Years of education: Not on file  . Highest education level: Not on file  Occupational History  . Not on file  Tobacco Use  . Smoking status: Never Smoker  . Smokeless tobacco: Never Used  Vaping Use  . Vaping Use: Never used  Substance and Sexual Activity  . Alcohol use: Yes    Alcohol/week: 14.0 standard drinks    Types: 14 Glasses of wine per week    Comment: 10/06/2016 "wine daily"  . Drug use: No  . Sexual activity: Yes  Birth control/protection: Other-see comments    Comment: tubal ligation  Other Topics Concern  . Not on file  Social History Narrative   She is a married mother of 1. Lives with her husband. 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 She has never smoked and drinks up to 10 glasses of wine or so week.   She usually exercises roughly 3 days a week doing yoga and plays golf. She is mostly limited by her "time constraints "   Social Determinants of Radio broadcast assistant Strain: Not on file  Food Insecurity: Not on file  Transportation Needs: Not on file  Physical Activity: Not on file  Stress: Not on file  Social Connections: Not on file  Intimate Partner Violence: Not on file    Family History  Problem Relation Age of Onset  . Heart Problems Mother   . CVA Mother   . Heart attack Mother 44  . CVA Father   . Colon cancer Neg Hx   . Clotting disorder Neg Hx     ROS- All systems are reviewed and negative except as per the HPI above  Physical Exam: Vitals:   08/26/20 0915  BP: (!) 142/78  Pulse: (!) 56  Weight: 72.9 kg  Height: 5\' 7"  (1.702 m)   Wt Readings from Last 3 Encounters:  08/26/20 72.9 kg  04/06/20 71.6 kg  02/27/20 71.9 kg    Labs: Lab Results  Component Value Date   NA 141 02/27/2020   K 4.5 02/27/2020   CL 106 02/27/2020   CO2 26 02/27/2020   GLUCOSE 87 02/27/2020   BUN 21 02/27/2020   CREATININE 0.84 02/27/2020   CALCIUM 9.5 02/27/2020   MG 2.0 02/27/2020    Lab Results  Component Value Date   INR 1.1 03/17/2016   No results found for: CHOL, HDL, LDLCALC, TRIG   GEN- The patient is well appearing, alert and oriented x 3 today.   Head- normocephalic, atraumatic Eyes-  Sclera clear, conjunctiva pink Ears- hearing intact Oropharynx- clear Neck- supple, no JVP Lymph- no cervical lymphadenopathy Lungs- Clear to ausculation bilaterally, normal work of breathing Heart- Regular rate and rhythm, no murmurs, rubs or gallops, PMI not laterally displaced GI- soft, NT, ND, + BS Extremities- no clubbing, cyanosis, or edema MS- no significant deformity or atrophy Skin- no rash or lesion Psych- euthymic mood, full affect Neuro- strength and sensation are intact  EKG-Sinus Loletha Grayer with  first degree AV block at 56 bpm, with  Pr int 288 ms, qrs int 84 ms, qtc 451 ms( stable)    Assessment and Plan: 1. Paroxysmal atrial fibrillation S/p ablation 02/2017  Staying in [place] recently  Will continue  to monitor through Linq   Continue dofetilide 250 mcg bid/BB without change Continue eliquis 5 mg bid with chadsvasc score of 5 Bmet/mag today  2. HTN Stable   F/u with  afib clinic in 6  months    Butch Penny C. Nautika Cressey, [facility] [address] [place], [place] [address] [phone]

## 2020-09-08 DIAGNOSIS — E782 Mixed hyperlipidemia: Secondary | ICD-10-CM | POA: Diagnosis not present

## 2020-09-08 DIAGNOSIS — I4891 Unspecified atrial fibrillation: Secondary | ICD-10-CM | POA: Diagnosis not present

## 2020-09-08 DIAGNOSIS — N182 Chronic kidney disease, stage 2 (mild): Secondary | ICD-10-CM | POA: Diagnosis not present

## 2020-09-08 DIAGNOSIS — I5030 Unspecified diastolic (congestive) heart failure: Secondary | ICD-10-CM | POA: Diagnosis not present

## 2020-09-08 DIAGNOSIS — M8580 Other specified disorders of bone density and structure, unspecified site: Secondary | ICD-10-CM | POA: Diagnosis not present

## 2020-09-08 DIAGNOSIS — K219 Gastro-esophageal reflux disease without esophagitis: Secondary | ICD-10-CM | POA: Diagnosis not present

## 2020-09-08 DIAGNOSIS — I10 Essential (primary) hypertension: Secondary | ICD-10-CM | POA: Diagnosis not present

## 2020-09-12 LAB — CUP PACEART REMOTE DEVICE CHECK
Date Time Interrogation Session: 20220320005417
Implantable Pulse Generator Implant Date: 20190514

## 2020-09-14 ENCOUNTER — Ambulatory Visit (INDEPENDENT_AMBULATORY_CARE_PROVIDER_SITE_OTHER): Payer: PPO

## 2020-09-14 DIAGNOSIS — I480 Paroxysmal atrial fibrillation: Secondary | ICD-10-CM

## 2020-09-25 NOTE — Progress Notes (Signed)
Carelink Summary Report / Loop Recorder 

## 2020-10-05 DIAGNOSIS — R1310 Dysphagia, unspecified: Secondary | ICD-10-CM | POA: Diagnosis not present

## 2020-10-05 DIAGNOSIS — R1319 Other dysphagia: Secondary | ICD-10-CM | POA: Diagnosis not present

## 2020-10-08 ENCOUNTER — Other Ambulatory Visit: Payer: Self-pay | Admitting: Internal Medicine

## 2020-10-08 DIAGNOSIS — R1310 Dysphagia, unspecified: Secondary | ICD-10-CM

## 2020-10-08 DIAGNOSIS — J040 Acute laryngitis: Secondary | ICD-10-CM | POA: Diagnosis not present

## 2020-10-09 ENCOUNTER — Ambulatory Visit (INDEPENDENT_AMBULATORY_CARE_PROVIDER_SITE_OTHER): Payer: PPO

## 2020-10-09 DIAGNOSIS — I480 Paroxysmal atrial fibrillation: Secondary | ICD-10-CM | POA: Diagnosis not present

## 2020-10-09 LAB — CUP PACEART REMOTE DEVICE CHECK
Date Time Interrogation Session: 20220422005421
Implantable Pulse Generator Implant Date: 20190514

## 2020-10-16 DIAGNOSIS — D6869 Other thrombophilia: Secondary | ICD-10-CM | POA: Diagnosis not present

## 2020-10-16 DIAGNOSIS — I2699 Other pulmonary embolism without acute cor pulmonale: Secondary | ICD-10-CM | POA: Diagnosis not present

## 2020-10-16 DIAGNOSIS — J040 Acute laryngitis: Secondary | ICD-10-CM | POA: Diagnosis not present

## 2020-10-16 DIAGNOSIS — I10 Essential (primary) hypertension: Secondary | ICD-10-CM | POA: Diagnosis not present

## 2020-10-16 DIAGNOSIS — M8580 Other specified disorders of bone density and structure, unspecified site: Secondary | ICD-10-CM | POA: Diagnosis not present

## 2020-10-16 DIAGNOSIS — I5030 Unspecified diastolic (congestive) heart failure: Secondary | ICD-10-CM | POA: Diagnosis not present

## 2020-10-16 DIAGNOSIS — I4891 Unspecified atrial fibrillation: Secondary | ICD-10-CM | POA: Diagnosis not present

## 2020-10-21 DIAGNOSIS — L570 Actinic keratosis: Secondary | ICD-10-CM | POA: Diagnosis not present

## 2020-10-21 DIAGNOSIS — Z85828 Personal history of other malignant neoplasm of skin: Secondary | ICD-10-CM | POA: Diagnosis not present

## 2020-10-21 DIAGNOSIS — L821 Other seborrheic keratosis: Secondary | ICD-10-CM | POA: Diagnosis not present

## 2020-10-28 NOTE — Progress Notes (Signed)
Carelink Summary Report / Loop Recorder 

## 2020-10-30 ENCOUNTER — Other Ambulatory Visit: Payer: PPO

## 2020-11-02 ENCOUNTER — Ambulatory Visit: Payer: PPO | Admitting: Podiatry

## 2020-11-02 ENCOUNTER — Other Ambulatory Visit: Payer: Self-pay

## 2020-11-02 ENCOUNTER — Encounter: Payer: Self-pay | Admitting: Podiatry

## 2020-11-02 DIAGNOSIS — K219 Gastro-esophageal reflux disease without esophagitis: Secondary | ICD-10-CM | POA: Insufficient documentation

## 2020-11-02 DIAGNOSIS — L84 Corns and callosities: Secondary | ICD-10-CM | POA: Insufficient documentation

## 2020-11-02 DIAGNOSIS — E559 Vitamin D deficiency, unspecified: Secondary | ICD-10-CM | POA: Insufficient documentation

## 2020-11-02 DIAGNOSIS — I82402 Acute embolism and thrombosis of unspecified deep veins of left lower extremity: Secondary | ICD-10-CM | POA: Insufficient documentation

## 2020-11-02 DIAGNOSIS — M2010 Hallux valgus (acquired), unspecified foot: Secondary | ICD-10-CM | POA: Insufficient documentation

## 2020-11-02 DIAGNOSIS — D6869 Other thrombophilia: Secondary | ICD-10-CM | POA: Insufficient documentation

## 2020-11-02 DIAGNOSIS — B349 Viral infection, unspecified: Secondary | ICD-10-CM | POA: Insufficient documentation

## 2020-11-02 DIAGNOSIS — J309 Allergic rhinitis, unspecified: Secondary | ICD-10-CM | POA: Insufficient documentation

## 2020-11-02 DIAGNOSIS — E782 Mixed hyperlipidemia: Secondary | ICD-10-CM | POA: Insufficient documentation

## 2020-11-02 DIAGNOSIS — R300 Dysuria: Secondary | ICD-10-CM | POA: Insufficient documentation

## 2020-11-02 DIAGNOSIS — N182 Chronic kidney disease, stage 2 (mild): Secondary | ICD-10-CM | POA: Insufficient documentation

## 2020-11-02 DIAGNOSIS — J040 Acute laryngitis: Secondary | ICD-10-CM | POA: Insufficient documentation

## 2020-11-02 DIAGNOSIS — R1310 Dysphagia, unspecified: Secondary | ICD-10-CM | POA: Insufficient documentation

## 2020-11-02 DIAGNOSIS — M2041 Other hammer toe(s) (acquired), right foot: Secondary | ICD-10-CM | POA: Insufficient documentation

## 2020-11-02 DIAGNOSIS — M8580 Other specified disorders of bone density and structure, unspecified site: Secondary | ICD-10-CM | POA: Insufficient documentation

## 2020-11-02 DIAGNOSIS — I5032 Chronic diastolic (congestive) heart failure: Secondary | ICD-10-CM | POA: Insufficient documentation

## 2020-11-02 DIAGNOSIS — R32 Unspecified urinary incontinence: Secondary | ICD-10-CM | POA: Insufficient documentation

## 2020-11-02 NOTE — Progress Notes (Signed)
This patient presents the office with chief complaint of a painful skin lesion on her second toe right foot.  She says that she was golfing last week and she was experience severe pain and discomfort on the inside border second toe right foot.  She says that her toe has improved today but she presents the office today for evaluation of her painful skin lesion.  She admits that she is previously had bunion surgery on her right foot.  She has provided no self treatment nor sought any professional help.  She presents the office today for an evaluation of her painful second toe.  Vascular  Dorsalis pedis and posterior tibial pulses are palpable  B/L.  Capillary return  WNL.  Temperature gradient is  WNL.  Skin turgor  WNL  Sensorium  Senn Weinstein monofilament wire  WNL. Normal tactile sensation.  Nail Exam  Patient has normal nails with no evidence of bacterial or fungal infection.  Orthopedic  Exam  Muscle tone and muscle strength  WNL.  No limitations of motion feet  B/L.  No crepitus or joint effusion noted.  Foot type is unremarkable and digits show no abnormalities.  HAV  B/L.  Deviation second toe right foot at PIPJ.  Hammer toes  B/L.  Skin  No open lesions.  Normal skin texture and turgor.  Corn second toe right foot due to HAV and deviation of second toe hammer toe.  IE.    Debride corn second toe right.  Padding dispensed to this patient.  Discussed pathology and discussed continued conservative treatment vs. Surgical treatment.   Gardiner Barefoot DPM

## 2020-11-06 ENCOUNTER — Other Ambulatory Visit: Payer: PPO

## 2020-11-11 ENCOUNTER — Ambulatory Visit (INDEPENDENT_AMBULATORY_CARE_PROVIDER_SITE_OTHER): Payer: PPO

## 2020-11-11 DIAGNOSIS — I480 Paroxysmal atrial fibrillation: Secondary | ICD-10-CM | POA: Diagnosis not present

## 2020-11-11 LAB — CUP PACEART REMOTE DEVICE CHECK
Date Time Interrogation Session: 20220525010019
Implantable Pulse Generator Implant Date: 20190514

## 2020-11-20 ENCOUNTER — Telehealth (HOSPITAL_COMMUNITY): Payer: Self-pay | Admitting: *Deleted

## 2020-11-20 NOTE — Telephone Encounter (Signed)
Patient called in stating since 830pm she has noticed elevated HRs. Pt states she doesn't have symptoms of being in AF as she has in the past. Instructed pt to send transmission on LINQ to review rhythm. Pt will send now.

## 2020-11-20 NOTE — Telephone Encounter (Signed)
LINQ report reviewed by Adline Peals PA does confirm AF this morning HR in the 80s now.  BP 113/97.  Pt is asymptomatic just noticed elevated HR on her apple watch.  Will watch through weekend if still out Monday pt to call for assessment.

## 2020-11-25 ENCOUNTER — Other Ambulatory Visit: Payer: PPO

## 2020-11-25 NOTE — Telephone Encounter (Signed)
Spoke with pt in f/u to last weeks conversation.  Advised HR still looks very irregular but it is not fast as it previously was .  She reports she was diagnosed today with COVID and has already spoken with Af clinic regarding this today.  She is aware she cannot take anti-viral medication due to Tikosyn.

## 2020-11-26 DIAGNOSIS — I10 Essential (primary) hypertension: Secondary | ICD-10-CM | POA: Diagnosis not present

## 2020-11-26 DIAGNOSIS — N182 Chronic kidney disease, stage 2 (mild): Secondary | ICD-10-CM | POA: Diagnosis not present

## 2020-11-26 DIAGNOSIS — I5030 Unspecified diastolic (congestive) heart failure: Secondary | ICD-10-CM | POA: Diagnosis not present

## 2020-11-26 DIAGNOSIS — M8580 Other specified disorders of bone density and structure, unspecified site: Secondary | ICD-10-CM | POA: Diagnosis not present

## 2020-11-26 DIAGNOSIS — K219 Gastro-esophageal reflux disease without esophagitis: Secondary | ICD-10-CM | POA: Diagnosis not present

## 2020-11-26 DIAGNOSIS — I4891 Unspecified atrial fibrillation: Secondary | ICD-10-CM | POA: Diagnosis not present

## 2020-11-26 DIAGNOSIS — E782 Mixed hyperlipidemia: Secondary | ICD-10-CM | POA: Diagnosis not present

## 2020-12-03 NOTE — Progress Notes (Signed)
Carelink Summary Report / Loop Recorder 

## 2020-12-09 DIAGNOSIS — Z85828 Personal history of other malignant neoplasm of skin: Secondary | ICD-10-CM | POA: Diagnosis not present

## 2020-12-09 DIAGNOSIS — L570 Actinic keratosis: Secondary | ICD-10-CM | POA: Diagnosis not present

## 2020-12-14 ENCOUNTER — Ambulatory Visit (INDEPENDENT_AMBULATORY_CARE_PROVIDER_SITE_OTHER): Payer: PPO

## 2020-12-14 DIAGNOSIS — I480 Paroxysmal atrial fibrillation: Secondary | ICD-10-CM

## 2020-12-15 LAB — CUP PACEART REMOTE DEVICE CHECK
Date Time Interrogation Session: 20220627011239
Implantable Pulse Generator Implant Date: 20190514

## 2021-01-01 NOTE — Progress Notes (Signed)
Carelink Summary Report / Loop Recorder 

## 2021-01-16 LAB — CUP PACEART REMOTE DEVICE CHECK
Date Time Interrogation Session: 20220730011249
Implantable Pulse Generator Implant Date: 20190514

## 2021-01-18 ENCOUNTER — Ambulatory Visit (INDEPENDENT_AMBULATORY_CARE_PROVIDER_SITE_OTHER): Payer: PPO

## 2021-01-18 DIAGNOSIS — I480 Paroxysmal atrial fibrillation: Secondary | ICD-10-CM

## 2021-02-04 ENCOUNTER — Other Ambulatory Visit: Payer: Self-pay | Admitting: Nurse Practitioner

## 2021-02-07 DIAGNOSIS — N182 Chronic kidney disease, stage 2 (mild): Secondary | ICD-10-CM | POA: Diagnosis not present

## 2021-02-07 DIAGNOSIS — I4891 Unspecified atrial fibrillation: Secondary | ICD-10-CM | POA: Diagnosis not present

## 2021-02-07 DIAGNOSIS — M8580 Other specified disorders of bone density and structure, unspecified site: Secondary | ICD-10-CM | POA: Diagnosis not present

## 2021-02-07 DIAGNOSIS — K219 Gastro-esophageal reflux disease without esophagitis: Secondary | ICD-10-CM | POA: Diagnosis not present

## 2021-02-07 DIAGNOSIS — I10 Essential (primary) hypertension: Secondary | ICD-10-CM | POA: Diagnosis not present

## 2021-02-07 DIAGNOSIS — E782 Mixed hyperlipidemia: Secondary | ICD-10-CM | POA: Diagnosis not present

## 2021-02-07 DIAGNOSIS — I5030 Unspecified diastolic (congestive) heart failure: Secondary | ICD-10-CM | POA: Diagnosis not present

## 2021-02-11 NOTE — Progress Notes (Signed)
Carelink Summary Report / Loop Recorder 

## 2021-02-18 ENCOUNTER — Ambulatory Visit (INDEPENDENT_AMBULATORY_CARE_PROVIDER_SITE_OTHER): Payer: PPO

## 2021-02-18 ENCOUNTER — Telehealth (HOSPITAL_COMMUNITY): Payer: Self-pay | Admitting: *Deleted

## 2021-02-18 DIAGNOSIS — I480 Paroxysmal atrial fibrillation: Secondary | ICD-10-CM

## 2021-02-18 NOTE — Telephone Encounter (Signed)
Discussed transmission with patient. Reassured/educated. Instructed pt to take '10mg'$  of propranolol (pt states she did not like how PRN cardizem made her feel). She will call back this afternoon with update.

## 2021-02-18 NOTE — Telephone Encounter (Signed)
Transmission received and reviewed. Presenting rhythm appears AF 120's.

## 2021-02-18 NOTE — Telephone Encounter (Signed)
Pt still out of rhythm HR around 100. Feels ok just knows it is there. She will continue her current propanolol dose overnight and call with update tomorrow.

## 2021-02-18 NOTE — Telephone Encounter (Signed)
Pt called in stating she been having "a racing heart" since early this morning. She does not usually stay out of rhythm this long. It does not look like her LINQ updated this morning - she will send a transmission so we can see what rhythm she is currently in.  Will be in touch with pt once know rhythm. Current BP is 111/78 HR 88

## 2021-02-23 LAB — CUP PACEART REMOTE DEVICE CHECK
Date Time Interrogation Session: 20220901011309
Implantable Pulse Generator Implant Date: 20190514

## 2021-02-23 NOTE — Telephone Encounter (Signed)
Per device report pt is back in NSR.

## 2021-03-02 NOTE — Progress Notes (Signed)
Carelink Summary Report / Loop Recorder 

## 2021-03-03 ENCOUNTER — Other Ambulatory Visit: Payer: Self-pay

## 2021-03-03 ENCOUNTER — Other Ambulatory Visit (HOSPITAL_COMMUNITY): Payer: Self-pay | Admitting: *Deleted

## 2021-03-03 ENCOUNTER — Ambulatory Visit (HOSPITAL_COMMUNITY)
Admission: RE | Admit: 2021-03-03 | Discharge: 2021-03-03 | Disposition: A | Discharge status: Home / Self Care | Payer: PPO | Source: Ambulatory Visit | Attending: Nurse Practitioner | Admitting: Nurse Practitioner

## 2021-03-03 ENCOUNTER — Encounter (HOSPITAL_COMMUNITY): Payer: Self-pay | Admitting: Nurse Practitioner

## 2021-03-03 VITALS — BP 140/78 | HR 58 | Ht 67.0 in | Wt 157.6 lb

## 2021-03-03 DIAGNOSIS — I480 Paroxysmal atrial fibrillation: Secondary | ICD-10-CM | POA: Diagnosis not present

## 2021-03-03 DIAGNOSIS — Z79899 Other long term (current) drug therapy: Secondary | ICD-10-CM | POA: Insufficient documentation

## 2021-03-03 DIAGNOSIS — D6869 Other thrombophilia: Secondary | ICD-10-CM | POA: Diagnosis not present

## 2021-03-03 DIAGNOSIS — Z8616 Personal history of COVID-19: Secondary | ICD-10-CM | POA: Insufficient documentation

## 2021-03-03 DIAGNOSIS — I10 Essential (primary) hypertension: Secondary | ICD-10-CM | POA: Diagnosis not present

## 2021-03-03 DIAGNOSIS — Z7901 Long term (current) use of anticoagulants: Secondary | ICD-10-CM | POA: Diagnosis not present

## 2021-03-03 DIAGNOSIS — Z79891 Long term (current) use of opiate analgesic: Secondary | ICD-10-CM | POA: Insufficient documentation

## 2021-03-03 LAB — BASIC METABOLIC PANEL
Anion gap: 8 (ref 5–15)
BUN: 21 mg/dL (ref 8–23)
CO2: 29 mmol/L (ref 22–32)
Calcium: 9.6 mg/dL (ref 8.9–10.3)
Chloride: 103 mmol/L (ref 98–111)
Creatinine, Ser: 0.97 mg/dL (ref 0.44–1.00)
GFR, Estimated: 58 mL/min — ABNORMAL LOW (ref >60)
Glucose, Bld: 83 mg/dL (ref 70–99)
Potassium: 3.8 mmol/L (ref 3.5–5.1)
Sodium: 140 mmol/L (ref 135–145)

## 2021-03-03 LAB — CBC
HCT: 37.8 % (ref 36.0–46.0)
Hemoglobin: 12.4 g/dL (ref 12.0–15.0)
MCH: 31.8 pg (ref 26.0–34.0)
MCHC: 32.8 g/dL (ref 30.0–36.0)
MCV: 96.9 fL (ref 80.0–100.0)
Platelets: 207 10*3/uL (ref 150–400)
RBC: 3.9 MIL/uL (ref 3.87–5.11)
RDW: 12.5 % (ref 11.5–15.5)
WBC: 5.8 10*3/uL (ref 4.0–10.5)
nRBC: 0 % (ref 0.0–0.2)

## 2021-03-03 LAB — TSH: TSH: 1.963 u[IU]/mL (ref 0.350–4.500)

## 2021-03-03 LAB — MAGNESIUM: Magnesium: 2 mg/dL (ref 1.7–2.4)

## 2021-03-03 MED ORDER — POTASSIUM CHLORIDE ER 10 MEQ PO TBCR: 10.0000 meq | EXTENDED_RELEASE_TABLET | Freq: Every day | ORAL | 3 refills | Status: DC

## 2021-03-03 NOTE — Progress Notes (Signed)
Primary Care Physician: Christy Low, MD Referring Physician: Dr. Caprice Renshaw Christy [name] is a 80 y.o. female with a h/o atrial fib and PE/DVT 01/2016. She is has been treated with Rythmol and Tikosyn in the past. When her burden increased on Tikosyn, it was decided to pursue ablation 02/26/17.   She is in the afib clinic today, 04/10/19, for f/u of Tikosyn past ablation. She feels good. She has been staying in [place]. She has a linq, has intermittent palpitations but per Dr. Jackalyn Lombard note disorganized atrial activity, but not afib. She is leaving for [place] in Feb/March,2021 for a couple of months. Continues on eliquis 5 mg bid for a CHA2DS2VASc score of 5, no bleeding issues.  F/u in afib clinic, 07/18/19. She reports that she had Covid 19 the earlier part of the month while she and her husband were at the beach. They believe the exposure was from  2 furniture  delivery men  that were in their house for awhile, as they did not come into contact with other people and did not eat in restaurants.  She  and her husband both came down with it and received the antibody infusion at [facility]. She overall felt better after that and she feels their symptoms were fairly mild. She did not have any afib while she was sick with covid. Continues on tikosyn and eliquis with a CHA2DS2VASc score of 5.  F/u in afib clinic, 08/27/19, pt called to office saying that she is seeing HR's by BP cuff in the 40's at home even though she feels well. We ran a Link report that shows PAC's as well as episodes of afib that usually  last around mins to one hour. Pt does not feel these episodes. Her longest afib episode was 50 mins on 2/26 and 2/27 with v rates 120-150, again pt did not feel this. EKG today shows SR with pac's. She feels well today.  F/u in afib clinic, 08/27/20. Afib has been quiet recently. She saw Dr. Rayann Heman 06/01/21 and by his assessment then, he did not feel she was a repeat ablation candidate. She continues on  tikosyn. No bleeding issues with eliquis with a CHA2DS2VASc score of 5.   F/u in afib clinic, 03/03/21. She remains on Tikosyn. One episode of afib that lasted around 24 hours and resolved on its own. She has an apple watch now so we discussed how she can track this as well as with her Linq. Last Linq report in September did not show any arrhythmia. Continues on eliquis 5 mg bid for a CHA2DS2VASc  score of 5.  Today, she denies symptoms of palpitations, chest pain, shortness of breath, orthopnea, PND, lower extremity edema, dizziness, presyncope, syncope, or neurologic sequela. The patient is tolerating medications without difficulties and is otherwise without complaint today.   Past Medical History:  Diagnosis Date   Atrial fibrillation ([place])    Bilateral lower extremity edema    Chronic, likely related to venous stasis    DJD (degenerative joint disease), lumbosacral    Dyslipidemia    Essential hypertension    "dx'd recently" (10/06/2016)   GERD (gastroesophageal reflux disease)    History of hepatitis C virus infection ~ 1997   In remission   Lung granuloma ([place]) 2004    CT scan   Osteopenia    Paroxysmal atrial tachycardia (H[facility])    PE (pulmonary embolism) 01/2016   Spondylolisthesis    Status post epidural injections 3 by neurosurgery  -  Dr. Joya Salm    Varicose veins of both legs with edema    Vitamin D deficiency    Past Surgical History:  Procedure Laterality Date   ATRIAL FIBRILLATION ABLATION N/A 03/09/2017   Procedure: Atrial Fibrillation Ablation;  Surgeon: [name], MD;  Location: [place] [facility];  Service: Cardiovascular;  Laterality: N/A;   BACK SURGERY     CARDIOVERSION N/A 03/21/2016   Procedure: CARDIOVERSION;  Surgeon: [name], MD;  Location: [place];  Service: Cardiovascular;  Laterality: N/A;   COMBINED HYSTEROSCOPY DIAGNOSTIC / D&C  2005   COMBINED HYSTEROSCOPY DIAGNOSTIC / D&C  1999   DILATION AND CURETTAGE OF UTERUS     LOOP RECORDER  INSERTION N/A 10/31/2017   Procedure: LOOP RECORDER INSERTION;  Surgeon: [name], MD;  Location: [place] [facility];  Service: Cardiovascular;  Laterality: N/A;   [place]   "went in front and back; broken neck"   TEE WITHOUT CARDIOVERSION N/A 03/08/2017   Procedure: TRANSESOPHAGEAL ECHOCARDIOGRAM (TEE);  Surgeon: Josue Hector, MD;  Location: [facility];  Service: Cardiovascular;  Laterality: N/A;   TOE SURGERY Right    replaced joint   TONSILLECTOMY     TUBAL LIGATION Bilateral 1976   VAGINAL DELIVERY     x2    Current Outpatient Medications  Medication Sig Dispense Refill   apixaban (ELIQUIS) 5 MG TABS tablet TAKE 1 TABLET BY MOUTH TWICE DAILY(START 02/03/2016) 180 tablet 3   Ascorbic Acid (VITAMIN C) 1000 MG tablet Take 1,000 mg by mouth daily.     betamethasone valerate (VALISONE) 0.1 % cream APPLY TO VULVA BID PRN     cholecalciferol (VITAMIN D3) 25 MCG (1000 UNIT) tablet Take 1,000 Units by mouth daily.     diltiazem (CARDIZEM) 30 MG tablet Take 1 tablet every 4 hours AS NEEDED for AFIB heart rate >100 45 tablet 1   dofetilide (TIKOSYN) 250 MCG capsule TAKE 1 CAPSULE BY MOUTH TWICE DAILY IN THE MORNING AND DINNER FOR HEART 180 capsule 1   furosemide (LASIX) 20 MG tablet Take 1 tablet (20 mg total) by mouth daily as needed for fluid or edema (weight gain of 3 lbs in 24 hrs or 5 lbs in 1 week). 30 tablet 3   loratadine (CLARITIN) 10 MG tablet Take 10 mg by mouth daily.     losartan (COZAAR) 25 MG tablet Take 25 mg by mouth daily.     Magnesium 250 MG TABS Take 250 mg by mouth daily.     multivitamin-iron-minerals-folic acid (CENTRUM) chewable tablet Chew 1 tablet by mouth daily.     pantoprazole (PROTONIX) 40 MG tablet Take 40 mg by mouth daily.     propranolol (INDERAL) 20 MG tablet Take 1 tablet (20 mg total) by mouth 2 (two) times daily. 180 tablet 3   No current facility-administered medications for this encounter.    Allergies  Allergen Reactions    Caffeine     Heart palpitations   Epinephrine Palpitations    Social History   Socioeconomic History   Marital status: Married    Spouse name: Not on file   Number of children: Not on file   Years of education: Not on file   Highest education level: Not on file  Occupational History   Not on file  Tobacco Use   Smoking status: Never   Smokeless tobacco: Never  Vaping Use   Vaping Use: Never used  Substance and Sexual Activity   Alcohol use: Yes  Alcohol/week: 14.0 standard drinks    Types: 14 Glasses of wine per week    Comment: 10/06/2016 "wine daily"   Drug use: No   Sexual activity: Yes    Birth control/protection: Other-see comments    Comment: tubal ligation  Other Topics Concern   Not on file  Social History Narrative   She is a married mother of 1. Lives with her husband. 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 She has never smoked and drinks up to 10 glasses of wine or so week.   She usually exercises roughly 3 days a week doing yoga and plays golf. She is mostly limited by her "time constraints "   Social Determinants of Health   Financial Resource Strain: Not on file  Food Insecurity: Not on file  Transportation Needs: Not on file  Physical Activity: Not on file  Stress: Not on file  Social Connections: Not on file  Intimate Partner Violence: Not on file    Family History  Problem Relation Age of Onset   Heart Problems Mother    CVA Mother    Heart attack Mother 69   CVA Father    Colon cancer Neg Hx    Clotting disorder Neg Hx     ROS- All systems are reviewed and negative except as per the HPI above  Physical Exam: Vitals:   03/03/21 0955  BP: 140/78  Pulse: (!) 58  SpO2: 99%  Weight: 71.5 kg  Height: '5\' 7"'$  (1.702 m)   Wt Readings from Last 3 Encounters:  03/03/21 71.5 kg  08/26/20 72.9 kg  04/06/20 71.6 kg    Labs: Lab Results  Component Value Date   NA 138 08/26/2020   K 4.4 08/26/2020   CL 104 08/26/2020   CO2 26  08/26/2020   GLUCOSE 105 (H) 08/26/2020   BUN 17 08/26/2020   CREATININE 0.88 08/26/2020   CALCIUM 9.4 08/26/2020   MG 2.1 08/26/2020   Lab Results  Component Value Date   INR 1.1 03/17/2016   No results found for: CHOL, HDL, LDLCALC, TRIG   GEN- The patient is well appearing, alert and oriented x 3 today.   Head- normocephalic, atraumatic Eyes-  Sclera clear, conjunctiva pink Ears- hearing intact Oropharynx- clear Neck- supple, no JVP Lymph- no cervical lymphadenopathy Lungs- Clear to ausculation bilaterally, normal work of breathing Heart- Regular rate and rhythm, no murmurs, rubs or gallops, PMI not laterally displaced GI- soft, NT, ND, + BS Extremities- no clubbing, cyanosis, or edema MS- no significant deformity or atrophy Skin- no rash or lesion Psych- euthymic mood, full affect Neuro- strength and sensation are intact  EKG-Sinus Loletha Grayer with  first degree AV block at 58 bpm, with  Pr int 242 ms, qrs int 84 ms, qtc 461  ms( stable)     Assessment and Plan: 1. Paroxysmal atrial fibrillation S/p ablation 02/2017  Staying in SR, afib breakthrough rare Will continue  to monitor through Linq   Discussed how to track EKG thru apple watch  Continue dofetilide 250 mcg bid/BB without change Continue eliquis 5 mg bid with chadsvasc score of 5 Bmet/mag today  2. HTN Stable  F/u with  afib clinic in 6  months    Butch Penny [address]. [address], [facility] [address] [place], [place] [address] [phone]

## 2021-03-12 DIAGNOSIS — M25562 Pain in left knee: Secondary | ICD-10-CM | POA: Diagnosis not present

## 2021-03-12 DIAGNOSIS — M25561 Pain in right knee: Secondary | ICD-10-CM | POA: Diagnosis not present

## 2021-03-19 ENCOUNTER — Other Ambulatory Visit: Payer: Self-pay

## 2021-03-19 ENCOUNTER — Ambulatory Visit (HOSPITAL_COMMUNITY)
Admission: RE | Admit: 2021-03-19 | Discharge: 2021-03-19 | Disposition: A | Discharge status: Home / Self Care | Payer: PPO | Source: Ambulatory Visit | Attending: Physician Assistant | Admitting: Physician Assistant

## 2021-03-19 DIAGNOSIS — D6869 Other thrombophilia: Secondary | ICD-10-CM | POA: Diagnosis not present

## 2021-03-19 DIAGNOSIS — I480 Paroxysmal atrial fibrillation: Secondary | ICD-10-CM | POA: Insufficient documentation

## 2021-03-19 LAB — BASIC METABOLIC PANEL
Anion gap: 5 (ref 5–15)
BUN: 28 mg/dL — ABNORMAL HIGH (ref 8–23)
CO2: 27 mmol/L (ref 22–32)
Calcium: 9.2 mg/dL (ref 8.9–10.3)
Chloride: 106 mmol/L (ref 98–111)
Creatinine, Ser: 0.92 mg/dL (ref 0.44–1.00)
GFR, Estimated: >60 mL/min (ref >60)
Glucose, Bld: 85 mg/dL (ref 70–99)
Potassium: 4.6 mmol/L (ref 3.5–5.1)
Sodium: 138 mmol/L (ref 135–145)

## 2021-03-25 LAB — CUP PACEART REMOTE DEVICE CHECK
Date Time Interrogation Session: 20221004011616
Implantable Pulse Generator Implant Date: 20190514

## 2021-03-29 ENCOUNTER — Ambulatory Visit (INDEPENDENT_AMBULATORY_CARE_PROVIDER_SITE_OTHER): Payer: PPO

## 2021-03-29 DIAGNOSIS — I480 Paroxysmal atrial fibrillation: Secondary | ICD-10-CM | POA: Diagnosis not present

## 2021-04-07 NOTE — Progress Notes (Signed)
Carelink Summary Report / Loop Recorder 

## 2021-04-12 ENCOUNTER — Ambulatory Visit: Payer: PPO | Admitting: Internal Medicine

## 2021-04-12 ENCOUNTER — Other Ambulatory Visit: Payer: Self-pay

## 2021-04-12 VITALS — BP 122/74 | HR 59 | Ht 67.5 in | Wt 159.6 lb

## 2021-04-12 DIAGNOSIS — I484 Atypical atrial flutter: Secondary | ICD-10-CM

## 2021-04-12 DIAGNOSIS — I10 Essential (primary) hypertension: Secondary | ICD-10-CM | POA: Diagnosis not present

## 2021-04-12 DIAGNOSIS — I471 Supraventricular tachycardia: Secondary | ICD-10-CM

## 2021-04-12 DIAGNOSIS — I480 Paroxysmal atrial fibrillation: Secondary | ICD-10-CM

## 2021-04-12 DIAGNOSIS — I340 Nonrheumatic mitral (valve) insufficiency: Secondary | ICD-10-CM | POA: Diagnosis not present

## 2021-04-12 NOTE — Patient Instructions (Addendum)
Medication Instructions:  Your physician recommends that you continue on your current medications as directed. Please refer to the Current Medication list given to you today. *If you need a refill on your cardiac medications before your next appointment, please call your pharmacy*  Lab Work: None. If you have labs (blood work) drawn today and your tests are completely normal, you will receive your results only by: [place] (if you have MyChart) OR A paper copy in the mail If you have any lab test that is abnormal or we need to change your treatment, we will call you to review the results.  Testing/Procedures: Your physician has requested that you have an echocardiogram. Echocardiography is a painless test that uses sound waves to create images of your heart. It provides your doctor with information about the size and shape of your heart and how well your heart's chambers and valves are working. This procedure takes approximately one hour. There are no restrictions for this procedure.   Follow-Up: At [facility], you and your health needs are our priority.  As part of our continuing mission to provide you with exceptional heart care, we have created designated Provider Care Teams.  These Care Teams include your primary Cardiologist (physician) and Advanced Practice Providers (APPs -  Physician Assistants and Nurse Practitioners) who all work together to provide you with the care you need, when you need it.  Your physician wants you to follow-up in: 6 months with the Afib Clinic. They will contact you to schedule.  We recommend signing up for the patient portal called "MyChart".  Sign up information is provided on this After Visit Summary.  MyChart is used to connect with patients for Virtual Visits (Telemedicine).  Patients are able to view lab/test results, encounter notes, upcoming appointments, etc.  Non-urgent messages can be sent to your provider as well.   To learn more about what  you can do with MyChart, go to [email or web address].    Any Other Special Instructions Will Be Listed Below (If Applicable).

## 2021-04-12 NOTE — Progress Notes (Signed)
PCP: Wenda Low, MD   Primary EP: Dr Richard Miu is a 80 y.o. female who presents today for routine electrophysiology followup.  Since last being seen in our clinic, the patient reports doing reasonably well.   She worries a lot about afib.  She  notices elevated HR at nights and has difficulty sleeping due to worry about afib.  Today, she denies symptoms of chest pain, shortness of breath,  lower extremity edema, dizziness, presyncope, or syncope.  The patient is otherwise without complaint today.   Past Medical History:  Diagnosis Date   Atrial fibrillation ([place])    Bilateral lower extremity edema    Chronic, likely related to venous stasis    DJD (degenerative joint disease), lumbosacral    Dyslipidemia    Essential hypertension    "dx'd recently" (10/06/2016)   GERD (gastroesophageal reflux disease)    History of hepatitis C virus infection ~ 1997   In remission   Lung granuloma ([place]) 2004    CT scan   Osteopenia    Paroxysmal atrial tachycardia (HCC)    PE (pulmonary embolism) 01/2016   Spondylolisthesis    Status post epidural injections 3 by neurosurgery  - Dr. Joya Salm    Varicose veins of both legs with edema    Vitamin D deficiency    Past Surgical History:  Procedure Laterality Date   ATRIAL FIBRILLATION ABLATION N/A 03/09/2017   Procedure: Atrial Fibrillation Ablation;  Surgeon: [name], MD;  Location: [place] [facility];  Service: Cardiovascular;  Laterality: N/A;   BACK SURGERY     CARDIOVERSION N/A 03/21/2016   Procedure: CARDIOVERSION;  Surgeon: [name], MD;  Location: [place];  Service: Cardiovascular;  Laterality: N/A;   COMBINED HYSTEROSCOPY DIAGNOSTIC / D&C  2005   COMBINED HYSTEROSCOPY DIAGNOSTIC / D&C  1999   DILATION AND CURETTAGE OF UTERUS     LOOP RECORDER INSERTION N/A 10/31/2017   Procedure: LOOP RECORDER INSERTION;  Surgeon: [name], MD;  Location: [place] [facility];  Service: Cardiovascular;  Laterality:  N/A;   [place]   "went in front and back; broken neck"   TEE WITHOUT CARDIOVERSION N/A 03/08/2017   Procedure: TRANSESOPHAGEAL ECHOCARDIOGRAM (TEE);  Surgeon: Josue Hector, MD;  Location: [facility]COPY;  Service: Cardiovascular;  Laterality: N/A;   TOE SURGERY Right    replaced joint   TONSILLECTOMY     TUBAL LIGATION Bilateral 1976   VAGINAL DELIVERY     x2    ROS- all systems are reviewed and negatives except as per HPI above  Current Outpatient Medications  Medication Sig Dispense Refill   apixaban (ELIQUIS) 5 MG TABS tablet TAKE 1 TABLET BY MOUTH TWICE DAILY(START 02/03/2016) 180 tablet 3   Ascorbic Acid (VITAMIN C) 1000 MG tablet Take 1,000 mg by mouth daily.     betamethasone valerate (VALISONE) 0.1 % cream APPLY TO VULVA BID PRN     cholecalciferol (VITAMIN D3) 25 MCG (1000 UNIT) tablet Take 1,000 Units by mouth daily.     diltiazem (CARDIZEM) 30 MG tablet Take 1 tablet every 4 hours AS NEEDED for AFIB heart rate >100 45 tablet 1   dofetilide (TIKOSYN) 250 MCG capsule TAKE 1 CAPSULE BY MOUTH TWICE DAILY IN THE MORNING AND DINNER FOR HEART 180 capsule 1   furosemide (LASIX) 20 MG tablet Take 1 tablet (20 mg total) by mouth daily as needed for fluid or edema (weight gain of 3 lbs in 24 hrs  or 5 lbs in 1 week). 30 tablet 3   loratadine (CLARITIN) 10 MG tablet Take 10 mg by mouth daily.     losartan (COZAAR) 25 MG tablet Take 25 mg by mouth daily.     Magnesium 250 MG TABS Take 250 mg by mouth daily.     multivitamin-iron-minerals-folic acid (CENTRUM) chewable tablet Chew 1 tablet by mouth daily.     pantoprazole (PROTONIX) 40 MG tablet Take 40 mg by mouth daily.     potassium chloride (KLOR-CON) 10 MEQ tablet Take 1 tablet (10 mEq total) by mouth daily. 30 tablet 3   propranolol (INDERAL) 20 MG tablet Take 1 tablet (20 mg total) by mouth 2 (two) times daily. 180 tablet 3   No current facility-administered medications for this visit.    Physical Exam: Vitals:    04/12/21 1142  BP: 122/74  Pulse: (!) 59  SpO2: 96%  Weight: 159 lb 9.6 oz (72.4 kg)  Height: 5' 7.5" (1.715 m)    GEN- The patient is well appearing, alert and oriented x 3 today.   Head- normocephalic, atraumatic Eyes-  Sclera clear, conjunctiva pink Ears- hearing intact Oropharynx- clear Lungs- Clear to ausculation bilaterally, normal work of breathing Heart- Regular rate and rhythm, no murmurs, rubs or gallops, PMI not laterally displaced GI- soft, NT, ND, + BS Extremities- no clubbing, cyanosis, or edema  Wt Readings from Last 3 Encounters:  04/12/21 159 lb 9.6 oz (72.4 kg)  03/03/21 157 lb 9.6 oz (71.5 kg)  08/26/20 160 lb 12.8 oz (72.9 kg)    EKG tracing ordered today is personally reviewed and shows sinus rhythm 59 bpm, PR 256 msec, Qtc 449 msec  Assessment and Plan:  Paroxysmal atrial fibrillation/ atach/ multiple atypical atrial flutter circuits Doing well post ablation with tikosyn despite severe biatrial enlargement.   AF burden by ILR is 0.6%. I am amazed that she has done this well! Continue tikosyn Continue eliquis Bmet 03/19/21 reviewed  2. HTN Stable No change required today  3. MR Mild to moderate by prior echo Update echo  4. Anxiety She worries about afib.  Her concern is out of proportion to her AF burden.  I will refer to Dr Elias Else for further discussion  Follow-up in the AF clinic in 6 months  [name] MD, [facility] 04/12/2021 12:07 PM

## 2021-04-29 LAB — CUP PACEART REMOTE DEVICE CHECK
Date Time Interrogation Session: 20221106012033
Implantable Pulse Generator Implant Date: 20190514

## 2021-04-30 ENCOUNTER — Other Ambulatory Visit: Payer: Self-pay

## 2021-04-30 ENCOUNTER — Ambulatory Visit (HOSPITAL_COMMUNITY): Payer: PPO | Attending: Cardiology

## 2021-04-30 DIAGNOSIS — I10 Essential (primary) hypertension: Secondary | ICD-10-CM

## 2021-04-30 DIAGNOSIS — I484 Atypical atrial flutter: Secondary | ICD-10-CM | POA: Diagnosis not present

## 2021-04-30 DIAGNOSIS — I340 Nonrheumatic mitral (valve) insufficiency: Secondary | ICD-10-CM

## 2021-04-30 DIAGNOSIS — I471 Supraventricular tachycardia: Secondary | ICD-10-CM | POA: Insufficient documentation

## 2021-04-30 DIAGNOSIS — I480 Paroxysmal atrial fibrillation: Secondary | ICD-10-CM | POA: Diagnosis not present

## 2021-04-30 DIAGNOSIS — I4719 Other supraventricular tachycardia: Secondary | ICD-10-CM

## 2021-04-30 LAB — ECHOCARDIOGRAM COMPLETE
Area-P 1/2: 3.83 cm2
S' Lateral: 2.7 cm

## 2021-05-03 ENCOUNTER — Ambulatory Visit (INDEPENDENT_AMBULATORY_CARE_PROVIDER_SITE_OTHER): Payer: PPO

## 2021-05-03 ENCOUNTER — Ambulatory Visit: Payer: PPO | Admitting: Psychologist

## 2021-05-03 DIAGNOSIS — I480 Paroxysmal atrial fibrillation: Secondary | ICD-10-CM | POA: Diagnosis not present

## 2021-05-11 NOTE — Progress Notes (Signed)
Carelink Summary Report / Loop Recorder 

## 2021-05-24 ENCOUNTER — Other Ambulatory Visit (HOSPITAL_COMMUNITY): Payer: Self-pay | Admitting: *Deleted

## 2021-05-24 MED ORDER — POTASSIUM CHLORIDE ER 10 MEQ PO TBCR: 10.0000 meq | EXTENDED_RELEASE_TABLET | Freq: Every day | ORAL | 6 refills | Status: DC

## 2021-06-07 ENCOUNTER — Ambulatory Visit (INDEPENDENT_AMBULATORY_CARE_PROVIDER_SITE_OTHER): Payer: PPO

## 2021-06-07 DIAGNOSIS — I480 Paroxysmal atrial fibrillation: Secondary | ICD-10-CM

## 2021-06-07 LAB — CUP PACEART REMOTE DEVICE CHECK
Date Time Interrogation Session: 20221217231226
Implantable Pulse Generator Implant Date: 20190514

## 2021-06-16 NOTE — Progress Notes (Signed)
Carelink Summary Report / Loop Recorder 

## 2021-07-07 DIAGNOSIS — L821 Other seborrheic keratosis: Secondary | ICD-10-CM | POA: Diagnosis not present

## 2021-07-07 DIAGNOSIS — L814 Other melanin hyperpigmentation: Secondary | ICD-10-CM | POA: Diagnosis not present

## 2021-07-07 DIAGNOSIS — L570 Actinic keratosis: Secondary | ICD-10-CM | POA: Diagnosis not present

## 2021-07-07 DIAGNOSIS — Z85828 Personal history of other malignant neoplasm of skin: Secondary | ICD-10-CM | POA: Diagnosis not present

## 2021-07-08 DIAGNOSIS — Z1231 Encounter for screening mammogram for malignant neoplasm of breast: Secondary | ICD-10-CM | POA: Diagnosis not present

## 2021-07-15 DIAGNOSIS — R922 Inconclusive mammogram: Secondary | ICD-10-CM | POA: Diagnosis not present

## 2021-07-15 DIAGNOSIS — R928 Other abnormal and inconclusive findings on diagnostic imaging of breast: Secondary | ICD-10-CM | POA: Diagnosis not present

## 2021-07-15 DIAGNOSIS — N6315 Unspecified lump in the right breast, overlapping quadrants: Secondary | ICD-10-CM | POA: Diagnosis not present

## 2021-07-20 ENCOUNTER — Other Ambulatory Visit: Payer: Self-pay | Admitting: Radiology

## 2021-07-20 DIAGNOSIS — C50812 Malignant neoplasm of overlapping sites of left female breast: Secondary | ICD-10-CM | POA: Diagnosis not present

## 2021-07-22 ENCOUNTER — Telehealth: Payer: Self-pay | Admitting: Hematology

## 2021-07-22 ENCOUNTER — Encounter: Payer: Self-pay | Admitting: *Deleted

## 2021-07-22 NOTE — Telephone Encounter (Signed)
Spoke to patient to confirm afternoon appointment for 2/8, [facility] will send packet

## 2021-07-26 ENCOUNTER — Other Ambulatory Visit: Payer: Self-pay | Admitting: *Deleted

## 2021-07-26 DIAGNOSIS — C50412 Malignant neoplasm of upper-outer quadrant of left female breast: Secondary | ICD-10-CM

## 2021-07-26 DIAGNOSIS — Z170 Estrogen receptor positive status [ER+]: Secondary | ICD-10-CM

## 2021-07-26 NOTE — Progress Notes (Signed)
Radiation Oncology         ([phone]) [phone] ________________________________  Name: Christy [name]        MRN: [id number]  Date of Service: 07/28/2021 DOB: [birth year]  MC:NOBSJG, Denton Ar, MD  Donnie Mesa, MD     REFERRING PHYSICIAN: Donnie Mesa, MD   DIAGNOSIS: The encounter diagnosis was Malignant neoplasm of upper-outer quadrant of left breast in female, estrogen receptor positive ([place]).   HISTORY OF PRESENT ILLNESS: Christy [name] is a 81 y.o. female seen in the multidisciplinary breast clinic for a new diagnosis of left breast cancer. The patient was noted to have a screening detected mass in the left breast. Diagnostic imaging showed a 1.4 cm mass in the 12:00 position. Her left axilla was negative for adenopathy. She underwent a biopsy on 07/20/21 that showed grade 1-2 invasive ductal carcinoma that was ER/PR positive, HER2 was negative. Ki 67 was 5%. She is seen to discuss treatment recommendations of her cancer.  PREVIOUS RADIATION THERAPY: No   PAST MEDICAL HISTORY:  Past Medical History:  Diagnosis Date   Atrial fibrillation ([place])    Bilateral lower extremity edema    Chronic, likely related to venous stasis    DJD (degenerative joint disease), lumbosacral    Dyslipidemia    Essential hypertension    "dx'd recently" (10/06/2016)   GERD (gastroesophageal reflux disease)    History of hepatitis C virus infection ~ 1997   In remission   Lung granuloma ([place]) 2004    CT scan   Osteopenia    Paroxysmal atrial tachycardia (HCC)    PE (pulmonary embolism) 01/2016   Spondylolisthesis    Status post epidural injections 3 by neurosurgery  - Dr. Joya Salm    Varicose veins of both legs with edema    Vitamin D deficiency        PAST SURGICAL HISTORY: Past Surgical History:  Procedure Laterality Date   ATRIAL FIBRILLATION ABLATION N/A 03/09/2017   Procedure: Atrial Fibrillation Ablation;  Surgeon: [name], MD;  Location: [facility];  Service: Cardiovascular;   Laterality: N/A;   BACK SURGERY     CARDIOVERSION N/A 03/21/2016   Procedure: CARDIOVERSION;  Surgeon: [name], MD;  Location: [facility];  Service: Cardiovascular;  Laterality: N/A;   COMBINED HYSTEROSCOPY DIAGNOSTIC / D&C  2005   COMBINED HYSTEROSCOPY DIAGNOSTIC / D&C  1999   DILATION AND CURETTAGE OF UTERUS     LOOP RECORDER INSERTION N/A 10/31/2017   Procedure: LOOP RECORDER INSERTION;  Surgeon: [name], MD;  Location: [facility];  Service: Cardiovascular;  Laterality: N/A;   [place]   "went in front and back; broken neck"   TEE WITHOUT CARDIOVERSION N/A 03/08/2017   Procedure: TRANSESOPHAGEAL ECHOCARDIOGRAM (TEE);  Surgeon: Josue Hector, MD;  Location: [facility] ENDOSCOPY;  Service: Cardiovascular;  Laterality: N/A;   TOE SURGERY Right    replaced joint   TONSILLECTOMY     TUBAL LIGATION Bilateral 1976   VAGINAL DELIVERY     x2     FAMILY HISTORY:  Family History  Problem Relation Age of Onset   Heart Problems Mother    CVA Mother    Heart attack Mother 44   CVA Father    Colon cancer Neg Hx    Clotting disorder Neg Hx      SOCIAL HISTORY:  reports that she has never smoked. She has never used smokeless tobacco. She reports current alcohol use of about 14.0 standard drinks  per week. She reports that she does not use drugs.  The patient is married and lives in [place].  She is married and lives in [place]. She is a retired Designer, jewellery. She enjoys spending time with friends.    ALLERGIES: Caffeine and Epinephrine   MEDICATIONS:  Current Outpatient Medications  Medication Sig Dispense Refill   apixaban (ELIQUIS) 5 MG TABS tablet TAKE 1 TABLET BY MOUTH TWICE DAILY(START 02/03/2016) 180 tablet 3   Ascorbic Acid (VITAMIN C) 1000 MG tablet Take 1,000 mg by mouth daily.     betamethasone valerate (VALISONE) 0.1 % cream APPLY TO VULVA BID PRN     cholecalciferol (VITAMIN D3) 25 MCG (1000 UNIT) tablet Take 1,000 Units by mouth  daily.     diltiazem (CARDIZEM) 30 MG tablet Take 1 tablet every 4 hours AS NEEDED for AFIB heart rate >100 45 tablet 1   dofetilide (TIKOSYN) 250 MCG capsule TAKE 1 CAPSULE BY MOUTH TWICE DAILY IN THE MORNING AND DINNER FOR HEART 180 capsule 1   furosemide (LASIX) 20 MG tablet Take 1 tablet (20 mg total) by mouth daily as needed for fluid or edema (weight gain of 3 lbs in 24 hrs or 5 lbs in 1 week). 30 tablet 3   loratadine (CLARITIN) 10 MG tablet Take 10 mg by mouth daily.     losartan (COZAAR) 25 MG tablet Take 25 mg by mouth daily.     Magnesium 250 MG TABS Take 250 mg by mouth daily.     multivitamin-iron-minerals-folic acid (CENTRUM) chewable tablet Chew 1 tablet by mouth daily.     pantoprazole (PROTONIX) 40 MG tablet Take 40 mg by mouth daily.     potassium chloride (KLOR-CON) 10 MEQ tablet Take 1 tablet (10 mEq total) by mouth daily. 30 tablet 6   propranolol (INDERAL) 20 MG tablet Take 1 tablet (20 mg total) by mouth 2 (two) times daily. 180 tablet 3   No current facility-administered medications for this visit.     REVIEW OF SYSTEMS: On review of systems, the patient reports that she is doing well overall. No specific breast complaints are noted.      PHYSICAL EXAM:  Wt Readings from Last 3 Encounters:  04/12/21 159 lb 9.6 oz (72.4 kg)  03/03/21 157 lb 9.6 oz (71.5 kg)  08/26/20 160 lb 12.8 oz (72.9 kg)   Temp Readings from Last 3 Encounters:  07/03/19 98.1 F (36.7 C) (Oral)  10/05/18 97.7 F (36.5 C) (Oral)  10/31/17 (!) 97.2 F (36.2 C) (Oral)   BP Readings from Last 3 Encounters:  04/12/21 122/74  03/03/21 140/78  08/26/20 (!) 142/78   Pulse Readings from Last 3 Encounters:  04/12/21 (!) 59  03/03/21 (!) 58  08/26/20 (!) 56    In general this is a well appearing Caucasian female in no acute distress. She's alert and oriented x4 and appropriate throughout the examination. Cardiopulmonary assessment is negative for acute distress and she exhibits normal  effort. Bilateral breast exam is deferred.    ECOG = 0  0 - Asymptomatic (Fully active, able to carry on all predisease activities without restriction)  1 - Symptomatic but completely ambulatory (Restricted in physically strenuous activity but ambulatory and able to carry out work of a light or sedentary nature. For example, light housework, office work)  2 - Symptomatic, <50% in bed during the day (Ambulatory and capable of all self care but unable to carry out any work activities. Up and about more than 50%  of waking hours)  3 - Symptomatic, >50% in bed, but not bedbound (Capable of only limited self-care, confined to bed or chair 50% or more of waking hours)  4 - Bedbound (Completely disabled. Cannot carry on any self-care. Totally confined to bed or chair)  5 - Death   Eustace Pen MM, Creech RH, Tormey DC, et al. [phone]). "Toxicity and response criteria of the [facility]". [place] Oncol. 5 (6): 649-55    LABORATORY DATA:  Lab Results  Component Value Date   WBC 5.8 03/03/2021   HGB 12.4 03/03/2021   HCT 37.8 03/03/2021   MCV 96.9 03/03/2021   PLT 207 03/03/2021   Lab Results  Component Value Date   NA 138 03/19/2021   K 4.6 03/19/2021   CL 106 03/19/2021   CO2 27 03/19/2021   Lab Results  Component Value Date   ALT 16 02/05/2016   AST 19 02/05/2016   ALKPHOS 51 02/05/2016   BILITOT 0.6 02/05/2016      RADIOGRAPHY: No results found.     IMPRESSION/PLAN: 1. Stage IA, [id number], grade 1-2, ER/PR positive invasive ductal carcinoma of the left breast. Dr. Lisbeth Renshaw discusses the pathology findings and reviews the nature of early stage breast disease. The consensus from the breast conference includes breast conservation with lumpectomy. Dr. Lisbeth Renshaw reviews the external radiotherapy to the breast  to reduce risks of local recurrence followed by antiestrogen therapy. Dr. Lisbeth Renshaw also discusses cases in which radiation may be optional for favorable cases  based on final pathology. We discussed the risks, benefits, short, and long term effects of radiotherapy, as well as the curative intent, and the patient is interested in proceeding. Dr. Lisbeth Renshaw discusses the delivery and logistics of radiotherapy and anticipates a course of 4  weeks of radiotherapy to the left breast with deep inspiration breath hold technique. We will see her back a few weeks after surgery to discuss the simulation process and discuss options of radiotherapy about 4-6 weeks after surgery.  2. Possible genetic predisposition to malignancy. The patient is a candidate to discuss genetic testing given her personal  history. She was offered referral and will meet the genetics team today.    In a visit lasting 60 minutes, greater than 50% of the time was spent face to face reviewing her case, as well as in preparation of, discussing, and coordinating the patient's care.  The above documentation reflects my direct findings during this shared patient visit. Please see the separate note by Dr. Lisbeth Renshaw on this date for the remainder of the patient's plan of care.    Carola Rhine, [facility]    **Disclaimer: This note was dictated with voice recognition software. Similar sounding words can inadvertently be transcribed and this note may contain transcription errors which may not have been corrected upon publication of note.**

## 2021-07-27 DIAGNOSIS — N760 Acute vaginitis: Secondary | ICD-10-CM | POA: Diagnosis not present

## 2021-07-27 DIAGNOSIS — Z01419 Encounter for gynecological examination (general) (routine) without abnormal findings: Secondary | ICD-10-CM | POA: Diagnosis not present

## 2021-07-27 DIAGNOSIS — Z6825 Body mass index (BMI) 25.0-25.9, adult: Secondary | ICD-10-CM | POA: Diagnosis not present

## 2021-07-27 DIAGNOSIS — R32 Unspecified urinary incontinence: Secondary | ICD-10-CM | POA: Diagnosis not present

## 2021-07-27 DIAGNOSIS — N952 Postmenopausal atrophic vaginitis: Secondary | ICD-10-CM | POA: Diagnosis not present

## 2021-07-28 ENCOUNTER — Ambulatory Visit: Payer: PPO | Admitting: Physical Therapy

## 2021-07-28 ENCOUNTER — Ambulatory Visit: Payer: Self-pay | Admitting: Surgery

## 2021-07-28 ENCOUNTER — Inpatient Hospital Stay: Payer: PPO | Attending: Hematology

## 2021-07-28 ENCOUNTER — Ambulatory Visit
Admission: RE | Admit: 2021-07-28 | Discharge: 2021-07-28 | Disposition: A | Discharge status: Home / Self Care | Payer: PPO | Source: Ambulatory Visit | Attending: Radiation Oncology | Admitting: Radiation Oncology

## 2021-07-28 ENCOUNTER — Encounter: Payer: Self-pay | Admitting: *Deleted

## 2021-07-28 ENCOUNTER — Encounter: Payer: Self-pay | Admitting: Hematology

## 2021-07-28 ENCOUNTER — Other Ambulatory Visit: Payer: Self-pay

## 2021-07-28 ENCOUNTER — Ambulatory Visit (HOSPITAL_BASED_OUTPATIENT_CLINIC_OR_DEPARTMENT_OTHER): Payer: PPO | Admitting: Genetic Counselor

## 2021-07-28 ENCOUNTER — Inpatient Hospital Stay (HOSPITAL_BASED_OUTPATIENT_CLINIC_OR_DEPARTMENT_OTHER): Payer: PPO | Admitting: Hematology

## 2021-07-28 VITALS — BP 156/76 | HR 58 | Temp 98.1°F | Resp 16 | Ht 67.0 in | Wt 157.0 lb

## 2021-07-28 DIAGNOSIS — C50912 Malignant neoplasm of unspecified site of left female breast: Secondary | ICD-10-CM | POA: Diagnosis not present

## 2021-07-28 DIAGNOSIS — C50412 Malignant neoplasm of upper-outer quadrant of left female breast: Secondary | ICD-10-CM | POA: Insufficient documentation

## 2021-07-28 DIAGNOSIS — Z780 Asymptomatic menopausal state: Secondary | ICD-10-CM | POA: Diagnosis not present

## 2021-07-28 DIAGNOSIS — Z170 Estrogen receptor positive status [ER+]: Secondary | ICD-10-CM

## 2021-07-28 DIAGNOSIS — I10 Essential (primary) hypertension: Secondary | ICD-10-CM | POA: Diagnosis not present

## 2021-07-28 DIAGNOSIS — I480 Paroxysmal atrial fibrillation: Secondary | ICD-10-CM

## 2021-07-28 DIAGNOSIS — I2609 Other pulmonary embolism with acute cor pulmonale: Secondary | ICD-10-CM

## 2021-07-28 DIAGNOSIS — I4891 Unspecified atrial fibrillation: Secondary | ICD-10-CM

## 2021-07-28 DIAGNOSIS — D6869 Other thrombophilia: Secondary | ICD-10-CM

## 2021-07-28 DIAGNOSIS — Z853 Personal history of malignant neoplasm of breast: Secondary | ICD-10-CM | POA: Diagnosis not present

## 2021-07-28 DIAGNOSIS — B1920 Unspecified viral hepatitis C without hepatic coma: Secondary | ICD-10-CM | POA: Insufficient documentation

## 2021-07-28 DIAGNOSIS — I272 Pulmonary hypertension, unspecified: Secondary | ICD-10-CM

## 2021-07-28 LAB — CBC WITH DIFFERENTIAL (CANCER CENTER ONLY)
Abs Immature Granulocytes: 0.01 10*3/uL (ref 0.00–0.07)
Basophils Absolute: 0 10*3/uL (ref 0.0–0.1)
Basophils Relative: 0 %
Eosinophils Absolute: 0.1 10*3/uL (ref 0.0–0.5)
Eosinophils Relative: 3 %
HCT: 38.5 % (ref 36.0–46.0)
Hemoglobin: 12.6 g/dL (ref 12.0–15.0)
Immature Granulocytes: 0 %
Lymphocytes Relative: 20 %
Lymphs Abs: 1 10*3/uL (ref 0.7–4.0)
MCH: 31.4 pg (ref 26.0–34.0)
MCHC: 32.7 g/dL (ref 30.0–36.0)
MCV: 96 fL (ref 80.0–100.0)
Monocytes Absolute: 0.6 10*3/uL (ref 0.1–1.0)
Monocytes Relative: 11 %
Neutro Abs: 3.2 10*3/uL (ref 1.7–7.7)
Neutrophils Relative %: 66 %
Platelet Count: 237 10*3/uL (ref 150–400)
RBC: 4.01 MIL/uL (ref 3.87–5.11)
RDW: 12.2 % (ref 11.5–15.5)
WBC Count: 4.9 10*3/uL (ref 4.0–10.5)
nRBC: 0 % (ref 0.0–0.2)

## 2021-07-28 LAB — CMP (CANCER CENTER ONLY)
ALT: 11 U/L (ref 0–44)
AST: 18 U/L (ref 15–41)
Albumin: 4.5 g/dL (ref 3.5–5.0)
Alkaline Phosphatase: 56 U/L (ref 38–126)
Anion gap: 6 (ref 5–15)
BUN: 20 mg/dL (ref 8–23)
CO2: 32 mmol/L (ref 22–32)
Calcium: 10 mg/dL (ref 8.9–10.3)
Chloride: 103 mmol/L (ref 98–111)
Creatinine: 0.91 mg/dL (ref 0.44–1.00)
GFR, Estimated: >60 mL/min (ref >60)
Glucose, Bld: 89 mg/dL (ref 70–99)
Potassium: 4.4 mmol/L (ref 3.5–5.1)
Sodium: 141 mmol/L (ref 135–145)
Total Bilirubin: 0.9 mg/dL (ref 0.3–1.2)
Total Protein: 7.1 g/dL (ref 6.5–8.1)

## 2021-07-28 LAB — GENETIC SCREENING ORDER

## 2021-07-28 NOTE — H&P (View-Only) (Signed)
Subjective   Chief Complaint: Breast Cancer   Breast [facility] 07/28/21 Cardiology - Dr. Rayann Heman  History of Present Illness: Christy [name] is a 81 y.o. female who is seen today as an office consultation at the request of Dr. Lysle Rubens for evaluation of Breast Cancer .   This is an 81 year old female who recently underwent a routine screening mammogram.  She was found to have a mass in her left breast.  Further work-up revealed a mass measuring 1.4 x 1.0 cm located at 12:00 in the left breast, 8 cm from the nipple.  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  ER/PR positive, HER2 negative.  Ki-67 5%  The patient is accompanied by her husband.  She is fairly active.  She is followed by cardiology for atrial fibrillation.  She is anticoagulated on Eliquis.  No family history of breast cancer Review of Systems: A complete review of systems was obtained from the patient.  I have reviewed this information and discussed as appropriate with the patient.  See HPI as well for other ROS.  Review of Systems  Constitutional: Negative.   HENT: Negative.   Eyes: Negative.   Respiratory: Negative.   Cardiovascular: Positive for leg swelling.  Gastrointestinal: Negative.   Genitourinary: Negative.   Musculoskeletal: Positive for back pain.  Skin: Negative.   Neurological: Negative.   Endo/Heme/Allergies: Bruises/bleeds easily.  Psychiatric/Behavioral: The patient is nervous/anxious.       Medical History: Past Medical History:  Diagnosis Date   GERD (gastroesophageal reflux disease)     Patient Active Problem List  Diagnosis   Acquired thrombophilia (CMS-HCC)   Acute embolism and thrombosis of unspecified deep veins of left lower extremity (CMS-HCC)   Acute pulmonary embolism (CMS-HCC)   Carotid stenosis   Chronic kidney disease, stage 2 (mild)   Chronic diastolic heart failure (CMS-HCC)   GERD (gastroesophageal reflux disease)   Malignant neoplasm of upper-outer quadrant of  left breast in female, estrogen receptor positive (CMS-HCC)   Malignant tumor of breast (CMS-HCC)   Mixed hyperlipidemia   Osteopenia   PAT (paroxysmal atrial tachycardia) (CMS-HCC)   Hammer toe of second toe of right foot   Hepatitis C   Vulvitis   Vitamin D deficiency   Essential hypertension   Dysphagia   Dysphonia   Dysuria   Deep venous thrombosis (CMS-HCC)   Viral syndrome   Alteration in anticoagulation   Persistent atrial fibrillation (CMS-HCC)   Laryngopharyngeal reflux (LPR)   Inguinal adenopathy   Bradycardia   Laryngitis due to gastroesophageal reflux    Past Surgical History:  Procedure Laterality Date   Transesophageal echocardiogram Left 03/08/2017   atrial fibulation ablation     CARDIOVERSION INT     HYSTERECTOMY     SPINE SURGERY       Allergies  Allergen Reactions   Caffeine Unknown    Heart palpitations   Epinephrine Palpitations    Current Outpatient Medications on File Prior to Visit  Medication Sig Dispense Refill   apixaban (ELIQUIS) 5 mg tablet TAKE 1 TABLET BY MOUTH TWICE DAILY, IN THE MORNING AND DINNER     ascorbic acid, vitamin C, (VITAMIN C) 1000 MG tablet Take by mouth     betamethasone valerate (VALISONE) 0.1 % cream betamethasone valerate 0.1 % topical cream  APPLY TOPICALLY TO VULVA TWICE DAILY AS NEEDED     cholecalciferol (VITAMIN D3) 1000 unit tablet Take by mouth     diltiazem (CARDIZEM) 30 MG tablet Take 1 tablet  every 4 hours AS NEEDED for AFIB heart rate >100     dofetilide (TIKOSYN) 250 MCG capsule dofetilide 250 mcg capsule  TAKE 1 CAPSULE BY MOUTH TWICE DAILY IN THE MORNING AND DINNER FOR HEART     FUROsemide (LASIX) 20 MG tablet 1 tablet     loratadine (CLARITIN) 10 mg tablet Take 10 mg by mouth once daily     losartan (COZAAR) 25 MG tablet Take 1 tablet by mouth once daily     magnesium 250 mg Tab Take by mouth     pantoprazole (PROTONIX) 40 MG DR tablet pantoprazole 40 mg tablet,delayed release  TAKE 1 TABLET BY  MOUTH TWICE DAILY     potassium chloride (KLOR-CON) 10 MEQ ER tablet potassium chloride ER 10 mEq tablet,extended release     propranoloL (INDERAL) 20 MG tablet TAKE 1 TABLET BY MOUTH TWICE DAILY WITH BREAKFAST AND DINNER     No current facility-administered medications on file prior to visit.    Family History  Problem Relation Age of Onset   Coronary Artery Disease (Blocked arteries around heart) Mother      Social History   Tobacco Use  Smoking Status Never  Smokeless Tobacco Never     Social History   Socioeconomic History   Marital status: Unknown  Tobacco Use   Smoking status: Never   Smokeless tobacco: Never  Vaping Use   Vaping Use: Never used  Substance and Sexual Activity   Alcohol use: Never   Drug use: Never    Objective:     Physical Exam   Constitutional:  WDWN in NAD, conversant, no obvious deformities; lying in bed comfortably Eyes:  Pupils equal, round; sclera anicteric; moist conjunctiva; no lid lag HENT:  Oral mucosa moist; good dentition  Neck:  No masses palpated, trachea midline; no thyromegaly Lungs:  CTA bilaterally; normal respiratory effort Breasts:  symmetric, no nipple changes; no palpable masses or lymphadenopathy on either side; some bruising in the upper left breast from the biopsy CV:  Regular rate and rhythm; no murmurs; extremities well-perfused with no edema; loop recorder in medial left breast Abd:  +bowel sounds, soft, non-tender, no palpable organomegaly; no palpable hernias Musc:  Unable to assess gait; no apparent clubbing or cyanosis in extremities Lymphatic:  No palpable cervical or axillary lymphadenopathy Skin:  Warm, dry; no sign of jaundice Psychiatric - alert and oriented x 4; calm mood and affect   Labs, Imaging and Diagnostic Testing: Diagnosis Breast, left, needle core biopsy, left breast mass 12:00 8 cmfn - INVASIVE DUCTAL CARCINOMA - DUCTAL CARCINOMA IN SITU - SEE COMMENT Microscopic Comment Based on the  biopsy, the carcinoma appears Nottingham grade 1-2 of 3 and measures 0.5 cm in greatest linear extent. Prognostic markers (ER/PR/ki-67/HER2) are pending and will be reported in an addendum. Dr. Vic Ripper reviewed the case and agrees with the above diagnosis. These results were called to [facility] on July 21, 2021. Thressa Sheller MD Pathologist, Electronic Signature (Case signed 07/21/2021  FLUORESCENCE IN-SITU HYBRIDIZATION Results: GROUP 5: HER2 **NEGATIVE** HER2 FISH was performed by a technologist and cell imaging and analysis on the [employer]. RATIO OF HER2/CEN17 SIGNALS 1.11 AVERAGE HER2 COPY NUMBER PER CELL 1.95 The ratio of HER2/CEN 17 is within the range < 2.0 of HER2/CEN 17 and a copy number of HER2 signals per cell is <4.0. Arch Pathol Lab Med 1:1,2018 Thressa Sheller MD Pathologist, Electronic Signature ( Signed 07/25/2021) PROGNOSTIC INDICATORS Results: IMMUNOHISTOCHEMICAL AND MORPHOMETRIC ANALYSIS PERFORMED MANUALLY Estrogen Receptor: 100%,  POSITIVE, STRONG STAINING INTENSITY Progesterone Receptor: 95%, POSITIVE, STRONG STAINING INTENSITY Proliferation Marker Ki67: 5% REFERENCE RANGE ESTROGEN RECEPTOR NEGATIVE 0% POSITIVE =>1% REFERENCE RANGE PROGESTERONE RECEPTOR NEGATIVE 0% POSITIVE =>1%  Assessment and Plan:  Diagnoses and all orders for this visit:  Invasive ductal carcinoma of breast, left (CMS-HCC)    After discussion with the patient, her husband, and the rest of her care team, we have decided to proceed with breast conserving therapy.  Despite her excellent health status, at her age there is no strong indication for sentinel lymph node biopsy with this low risk cancer.  Therefore we will proceed only with lumpectomy.  Left radioactive seed localized lumpectomy.The surgical procedure has been discussed with the patient.  Potential risks, benefits, alternative treatments, and expected outcomes have been explained.  All of the patient's questions at this time  have been answered.  The likelihood of reaching the patient's treatment goal is good.  The patient understand the proposed surgical procedure and wishes to proceed.  We will obtain cardiac clearance prior to scheduling surgery  Ethyl Vila Jearld Adjutant, MD  07/28/2021 3:54 PM
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
## 2021-07-28 NOTE — H&P (Signed)
° °Subjective  ° °Chief Complaint: Breast Cancer °  °Breast MDC 07/28/21 °Cardiology - Dr. Allred ° °History of Present Illness: °Christy [name] is a 81 y.o. female who is seen today as an office consultation at the request of Dr. Husain for evaluation of Breast Cancer °.   °This is an 81-year-old female who recently underwent a routine screening mammogram.  She was found to have a mass in her left breast.  Further work-up revealed a mass measuring 1.4 x 1.0 cm located at 12:00 in the left breast, 8 cm from the nipple.  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  ER/PR positive, HER2 negative.  Ki-67 5% ° °The patient is accompanied by her husband.  She is fairly active.  She is followed by cardiology for atrial fibrillation.  She is anticoagulated on Eliquis. ° °No family history of breast cancer °Review of Systems: °A complete review of systems was obtained from the patient.  I have reviewed this information and discussed as appropriate with the patient.  See HPI as well for other ROS. ° °Review of Systems  °Constitutional: Negative.   °HENT: Negative.   °Eyes: Negative.   °Respiratory: Negative.   °Cardiovascular: Positive for leg swelling.  °Gastrointestinal: Negative.   °Genitourinary: Negative.   °Musculoskeletal: Positive for back pain.  °Skin: Negative.   °Neurological: Negative.   °Endo/Heme/Allergies: Bruises/bleeds easily.  °Psychiatric/Behavioral: The patient is nervous/anxious.   °  ° ° °Medical History: °Past Medical History:  °Diagnosis Date  ° GERD (gastroesophageal reflux disease)   ° ° °Patient Active Problem List  °Diagnosis  ° Acquired thrombophilia (CMS-HCC)  ° Acute embolism and thrombosis of unspecified deep veins of left lower extremity (CMS-HCC)  ° Acute pulmonary embolism (CMS-HCC)  ° Carotid stenosis  ° Chronic kidney disease, stage 2 (mild)  ° Chronic diastolic heart failure (CMS-HCC)  ° GERD (gastroesophageal reflux disease)  ° Malignant neoplasm of upper-outer quadrant of  left breast in female, estrogen receptor positive (CMS-HCC)  ° Malignant tumor of breast (CMS-HCC)  ° Mixed hyperlipidemia  ° Osteopenia  ° PAT (paroxysmal atrial tachycardia) (CMS-HCC)  ° Hammer toe of second toe of right foot  ° Hepatitis C  ° Vulvitis  ° Vitamin D deficiency  ° Essential hypertension  ° Dysphagia  ° Dysphonia  ° Dysuria  ° Deep venous thrombosis (CMS-HCC)  ° Viral syndrome  ° Alteration in anticoagulation  ° Persistent atrial fibrillation (CMS-HCC)  ° Laryngopharyngeal reflux (LPR)  ° Inguinal adenopathy  ° Bradycardia  ° Laryngitis due to gastroesophageal reflux  ° ° °Past Surgical History:  °Procedure Laterality Date  ° Transesophageal echocardiogram Left 03/08/2017  ° atrial fibulation ablation    ° CARDIOVERSION INT    ° HYSTERECTOMY    ° SPINE SURGERY    °  ° °Allergies  °Allergen Reactions  ° Caffeine Unknown  °  Heart palpitations  ° Epinephrine Palpitations  ° ° °Current Outpatient Medications on File Prior to Visit  °Medication Sig Dispense Refill  ° apixaban (ELIQUIS) 5 mg tablet TAKE 1 TABLET BY MOUTH TWICE DAILY, IN THE MORNING AND DINNER    ° ascorbic acid, vitamin C, (VITAMIN C) 1000 MG tablet Take by mouth    ° betamethasone valerate (VALISONE) 0.1 % cream betamethasone valerate 0.1 % topical cream ° APPLY TOPICALLY TO VULVA TWICE DAILY AS NEEDED    ° cholecalciferol (VITAMIN D3) 1000 unit tablet Take by mouth    ° diltiazem (CARDIZEM) 30 MG tablet Take 1 tablet every   4 hours AS NEEDED for AFIB heart rate >100    ° dofetilide (TIKOSYN) 250 MCG capsule dofetilide 250 mcg capsule ° TAKE 1 CAPSULE BY MOUTH TWICE DAILY IN THE MORNING AND DINNER FOR HEART    ° FUROsemide (LASIX) 20 MG tablet 1 tablet    ° loratadine (CLARITIN) 10 mg tablet Take 10 mg by mouth once daily    ° losartan (COZAAR) 25 MG tablet Take 1 tablet by mouth once daily    ° magnesium 250 mg Tab Take by mouth    ° pantoprazole (PROTONIX) 40 MG DR tablet pantoprazole 40 mg tablet,delayed release ° TAKE 1 TABLET BY  MOUTH TWICE DAILY    ° potassium chloride (KLOR-CON) 10 MEQ ER tablet potassium chloride ER 10 mEq tablet,extended release    ° propranoloL (INDERAL) 20 MG tablet TAKE 1 TABLET BY MOUTH TWICE DAILY WITH BREAKFAST AND DINNER    ° °No current facility-administered medications on file prior to visit.  ° ° °Family History  °Problem Relation Age of Onset  ° Coronary Artery Disease (Blocked arteries around heart) Mother   °  ° °Social History  ° °Tobacco Use  °Smoking Status Never  °Smokeless Tobacco Never  °  ° °Social History  ° °Socioeconomic History  ° Marital status: Unknown  °Tobacco Use  ° Smoking status: Never  ° Smokeless tobacco: Never  °Vaping Use  ° Vaping Use: Never used  °Substance and Sexual Activity  ° Alcohol use: Never  ° Drug use: Never  ° ° °Objective:  ° ° ° °Physical Exam  ° °Constitutional:  WDWN in NAD, conversant, no obvious deformities; lying in bed comfortably °Eyes:  Pupils equal, round; sclera anicteric; moist conjunctiva; no lid lag °HENT:  Oral mucosa moist; good dentition  °Neck:  No masses palpated, trachea midline; no thyromegaly °Lungs:  CTA bilaterally; normal respiratory effort °Breasts:  symmetric, no nipple changes; no palpable masses or lymphadenopathy on either side; some bruising in the upper left breast from the biopsy °CV:  Regular rate and rhythm; no murmurs; extremities well-perfused with no edema; loop recorder in medial left breast °Abd:  +bowel sounds, soft, non-tender, no palpable organomegaly; no palpable hernias °Musc:  Unable to assess gait; no apparent clubbing or cyanosis in extremities °Lymphatic:  No palpable cervical or axillary lymphadenopathy °Skin:  Warm, dry; no sign of jaundice °Psychiatric - alert and oriented x 4; calm mood and affect ° ° °Labs, Imaging and Diagnostic Testing: °Diagnosis °Breast, left, needle core biopsy, left breast mass 12:00 8 cmfn °- INVASIVE DUCTAL CARCINOMA °- DUCTAL CARCINOMA IN SITU °- SEE COMMENT °Microscopic Comment °Based on the  biopsy, the carcinoma appears Nottingham grade 1-2 of 3 and measures 0.5 cm in greatest linear extent. °Prognostic markers (ER/PR/ki-67/HER2) are pending and will be reported in an addendum. Dr. Kashikar reviewed the °case and agrees with the above diagnosis. These results were called to [facility] on July 21, 2021. °DAWN BUTLER MD °Pathologist, Electronic Signature °(Case signed 07/21/2021 ° °FLUORESCENCE IN-SITU HYBRIDIZATION °Results: °GROUP 5: HER2 **NEGATIVE** °HER2 FISH was performed by a technologist and cell imaging and analysis on the [employer]. °RATIO OF HER2/CEN17 SIGNALS 1.11 °AVERAGE HER2 COPY NUMBER PER CELL 1.95 °The ratio of HER2/CEN 17 is within the range < 2.0 of HER2/CEN 17 and a copy number of HER2 signals per cell is <4.0. °Arch [facility]ol [facility] Med 1:1,2018 °DAWN BUTLER MD °Pathologist, Electronic Signature °( Signed 07/25/2021) °PROGNOSTIC INDICATORS °Results: °IMMUNOHISTOCHEMICAL AND MORPHOMETRIC ANALYSIS PERFORMED MANUALLY °Estrogen Receptor: 100%, POSITIVE,   POSITIVE, STRONG STAINING INTENSITY Progesterone Receptor: 95%, POSITIVE, STRONG STAINING INTENSITY Proliferation Marker Ki67: 5% REFERENCE RANGE ESTROGEN RECEPTOR NEGATIVE 0% POSITIVE =>1% REFERENCE RANGE PROGESTERONE RECEPTOR NEGATIVE 0% POSITIVE =>1%  Assessment and Plan:  Diagnoses and all orders for this visit:  Invasive ductal carcinoma of breast, left (CMS-HCC)    After discussion with the patient, her husband, and the rest of her care team, we have decided to proceed with breast conserving therapy.  Despite her excellent health status, at her age there is no strong indication for sentinel lymph node biopsy with this low risk cancer.  Therefore we will proceed only with lumpectomy.  Left radioactive seed localized lumpectomy.The surgical procedure has been discussed with the patient.  Potential risks, benefits, alternative treatments, and expected outcomes have been explained.  All of the patient's questions at this time  have been answered.  The likelihood of reaching the patient's treatment goal is good.  The patient understand the proposed surgical procedure and wishes to proceed.  We will obtain cardiac clearance prior to scheduling surgery  Mclain Freer Jearld Adjutant, MD  07/28/2021 3:54 PM

## 2021-07-28 NOTE — H&P (View-Only) (Signed)
Subjective   Chief Complaint: Breast Cancer   Breast [facility] 07/28/21 Cardiology - Dr. Rayann Heman  History of Present Illness: Christy [name] is a 81 y.o. female who is seen today as an office consultation at the request of Dr. Lysle Rubens for evaluation of Breast Cancer .   This is an 81 year old female who recently underwent a routine screening mammogram.  She was found to have a mass in her left breast.  Further work-up revealed a mass measuring 1.4 x 1.0 cm located at 12:00 in the left breast, 8 cm from the nipple.  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  ER/PR positive, HER2 negative.  Ki-67 5%  The patient is accompanied by her husband.  She is fairly active.  She is followed by cardiology for atrial fibrillation.  She is anticoagulated on Eliquis.  No family history of breast cancer Review of Systems: A complete review of systems was obtained from the patient.  I have reviewed this information and discussed as appropriate with the patient.  See HPI as well for other ROS.  Review of Systems  Constitutional: Negative.   HENT: Negative.   Eyes: Negative.   Respiratory: Negative.   Cardiovascular: Positive for leg swelling.  Gastrointestinal: Negative.   Genitourinary: Negative.   Musculoskeletal: Positive for back pain.  Skin: Negative.   Neurological: Negative.   Endo/Heme/Allergies: Bruises/bleeds easily.  Psychiatric/Behavioral: The patient is nervous/anxious.       Medical History: Past Medical History:  Diagnosis Date   GERD (gastroesophageal reflux disease)     Patient Active Problem List  Diagnosis   Acquired thrombophilia (CMS-HCC)   Acute embolism and thrombosis of unspecified deep veins of left lower extremity (CMS-HCC)   Acute pulmonary embolism (CMS-HCC)   Carotid stenosis   Chronic kidney disease, stage 2 (mild)   Chronic diastolic heart failure (CMS-HCC)   GERD (gastroesophageal reflux disease)   Malignant neoplasm of upper-outer quadrant of  left breast in female, estrogen receptor positive (CMS-HCC)   Malignant tumor of breast (CMS-HCC)   Mixed hyperlipidemia   Osteopenia   PAT (paroxysmal atrial tachycardia) (CMS-HCC)   Hammer toe of second toe of right foot   Hepatitis C   Vulvitis   Vitamin D deficiency   Essential hypertension   Dysphagia   Dysphonia   Dysuria   Deep venous thrombosis (CMS-HCC)   Viral syndrome   Alteration in anticoagulation   Persistent atrial fibrillation (CMS-HCC)   Laryngopharyngeal reflux (LPR)   Inguinal adenopathy   Bradycardia   Laryngitis due to gastroesophageal reflux    Past Surgical History:  Procedure Laterality Date   Transesophageal echocardiogram Left 03/08/2017   atrial fibulation ablation     CARDIOVERSION INT     HYSTERECTOMY     SPINE SURGERY       Allergies  Allergen Reactions   Caffeine Unknown    Heart palpitations   Epinephrine Palpitations    Current Outpatient Medications on File Prior to Visit  Medication Sig Dispense Refill   apixaban (ELIQUIS) 5 mg tablet TAKE 1 TABLET BY MOUTH TWICE DAILY, IN THE MORNING AND DINNER     ascorbic acid, vitamin C, (VITAMIN C) 1000 MG tablet Take by mouth     betamethasone valerate (VALISONE) 0.1 % cream betamethasone valerate 0.1 % topical cream  APPLY TOPICALLY TO VULVA TWICE DAILY AS NEEDED     cholecalciferol (VITAMIN D3) 1000 unit tablet Take by mouth     diltiazem (CARDIZEM) 30 MG tablet Take 1 tablet  every 4 hours AS NEEDED for AFIB heart rate >100     dofetilide (TIKOSYN) 250 MCG capsule dofetilide 250 mcg capsule  TAKE 1 CAPSULE BY MOUTH TWICE DAILY IN THE MORNING AND DINNER FOR HEART     FUROsemide (LASIX) 20 MG tablet 1 tablet     loratadine (CLARITIN) 10 mg tablet Take 10 mg by mouth once daily     losartan (COZAAR) 25 MG tablet Take 1 tablet by mouth once daily     magnesium 250 mg Tab Take by mouth     pantoprazole (PROTONIX) 40 MG DR tablet pantoprazole 40 mg tablet,delayed release  TAKE 1 TABLET BY  MOUTH TWICE DAILY     potassium chloride (KLOR-CON) 10 MEQ ER tablet potassium chloride ER 10 mEq tablet,extended release     propranoloL (INDERAL) 20 MG tablet TAKE 1 TABLET BY MOUTH TWICE DAILY WITH BREAKFAST AND DINNER     No current facility-administered medications on file prior to visit.    Family History  Problem Relation Age of Onset   Coronary Artery Disease (Blocked arteries around heart) Mother      Social History   Tobacco Use  Smoking Status Never  Smokeless Tobacco Never     Social History   Socioeconomic History   Marital status: Unknown  Tobacco Use   Smoking status: Never   Smokeless tobacco: Never  Vaping Use   Vaping Use: Never used  Substance and Sexual Activity   Alcohol use: Never   Drug use: Never    Objective:     Physical Exam   Constitutional:  WDWN in NAD, conversant, no obvious deformities; lying in bed comfortably Eyes:  Pupils equal, round; sclera anicteric; moist conjunctiva; no lid lag HENT:  Oral mucosa moist; good dentition  Neck:  No masses palpated, trachea midline; no thyromegaly Lungs:  CTA bilaterally; normal respiratory effort Breasts:  symmetric, no nipple changes; no palpable masses or lymphadenopathy on either side; some bruising in the upper left breast from the biopsy CV:  Regular rate and rhythm; no murmurs; extremities well-perfused with no edema; loop recorder in medial left breast Abd:  +bowel sounds, soft, non-tender, no palpable organomegaly; no palpable hernias Musc:  Unable to assess gait; no apparent clubbing or cyanosis in extremities Lymphatic:  No palpable cervical or axillary lymphadenopathy Skin:  Warm, dry; no sign of jaundice Psychiatric - alert and oriented x 4; calm mood and affect   Labs, Imaging and Diagnostic Testing: Diagnosis Breast, left, needle core biopsy, left breast mass 12:00 8 cmfn - INVASIVE DUCTAL CARCINOMA - DUCTAL CARCINOMA IN SITU - SEE COMMENT Microscopic Comment Based on the  biopsy, the carcinoma appears Nottingham grade 1-2 of 3 and measures 0.5 cm in greatest linear extent. Prognostic markers (ER/PR/ki-67/HER2) are pending and will be reported in an addendum. Dr. Vic Ripper reviewed the case and agrees with the above diagnosis. These results were called to [facility] on July 21, 2021. Thressa Sheller MD Pathologist, Electronic Signature (Case signed 07/21/2021  FLUORESCENCE IN-SITU HYBRIDIZATION Results: GROUP 5: HER2 **NEGATIVE** HER2 FISH was performed by a technologist and cell imaging and analysis on the [employer]. RATIO OF HER2/CEN17 SIGNALS 1.11 AVERAGE HER2 COPY NUMBER PER CELL 1.95 The ratio of HER2/CEN 17 is within the range < 2.0 of HER2/CEN 17 and a copy number of HER2 signals per cell is <4.0. Arch Pathol Lab Med 1:1,2018 Thressa Sheller MD Pathologist, Electronic Signature ( Signed 07/25/2021) PROGNOSTIC INDICATORS Results: IMMUNOHISTOCHEMICAL AND MORPHOMETRIC ANALYSIS PERFORMED MANUALLY Estrogen Receptor: 100%,  POSITIVE, STRONG STAINING INTENSITY Progesterone Receptor: 95%, POSITIVE, STRONG STAINING INTENSITY Proliferation Marker Ki67: 5% REFERENCE RANGE ESTROGEN RECEPTOR NEGATIVE 0% POSITIVE =>1% REFERENCE RANGE PROGESTERONE RECEPTOR NEGATIVE 0% POSITIVE =>1%  Assessment and Plan:  Diagnoses and all orders for this visit:  Invasive ductal carcinoma of breast, left (CMS-HCC)    After discussion with the patient, her husband, and the rest of her care team, we have decided to proceed with breast conserving therapy.  Despite her excellent health status, at her age there is no strong indication for sentinel lymph node biopsy with this low risk cancer.  Therefore we will proceed only with lumpectomy.  Left radioactive seed localized lumpectomy.The surgical procedure has been discussed with the patient.  Potential risks, benefits, alternative treatments, and expected outcomes have been explained.  All of the patient's questions at this time  have been answered.  The likelihood of reaching the patient's treatment goal is good.  The patient understand the proposed surgical procedure and wishes to proceed.  We will obtain cardiac clearance prior to scheduling surgery  Knoah Nedeau Jearld Adjutant, MD  07/28/2021 3:54 PM

## 2021-07-29 ENCOUNTER — Telehealth: Payer: Self-pay | Admitting: *Deleted

## 2021-07-29 ENCOUNTER — Encounter: Payer: Self-pay | Admitting: Genetic Counselor

## 2021-07-29 NOTE — Telephone Encounter (Signed)
Clinical pharmacist to review Eliquis 

## 2021-07-29 NOTE — Telephone Encounter (Signed)
° °  Pre-operative Risk Assessment    Patient Name: Christy [name]  DOB: December 21, 1940 MRN: [id number]      Request for Surgical Clearance    Procedure:   LEFT BREAST LUMPECTOMY  Date of Surgery:  Clearance TBD                                 Surgeon:  DR. MATTHEW TSUEI Surgeon's Group or Practice Name:  [place] Phone number:  [phone] Fax number:  [phone] ATTN: Mammie Lorenzo, LPN   Type of Clearance Requested:   - Medical  - Pharmacy:  Hold Apixaban (Eliquis)     Type of Anesthesia:  General    Additional requests/questions:    Jiles Prows   07/29/2021, 10:18 AM

## 2021-07-29 NOTE — Progress Notes (Signed)
REFERRING PROVIDER: Truitt Merle, MD [address] [place],  [address]  PRIMARY PROVIDER:  Wenda Low, MD  PRIMARY REASON FOR VISIT:  1. Malignant neoplasm of upper-outer quadrant of left breast in female, estrogen receptor positive ([place])     HISTORY OF PRESENT ILLNESS:   Christy [name], a 81 y.o. female, was seen for a [place] cancer genetics consultation during the breast multidisciplinary clinic at the request of Dr. Burr Medico due to a personal history of cancer.  Christy [name] presents to clinic today to discuss the possibility of a hereditary predisposition to cancer, to discuss genetic testing, and to further clarify her future cancer risks, as well as potential cancer risks for family members.   In January 2023, at the age of 63, Christy [name] was diagnosed with invasive ductal carcinoma of the left breast.   CANCER HISTORY:  Oncology History Overview Note   Cancer Staging  Malignant neoplasm of upper-outer quadrant of left breast in female, estrogen receptor positive ([place]) Staging form: Breast, AJCC 8th Edition - Clinical stage from 07/20/2021: Stage IA (cT1c, cN0, cM0, G1, ER+, PR+, HER2-) - Signed by Truitt Merle, MD on 07/27/2021    Malignant neoplasm of upper-outer quadrant of left breast in female, estrogen receptor positive ([place])  07/15/2021 Mammogram   Exam: Breast Ultrasound - L  There is an irregular mass with a spiculated margin in the left breast at 12 o'clock posterior depth 8 cm from the nipple. It measures 1.4 x 1.1 x 0.9 cm. This irregular mass is hypoechoic. This correlated with mammography findings.  Ultrasound of the left axilla is negative for lymphadenopathy. Multiple normal lymph nodes are seen.  IMPRESSION: The irregular mass in the left breast is highly suggestive of malignancy.   07/20/2021 Cancer Staging   Staging form: Breast, AJCC 8th Edition - Clinical stage from 07/20/2021: Stage IA (cT1c, cN0, cM0, G1, ER+, PR+, HER2-) - Signed by Truitt Merle, MD  on 07/27/2021 Stage prefix: Initial diagnosis Histologic grading system: 3 grade system    07/20/2021 Initial Biopsy   Diagnosis Breast, left, needle core biopsy, left breast mass 12:00 8 cmfn - INVASIVE DUCTAL CARCINOMA - DUCTAL CARCINOMA IN SITU - SEE COMMENT Microscopic Comment Based on the biopsy, the carcinoma appears Nottingham grade 1-2 of 3 and measures 0.5 cm in greatest linear extent.  PROGNOSTIC INDICATORS Results: Estrogen Receptor: 100%, POSITIVE, STRONG STAINING INTENSITY Progesterone Receptor: 95%, POSITIVE, STRONG STAINING INTENSITY Proliferation Marker Ki67: 5%  FLUORESCENCE IN-SITU HYBRIDIZATION Results: GROUP 5: HER2 **NEGATIVE**   07/26/2021 Initial Diagnosis   Malignant neoplasm of upper-outer quadrant of left breast in female, estrogen receptor positive ([place])      RISK FACTORS:  Menarche was at age 57.  First live birth at age 1.  OCP use for approximately 0 years.  Ovaries intact: yes.  Uterus intact: yes.  Menopausal status: postmenopausal.  HRT use: 10 years. Colonoscopy: yes Mammogram within the last year: yes. Any excessive radiation exposure in the past: no  Past Medical History:  Diagnosis Date   Atrial fibrillation ([place])    Bilateral lower extremity edema    Chronic, likely related to venous stasis    DJD (degenerative joint disease), lumbosacral    Dyslipidemia    Essential hypertension    "dx'd recently" (10/06/2016)   GERD (gastroesophageal reflux disease)    History of hepatitis C virus infection ~ 1997   In remission   Lung granuloma ([place]) 2004    CT scan   Osteopenia    Paroxysmal  atrial tachycardia (HCC)    PE (pulmonary embolism) 01/2016   Spondylolisthesis    Status post epidural injections 3 by neurosurgery  - Dr. Joya Salm    Varicose veins of both legs with edema    Vitamin D deficiency     Past Surgical History:  Procedure Laterality Date   ATRIAL FIBRILLATION ABLATION N/A 03/09/2017   Procedure: Atrial  Fibrillation Ablation;  Surgeon: [name], MD;  Location: [place];  Service: Cardiovascular;  Laterality: N/A;   BACK SURGERY     CARDIOVERSION N/A 03/21/2016   Procedure: CARDIOVERSION;  Surgeon: [name], MD;  Location: [place];  Service: Cardiovascular;  Laterality: N/A;   COMBINED HYSTEROSCOPY DIAGNOSTIC / D&C  2005   COMBINED HYSTEROSCOPY DIAGNOSTIC / D&C  1999   DILATION AND CURETTAGE OF UTERUS     LOOP RECORDER INSERTION N/A 10/31/2017   Procedure: LOOP RECORDER INSERTION;  Surgeon: [name], MD;  Location: [place] [facility];  Service: Cardiovascular;  Laterality: N/A;   [place]   "went in front and back; broken neck"   TEE WITHOUT CARDIOVERSION N/A 03/08/2017   Procedure: TRANSESOPHAGEAL ECHOCARDIOGRAM (TEE);  Surgeon: Josue Hector, MD;  Location: [facility];  Service: Cardiovascular;  Laterality: N/A;   TOE SURGERY Right    replaced joint   TONSILLECTOMY     TUBAL LIGATION Bilateral 1976   VAGINAL DELIVERY     x2    Social History   Socioeconomic History   Marital status: Married    Spouse name: Not on file   Number of children: 2   Years of education: Not on file   Highest education level: Not on file  Occupational History   Not on file  Tobacco Use   Smoking status: Never   Smokeless tobacco: Never  Vaping Use   Vaping Use: Never used  Substance and Sexual Activity   Alcohol use: Yes    Alcohol/week: 14.0 standard drinks    Types: 14 Glasses of wine per week    Comment: 10/06/2016 "wine daily"   Drug use: No   Sexual activity: Yes    Birth control/protection: Other-see comments    Comment: tubal ligation  Other Topics Concern   Not on file  Social History Narrative   She is a married mother of 1. Lives with her husband. 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 She has never smoked and drinks up to 10 glasses of wine or so week.   She usually exercises roughly 3 days a week doing yoga and plays golf. She  is mostly limited by her "time constraints "   Social Determinants of Radio broadcast assistant Strain: Not on file  Food Insecurity: Not on file  Transportation Needs: Not on file  Physical Activity: Not on file  Stress: Not on file  Social Connections: Not on file     FAMILY HISTORY:  We obtained a detailed, 4-generation family history.  Significant diagnoses are listed below: Family History  Problem Relation Age of Onset   Heart Problems Mother    CVA Mother    Heart attack Mother 41   CVA Father    Colon cancer Neg Hx    Clotting disorder Neg Hx       Ms. Messamore is unaware of previous family history of cancer and/or hereditary cancer genetic testing. There is no reported Ashkenazi Jewish ancestry.   GENETIC COUNSELING ASSESSMENT: Ms. Litaker is a 81 y.o. female with  a personal history of cancer which is somewhat suggestive of a hereditary cancer syndrome and predisposition. We, therefore, discussed and recommended the following at today's visit.   DISCUSSION: We discussed that 5 - 10% of cancer is hereditary, with most cases of hereditary breast cancer associated with mutations in BRCA1/2.  There are other genes that can be associated with hereditary breast cancer syndromes. Type of cancer risk and level of risk are gene-specific. We discussed that testing is beneficial for several reasons including knowing how to follow individuals after completing their treatment, identifying whether potential treatment options would be beneficial, and understanding if other family members could be at risk for cancer and allowing them to undergo genetic testing.   We reviewed the characteristics, features and inheritance patterns of hereditary cancer syndromes. We also discussed genetic testing, including the appropriate family members to test, the process of testing, insurance coverage and turn-around-time for results. We discussed the implications of a negative, positive and/or variant of  uncertain significant result.  Ms. Waight  was offered a common hereditary cancer panel (47 genes) and an expanded pan-cancer panel (77 genes). Ms. Topete was informed of the benefits and limitations of each panel, including that expanded pan-cancer panels contain genes that do not have clear management guidelines at this point in time.  We also discussed that as the number of genes included on a panel increases, the chances of variants of uncertain significance increases.  After considering the benefits and limitations of each gene panel, Ms. Arrick elected to have [facility]'s CancerNext-Expanded Panel.   The CancerNext-Expanded gene panel offered by [facility] and includes sequencing, rearrangement, and RNA analysis for the following 77 genes: AIP, ALK, APC, ATM, AXIN2, BAP1, BARD1, BLM, BMPR1A, BRCA1, BRCA2, BRIP1, CDC73, CDH1, CDK4, CDKN1B, CDKN2A, CHEK2, CTNNA1, DICER1, FANCC, FH, FLCN, GALNT12, KIF1B, LZTR1, MAX, MEN1, MET, MLH1, MSH2, MSH3, MSH6, MUTYH, NBN, NF1, NF2, NTHL1, PALB2, PHOX2B, PMS2, POT1, PRKAR1A, PTCH1, PTEN, RAD51C, RAD51D, RB1, RECQL, RET, SDHA, SDHAF2, SDHB, SDHC, SDHD, SMAD4, SMARCA4, SMARCB1, SMARCE1, STK11, SUFU, TMEM127, TP53, TSC1, TSC2, VHL and XRCC2 (sequencing and deletion/duplication); EGFR, EGLN1, HOXB13, KIT, MITF, PDGFRA, POLD1, and POLE (sequencing only); EPCAM and GREM1 (deletion/duplication only).   Based on Ms. Capitano personal history of cancer, she does not meet medical criteria for genetic testing. She may have an out of pocket cost. We discussed that if her out of pocket cost for testing is over $100, the laboratory should contact them to discuss self-pay prices, patient pay assistance programs, if applicable, and other billing options.   PLAN: After considering the risks, benefits, and limitations, Ms. Goodroe provided informed consent to pursue genetic testing and the blood sample was sent to [facility] for analysis of the CancerNext-Expanded Panel.  Results should be available within approximately 2-3 weeks' time, at which point they will be disclosed by telephone to Ms. Junker, as will any additional recommendations warranted by these results. Ms. Shira will receive a summary of her genetic counseling visit and a copy of her results once available. This information will also be available in Epic.   Ms. Digilio questions were answered to her satisfaction today. Our contact information was provided should additional questions or concerns arise. Thank you for the referral and allowing [place] to share in the care of your patient.   Lucille Passy, MS, [facility] Genetic Counselor [email or web address] (P) [phone]  The patient was seen for a total of 20 minutes in face-to-face genetic counseling.  The patient brought her husband. Drs. Lindi Adie and/or Burr Medico  were available to discuss this case as needed.  _______________________________________________________________________ For Office Staff:  Number of people involved in session: 2 Was an Intern/ student involved with case: no

## 2021-07-30 DIAGNOSIS — H6122 Impacted cerumen, left ear: Secondary | ICD-10-CM | POA: Diagnosis not present

## 2021-08-02 NOTE — Telephone Encounter (Signed)
° °  Name: Christy [name]  DOB: 05/29/1941  MRN: [id number]   Primary Cardiologist: Dr. Rayann Heman  Returned patient call. I reached out to patient for update on how she is doing. The patient affirms she has been doing well without any new cardiac symptoms. Able to participate in yoga, exercise classes and walking without any chest pain, dyspnea, new palpitations or syncope. Therefore, based on ACC/AHA guidelines, the patient would be at acceptable risk for the planned procedure without further cardiovascular testing. The patient was advised that if she develops new symptoms prior to surgery to contact our office to arrange for a follow-up visit, and she verbalized understanding.  Per pharmD, "Per office protocol, patient can hold Eliquis for 2 days prior to procedure.  Patient will not need bridging with Lovenox (enoxaparin) around procedure.Please restart anticoagulation as soon as possible after procedure, at discretion of surgeon."  Will route this bundled recommendation to requesting provider via Epic fax function. Please call with questions.   Charlie Pitter, PA-C 08/02/2021, 5:11 PM

## 2021-08-02 NOTE — Telephone Encounter (Signed)
° °  Name: Christy [name]  DOB: Aug 02, 1940  MRN: [id number]   Primary Cardiologist: Dr. Rayann Heman  Chart reviewed as part of pre-operative protocol coverage. Patient was contacted 08/02/2021 in reference to pre-operative risk assessment for pending surgery as outlined below.  [name] was last seen 03/2021 by Dr. Rayann Heman, primarily followed for atrial fibrillation and mitral regurgitation. Last echo 04/2021 EF 65-70%, moderate MR, mild-moderate PR, otherwise findings as noted. Last nuc 2017 was normal. Last ILR interrogation 05/2021 without arrhythmias. RCRI calculated at 0.4% indicating low CV risk. Called pt to ensure no new sx. Got VM. LMTCB.  Charlie Pitter, PA-C 08/02/2021, 11:54 AM

## 2021-08-02 NOTE — Telephone Encounter (Signed)
Patient with diagnosis of atrial fibrillation on Eliquis for anticoagulation.    Procedure: left breast lumpectomy Date of procedure: TBD   CHA2DS2-VASc Score = 5   This indicates a 7.2% annual risk of stroke. The patient's score is based upon: CHF History: 0 HTN History: 1 Diabetes History: 0 Stroke History: 0 Vascular Disease History: 1 Age Score: 2 Gender Score: 1   Patient also has history of PE/DVT - 2017, noted to be provoked due to estrogen supplementation; no further complications  CrCl 46 Platelet count 237  Per office protocol, patient can hold Eliquis for 2 days prior to procedure.   Patient will not need bridging with Lovenox (enoxaparin) around procedure.  Please restart anticoagulation as soon as possible after procedure, at discretion of surgeon.

## 2021-08-02 NOTE — Telephone Encounter (Signed)
Pt called back in returning the call.    Best number [phone]- pt is going to play bridge right now and will not be available until 4:30.  Please call her after 4:30 pm today

## 2021-08-03 ENCOUNTER — Encounter: Payer: Self-pay | Admitting: General Practice

## 2021-08-03 ENCOUNTER — Other Ambulatory Visit: Payer: Self-pay | Admitting: Nurse Practitioner

## 2021-08-03 NOTE — Progress Notes (Signed)
[place] Psychosocial Distress Screening Spiritual Care  Met with Christy [name] briefly by phone following Breast Multidisciplinary Clinic to introduce [place] team/resources, reviewing distress screen per protocol.  The patient scored a  0  on the Psychosocial Distress Thermometer which indicates  minimal  distress. Also assessed for distress and other psychosocial needs.   ONCBCN DISTRESS SCREENING 08/03/2021  Screening Type Initial Screening  Distress experienced in past week (1-10) 0  Referral to support programs Yes    Follow up needed: Yes.   Reached Christy [name] at an inconvenient time, so plan to call back later in the week.   [place], [place], [employer] Pager [phone] Voicemail [phone]

## 2021-08-04 ENCOUNTER — Encounter: Payer: Self-pay | Admitting: General Practice

## 2021-08-04 NOTE — Progress Notes (Signed)
[facility] Spiritual Care Note  Reached Christy [name] by phone briefly for follow-up check-in. (Yesterday she was playing bridge; today she was about to go into yoga class.) She reports good support and many meaning-making activities, and she knows to contact chaplain if needs arise or circumstances change.   [place], [place], [facility] Pager [phone] Voicemail [phone]

## 2021-08-05 ENCOUNTER — Telehealth: Payer: Self-pay | Admitting: *Deleted

## 2021-08-05 ENCOUNTER — Encounter: Payer: Self-pay | Admitting: *Deleted

## 2021-08-05 DIAGNOSIS — Z170 Estrogen receptor positive status [ER+]: Secondary | ICD-10-CM

## 2021-08-05 DIAGNOSIS — C50412 Malignant neoplasm of upper-outer quadrant of left female breast: Secondary | ICD-10-CM

## 2021-08-05 NOTE — Telephone Encounter (Signed)
Spoke with patient to follow up from [facility] 2/8 and assess navigation needs. Patient denies any questions or concerns at this time. Encouraged her to call should anything arise. Patient verbalized understanding.  °

## 2021-08-05 NOTE — Progress Notes (Signed)
Surgical Instructions    Your procedure is scheduled on Thursday, February 23rd.  Report to [facility] Main Entrance "A" at 9:15 A.M., then check in with the Admitting office.  Call this number if you have problems the morning of surgery:  [phone]   If you have any questions prior to your surgery date call [phone]: Open Monday-Friday 8am-4pm    Remember:  Do not eat after midnight the night before your surgery  You may drink clear liquids until 8:15 AM the morning of your surgery.   Clear liquids allowed are: Water, Non-Citrus Juices (without pulp), Carbonated Beverages, Clear Tea, Black Coffee ONLY (NO MILK, CREAM OR POWDERED CREAMER of any kind), and Gatorade    Take these medicines the morning of surgery with A SIP OF WATER:   Diltiazem (Cardizem)  Dofetilide (Tikosyn)  Pantoprazole (Protonix)  Propranolol (Inderal)   If needed:  Claritin  Follow your surgeon's instructions on when to stop Eliquis.  If no instructions were given by your surgeon then you will need to call the office to get those instructions.    As of today, STOP taking any Aspirin (unless otherwise instructed by your surgeon) Aleve, Naproxen, Ibuprofen, Motrin, Advil, Goody's, BC's, all herbal medications, fish oil, and all vitamins.           DAY OF SURGERY: Do not wear jewelry or makeup Do not wear lotions, powders, perfumes, or deodorant. Do not shave 48 hours prior to surgery.   Do not bring valuables to the hospital. Do not wear nail polish, gel polish, artificial nails, or any other type of covering on natural nails (fingers and toes) If you have artificial nails or gel coating that need to be removed by a nail salon, please have this removed prior to surgery. Artificial nails or gel coating may interfere with anesthesia's ability to adequately monitor your vital signs.  [place] is not responsible for any belongings or valuables. .   Do NOT Smoke (Tobacco/Vaping)  24 hours prior to your  procedure  If you use a CPAP at night, you may bring your mask for your overnight stay.   Contacts, glasses, hearing aids, dentures or partials may not be worn into surgery, please bring cases for these belongings   For patients admitted to the hospital, discharge time will be determined by your treatment team.   Patients discharged the day of surgery will not be allowed to drive home, and someone needs to stay with them for 24 hours.  NO VISITORS WILL BE ALLOWED IN PRE-OP WHERE PATIENTS ARE PREPPED FOR SURGERY.  ONLY 1 SUPPORT PERSON MAY BE PRESENT IN THE WAITING ROOM WHILE YOU ARE IN SURGERY.  IF YOU ARE TO BE ADMITTED, ONCE YOU ARE IN YOUR ROOM YOU WILL BE ALLOWED TWO (2) VISITORS. 1 (ONE) VISITOR MAY STAY OVERNIGHT BUT MUST ARRIVE TO THE ROOM BY 8pm.  Minor children may have two parents present. Special consideration for safety and communication needs will be reviewed on a case by case basis.  Special instructions:    Oral Hygiene is also important to reduce your risk of infection.  Remember - BRUSH YOUR TEETH THE MORNING OF SURGERY WITH YOUR REGULAR TOOTHPASTE   [place]- Preparing For Surgery  Before surgery, you can play an important role. Because skin is not sterile, your skin needs to be as free of germs as possible. You can reduce the number of germs on your skin by washing with CHG (chlorahexidine gluconate) Soap before surgery.  CHG  is an antiseptic cleaner which kills germs and bonds with the skin to continue killing germs even after washing.     Please do not use if you have an allergy to CHG or antibacterial soaps. If your skin becomes reddened/irritated stop using the CHG.  Do not shave (including legs and underarms) for at least 48 hours prior to first CHG shower. It is OK to shave your face.  Please follow these instructions carefully.     Shower the NIGHT BEFORE SURGERY and the MORNING OF SURGERY with CHG Soap.   If you chose to wash your hair, wash your hair first  as usual with your normal shampoo. After you shampoo, rinse your hair and body thoroughly to remove the shampoo.  Then [employer] and genitals (private parts) with your normal soap and rinse thoroughly to remove soap.  After that Use CHG Soap as you would any other liquid soap. You can apply CHG directly to the skin and wash gently with a scrungie or a clean washcloth.   Apply the CHG Soap to your body ONLY FROM THE NECK DOWN.  Do not use on open wounds or open sores. Avoid contact with your eyes, ears, mouth and genitals (private parts). Wash Face and genitals (private parts)  with your normal soap.   Wash thoroughly, paying special attention to the area where your surgery will be performed.  Thoroughly rinse your body with warm water from the neck down.  DO NOT shower/wash with your normal soap after using and rinsing off the CHG Soap.  Pat yourself dry with a CLEAN TOWEL.  Wear CLEAN PAJAMAS to bed the night before surgery  Place CLEAN SHEETS on your bed the night before your surgery  DO NOT SLEEP WITH PETS.   Day of Surgery:  Take a shower with CHG soap. Wear Clean/Comfortable clothing the morning of surgery Do not apply any deodorants/lotions.   Remember to brush your teeth WITH YOUR REGULAR TOOTHPASTE.   Please read over the following fact sheets that you were given.

## 2021-08-06 ENCOUNTER — Inpatient Hospital Stay (HOSPITAL_COMMUNITY)
Admission: RE | Admit: 2021-08-06 | Discharge: 2021-08-06 | Disposition: A | Discharge status: Home / Self Care | Payer: PPO | Source: Ambulatory Visit

## 2021-08-10 ENCOUNTER — Ambulatory Visit
Admission: RE | Admit: 2021-08-10 | Discharge: 2021-08-10 | Disposition: A | Discharge status: Home / Self Care | Payer: Self-pay | Source: Ambulatory Visit | Attending: Radiation Oncology | Admitting: Radiation Oncology

## 2021-08-10 ENCOUNTER — Inpatient Hospital Stay
Admission: RE | Admit: 2021-08-10 | Discharge: 2021-08-10 | Disposition: A | Discharge status: Home / Self Care | Payer: Self-pay | Source: Ambulatory Visit | Attending: Radiation Oncology | Admitting: Radiation Oncology

## 2021-08-10 ENCOUNTER — Encounter (HOSPITAL_COMMUNITY): Payer: Self-pay

## 2021-08-10 ENCOUNTER — Encounter (HOSPITAL_COMMUNITY)
Admission: RE | Admit: 2021-08-10 | Discharge: 2021-08-10 | Disposition: A | Discharge status: Home / Self Care | Payer: PPO | Source: Ambulatory Visit | Attending: Surgery | Admitting: Surgery

## 2021-08-10 ENCOUNTER — Other Ambulatory Visit: Payer: Self-pay

## 2021-08-10 ENCOUNTER — Other Ambulatory Visit: Payer: Self-pay | Admitting: Radiation Oncology

## 2021-08-10 DIAGNOSIS — I119 Hypertensive heart disease without heart failure: Secondary | ICD-10-CM | POA: Diagnosis not present

## 2021-08-10 DIAGNOSIS — C50412 Malignant neoplasm of upper-outer quadrant of left female breast: Secondary | ICD-10-CM

## 2021-08-10 DIAGNOSIS — I4891 Unspecified atrial fibrillation: Secondary | ICD-10-CM | POA: Diagnosis not present

## 2021-08-10 DIAGNOSIS — C50912 Malignant neoplasm of unspecified site of left female breast: Secondary | ICD-10-CM | POA: Insufficient documentation

## 2021-08-10 DIAGNOSIS — Z01812 Encounter for preprocedural laboratory examination: Secondary | ICD-10-CM | POA: Diagnosis not present

## 2021-08-10 DIAGNOSIS — E785 Hyperlipidemia, unspecified: Secondary | ICD-10-CM | POA: Insufficient documentation

## 2021-08-10 DIAGNOSIS — Z86711 Personal history of pulmonary embolism: Secondary | ICD-10-CM | POA: Insufficient documentation

## 2021-08-10 DIAGNOSIS — Z170 Estrogen receptor positive status [ER+]: Secondary | ICD-10-CM

## 2021-08-10 DIAGNOSIS — K219 Gastro-esophageal reflux disease without esophagitis: Secondary | ICD-10-CM | POA: Diagnosis not present

## 2021-08-10 DIAGNOSIS — Z86718 Personal history of other venous thrombosis and embolism: Secondary | ICD-10-CM | POA: Insufficient documentation

## 2021-08-10 HISTORY — DX: Cardiac arrhythmia, unspecified: I49.9

## 2021-08-10 HISTORY — DX: Nausea with vomiting, unspecified: R11.2

## 2021-08-10 HISTORY — DX: Other specified postprocedural states: Z98.890

## 2021-08-10 HISTORY — DX: Nonrheumatic mitral (valve) insufficiency: I34.0

## 2021-08-10 HISTORY — DX: Anxiety disorder, unspecified: F41.9

## 2021-08-10 NOTE — Progress Notes (Signed)
PCP - Dr. Lysle Rubens Cardiologist - Dr. Rayann Heman Oncologist- Dr. Truitt Merle  PPM/ICD - n/a Device Orders - n/a Rep Notified - n/a Patient has a loop recorder.   Chest x-ray - n/a EKG - 04/12/21 Stress Test - 04/04/16 ECHO - 04/30/21 Cardiac Cath - denies  Sleep Study - denies CPAP - n/a  Fasting Blood Sugar - n/a Checks Blood Sugar _____ times a day- n/a  Blood Thinner Instructions: Patient states she took her last dose of apixaban (ELIQUIS) on 08/09/21. Aspirin Instructions: n/a  ERAS Protcol - Yes PRE-SURGERY Ensure or G2- n/a  COVID TEST- Ambulatory Surgery   Anesthesia review: Yes. Seed Lumpectomy. Cardiac history. Clearance note 07/29/21.  Patient denies shortness of breath, fever, cough and chest pain at PAT appointment   All instructions explained to the patient, with a verbal understanding of the material. Patient agrees to go over the instructions while at home for a better understanding. The opportunity to ask questions was provided.

## 2021-08-11 ENCOUNTER — Encounter (HOSPITAL_COMMUNITY): Payer: Self-pay

## 2021-08-11 DIAGNOSIS — N6325 Unspecified lump in the left breast, overlapping quadrants: Secondary | ICD-10-CM | POA: Diagnosis not present

## 2021-08-11 NOTE — Anesthesia Preprocedure Evaluation (Addendum)
Anesthesia Evaluation  Patient identified by MRN, date of birth, ID band Patient awake    Reviewed: Allergy & Precautions, NPO status , Patient's Chart, lab work & pertinent test results  History of Anesthesia Complications (+) PONV and history of anesthetic complications  Airway Mallampati: II  TM Distance: >3 FB     Dental   Pulmonary neg pulmonary ROS,    breath sounds clear to auscultation       Cardiovascular hypertension,  Rhythm:Regular Rate:Normal     Neuro/Psych negative neurological ROS     GI/Hepatic GERD  ,(+) Hepatitis -  Endo/Other    Renal/GU Renal disease     Musculoskeletal  (+) Arthritis ,   Abdominal   Peds  Hematology   Anesthesia Other Findings   Reproductive/Obstetrics                            Anesthesia Physical Anesthesia Plan  ASA: 3  Anesthesia Plan: General   Post-op Pain Management:    Induction: Intravenous  PONV Risk Score and Plan: 4 or greater and Ondansetron, Dexamethasone and Midazolam  Airway Management Planned: LMA and Oral ETT  Additional Equipment:   Intra-op Plan:   Post-operative Plan:   Informed Consent: I have reviewed the patients History and Physical, chart, labs and discussed the procedure including the risks, benefits and alternatives for the proposed anesthesia with the patient or authorized representative who has indicated his/her understanding and acceptance.     Dental advisory given  Plan Discussed with: CRNA and Anesthesiologist  Anesthesia Plan Comments: (PAT note written 08/11/2021 by Myra Gianotti, PA-C. )       Anesthesia Quick Evaluation

## 2021-08-11 NOTE — Progress Notes (Signed)
Anesthesia Chart Review:  Case: [id number] Date/Time: 08/12/21 1100   Procedure: LEFT BREAST LUMPECTOMY WITH RADIOACTIVE SEED LOCALIZATION (Left: Breast)   Anesthesia type: General   Pre-op diagnosis: LEFT BREAST INVASIVE DUCTAL CARCINOMA   Location: [place] OR ROOM 08 / [place] OR   Surgeons: Donnie Mesa, MD       DISCUSSION: Patient is an 81 year old female scheduled for the above procedure.  History includes never smoker, post-operative N/V, paroxysmal atrial tachycardia/afib (DCCV 03/21/16; ablation 03/09/17; [employer] Reveal LINQ implantable loop recorder 10/31/17), dyslipidemia, breast cancer (07/20/21: [facility], DCIS), GERD, varicose veins/LE edema with venous stasis, lung granuloma, DVT/PE (LLE DVT/PE 01/2016), HTN, hepatitis C (~ 1997), spinal surgery. 04/2021 echo showed LVEF 65-70%, no regional wall motion abnormalities, moderate asymmetric LVH of the basal-septal segment, normal RVSF, mildly enlarged RV, mildly elevated PASP with estimated RVSP 34.8 mmHg, severely dilated LA/RA, bileaflet MVP, moderate MR and mild-moderate PR, trivial AR.  Last cardiology visit with Dr. Rayann Heman on 04/12/21. Preoperative cardiology input outlined by Melina Copa, PA-C on 08/02/21: "I reached out to patient for update on how she is doing. The patient affirms she has been doing well without any new cardiac symptoms. Able to participate in yoga, exercise classes and walking without any chest pain, dyspnea, new palpitations or syncope. Therefore, based on ACC/AHA guidelines, the patient would be at acceptable risk for the planned procedure without further cardiovascular testing. The patient was advised that if she develops new symptoms prior to surgery to contact our office to arrange for a follow-up visit, and she verbalized understanding.   Per pharmD, 'Per office protocol, patient can hold Eliquis for 2 days prior to procedure.  Patient will not need bridging with Lovenox (enoxaparin) around procedure.Please restart  anticoagulation as soon as possible after procedure, at discretion of surgeon.'" She noted that last ILR interrogation 05/2021 was without arrhythmias..    RSL at [facility] 08/11/21 at 12:15 PM.  Last Eliquis 08/09/21. Anesthesia team to evaluate on the day of surgery.   VS: BP 137/72    Pulse (!) 59    Temp 36.5 C (Oral)    Resp 18    Ht 5' 7.5" (1.715 m)    Wt 71.6 kg    SpO2 99%    BMI 24.35 kg/m    PROVIDERS: Wenda Low, MD is PCP  [name], MD is EP cardiologist. Also seen by Roderic Palau, NP at the [facility]. Truitt Merle, MD is HEM-ONC Kyung Rudd, MD is RAD-ONC   LABS: She had labs on 07/28/21. Results included: Lab Results  Component Value Date   WBC 4.9 07/28/2021   HGB 12.6 07/28/2021   HCT 38.5 07/28/2021   PLT 237 07/28/2021   GLUCOSE 89 07/28/2021   ALT 11 07/28/2021   AST 18 07/28/2021   NA 141 07/28/2021   K 4.4 07/28/2021   CL 103 07/28/2021   CREATININE 0.91 07/28/2021   BUN 20 07/28/2021   CO2 32 07/28/2021   TSH 1.963 03/03/2021     EKG: 04/12/21: Sinus Rhythm with first degree AV block   CV: Echo 04/30/21: IMPRESSIONS   1. Left ventricular ejection fraction, by estimation, is 65 to 70%. The  left ventricle has normal function. The left ventricle has no regional  wall motion abnormalities. There is moderate asymmetric left ventricular  hypertrophy of the basal-septal  segment. Left ventricular diastolic parameters were normal.   2. Right ventricular systolic function is normal. The right ventricular  size is mildly enlarged. There is mildly  elevated pulmonary artery  systolic pressure. The estimated right ventricular systolic pressure is  76.1 mmHg.   3. Left atrial size was severely dilated.   4. Right atrial size was severely dilated.   5. The mitral valve is degenerative. Bileaflet prolapse. Moderate mitral  valve regurgitation.   6. Pulmonic valve regurgitation is mild to moderate.   7. The aortic valve is tricuspid. Aortic valve  regurgitation is trivial.  No aortic stenosis is present.    US Carotid 09/29/16: Impressions: Heterogeneous plaque on the right; normal left carotid artery. 1-39% RICA stenosis. Normal subclavian arteries, bilaterally. Patent vertebral arteries with antegrade flow. F/U PRN.   Nuclear stress test 04/04/16: Nuclear stress EF: 57%. There was no ST segment deviation noted during stress. The study is normal. This is a low risk study. The left ventricular ejection fraction is normal (55-65%). Normal resting and stress perfusion. No ischemia or infarction EF 57%     Past Medical History:  Diagnosis Date   Anxiety    Atrial fibrillation ([place])    Bilateral lower extremity edema    Chronic, likely related to venous stasis    DJD (degenerative joint disease), lumbosacral    Dyslipidemia    Dysrhythmia    A fib   Essential hypertension    "dx'd recently" (10/06/2016)   GERD (gastroesophageal reflux disease)    History of hepatitis C virus infection ~ 1997   In remission   Lung granuloma ([place]) 2004   CT scan   Mitral regurgitation    04/30/21 echo: bileaflet prolapse with moderate MR   Osteopenia    Paroxysmal atrial tachycardia ([facility])    PE (pulmonary embolism) 01/2016   PONV (postoperative nausea and vomiting)    Patient states she had extremem nausea after neck surgery several years ago   Spondylolisthesis    Status post epidural injections 3 by neurosurgery  - Dr. Joya Salm    Varicose veins of both legs with edema    Vitamin D deficiency     Past Surgical History:  Procedure Laterality Date   ATRIAL FIBRILLATION ABLATION N/A 03/09/2017   Procedure: Atrial Fibrillation Ablation;  Surgeon: [name], MD;  Location: [place] CV LAB;  Service: Cardiovascular;  Laterality: N/A;   BACK SURGERY     CARDIOVERSION N/A 03/21/2016   Procedure: CARDIOVERSION;  Surgeon: [name], MD;  Location: [place];  Service: Cardiovascular;  Laterality: N/A;   COMBINED  HYSTEROSCOPY DIAGNOSTIC / D&C  2005   COMBINED HYSTEROSCOPY DIAGNOSTIC / D&C  1999   DILATION AND CURETTAGE OF UTERUS     LOOP RECORDER INSERTION N/A 10/31/2017   Procedure: LOOP RECORDER INSERTION;  Surgeon: [name], MD;  Location: [place] CV LAB;  Service: Cardiovascular;  Laterality: N/A;   [place]   "went in front and back; broken neck"   TEE WITHOUT CARDIOVERSION N/A 03/08/2017   Procedure: TRANSESOPHAGEAL ECHOCARDIOGRAM (TEE);  Surgeon: Josue Hector, MD;  Location: [facility] ENDOSCOPY;  Service: Cardiovascular;  Laterality: N/A;   TOE SURGERY Right    replaced joint   TONSILLECTOMY     TUBAL LIGATION Bilateral 1976   VAGINAL DELIVERY     x2    MEDICATIONS:  apixaban (ELIQUIS) 5 MG TABS tablet   Ascorbic Acid (VITAMIN C) 1000 MG tablet   betamethasone valerate (VALISONE) 0.1 % cream   cholecalciferol (VITAMIN D3) 25 MCG (1000 UNIT) tablet   dofetilide (TIKOSYN) 250 MCG capsule   furosemide (LASIX) 20 MG tablet   guaiFENesin (  MUCINEX) 600 MG 12 hr tablet   loratadine (CLARITIN) 10 MG tablet   losartan (COZAAR) 25 MG tablet   Magnesium 250 MG TABS   Multiple Vitamin (MULTIVITAMIN WITH MINERALS) TABS tablet   pantoprazole (PROTONIX) 40 MG tablet   potassium chloride (KLOR-CON) 10 MEQ tablet   propranolol (INDERAL) 20 MG tablet   No current facility-administered medications for this encounter.    Myra Gianotti, PA-C Surgical Short Stay/Anesthesiology [facility] Phone [phone] [facility] Phone [phone] 08/11/2021 9:49 AM

## 2021-08-12 ENCOUNTER — Ambulatory Visit (HOSPITAL_COMMUNITY)
Admission: RE | Admit: 2021-08-12 | Discharge: 2021-08-12 | Disposition: A | Discharge status: Home / Self Care | Payer: PPO | Source: Ambulatory Visit | Attending: Surgery | Admitting: Surgery

## 2021-08-12 ENCOUNTER — Ambulatory Visit (HOSPITAL_COMMUNITY): Payer: PPO | Admitting: Vascular Surgery

## 2021-08-12 ENCOUNTER — Other Ambulatory Visit: Payer: Self-pay

## 2021-08-12 ENCOUNTER — Encounter (HOSPITAL_COMMUNITY)
Admission: RE | Disposition: A | Discharge status: Home / Self Care | Payer: Self-pay | Source: Ambulatory Visit | Attending: Surgery

## 2021-08-12 ENCOUNTER — Encounter (HOSPITAL_COMMUNITY): Payer: Self-pay | Admitting: Surgery

## 2021-08-12 ENCOUNTER — Ambulatory Visit (HOSPITAL_BASED_OUTPATIENT_CLINIC_OR_DEPARTMENT_OTHER): Payer: PPO | Admitting: Critical Care Medicine

## 2021-08-12 DIAGNOSIS — K219 Gastro-esophageal reflux disease without esophagitis: Secondary | ICD-10-CM | POA: Insufficient documentation

## 2021-08-12 DIAGNOSIS — E782 Mixed hyperlipidemia: Secondary | ICD-10-CM | POA: Diagnosis not present

## 2021-08-12 DIAGNOSIS — M1990 Unspecified osteoarthritis, unspecified site: Secondary | ICD-10-CM

## 2021-08-12 DIAGNOSIS — C50912 Malignant neoplasm of unspecified site of left female breast: Secondary | ICD-10-CM | POA: Diagnosis not present

## 2021-08-12 DIAGNOSIS — Z79899 Other long term (current) drug therapy: Secondary | ICD-10-CM | POA: Diagnosis not present

## 2021-08-12 DIAGNOSIS — I4819 Other persistent atrial fibrillation: Secondary | ICD-10-CM | POA: Insufficient documentation

## 2021-08-12 DIAGNOSIS — Z86711 Personal history of pulmonary embolism: Secondary | ICD-10-CM | POA: Insufficient documentation

## 2021-08-12 DIAGNOSIS — I130 Hypertensive heart and chronic kidney disease with heart failure and stage 1 through stage 4 chronic kidney disease, or unspecified chronic kidney disease: Secondary | ICD-10-CM | POA: Insufficient documentation

## 2021-08-12 DIAGNOSIS — I10 Essential (primary) hypertension: Secondary | ICD-10-CM

## 2021-08-12 DIAGNOSIS — Z7901 Long term (current) use of anticoagulants: Secondary | ICD-10-CM | POA: Diagnosis not present

## 2021-08-12 DIAGNOSIS — D0512 Intraductal carcinoma in situ of left breast: Secondary | ICD-10-CM | POA: Insufficient documentation

## 2021-08-12 DIAGNOSIS — Z170 Estrogen receptor positive status [ER+]: Secondary | ICD-10-CM | POA: Insufficient documentation

## 2021-08-12 DIAGNOSIS — C50412 Malignant neoplasm of upper-outer quadrant of left female breast: Secondary | ICD-10-CM | POA: Diagnosis not present

## 2021-08-12 DIAGNOSIS — N182 Chronic kidney disease, stage 2 (mild): Secondary | ICD-10-CM | POA: Diagnosis not present

## 2021-08-12 DIAGNOSIS — D0511 Intraductal carcinoma in situ of right breast: Secondary | ICD-10-CM | POA: Diagnosis not present

## 2021-08-12 HISTORY — PX: BREAST LUMPECTOMY WITH RADIOACTIVE SEED LOCALIZATION: SHX6424

## 2021-08-12 SURGERY — BREAST LUMPECTOMY WITH RADIOACTIVE SEED LOCALIZATION
Anesthesia: General | Site: Breast | Laterality: Left

## 2021-08-12 MED ORDER — BUPIVACAINE HCL (PF) 0.25 % IJ SOLN
INTRAMUSCULAR | Status: DC | PRN
  Administered 2021-08-12: 10 mL

## 2021-08-12 MED ORDER — AMISULPRIDE (ANTIEMETIC) 5 MG/2ML IV SOLN
INTRAVENOUS | Status: AC
  Filled 2021-08-12: qty 4

## 2021-08-12 MED ORDER — ORAL CARE MOUTH RINSE: 15.0000 mL | Freq: Once | OROMUCOSAL | Status: AC

## 2021-08-12 MED ORDER — CHLORHEXIDINE GLUCONATE CLOTH 2 % EX PADS
6.0000 | MEDICATED_PAD | Freq: Once | CUTANEOUS | Status: DC
Start: 2021-08-12 — End: 2021-08-12

## 2021-08-12 MED ORDER — BUPIVACAINE HCL (PF) 0.25 % IJ SOLN
INTRAMUSCULAR | Status: AC
  Filled 2021-08-12: qty 30

## 2021-08-12 MED ORDER — FENTANYL CITRATE (PF) 100 MCG/2ML IJ SOLN: 25.0000 ug | INTRAMUSCULAR | Status: DC | PRN

## 2021-08-12 MED ORDER — ACETAMINOPHEN 500 MG PO TABS: 1000.0000 mg | ORAL_TABLET | ORAL | Status: AC

## 2021-08-12 MED ORDER — AMISULPRIDE (ANTIEMETIC) 5 MG/2ML IV SOLN
10.0000 mg | Freq: Once | INTRAVENOUS | Status: AC
  Administered 2021-08-12: 10 mg via INTRAVENOUS

## 2021-08-12 MED ORDER — ONDANSETRON HCL 4 MG/2ML IJ SOLN
INTRAMUSCULAR | Status: DC | PRN
Start: 2021-08-12 — End: 2021-08-12
  Administered 2021-08-12: 4 mg via INTRAVENOUS

## 2021-08-12 MED ORDER — CEFAZOLIN SODIUM-DEXTROSE 2-4 GM/100ML-% IV SOLN
INTRAVENOUS | Status: AC
Start: 2021-08-12 — End: 2021-08-12
  Filled 2021-08-12: qty 100

## 2021-08-12 MED ORDER — DEXAMETHASONE SODIUM PHOSPHATE 10 MG/ML IJ SOLN
INTRAMUSCULAR | Status: DC | PRN
  Administered 2021-08-12: 4 mg via INTRAVENOUS

## 2021-08-12 MED ORDER — CHLORHEXIDINE GLUCONATE 0.12 % MT SOLN
OROMUCOSAL | Status: AC
  Administered 2021-08-12: 15 mL via OROMUCOSAL
  Filled 2021-08-12: qty 15

## 2021-08-12 MED ORDER — PROPOFOL 10 MG/ML IV BOLUS
INTRAVENOUS | Status: DC | PRN
Start: 2021-08-12 — End: 2021-08-12
  Administered 2021-08-12: 160 mg via INTRAVENOUS

## 2021-08-12 MED ORDER — LIDOCAINE 2% (20 MG/ML) 5 ML SYRINGE
INTRAMUSCULAR | Status: DC | PRN
  Administered 2021-08-12: 100 mg via INTRAVENOUS

## 2021-08-12 MED ORDER — LACTATED RINGERS IV SOLN: INTRAVENOUS | Status: DC

## 2021-08-12 MED ORDER — 0.9 % SODIUM CHLORIDE (POUR BTL) OPTIME
TOPICAL | Status: DC | PRN
Start: 2021-08-12 — End: 2021-08-12
  Administered 2021-08-12: 500 mL

## 2021-08-12 MED ORDER — ACETAMINOPHEN 500 MG PO TABS
ORAL_TABLET | ORAL | Status: AC
  Administered 2021-08-12: 1000 mg via ORAL
  Filled 2021-08-12: qty 2

## 2021-08-12 MED ORDER — EPHEDRINE SULFATE-NACL 50-0.9 MG/10ML-% IV SOSY
PREFILLED_SYRINGE | INTRAVENOUS | Status: DC | PRN
Start: 2021-08-12 — End: 2021-08-12
  Administered 2021-08-12 (×2): 2.5 mg via INTRAVENOUS

## 2021-08-12 MED ORDER — CHLORHEXIDINE GLUCONATE 0.12 % MT SOLN: 15.0000 mL | Freq: Once | OROMUCOSAL | Status: AC

## 2021-08-12 MED ORDER — BUPIVACAINE-EPINEPHRINE (PF) 0.25% -1:200000 IJ SOLN
INTRAMUSCULAR | Status: AC
  Filled 2021-08-12: qty 30

## 2021-08-12 MED ORDER — FENTANYL CITRATE (PF) 250 MCG/5ML IJ SOLN
INTRAMUSCULAR | Status: DC | PRN
Start: 2021-08-12 — End: 2021-08-12
  Administered 2021-08-12: 50 ug via INTRAVENOUS
  Administered 2021-08-12: 25 ug via INTRAVENOUS

## 2021-08-12 MED ORDER — PHENYLEPHRINE HCL-NACL 20-0.9 MG/250ML-% IV SOLN
INTRAVENOUS | Status: DC | PRN
  Administered 2021-08-12: 50 ug/min via INTRAVENOUS

## 2021-08-12 MED ORDER — FENTANYL CITRATE (PF) 250 MCG/5ML IJ SOLN
INTRAMUSCULAR | Status: AC
  Filled 2021-08-12: qty 5

## 2021-08-12 MED ORDER — CEFAZOLIN SODIUM-DEXTROSE 2-4 GM/100ML-% IV SOLN
2.0000 g | INTRAVENOUS | Status: AC
  Administered 2021-08-12: 2 g via INTRAVENOUS

## 2021-08-12 SURGICAL SUPPLY — 38 items
APL PRP STRL LF DISP 70% ISPRP (MISCELLANEOUS) ×1
APL SKNCLS STERI-STRIP NONHPOA (GAUZE/BANDAGES/DRESSINGS) ×1
APPLIER CLIP 9.375 MED OPEN (MISCELLANEOUS) ×2
APR CLP MED 9.3 20 MLT OPN (MISCELLANEOUS) ×1
BAG COUNTER SPONGE SURGICOUNT (BAG) ×2 IMPLANT
BAG SPNG CNTER NS LX DISP (BAG) ×1
BENZOIN TINCTURE PRP APPL 2/3 (GAUZE/BANDAGES/DRESSINGS) ×2 IMPLANT
CANISTER SUCT 3000ML PPV (MISCELLANEOUS) ×2 IMPLANT
CHLORAPREP W/TINT 26 (MISCELLANEOUS) ×2 IMPLANT
CLIP APPLIE 9.375 MED OPEN (MISCELLANEOUS) IMPLANT
COVER PROBE W GEL 5X96 (DRAPES) ×2 IMPLANT
COVER SURGICAL LIGHT HANDLE (MISCELLANEOUS) ×2 IMPLANT
DEVICE DUBIN SPECIMEN MAMMOGRA (MISCELLANEOUS) ×2 IMPLANT
DRAPE CHEST BREAST 15X10 FENES (DRAPES) ×2 IMPLANT
DRSG TEGADERM 4X4.75 (GAUZE/BANDAGES/DRESSINGS) ×2 IMPLANT
ELECT CAUTERY BLADE 6.4 (BLADE) ×2 IMPLANT
ELECT REM PT RETURN 9FT ADLT (ELECTROSURGICAL) ×2
ELECTRODE REM PT RTRN 9FT ADLT (ELECTROSURGICAL) ×1 IMPLANT
GAUZE SPONGE 2X2 8PLY STRL LF (GAUZE/BANDAGES/DRESSINGS) ×1 IMPLANT
GLOVE SURG ENC MOIS LTX SZ7 (GLOVE) ×2 IMPLANT
GLOVE SURG UNDER POLY LF SZ7.5 (GLOVE) ×2 IMPLANT
GOWN STRL REUS W/ TWL LRG LVL3 (GOWN DISPOSABLE) ×2 IMPLANT
GOWN STRL REUS W/TWL LRG LVL3 (GOWN DISPOSABLE) ×4
KIT BASIN OR (CUSTOM PROCEDURE TRAY) ×2 IMPLANT
KIT MARKER MARGIN INK (KITS) ×2 IMPLANT
NDL HYPO 25GX1X1/2 BEV (NEEDLE) ×1 IMPLANT
NEEDLE HYPO 25GX1X1/2 BEV (NEEDLE) ×2 IMPLANT
NS IRRIG 1000ML POUR BTL (IV SOLUTION) ×2 IMPLANT
PACK GENERAL/GYN (CUSTOM PROCEDURE TRAY) ×2 IMPLANT
SPONGE GAUZE 2X2 STER 10/PKG (GAUZE/BANDAGES/DRESSINGS) ×1
SPONGE T-LAP 4X18 ~~LOC~~+RFID (SPONGE) ×2 IMPLANT
STRIP CLOSURE SKIN 1/2X4 (GAUZE/BANDAGES/DRESSINGS) ×2 IMPLANT
SUT MNCRL AB 4-0 PS2 18 (SUTURE) ×2 IMPLANT
SUT VIC AB 3-0 SH 27 (SUTURE) ×2
SUT VIC AB 3-0 SH 27X BRD (SUTURE) ×1 IMPLANT
SYR CONTROL 10ML LL (SYRINGE) ×2 IMPLANT
TOWEL GREEN STERILE (TOWEL DISPOSABLE) ×2 IMPLANT
TOWEL GREEN STERILE FF (TOWEL DISPOSABLE) ×2 IMPLANT

## 2021-08-12 NOTE — Interval H&P Note (Signed)
History and Physical Interval Note:  08/12/2021 10:20 AM  Christy [name]  has presented today for surgery, with the diagnosis of LEFT BREAST INVASIVE DUCTAL CARCINOMA.  The various methods of treatment have been discussed with the patient and family. After consideration of risks, benefits and other options for treatment, the patient has consented to  Procedure(s): LEFT BREAST LUMPECTOMY WITH RADIOACTIVE SEED LOCALIZATION (Left) as a surgical intervention.  The patient's history has been reviewed, patient examined, no change in status, stable for surgery.  I have reviewed the patient's chart and labs.  Questions were answered to the patient's satisfaction.     Maia Petties

## 2021-08-12 NOTE — Op Note (Signed)
Pre-op Diagnosis:  Left breast invasive ductal carcinoma Post-op Diagnosis: same Procedure:  Left radioactive seed localized lumpectomy Surgeon:  Disaya Walt K. Anesthesia:  GEN - LMA Indications:  This is an 81 year old female who recently underwent a routine screening mammogram.  She was found to have a mass in her left breast.  Further work-up revealed a mass measuring 1.4 x 1.0 cm located at 12:00 in the left breast, 8 cm from the nipple.  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  ER/PR positive, HER2 negative.  Ki-67 5%   The patient is accompanied by her husband.  She is fairly active.  She is followed by cardiology for atrial fibrillation.  She is anticoagulated on Eliquis.  Description of procedure: The patient is brought to the operating room placed in supine position on the operating room table. After an adequate level of general anesthesia was obtained, her left breast was prepped with ChloraPrep and draped in sterile fashion. A timeout was taken to ensure the proper patient and proper procedure. We interrogated the breast with the neoprobe. We made a transverse incision in the upper left breast after infiltrating with 0.25% Marcaine. Dissection was carried down in the breast tissue with cautery. We used the neoprobe to guide [place] towards the radioactive seed. We excised an area of tissue around the radioactive seed 2 cm in diameter. The specimen was removed and was oriented with a paint kit. Specimen mammogram showed the radioactive seed as well as the biopsy clip within the specimen. This was sent for pathologic examination. There is no residual radioactivity within the biopsy cavity. We inspected carefully for hemostasis. The wound was thoroughly irrigated. The wound was closed with a deep layer of 3-0 Vicryl and a subcuticular layer of 4-0 Monocryl. Benzoin Steri-Strips were applied. The patient was then extubated and brought to the recovery room in stable condition. All  sponge, instrument, and needle counts are correct.  Imogene Burn. Georgette Dover, MD, [facility] Surgery  General/ Trauma Surgery  08/12/2021 11:11 AM

## 2021-08-12 NOTE — Anesthesia Postprocedure Evaluation (Signed)
Anesthesia Post Note  Patient: Christy [name]  Procedure(s) Performed: LEFT BREAST LUMPECTOMY WITH RADIOACTIVE SEED LOCALIZATION (Left: Breast)     Patient location during evaluation: PACU Anesthesia Type: General Level of consciousness: awake Pain management: pain level controlled Vital Signs Assessment: post-procedure vital signs reviewed and stable Respiratory status: spontaneous breathing Cardiovascular status: stable Postop Assessment: no apparent nausea or vomiting Anesthetic complications: no   No notable events documented.  Last Vitals:  Vitals:   08/12/21 1132 08/12/21 1139  BP: (!) 153/64   Pulse: (!) 52 (!) 51  Resp: (!) 9 (!) 8  Temp:    SpO2: 96% 96%    Last Pain:  Vitals:   08/12/21 1132  TempSrc:   PainSc: 0-No pain                 Raffi Milstein

## 2021-08-12 NOTE — Transfer of Care (Signed)
Immediate Anesthesia Transfer of Care Note  Patient: Christy [name]  Procedure(s) Performed: LEFT BREAST LUMPECTOMY WITH RADIOACTIVE SEED LOCALIZATION (Left: Breast)  Patient Location: PACU  Anesthesia Type:General  Level of Consciousness: awake and alert   Airway & Oxygen Therapy: Patient Spontanous Breathing and Patient connected to nasal cannula oxygen  Post-op Assessment: Report given to RN and Post -op Vital signs reviewed and stable  Post vital signs: Reviewed and stable  Last Vitals:  Vitals Value Taken Time  BP 152/73 08/12/21 1118  Temp    Pulse 54 08/12/21 1118  Resp 14 08/12/21 1118  SpO2 99 % 08/12/21 1118  Vitals shown include unvalidated device data.  Last Pain:  Vitals:   08/12/21 0934  TempSrc:   PainSc: 0-No pain         Complications: No notable events documented.

## 2021-08-12 NOTE — Anesthesia Procedure Notes (Signed)
Procedure Name: LMA Insertion Date/Time: 08/12/2021 10:33 AM Performed by: Wilburn Cornelia, CRNA Pre-anesthesia Checklist: Patient identified, Emergency Drugs available, Patient being monitored, Suction available and Timeout performed Patient Re-evaluated:Patient Re-evaluated prior to induction Oxygen Delivery Method: Circle system utilized Preoxygenation: Pre-oxygenation with 100% oxygen Induction Type: IV induction Ventilation: Mask ventilation without difficulty LMA: LMA inserted LMA Size: 4.0 Number of attempts: 1 Placement Confirmation: positive ETCO2 and breath sounds checked- equal and bilateral Tube secured with: Tape Dental Injury: Teeth and Oropharynx as per pre-operative assessment

## 2021-08-12 NOTE — Discharge Instructions (Signed)
[facility] Office Phone Number [phone]  BREAST BIOPSY/ PARTIAL MASTECTOMY: POST OP INSTRUCTIONS  Always review your discharge instruction sheet given to you by the facility where your surgery was performed.  IF YOU HAVE DISABILITY OR FAMILY LEAVE FORMS, YOU MUST BRING THEM TO THE OFFICE FOR PROCESSING.  DO NOT GIVE THEM TO YOUR DOCTOR.  A prescription for pain medication may be given to you upon discharge.  Take your pain medication as prescribed, if needed.  If narcotic pain medicine is not needed, then you may take acetaminophen (Tylenol) or ibuprofen (Advil) as needed. Take your usually prescribed medications unless otherwise directed If you need a refill on your pain medication, please contact your pharmacy.  They will contact our office to request authorization.  Prescriptions will not be filled after 5pm or on week-ends. You should eat very light the first 24 hours after surgery, such as soup, crackers, pudding, etc.  Resume your normal diet the day after surgery. Most patients will experience some swelling and bruising in the breast.  Ice packs and a good support bra will help.  Swelling and bruising can take several days to resolve.  It is common to experience some constipation if taking pain medication after surgery.  Increasing fluid intake and taking a stool softener will usually help or prevent this problem from occurring.  A mild laxative (Milk of Magnesia or Miralax) should be taken according to package directions if there are no bowel movements after 48 hours. Unless discharge instructions indicate otherwise, you may remove your bandages 24-48 hours after surgery, and you may shower at that time.  You may have steri-strips (small skin tapes) in place directly over the incision.  These strips should be left on the skin for 7-10 days.  If your surgeon used skin glue on the incision, you may shower in 24 hours.  The glue will flake off over the next 2-3 weeks.  Any  sutures or staples will be removed at the office during your follow-up visit. ACTIVITIES:  You may resume regular daily activities (gradually increasing) beginning the next day.  Wearing a good support bra or sports bra minimizes pain and swelling.  You may have sexual intercourse when it is comfortable. You may drive when you no longer are taking prescription pain medication, you can comfortably wear a seatbelt, and you can safely maneuver your car and apply brakes. RETURN TO WORK:  ______________________________________________________________________________________ You should see your doctor in the office for a follow-up appointment approximately two weeks after your surgery.  Your doctor's nurse will typically make your follow-up appointment when she calls you with your pathology report.  Expect your pathology report 2-3 business days after your surgery.  You may call to check if you do not hear from us after three days. OTHER INSTRUCTIONS: _______________________________________________________________________________________________ _____________________________________________________________________________________________________________________________________ _____________________________________________________________________________________________________________________________________ _____________________________________________________________________________________________________________________________________  WHEN TO CALL YOUR DOCTOR: Fever over 101.0 Nausea and/or vomiting. Extreme swelling or bruising. Continued bleeding from incision. Increased pain, redness, or drainage from the incision.  The clinic staff is available to answer your questions during regular business hours.  Please don't hesitate to call and ask to speak to one of the nurses for clinical concerns.  If you have a medical emergency, go to the nearest emergency room or call 911.  A surgeon from [facility] is always on call at the hospital.  For further questions, please visit [email or web address]   

## 2021-08-13 ENCOUNTER — Encounter (HOSPITAL_COMMUNITY): Payer: Self-pay | Admitting: Surgery

## 2021-08-16 ENCOUNTER — Encounter: Payer: Self-pay | Admitting: Genetic Counselor

## 2021-08-16 ENCOUNTER — Telehealth: Payer: Self-pay | Admitting: Genetic Counselor

## 2021-08-16 ENCOUNTER — Ambulatory Visit: Payer: Self-pay | Admitting: Surgery

## 2021-08-16 ENCOUNTER — Encounter: Payer: Self-pay | Admitting: *Deleted

## 2021-08-16 DIAGNOSIS — Z1379 Encounter for other screening for genetic and chromosomal anomalies: Secondary | ICD-10-CM | POA: Insufficient documentation

## 2021-08-16 LAB — SURGICAL PATHOLOGY

## 2021-08-16 NOTE — Telephone Encounter (Signed)
I contacted Ms. Wyly to discuss her genetic testing results. No pathogenic variants were identified in the 77 genes analyzed. Detailed clinic note to follow.  The test report has been scanned into EPIC and is located under the Molecular Pathology section of the Results Review tab.  A portion of the result report is included below for reference.   Lucille Passy, MS, [facility] Genetic Counselor [email or web address] (P) [phone]

## 2021-08-17 ENCOUNTER — Encounter: Payer: Self-pay | Admitting: *Deleted

## 2021-08-17 ENCOUNTER — Ambulatory Visit: Payer: Self-pay | Admitting: Genetic Counselor

## 2021-08-17 DIAGNOSIS — Z1379 Encounter for other screening for genetic and chromosomal anomalies: Secondary | ICD-10-CM

## 2021-08-17 NOTE — Progress Notes (Signed)
HPI:   Ms. Dacosta was previously seen in the [facility] clinic due to a personal history of cancer and concerns regarding a hereditary predisposition to cancer. Please refer to our prior cancer genetics clinic note for more information regarding our discussion, assessment and recommendations, at the time. Ms. Foronda recent genetic test results were disclosed to her, as were recommendations warranted by these results. These results and recommendations are discussed in more detail below.  CANCER HISTORY:  Oncology History Overview Note   Cancer Staging  Malignant neoplasm of upper-outer quadrant of left breast in female, estrogen receptor positive ([place]) Staging form: Breast, AJCC 8th Edition - Clinical stage from 07/20/2021: Stage IA (cT1c, cN0, cM0, G1, ER+, PR+, HER2-) - Signed by Truitt Merle, MD on 07/27/2021    Malignant neoplasm of upper-outer quadrant of left breast in female, estrogen receptor positive ([place])  07/15/2021 Mammogram   Exam: Breast Ultrasound - L  There is an irregular mass with a spiculated margin in the left breast at 12 o'clock posterior depth 8 cm from the nipple. It measures 1.4 x 1.1 x 0.9 cm. This irregular mass is hypoechoic. This correlated with mammography findings.  Ultrasound of the left axilla is negative for lymphadenopathy. Multiple normal lymph nodes are seen.  IMPRESSION: The irregular mass in the left breast is highly suggestive of malignancy.   07/20/2021 Cancer Staging   Staging form: Breast, AJCC 8th Edition - Clinical stage from 07/20/2021: Stage IA (cT1c, cN0, cM0, G1, ER+, PR+, HER2-) - Signed by Truitt Merle, MD on 07/27/2021 Stage prefix: Initial diagnosis Histologic grading system: 3 grade system    07/20/2021 Initial Biopsy   Diagnosis Breast, left, needle core biopsy, left breast mass 12:00 8 cmfn - INVASIVE DUCTAL CARCINOMA - DUCTAL CARCINOMA IN SITU - SEE COMMENT Microscopic Comment Based on the biopsy, the carcinoma appears  Nottingham grade 1-2 of 3 and measures 0.5 cm in greatest linear extent.  PROGNOSTIC INDICATORS Results: Estrogen Receptor: 100%, POSITIVE, STRONG STAINING INTENSITY Progesterone Receptor: 95%, POSITIVE, STRONG STAINING INTENSITY Proliferation Marker Ki67: 5%  FLUORESCENCE IN-SITU HYBRIDIZATION Results: GROUP 5: HER2 **NEGATIVE**   07/26/2021 Initial Diagnosis   Malignant neoplasm of upper-outer quadrant of left breast in female, estrogen receptor positive ([place])    Genetic Testing   [facility] CancerNext-Expanded Panel was Negative. Report date is 08/08/2021.  The CancerNext-Expanded gene panel offered by [facility] and includes sequencing, rearrangement, and RNA analysis for the following 77 genes: AIP, ALK, APC, ATM, AXIN2, BAP1, BARD1, BLM, BMPR1A, BRCA1, BRCA2, BRIP1, CDC73, CDH1, CDK4, CDKN1B, CDKN2A, CHEK2, CTNNA1, DICER1, FANCC, FH, FLCN, GALNT12, KIF1B, LZTR1, MAX, MEN1, MET, MLH1, MSH2, MSH3, MSH6, MUTYH, NBN, NF1, NF2, NTHL1, PALB2, PHOX2B, PMS2, POT1, PRKAR1A, PTCH1, PTEN, RAD51C, RAD51D, RB1, RECQL, RET, SDHA, SDHAF2, SDHB, SDHC, SDHD, SMAD4, SMARCA4, SMARCB1, SMARCE1, STK11, SUFU, TMEM127, TP53, TSC1, TSC2, VHL and XRCC2 (sequencing and deletion/duplication); EGFR, EGLN1, HOXB13, KIT, MITF, PDGFRA, POLD1, and POLE (sequencing only); EPCAM and GREM1 (deletion/duplication only).      FAMILY HISTORY:  We obtained a detailed, 4-generation family history.  Significant diagnoses are listed below:      Family History  Problem Relation Age of Onset   Heart Problems Mother     CVA Mother     Heart attack Mother 34   CVA Father     Colon cancer Neg Hx     Clotting disorder Neg Hx           Ms. Bellmore is unaware of previous family history of  cancer and/or hereditary cancer genetic testing. There is no reported Ashkenazi Jewish ancestry.    GENETIC TEST RESULTS:  The [facility] CancerNext-Expanded Panel found no pathogenic mutations.   The CancerNext-Expanded gene panel offered by  [facility] and includes sequencing, rearrangement, and RNA analysis for the following 77 genes: AIP, ALK, APC, ATM, AXIN2, BAP1, BARD1, BLM, BMPR1A, BRCA1, BRCA2, BRIP1, CDC73, CDH1, CDK4, CDKN1B, CDKN2A, CHEK2, CTNNA1, DICER1, FANCC, FH, FLCN, GALNT12, KIF1B, LZTR1, MAX, MEN1, MET, MLH1, MSH2, MSH3, MSH6, MUTYH, NBN, NF1, NF2, NTHL1, PALB2, PHOX2B, PMS2, POT1, PRKAR1A, PTCH1, PTEN, RAD51C, RAD51D, RB1, RECQL, RET, SDHA, SDHAF2, SDHB, SDHC, SDHD, SMAD4, SMARCA4, SMARCB1, SMARCE1, STK11, SUFU, TMEM127, TP53, TSC1, TSC2, VHL and XRCC2 (sequencing and deletion/duplication); EGFR, EGLN1, HOXB13, KIT, MITF, PDGFRA, POLD1, and POLE (sequencing only); EPCAM and GREM1 (deletion/duplication only).    The test report has been scanned into EPIC and is located under the Molecular Pathology section of the Results Review tab.  A portion of the result report is included below for reference. Genetic testing reported out on 08/08/2021.       Even though a pathogenic variant was not identified, possible explanations for her personal history of cancer may include: There may be no hereditary risk for cancer in the family. Ms. Leduc breast cancer may be due to other genetic or environmental factors. There may be a gene mutation in one of these genes that current testing methods cannot detect, but that chance is small. There could be another gene that has not yet been discovered, or that we have not yet tested, that is responsible for the cancer diagnoses in the family.   Therefore, it is important to remain in touch with cancer genetics in the future so that we can continue to offer Ms. Figge the most up to date genetic testing.   ADDITIONAL GENETIC TESTING:  We discussed with Ms. Riches that her genetic testing was fairly extensive.  If there are genes identified to increase cancer risk that can be analyzed in the future, we would be happy to discuss and coordinate this testing at that time.    CANCER  SCREENING RECOMMENDATIONS:  Ms. Botkins test result is considered negative (normal).  This means that we have not identified a hereditary cause for her personal history of cancer at this time. Most cancers happen by chance and this negative test suggests that her cancer may fall into this category.    An individual's cancer risk and medical management are not determined by genetic test results alone. Overall cancer risk assessment incorporates additional factors, including personal medical history, family history, and any available genetic information that may result in a personalized plan for cancer prevention and surveillance. Therefore, it is recommended she continue to follow the cancer management and screening guidelines provided by her oncology and primary healthcare provider.  RECOMMENDATIONS FOR FAMILY MEMBERS:   Since she did not inherit a mutation in a cancer predisposition gene included on this panel, her children could not have inherited a mutation from her in one of these genes.  FOLLOW-UP:  Cancer genetics is a rapidly advancing field and it is possible that new genetic tests will be appropriate for her and/or her family members in the future. We encouraged her to remain in contact with cancer genetics on an annual basis so we can update her personal and family histories and let her know of advances in cancer genetics that may benefit this family.   Our contact number was provided. Ms. Wisdom questions were answered to  her satisfaction, and she knows she is welcome to call us at anytime with additional questions or concerns.   Lucille Passy, MS, [facility] Genetic Counselor [email or web address] (P) [phone]

## 2021-08-24 ENCOUNTER — Other Ambulatory Visit: Payer: Self-pay

## 2021-08-24 ENCOUNTER — Encounter (HOSPITAL_COMMUNITY): Payer: Self-pay | Admitting: Surgery

## 2021-08-24 NOTE — Anesthesia Preprocedure Evaluation (Addendum)
Anesthesia Evaluation  ?Patient identified by MRN, date of birth, ID band ?Patient awake ? ? ? ?Reviewed: ?Allergy & Precautions, NPO status , Patient's Chart, lab work & pertinent test results, reviewed documented beta blocker date and time  ? ?History of Anesthesia Complications ?(+) PONV and history of anesthetic complications ? ?Airway ?Mallampati: II ? ?TM Distance: >3 FB ?Neck ROM: Full ? ? ? Dental ?no notable dental hx. ?(+) Dental Advisory Given ?  ?Pulmonary ?neg pulmonary ROS,  ?  ?Pulmonary exam normal ? ? ? ? ? ? ? Cardiovascular ?hypertension, Pt. on medications and Pt. on home beta blockers ?Normal cardiovascular exam+ dysrhythmias Atrial Fibrillation  ? ?Echo 04/30/21: ?IMPRESSIONS  ??1. Left ventricular ejection fraction, by estimation, is 65 to 70%. The  ?left ventricle has normal function. The left ventricle has no regional  ?wall motion abnormalities. There is moderate asymmetric left ventricular  ?hypertrophy of the basal-septal  ?segment. Left ventricular diastolic parameters were normal.  ??2. Right ventricular systolic function is normal. The right ventricular  ?size is mildly enlarged. There is mildly elevated pulmonary artery  ?systolic pressure. The estimated right ventricular systolic pressure is  ?88.9 mmHg.  ??3. Left atrial size was severely dilated.  ??4. Right atrial size was severely dilated.  ??5. The mitral valve is degenerative. Bileaflet prolapse. Moderate mitral  ?valve regurgitation.  ??6. Pulmonic valve regurgitation is mild to moderate.  ??7. The aortic valve is tricuspid. Aortic valve regurgitation is trivial.  ?No aortic stenosis is present.  ?? ?? ?  ?Neuro/Psych ?negative neurological ROS ?   ? GI/Hepatic ?GERD  ,(+) Hepatitis -  ?Endo/Other  ? ? Renal/GU ?Renal disease  ? ?  ?Musculoskeletal ? ?(+) Arthritis ,  ? Abdominal ?  ?Peds ? Hematology ?  ?Anesthesia Other Findings ? ? Reproductive/Obstetrics ? ?  ? ? ? ? ? ? ? ? ? ? ? ? ? ?  ?   ? ? ? ? ? ? ? ?Anesthesia Physical ? ?Anesthesia Plan ? ?ASA: 3 ? ?Anesthesia Plan: General  ? ?Post-op Pain Management:   ? ?Induction: Intravenous ? ?PONV Risk Score and Plan: 4 or greater and Ondansetron, Dexamethasone, Diphenhydramine and TIVA ? ?Airway Management Planned: LMA ? ?Additional Equipment:  ? ?Intra-op Plan:  ? ?Post-operative Plan: Extubation in OR ? ?Informed Consent:  ? ? ? ?Dental advisory given ? ?Plan Discussed with: Anesthesiologist ? ?Anesthesia Plan Comments: (PAT note written 08/11/2021 by Myra Gianotti, PA-C. ?)  ? ? ? ? ? ?Anesthesia Quick Evaluation ? ?

## 2021-08-24 NOTE — Anesthesia Preprocedure Evaluation (Deleted)
Anesthesia Evaluation  ? ? ?Airway ? ? ? ? ? ? ? Dental ?  ?Pulmonary ? ?  ? ? ? ? ? ? ? Cardiovascular ?hypertension,  ? ? ?  ?Neuro/Psych ?  ? GI/Hepatic ?  ?Endo/Other  ? ? Renal/GU ?  ? ?  ?Musculoskeletal ? ? Abdominal ?  ?Peds ? Hematology ?  ?Anesthesia Other Findings ? ? Reproductive/Obstetrics ? ?  ? ? ? ? ? ? ? ? ? ? ? ? ? ?  ?  ? ? ? ? ? ? ? ?                                  Anesthesia Evaluation  ?Patient identified by MRN, date of birth, ID band ?Patient awake ? ? ? ?Reviewed: ?Allergy & Precautions, NPO status , Patient's Chart, lab work & pertinent test results ? ?History of Anesthesia Complications ?(+) PONV and history of anesthetic complications ? ?Airway ?Mallampati: II ? ?TM Distance: >3 FB ? ? ? ? Dental ?  ?Pulmonary ?neg pulmonary ROS,  ?  ?breath sounds clear to auscultation ? ? ? ? ? ? Cardiovascular ?hypertension,  ?Rhythm:Regular Rate:Normal ? ? ?  ?Neuro/Psych ?negative neurological ROS ?   ? GI/Hepatic ?GERD  ,(+) Hepatitis -  ?Endo/Other  ? ? Renal/GU ?Renal disease  ? ?  ?Musculoskeletal ? ?(+) Arthritis ,  ? Abdominal ?  ?Peds ? Hematology ?  ?Anesthesia Other Findings ? ? Reproductive/Obstetrics ? ?  ? ? ? ? ? ? ? ? ? ? ? ? ? ?  ?  ? ? ? ? ? ? ? ?Anesthesia Physical ?Anesthesia Plan ? ?ASA: 3 ? ?Anesthesia Plan: General  ? ?Post-op Pain Management:   ? ?Induction: Intravenous ? ?PONV Risk Score and Plan: 4 or greater and Ondansetron, Dexamethasone and Midazolam ? ?Airway Management Planned: LMA and Oral ETT ? ?Additional Equipment:  ? ?Intra-op Plan:  ? ?Post-operative Plan:  ? ?Informed Consent: I have reviewed the patients History and Physical, chart, labs and discussed the procedure including the risks, benefits and alternatives for the proposed anesthesia with the patient or authorized representative who has indicated his/her understanding and acceptance.  ? ? ? ?Dental advisory given ? ?Plan Discussed with: CRNA and  Anesthesiologist ? ?Anesthesia Plan Comments: (PAT note written 08/11/2021 by Myra Gianotti, PA-C. ?)  ? ? ? ? ? ?Anesthesia Quick Evaluation ? ?Anesthesia Physical ?Anesthesia Plan ? ?ASA:  ? ?Anesthesia Plan:   ? ?Post-op Pain Management:   ? ?Induction:  ? ?PONV Risk Score and Plan:  ? ?Airway Management Planned:  ? ?Additional Equipment:  ? ?Intra-op Plan:  ? ?Post-operative Plan:  ? ?Informed Consent:  ? ?Plan Discussed with:  ? ?Anesthesia Plan Comments: (PAT note written 08/24/2021 by Myra Gianotti, PA-C. ?)  ? ? ? ? ? ? ?Anesthesia Quick Evaluation ? ?

## 2021-08-24 NOTE — Progress Notes (Signed)
Anesthesia Chart Review: ? Case: [id number] Date/Time: 08/25/21 0930  ? Procedure: RE-EXCISION OF BREAST CANCER,SUPERIOR MARGINS LEFT BREAST LUMPECTOMY (Left: Breast)  ? Anesthesia type: General  ? Pre-op diagnosis: LEFT BREAST INVASIVE DUCTAL CARCINOMA  ? Location: [facility] OR ROOM 06 / [place] [facility]  ? Surgeons: Donnie Mesa, MD  ? ?  ? ? ?DISCUSSION: Patient is an 81 year old female scheduled for the above procedure.  She is s/p left breast lumpectomy on 08/12/2021 by Dr. Georgette Dover. Pathology: Invasive and in situ ductal carcinoma, invasive carcinoma involves superior margin, DCIS 0.1 cm from superior margin.  Reexcision of margin planned. ? ?See my previous Anesthesia APP note from Date of Service 08/10/21.  She has a [employer] Reveal Linq implantable loop recorder. ? ?She was advised to hold Eliquis for 2 days (without need for Lovenox bridge) for her previous breast surgery and that is what he is doing for this procedure. She told me her last dose was 08/22/21. ? ?Myra Gianotti, PA-C ?Surgical Short Stay/Anesthesiology ?[facility] Phone [phone] ?[facility] Phone [phone] ?08/24/2021 1:58 PM ?  ? ? ? ?

## 2021-08-24 NOTE — Pre-Procedure Instructions (Signed)
Surgical Instructions ? ? ? Your procedure is scheduled on Wednesday, August 25, 2021 at 9:45 AM. ? Report to [facility] Main Entrance "A" at 7:15 A.M., then check in with the Admitting office. ? Call this number if you have problems the morning of surgery: ? [phone] ? ? If you have any questions prior to your surgery date call [phone]: Open Monday-Friday 8am-4pm ? ? ? Remember: ? Do not eat after midnight the night before your surgery ? ?You may drink clear liquids until 6:45 AM the morning of your surgery.   ?Clear liquids allowed are: Water, Non-Citrus Juices (without pulp), Carbonated Beverages, Clear Tea, Black Coffee Only (NO MILK, CREAM OR POWDERED CREAMER of any kind), and Gatorade. ?  ? Take these medicines the morning of surgery with A SIP OF WATER: ? ?dofetilide (TIKOSYN) ?guaiFENesin [facility]) ?loratadine (CLARITIN)  ?pantoprazole (PROTONIX) ?propranolol (INDERAL)  ? ?Follow your surgeon's instructions on when to stop apixaban (ELIQUIS).  If no instructions were given by your surgeon then you will need to call the office to get those instructions.   ? ? ?As of today, STOP taking any Aspirin (unless otherwise instructed by your surgeon) Aleve, Naproxen, Ibuprofen, Motrin, Advil, Goody's, BC's, all herbal medications, fish oil, and all vitamins. ?         ?           ?Do NOT Smoke (Tobacco/Vaping) for 24 hours prior to your procedure. ? ?If you use a CPAP at night, you may bring your mask/headgear for your overnight stay. ?  ?Contacts, glasses, piercing's, hearing aid's, dentures or partials may not be worn into surgery, please bring cases for these belongings.  ?  ?For patients admitted to the hospital, discharge time will be determined by your treatment team. ?  ?Patients discharged the day of surgery will not be allowed to drive home, and someone needs to stay with them for 24 hours. ? ?NO VISITORS WILL BE ALLOWED IN PRE-OP WHERE PATIENTS ARE PREPPED FOR SURGERY.  ONLY 1 SUPPORT PERSON MAY BE  PRESENT IN THE WAITING ROOM WHILE YOU ARE IN SURGERY.  IF YOU ARE TO BE ADMITTED, ONCE YOU ARE IN YOUR ROOM YOU WILL BE ALLOWED TWO (2) VISITORS. (1) VISITOR MAY STAY OVERNIGHT BUT MUST ARRIVE TO THE ROOM BY 8pm.  Minor children may have two parents present. Special consideration for safety and communication needs will be reviewed on a case by case basis. ? ? ?Day of Surgery: ?Take a shower with mild soap. ?Do not wear jewelry or makeup ?Do not wear lotions, powders, perfumes, or deodorant. ?Do not shave 48 hours prior to surgery. ?Do not bring valuables to the hospital.  ?[facility] is not responsible for any belongings or valuables. ?Do not wear nail polish, gel polish, artificial nails, or any other type of covering on natural nails (fingers and toes) ?If you have artificial nails or gel coating that need to be removed by a nail salon, please have this removed prior to surgery. Artificial nails or gel coating may interfere with anesthesia's ability to adequately monitor your vital signs. ?Wear Clean/Comfortable clothing the morning of surgery ?Do not apply any deodorants/lotions.   ?Remember to brush your teeth WITH YOUR REGULAR TOOTHPASTE. ?  ?Please read over the following fact sheets that you were given. ?

## 2021-08-25 ENCOUNTER — Ambulatory Visit (HOSPITAL_COMMUNITY)
Admission: RE | Admit: 2021-08-25 | Discharge: 2021-08-25 | Disposition: A | Discharge status: Home / Self Care | Payer: PPO | Source: Ambulatory Visit | Attending: Surgery | Admitting: Surgery

## 2021-08-25 ENCOUNTER — Ambulatory Visit (HOSPITAL_COMMUNITY): Payer: PPO | Admitting: Vascular Surgery

## 2021-08-25 ENCOUNTER — Other Ambulatory Visit: Payer: Self-pay

## 2021-08-25 ENCOUNTER — Ambulatory Visit (HOSPITAL_BASED_OUTPATIENT_CLINIC_OR_DEPARTMENT_OTHER): Payer: PPO | Admitting: Vascular Surgery

## 2021-08-25 ENCOUNTER — Encounter (HOSPITAL_COMMUNITY): Payer: Self-pay | Admitting: Surgery

## 2021-08-25 ENCOUNTER — Encounter (HOSPITAL_COMMUNITY)
Admission: RE | Disposition: A | Discharge status: Home / Self Care | Payer: Self-pay | Source: Ambulatory Visit | Attending: Surgery

## 2021-08-25 DIAGNOSIS — M1990 Unspecified osteoarthritis, unspecified site: Secondary | ICD-10-CM | POA: Insufficient documentation

## 2021-08-25 DIAGNOSIS — I130 Hypertensive heart and chronic kidney disease with heart failure and stage 1 through stage 4 chronic kidney disease, or unspecified chronic kidney disease: Secondary | ICD-10-CM | POA: Diagnosis not present

## 2021-08-25 DIAGNOSIS — Y838 Other surgical procedures as the cause of abnormal reaction of the patient, or of later complication, without mention of misadventure at the time of the procedure: Secondary | ICD-10-CM | POA: Diagnosis not present

## 2021-08-25 DIAGNOSIS — I10 Essential (primary) hypertension: Secondary | ICD-10-CM

## 2021-08-25 DIAGNOSIS — N182 Chronic kidney disease, stage 2 (mild): Secondary | ICD-10-CM | POA: Diagnosis not present

## 2021-08-25 DIAGNOSIS — L7634 Postprocedural seroma of skin and subcutaneous tissue following other procedure: Secondary | ICD-10-CM | POA: Diagnosis not present

## 2021-08-25 DIAGNOSIS — Z79899 Other long term (current) drug therapy: Secondary | ICD-10-CM | POA: Insufficient documentation

## 2021-08-25 DIAGNOSIS — I4819 Other persistent atrial fibrillation: Secondary | ICD-10-CM | POA: Insufficient documentation

## 2021-08-25 DIAGNOSIS — I4891 Unspecified atrial fibrillation: Secondary | ICD-10-CM

## 2021-08-25 DIAGNOSIS — N289 Disorder of kidney and ureter, unspecified: Secondary | ICD-10-CM | POA: Diagnosis not present

## 2021-08-25 DIAGNOSIS — C50912 Malignant neoplasm of unspecified site of left female breast: Secondary | ICD-10-CM

## 2021-08-25 DIAGNOSIS — Z170 Estrogen receptor positive status [ER+]: Secondary | ICD-10-CM | POA: Insufficient documentation

## 2021-08-25 DIAGNOSIS — Z95818 Presence of other cardiac implants and grafts: Secondary | ICD-10-CM | POA: Diagnosis not present

## 2021-08-25 DIAGNOSIS — C50412 Malignant neoplasm of upper-outer quadrant of left female breast: Secondary | ICD-10-CM | POA: Diagnosis not present

## 2021-08-25 DIAGNOSIS — Z8619 Personal history of other infectious and parasitic diseases: Secondary | ICD-10-CM | POA: Insufficient documentation

## 2021-08-25 DIAGNOSIS — I5032 Chronic diastolic (congestive) heart failure: Secondary | ICD-10-CM | POA: Diagnosis not present

## 2021-08-25 DIAGNOSIS — K219 Gastro-esophageal reflux disease without esophagitis: Secondary | ICD-10-CM | POA: Diagnosis not present

## 2021-08-25 HISTORY — PX: RE-EXCISION OF BREAST CANCER,SUPERIOR MARGINS: SHX6047

## 2021-08-25 SURGERY — RE-EXCISION OF BREAST CANCER,SUPERIOR MARGINS
Anesthesia: General | Site: Breast | Laterality: Left

## 2021-08-25 MED ORDER — ONDANSETRON HCL 4 MG/2ML IJ SOLN
INTRAMUSCULAR | Status: DC | PRN
  Administered 2021-08-25: 4 mg via INTRAVENOUS

## 2021-08-25 MED ORDER — PROPOFOL 500 MG/50ML IV EMUL
INTRAVENOUS | Status: DC | PRN
  Administered 2021-08-25: 80 ug/kg/min via INTRAVENOUS

## 2021-08-25 MED ORDER — 0.9 % SODIUM CHLORIDE (POUR BTL) OPTIME
TOPICAL | Status: DC | PRN
  Administered 2021-08-25: 500 mL

## 2021-08-25 MED ORDER — DEXAMETHASONE SODIUM PHOSPHATE 10 MG/ML IJ SOLN
INTRAMUSCULAR | Status: DC | PRN
  Administered 2021-08-25: 4 mg via INTRAVENOUS

## 2021-08-25 MED ORDER — PROPOFOL 10 MG/ML IV BOLUS
INTRAVENOUS | Status: AC
  Filled 2021-08-25: qty 20

## 2021-08-25 MED ORDER — ORAL CARE MOUTH RINSE: 15.0000 mL | Freq: Once | OROMUCOSAL | Status: AC

## 2021-08-25 MED ORDER — LIDOCAINE 2% (20 MG/ML) 5 ML SYRINGE
INTRAMUSCULAR | Status: DC | PRN
  Administered 2021-08-25: 100 mg via INTRAVENOUS

## 2021-08-25 MED ORDER — CEFAZOLIN SODIUM-DEXTROSE 2-4 GM/100ML-% IV SOLN
2.0000 g | INTRAVENOUS | Status: AC
  Administered 2021-08-25: 2 g via INTRAVENOUS
  Filled 2021-08-25: qty 100

## 2021-08-25 MED ORDER — FENTANYL CITRATE (PF) 250 MCG/5ML IJ SOLN
INTRAMUSCULAR | Status: AC
  Filled 2021-08-25: qty 5

## 2021-08-25 MED ORDER — ACETAMINOPHEN 500 MG PO TABS
1000.0000 mg | ORAL_TABLET | ORAL | Status: AC
  Administered 2021-08-25: 1000 mg via ORAL
  Filled 2021-08-25: qty 2

## 2021-08-25 MED ORDER — CHLORHEXIDINE GLUCONATE CLOTH 2 % EX PADS: 6.0000 | MEDICATED_PAD | Freq: Once | CUTANEOUS | Status: DC

## 2021-08-25 MED ORDER — CHLORHEXIDINE GLUCONATE 0.12 % MT SOLN
15.0000 mL | Freq: Once | OROMUCOSAL | Status: AC
  Administered 2021-08-25: 15 mL via OROMUCOSAL
  Filled 2021-08-25: qty 15

## 2021-08-25 MED ORDER — BUPIVACAINE-EPINEPHRINE (PF) 0.25% -1:200000 IJ SOLN
INTRAMUSCULAR | Status: AC
  Filled 2021-08-25: qty 30

## 2021-08-25 MED ORDER — LACTATED RINGERS IV SOLN: INTRAVENOUS | Status: DC

## 2021-08-25 MED ORDER — BUPIVACAINE-EPINEPHRINE 0.25% -1:200000 IJ SOLN
INTRAMUSCULAR | Status: DC | PRN
  Administered 2021-08-25: 10 mL

## 2021-08-25 MED ORDER — FENTANYL CITRATE (PF) 250 MCG/5ML IJ SOLN
INTRAMUSCULAR | Status: DC | PRN
  Administered 2021-08-25 (×2): 25 ug via INTRAVENOUS
  Administered 2021-08-25: 50 ug via INTRAVENOUS

## 2021-08-25 MED ORDER — PROPOFOL 10 MG/ML IV BOLUS
INTRAVENOUS | Status: DC | PRN
  Administered 2021-08-25: 120 mg via INTRAVENOUS

## 2021-08-25 MED ORDER — PHENYLEPHRINE HCL-NACL 20-0.9 MG/250ML-% IV SOLN
INTRAVENOUS | Status: DC | PRN
  Administered 2021-08-25: 25 ug/min via INTRAVENOUS

## 2021-08-25 SURGICAL SUPPLY — 43 items
APL PRP STRL LF DISP 70% ISPRP (MISCELLANEOUS) ×1
APL SKNCLS STERI-STRIP NONHPOA (GAUZE/BANDAGES/DRESSINGS) ×1
APPLIER CLIP 9.375 MED OPEN (MISCELLANEOUS) ×2
APR CLP MED 9.3 20 MLT OPN (MISCELLANEOUS) ×1
BAG COUNTER SPONGE SURGICOUNT (BAG) ×2 IMPLANT
BAG SPNG CNTER NS LX DISP (BAG) ×1
BENZOIN TINCTURE PRP APPL 2/3 (GAUZE/BANDAGES/DRESSINGS) ×2 IMPLANT
BINDER BREAST LRG (GAUZE/BANDAGES/DRESSINGS) IMPLANT
BINDER BREAST XLRG (GAUZE/BANDAGES/DRESSINGS) IMPLANT
CANISTER SUCT 3000ML PPV (MISCELLANEOUS) ×2 IMPLANT
CHLORAPREP W/TINT 26 (MISCELLANEOUS) ×2 IMPLANT
CLIP APPLIE 9.375 MED OPEN (MISCELLANEOUS) IMPLANT
COVER PROBE W GEL 5X96 (DRAPES) ×1 IMPLANT
COVER SURGICAL LIGHT HANDLE (MISCELLANEOUS) ×2 IMPLANT
DEVICE DUBIN SPECIMEN MAMMOGRA (MISCELLANEOUS) ×1 IMPLANT
DRAPE CHEST BREAST 15X10 FENES (DRAPES) ×2 IMPLANT
DRSG TEGADERM 4X4.75 (GAUZE/BANDAGES/DRESSINGS) ×2 IMPLANT
ELECT CAUTERY BLADE 6.4 (BLADE) ×2 IMPLANT
ELECT REM PT RETURN 9FT ADLT (ELECTROSURGICAL) ×2
ELECTRODE REM PT RTRN 9FT ADLT (ELECTROSURGICAL) ×1 IMPLANT
GAUZE SPONGE 2X2 8PLY STRL LF (GAUZE/BANDAGES/DRESSINGS) ×1 IMPLANT
GLOVE SURG ENC MOIS LTX SZ7 (GLOVE) ×2 IMPLANT
GLOVE SURG UNDER POLY LF SZ7.5 (GLOVE) ×2 IMPLANT
GOWN STRL REUS W/ TWL LRG LVL3 (GOWN DISPOSABLE) ×2 IMPLANT
GOWN STRL REUS W/TWL LRG LVL3 (GOWN DISPOSABLE) ×4
ILLUMINATOR WAVEGUIDE N/F (MISCELLANEOUS) IMPLANT
KIT BASIN OR (CUSTOM PROCEDURE TRAY) ×2 IMPLANT
KIT MARKER MARGIN INK (KITS) ×2 IMPLANT
LIGHT WAVEGUIDE WIDE FLAT (MISCELLANEOUS) IMPLANT
NDL HYPO 25GX1X1/2 BEV (NEEDLE) ×1 IMPLANT
NEEDLE HYPO 25GX1X1/2 BEV (NEEDLE) ×2 IMPLANT
NS IRRIG 1000ML POUR BTL (IV SOLUTION) ×2 IMPLANT
PACK GENERAL/GYN (CUSTOM PROCEDURE TRAY) ×2 IMPLANT
SPONGE GAUZE 2X2 STER 10/PKG (GAUZE/BANDAGES/DRESSINGS) ×1
SPONGE T-LAP 4X18 ~~LOC~~+RFID (SPONGE) ×2 IMPLANT
STRIP CLOSURE SKIN 1/2X4 (GAUZE/BANDAGES/DRESSINGS) ×2 IMPLANT
SUT MNCRL AB 4-0 PS2 18 (SUTURE) ×2 IMPLANT
SUT SILK 2 0 SH (SUTURE) IMPLANT
SUT VIC AB 3-0 SH 27 (SUTURE) ×2
SUT VIC AB 3-0 SH 27X BRD (SUTURE) ×1 IMPLANT
SYR CONTROL 10ML LL (SYRINGE) ×2 IMPLANT
TOWEL GREEN STERILE (TOWEL DISPOSABLE) ×2 IMPLANT
TOWEL GREEN STERILE FF (TOWEL DISPOSABLE) ×2 IMPLANT

## 2021-08-25 NOTE — Discharge Instructions (Signed)
[facility] Office Phone Number [phone]  BREAST BIOPSY/ PARTIAL MASTECTOMY: POST OP INSTRUCTIONS  Always review your discharge instruction sheet given to you by the facility where your surgery was performed.  IF YOU HAVE DISABILITY OR FAMILY LEAVE FORMS, YOU MUST BRING THEM TO THE OFFICE FOR PROCESSING.  DO NOT GIVE THEM TO YOUR DOCTOR.  A prescription for pain medication may be given to you upon discharge.  Take your pain medication as prescribed, if needed.  If narcotic pain medicine is not needed, then you may take acetaminophen (Tylenol) or ibuprofen (Advil) as needed. Take your usually prescribed medications unless otherwise directed If you need a refill on your pain medication, please contact your pharmacy.  They will contact our office to request authorization.  Prescriptions will not be filled after 5pm or on week-ends. You should eat very light the first 24 hours after surgery, such as soup, crackers, pudding, etc.  Resume your normal diet the day after surgery. Most patients will experience some swelling and bruising in the breast.  Ice packs and a good support bra will help.  Swelling and bruising can take several days to resolve.  It is common to experience some constipation if taking pain medication after surgery.  Increasing fluid intake and taking a stool softener will usually help or prevent this problem from occurring.  A mild laxative (Milk of Magnesia or Miralax) should be taken according to package directions if there are no bowel movements after 48 hours. Unless discharge instructions indicate otherwise, you may remove your bandages 24-48 hours after surgery, and you may shower at that time.  You may have steri-strips (small skin tapes) in place directly over the incision.  These strips should be left on the skin for 7-10 days.  If your surgeon used skin glue on the incision, you may shower in 24 hours.  The glue will flake off over the next 2-3 weeks.  Any  sutures or staples will be removed at the office during your follow-up visit. ACTIVITIES:  You may resume regular daily activities (gradually increasing) beginning the next day.  Wearing a good support bra or sports bra minimizes pain and swelling.  You may have sexual intercourse when it is comfortable. You may drive when you no longer are taking prescription pain medication, you can comfortably wear a seatbelt, and you can safely maneuver your car and apply brakes. RETURN TO WORK:  ______________________________________________________________________________________ You should see your doctor in the office for a follow-up appointment approximately two weeks after your surgery.  Your doctor's nurse will typically make your follow-up appointment when she calls you with your pathology report.  Expect your pathology report 2-3 business days after your surgery.  You may call to check if you do not hear from us after three days. OTHER INSTRUCTIONS: _______________________________________________________________________________________________ _____________________________________________________________________________________________________________________________________ _____________________________________________________________________________________________________________________________________ _____________________________________________________________________________________________________________________________________  WHEN TO CALL YOUR DOCTOR: Fever over 101.0 Nausea and/or vomiting. Extreme swelling or bruising. Continued bleeding from incision. Increased pain, redness, or drainage from the incision.  The clinic staff is available to answer your questions during regular business hours.  Please don't hesitate to call and ask to speak to one of the nurses for clinical concerns.  If you have a medical emergency, go to the nearest emergency room or call 911.  A surgeon from [facility] is always on call at the hospital.  For further questions, please visit [email or web address]   

## 2021-08-25 NOTE — Transfer of Care (Signed)
Immediate Anesthesia Transfer of Care Note ? ?Patient: Christy [name] ? ?Procedure(s) Performed: RE-EXCISION OF SUPERIOR MARGIN -  LEFT BREAST LUMPECTOMY (Left: Breast) ? ?Patient Location: PACU ? ?Anesthesia Type:General ? ?Level of Consciousness: awake, alert  and oriented ? ?Airway & Oxygen Therapy: Patient Spontanous Breathing ? ?Post-op Assessment: Report given to RN and Post -op Vital signs reviewed and stable ? ?Post vital signs: Reviewed and stable ? ?Last Vitals:  ?Vitals Value Taken Time  ?BP 153/72 08/25/21 1036  ?Temp    ?Pulse 51 08/25/21 1037  ?Resp 8 08/25/21 1037  ?SpO2 100 % 08/25/21 1037  ?Vitals shown include unvalidated device data. ? ?Last Pain:  ?Vitals:  ? 08/25/21 0743  ?TempSrc:   ?PainSc: 0-No pain  ?   ? ?  ? ?Complications: No notable events documented. ?

## 2021-08-25 NOTE — Interval H&P Note (Signed)
History and Physical Interval Note: ? ?08/25/2021 ?8:23 AM ? ?The lumpectomy on 08/12/21 revealed a positive superior margin.  She presents now for reexcision of that superior margin on the left side. ? ?Christy [name]  has presented today for surgery, with the diagnosis of LEFT BREAST INVASIVE DUCTAL CARCINOMA.  The various methods of treatment have been discussed with the patient and family. After consideration of risks, benefits and other options for treatment, the patient has consented to  Procedure(s): ?RE-EXCISION OF BREAST CANCER,SUPERIOR MARGINS LEFT BREAST LUMPECTOMY (Left) as a surgical intervention.  The patient's history has been reviewed, patient examined, no change in status, stable for surgery.  I have reviewed the patient's chart and labs.  Questions were answered to the patient's satisfaction.   ? ? ?Christy [name] ? ? ?

## 2021-08-25 NOTE — Op Note (Signed)
Preop diagnosis: Invasive ductal carcinoma left breast with positive superior margin ?Postop diagnosis: Same ?Procedure performed: Reexcision of the left breast lumpectomy superior margin ?Surgeon:  Maia Petties ?Anesthesia: General via LMA ?Indications: This is an 81 year old female who was recently diagnosed with invasive ductal carcinoma of the left breast.  She underwent lumpectomy on 08/12/21.  Pathology revealed a focally positive superior margin.  She presents now for reexcision. ? ?Description of procedure: The patient is brought to the operating room placed in the supine position on the operating room table.  After an adequate level of general anesthesia was obtained, her left breast was prepped with ChloraPrep and draped sterile fashion.  A timeout was taken to ensure the proper patient and proper procedure.  We infiltrated the area around her incision with local anesthetic.  A scalpel was used to reopen her incision.  We dissected into the seroma cavity which was evacuated.  I then took an additional 1 cm thickness of the superior margin down to the chest wall.  This specimen was oriented with the paint kit.  This was sent for pathologic examination.  We placed clips for orientation for radiation therapy.  We inspected for hemostasis.  The wound was closed with a deep layer of 3-0 Vicryl and a subcuticular layer 4-0 Monocryl.  Benzoin and Steri-Strips were applied.  The patient was then extubated and brought to the recovery room in stable condition.  All sponge, instrument, and needle counts are correct. ? ?Imogene Burn. Keaja Reaume, MD, FACS ?[facility]  ?General Surgery ? ? ?08/25/2021 ?10:29 AM ? ?

## 2021-08-25 NOTE — Anesthesia Postprocedure Evaluation (Signed)
Anesthesia Post Note ? ?Patient: Christy [name] ? ?Procedure(s) Performed: RE-EXCISION OF SUPERIOR MARGIN -  LEFT BREAST LUMPECTOMY (Left: Breast) ? ?  ? ?Patient location during evaluation: PACU ?Anesthesia Type: General ?Level of consciousness: sedated ?Pain management: pain level controlled ?Vital Signs Assessment: post-procedure vital signs reviewed and stable ?Respiratory status: spontaneous breathing and respiratory function stable ?Cardiovascular status: stable ?Postop Assessment: no apparent nausea or vomiting ?Anesthetic complications: no ? ? ?No notable events documented. ? ?Last Vitals:  ?Vitals:  ? 08/25/21 1100 08/25/21 1105  ?BP: (!) 170/77 (!) 170/77  ?Pulse: (!) 49 (!) 50  ?Resp: (!) 8 14  ?Temp: 36.9 ?C   ?SpO2: 98% 97%  ?  ?Last Pain:  ?Vitals:  ? 08/25/21 1050  ?TempSrc:   ?PainSc: 0-No pain  ? ? ?  ?  ?  ?  ?  ?  ? ?Phinehas Grounds DANIEL ? ? ? ? ?

## 2021-08-25 NOTE — Anesthesia Procedure Notes (Signed)
Procedure Name: LMA Insertion ?Date/Time: 08/25/2021 9:56 AM ?Performed by: Clearnce Sorrel, CRNA ?Pre-anesthesia Checklist: Patient identified, Emergency Drugs available, Suction available and Patient being monitored ?Patient Re-evaluated:Patient Re-evaluated prior to induction ?Oxygen Delivery Method: Circle System Utilized ?Preoxygenation: Pre-oxygenation with 100% oxygen ?Induction Type: IV induction ?Ventilation: Mask ventilation without difficulty ?LMA: LMA inserted ?LMA Size: 4.0 ?Number of attempts: 1 ?Airway Equipment and Method: Bite block ?Placement Confirmation: positive ETCO2 ?Tube secured with: Tape ?Dental Injury: Teeth and Oropharynx as per pre-operative assessment  ? ? ? ? ?

## 2021-08-26 ENCOUNTER — Encounter (HOSPITAL_COMMUNITY): Payer: Self-pay | Admitting: Surgery

## 2021-08-26 ENCOUNTER — Telehealth: Payer: Self-pay

## 2021-08-26 LAB — SURGICAL PATHOLOGY

## 2021-08-26 NOTE — Telephone Encounter (Signed)
Unscheduled transmission, normal function ?RRT 1/17 ?1 new AF event, duration 1hr, mean HR 97 ?Burden 0.6%, Eliquis ?Route to triage ?LA ? ?Successful telephone encounter to patient to discuss loop recorder at RRT. Of note patient had surgery to left breast less than 24 hours ago. Discuss with patient option to remove vs leaving intact however advised patient to discuss with breast surgeon at 09/10/21 follow up appointment as to appropriate time to have removed. Patient verbalized understanding and states she would like it removed as soon as clearance is provided. She will call device clinic to advise. Patient is instructed to unplug remote monitor. Confirmed address and return kit sent. Future remote transmission appointments are cancelled, patient is marked inactive in paceart, and discontinued in carelink. ? ? ? ? ? ? ? ? ? ?

## 2021-09-01 ENCOUNTER — Encounter: Payer: Self-pay | Admitting: *Deleted

## 2021-09-02 ENCOUNTER — Encounter (HOSPITAL_COMMUNITY): Payer: Self-pay

## 2021-09-06 NOTE — Progress Notes (Signed)
?Radiation Oncology         [phone] ?________________________________ ? ?Name: Christy [name]        MRN: [id number]  ?Date of Service: 09/09/2021 DOB: 14-Apr-1941 ? ?MV:HQIONG, Denton Ar, MD  Truitt Merle, MD    ? ?REFERRING PHYSICIAN: Truitt Merle, MD ? ? ?DIAGNOSIS: The encounter diagnosis was Malignant neoplasm of upper-outer quadrant of left breast in female, estrogen receptor positive ([place]). ? ? ?HISTORY OF PRESENT ILLNESS: Christy [name] is a 81 y.o. female seen in the multidisciplinary breast clinic for a new diagnosis of left breast cancer. The patient was noted to have a screening detected mass in the left breast. Diagnostic imaging showed a 1.4 cm mass in the 12:00 position. Her left axilla was negative for adenopathy. She underwent a biopsy on 07/20/21 that showed grade 1-2 invasive ductal carcinoma that was ER/PR positive, HER2 was negative. Ki 67 was 5%.  ? ?Since her last visit she underwent lumpectomy on 08/12/2021 showing a 1.7 cm, grade 2 invasive and in situ ductal carcinoma.  Her in situ disease was 1 mm from the superior margin and invasive disease involved to the superior margin.  No lymph nodes were sampled.  She returned for reexcision of her superior margin on 08/25/2021 to the operating room, and final pathology showed no focal residual carcinoma.  She is seen today to discuss next steps and options of adjuvant therapy.  At her last visit she was interested in proceeding aggressively with adjuvant therapies. ? ?PREVIOUS RADIATION THERAPY: No ? ? ?PAST MEDICAL HISTORY:  ?Past Medical History:  ?Diagnosis Date  ? Anxiety   ? Atrial fibrillation ([place])   ? Bilateral lower extremity edema   ? Chronic, likely related to venous stasis   ? DJD (degenerative joint disease), lumbosacral   ? Dyslipidemia   ? Dysrhythmia   ? A fib  ? Essential hypertension   ? "dx'd recently" (10/06/2016)  ? GERD (gastroesophageal reflux disease)   ? History of hepatitis C virus infection ~ 1997  ? In remission  ? Lung granuloma  ([place]) 2004  ? CT scan  ? Mitral regurgitation   ? 04/30/21 echo: bileaflet prolapse with moderate MR  ? Osteopenia   ? Paroxysmal atrial tachycardia ([place])   ? PE (pulmonary embolism) 01/2016  ? PONV (postoperative nausea and vomiting)   ? Patient states she had extremem nausea after neck surgery several years ago  ? Spondylolisthesis   ? Status post epidural injections ?3 by neurosurgery  - Dr. Joya Salm   ? Varicose veins of both legs with edema   ? Vitamin D deficiency   ?   ? ? ?PAST SURGICAL HISTORY: ?Past Surgical History:  ?Procedure Laterality Date  ? ATRIAL FIBRILLATION ABLATION N/A 03/09/2017  ? Procedure: Atrial Fibrillation Ablation;  Surgeon: [name], MD;  Location: [facility];  Service: Cardiovascular;  Laterality: N/A;  ? BACK SURGERY    ? BREAST LUMPECTOMY WITH RADIOACTIVE SEED LOCALIZATION Left 08/12/2021  ? Procedure: LEFT BREAST LUMPECTOMY WITH RADIOACTIVE SEED LOCALIZATION;  Surgeon: Donnie Mesa, MD;  Location: [facility];  Service: General;  Laterality: Left;  ? CARDIOVERSION N/A 03/21/2016  ? Procedure: CARDIOVERSION;  Surgeon: [name], MD;  Location: [facility];  Service: Cardiovascular;  Laterality: N/A;  ? COMBINED HYSTEROSCOPY DIAGNOSTIC / D&C  2005  ? COMBINED HYSTEROSCOPY DIAGNOSTIC / D&C  1999  ? DILATION AND CURETTAGE OF UTERUS    ? LOOP RECORDER INSERTION N/A 10/31/2017  ? Procedure: LOOP RECORDER INSERTION;  Surgeon: [name], MD;  Location: [facility];  Service: Cardiovascular;  Laterality: N/A;  ? [place]  ? "went in front and back; broken neck"  ? RE-EXCISION OF BREAST CANCER,SUPERIOR MARGINS Left 08/25/2021  ? Procedure: RE-EXCISION OF SUPERIOR MARGIN -  LEFT BREAST LUMPECTOMY;  Surgeon: Donnie Mesa, MD;  Location: [facility];  Service: General;  Laterality: Left;  ? TEE WITHOUT CARDIOVERSION N/A 03/08/2017  ? Procedure: TRANSESOPHAGEAL ECHOCARDIOGRAM (TEE);  Surgeon: Josue Hector, MD;  Location: [facility];  Service: Cardiovascular;   Laterality: N/A;  ? TOE SURGERY Right   ? replaced joint  ? TONSILLECTOMY    ? TUBAL LIGATION Bilateral 1976  ? VAGINAL DELIVERY    ? x2  ? ? ? ?FAMILY HISTORY:  ?Family History  ?Problem Relation Age of Onset  ? Heart Problems Mother   ? CVA Mother   ? Heart attack Mother 97  ? CVA Father   ? Colon cancer Neg Hx   ? Clotting disorder Neg Hx   ? ? ? ?SOCIAL HISTORY:  reports that she has never smoked. She has never used smokeless tobacco. She reports current alcohol use of about 14.0 standard drinks per week. She reports that she does not use drugs.  The patient is married and lives in [place].  She is a retired Designer, jewellery. She enjoys spending time with friends, doing yoga, playing bridge, and travelling to the beach. ? ? ?ALLERGIES: Caffeine and Epinephrine ? ? ?MEDICATIONS:  ?Current Outpatient Medications  ?Medication Sig Dispense Refill  ? apixaban (ELIQUIS) 5 MG TABS tablet TAKE 1 TABLET BY MOUTH TWICE DAILY(START 02/03/2016) 180 tablet 3  ? Ascorbic Acid (VITAMIN C) 1000 MG tablet Take 1,000 mg by mouth in the morning.    ? betamethasone valerate (VALISONE) 0.1 % cream Apply 1 application topically 2 (two) times daily as needed (itching.).    ? cholecalciferol (VITAMIN D3) 25 MCG (1000 UNIT) tablet Take 1,000 Units by mouth in the morning.    ? dofetilide (TIKOSYN) 250 MCG capsule TAKE 1 CAPSULE BY MOUTH TWICE DAILY IN THE MORNING AND DINNER FOR HEART. Appointment Required For Further Refills [phone] 180 capsule 1  ? furosemide (LASIX) 20 MG tablet Take 1 tablet (20 mg total) by mouth daily as needed for fluid or edema (weight gain of 3 lbs in 24 hrs or 5 lbs in 1 week). 30 tablet 3  ? guaiFENesin (MUCINEX) 600 MG 12 hr tablet Take 600 mg by mouth in the morning.    ? loratadine (CLARITIN) 10 MG tablet Take 10 mg by mouth in the morning.    ? losartan (COZAAR) 25 MG tablet Take 25 mg by mouth in the morning.    ? Magnesium 250 MG TABS Take 250 mg by mouth at bedtime.    ? Multiple Vitamin  (MULTIVITAMIN WITH MINERALS) TABS tablet Take 1 tablet by mouth in the morning.    ? pantoprazole (PROTONIX) 40 MG tablet Take 40 mg by mouth 2 (two) times daily.    ? potassium chloride (KLOR-CON) 10 MEQ tablet Take 1 tablet (10 mEq total) by mouth daily. 30 tablet 6  ? propranolol (INDERAL) 20 MG tablet Take 1 tablet (20 mg total) by mouth 2 (two) times daily. 180 tablet 3  ? ?No current facility-administered medications for this visit.  ? ? ? ?REVIEW OF SYSTEMS: On review of systems, the patient reports that she is doing well overall since her second surgery without pain or concerns  about her healing. She is trying to decide how she would like to proceed with her adjuvant options, but is not planning to take antiestrogen therapy. No other complaints are noted.  ? ?  ? ?PHYSICAL EXAM:  ?Wt Readings from Last 3 Encounters:  ?08/25/21 156 lb (70.8 kg)  ?08/12/21 155 lb (70.3 kg)  ?08/10/21 157 lb 12.8 oz (71.6 kg)  ? ?Temp Readings from Last 3 Encounters:  ?08/25/21 98.5 ?F (36.9 ?C)  ?08/12/21 97.8 ?F (36.6 ?C)  ?08/10/21 97.7 ?F (36.5 ?C) (Oral)  ? ?BP Readings from Last 3 Encounters:  ?08/25/21 (!) 170/77  ?08/12/21 (!) 158/66  ?08/10/21 137/72  ? ?Pulse Readings from Last 3 Encounters:  ?08/25/21 (!) 50  ?08/12/21 (!) 51  ?08/10/21 (!) 59  ? ? ?In general this is a well appearing Caucasian female in no acute distress. She's alert and oriented x4 and appropriate throughout the examination. Cardiopulmonary assessment is negative for acute distress and she exhibits normal effort.  Her left breast incision is well-healed without separation drainage or erythema. There is some visible volume loss due to the surgeries she's had.  ? ? ? ?ECOG = 0 ? ?0 - Asymptomatic (Fully active, able to carry on all predisease activities without restriction) ? ?1 - Symptomatic but completely ambulatory (Restricted in physically strenuous activity but ambulatory and able to carry out work of a light or sedentary nature. For example,  light housework, office work) ? ?2 - Symptomatic, <50% in bed during the day (Ambulatory and capable of all self care but unable to carry out any work activities. Up and about more than 50% of waking hours) ?

## 2021-09-08 ENCOUNTER — Telehealth: Payer: Self-pay

## 2021-09-08 NOTE — Telephone Encounter (Signed)
Verified patient identity and reminded patient of their 9:00am-09/09/21 in-person appointment w/ Shona Simpson PA-C. I advised patient to arrive 28mn early for check-in. I left my extension [phone] case patient needs anything. ?

## 2021-09-09 ENCOUNTER — Ambulatory Visit
Admission: RE | Admit: 2021-09-09 | Discharge: 2021-09-09 | Disposition: A | Discharge status: Home / Self Care | Payer: PPO | Source: Ambulatory Visit | Attending: Radiation Oncology | Admitting: Radiation Oncology

## 2021-09-09 ENCOUNTER — Other Ambulatory Visit: Payer: Self-pay

## 2021-09-09 ENCOUNTER — Ambulatory Visit: Payer: PPO | Admitting: Radiation Oncology

## 2021-09-09 ENCOUNTER — Ambulatory Visit: Payer: PPO

## 2021-09-09 ENCOUNTER — Encounter: Payer: Self-pay | Admitting: Radiation Oncology

## 2021-09-09 VITALS — BP 134/61 | HR 61 | Temp 97.2°F | Resp 18 | Ht 67.5 in | Wt 160.0 lb

## 2021-09-09 DIAGNOSIS — I10 Essential (primary) hypertension: Secondary | ICD-10-CM | POA: Insufficient documentation

## 2021-09-09 DIAGNOSIS — K219 Gastro-esophageal reflux disease without esophagitis: Secondary | ICD-10-CM | POA: Insufficient documentation

## 2021-09-09 DIAGNOSIS — Z170 Estrogen receptor positive status [ER+]: Secondary | ICD-10-CM | POA: Insufficient documentation

## 2021-09-09 DIAGNOSIS — Z79899 Other long term (current) drug therapy: Secondary | ICD-10-CM | POA: Diagnosis not present

## 2021-09-09 DIAGNOSIS — M8580 Other specified disorders of bone density and structure, unspecified site: Secondary | ICD-10-CM | POA: Insufficient documentation

## 2021-09-09 DIAGNOSIS — C50412 Malignant neoplasm of upper-outer quadrant of left female breast: Secondary | ICD-10-CM | POA: Diagnosis not present

## 2021-09-09 DIAGNOSIS — I4891 Unspecified atrial fibrillation: Secondary | ICD-10-CM | POA: Diagnosis not present

## 2021-09-09 DIAGNOSIS — E785 Hyperlipidemia, unspecified: Secondary | ICD-10-CM | POA: Diagnosis not present

## 2021-09-09 DIAGNOSIS — Z86711 Personal history of pulmonary embolism: Secondary | ICD-10-CM | POA: Diagnosis not present

## 2021-09-09 DIAGNOSIS — E559 Vitamin D deficiency, unspecified: Secondary | ICD-10-CM | POA: Diagnosis not present

## 2021-09-09 DIAGNOSIS — M1990 Unspecified osteoarthritis, unspecified site: Secondary | ICD-10-CM | POA: Diagnosis not present

## 2021-09-09 DIAGNOSIS — Z7901 Long term (current) use of anticoagulants: Secondary | ICD-10-CM | POA: Diagnosis not present

## 2021-09-09 DIAGNOSIS — N390 Urinary tract infection, site not specified: Secondary | ICD-10-CM | POA: Diagnosis not present

## 2021-09-09 NOTE — Progress Notes (Signed)
Consult appointment. I verified patient identity, and began nursing interview w/ spouse Mr. Brizeyda Holtmeyer in attendance. Patient is doing well. No breast discomfort reported at this time. ? ?Meaningful use complete. ?Postmenopausal-NO chances of pregnancy ? ?BP 134/61 (BP Location: Left Arm, Patient Position: Sitting, Cuff Size: Normal)   Pulse 61   Temp (!) 97.2 ?F (36.2 ?C)   Resp 18   Ht 5' 7.5" (1.715 m)   Wt 160 lb (72.6 kg)   SpO2 99%   BMI 24.69 kg/m?  ? ?

## 2021-09-10 ENCOUNTER — Telehealth: Payer: Self-pay | Admitting: Internal Medicine

## 2021-09-10 NOTE — Telephone Encounter (Signed)
Patient states she had her breast surgery and has decided to do radiation treatment. She says the Radiation office would like clarification on whether she will be having her loop recorder removed and when. She says she had gotten a call stating her loop recorder was not working. She would like someone to contact the radiation office at the cancer center. Phone: [phone] ? ? ?

## 2021-09-10 NOTE — Telephone Encounter (Signed)
Spoke with cancer center radiology. ? ?Advised loop recorder is RRT-not functioning. ? ?Per radiology- no issue with leaving loop in place if it's not working. ? ?Outreach made to Pt.  She has decided to leave loop in place until after radiation.  At that point she will call and schedule an appointment to have it removed. ? ?

## 2021-09-16 ENCOUNTER — Encounter: Payer: Self-pay | Admitting: *Deleted

## 2021-09-17 ENCOUNTER — Telehealth: Payer: Self-pay | Admitting: Hematology

## 2021-09-17 NOTE — Telephone Encounter (Signed)
.  Called patient to schedule appointment per 3/30 inbasket, patient is aware of date and time.   ?

## 2021-09-19 DIAGNOSIS — Z510 Encounter for antineoplastic radiation therapy: Secondary | ICD-10-CM | POA: Insufficient documentation

## 2021-09-19 DIAGNOSIS — Z170 Estrogen receptor positive status [ER+]: Secondary | ICD-10-CM | POA: Insufficient documentation

## 2021-09-19 DIAGNOSIS — C50412 Malignant neoplasm of upper-outer quadrant of left female breast: Secondary | ICD-10-CM | POA: Insufficient documentation

## 2021-09-20 ENCOUNTER — Other Ambulatory Visit: Payer: Self-pay

## 2021-09-20 ENCOUNTER — Ambulatory Visit
Admission: RE | Admit: 2021-09-20 | Discharge: 2021-09-20 | Disposition: A | Discharge status: Home / Self Care | Payer: PPO | Source: Ambulatory Visit | Attending: Radiation Oncology | Admitting: Radiation Oncology

## 2021-09-20 DIAGNOSIS — Z170 Estrogen receptor positive status [ER+]: Secondary | ICD-10-CM | POA: Diagnosis not present

## 2021-09-20 DIAGNOSIS — C50412 Malignant neoplasm of upper-outer quadrant of left female breast: Secondary | ICD-10-CM | POA: Diagnosis not present

## 2021-09-20 DIAGNOSIS — Z510 Encounter for antineoplastic radiation therapy: Secondary | ICD-10-CM | POA: Diagnosis not present

## 2021-09-21 ENCOUNTER — Ambulatory Visit
Admission: RE | Admit: 2021-09-21 | Discharge: 2021-09-21 | Disposition: A | Discharge status: Home / Self Care | Payer: PPO | Source: Ambulatory Visit | Attending: Radiation Oncology | Admitting: Radiation Oncology

## 2021-09-21 DIAGNOSIS — C50412 Malignant neoplasm of upper-outer quadrant of left female breast: Secondary | ICD-10-CM | POA: Diagnosis not present

## 2021-09-21 DIAGNOSIS — Z510 Encounter for antineoplastic radiation therapy: Secondary | ICD-10-CM | POA: Diagnosis not present

## 2021-09-21 DIAGNOSIS — Z170 Estrogen receptor positive status [ER+]: Secondary | ICD-10-CM | POA: Diagnosis not present

## 2021-09-22 ENCOUNTER — Other Ambulatory Visit: Payer: Self-pay

## 2021-09-22 ENCOUNTER — Ambulatory Visit
Admission: RE | Admit: 2021-09-22 | Discharge: 2021-09-22 | Disposition: A | Discharge status: Home / Self Care | Payer: PPO | Source: Ambulatory Visit | Attending: Radiation Oncology | Admitting: Radiation Oncology

## 2021-09-22 DIAGNOSIS — C50412 Malignant neoplasm of upper-outer quadrant of left female breast: Secondary | ICD-10-CM | POA: Diagnosis not present

## 2021-09-22 DIAGNOSIS — Z510 Encounter for antineoplastic radiation therapy: Secondary | ICD-10-CM | POA: Diagnosis not present

## 2021-09-22 DIAGNOSIS — Z170 Estrogen receptor positive status [ER+]: Secondary | ICD-10-CM | POA: Diagnosis not present

## 2021-09-23 ENCOUNTER — Ambulatory Visit
Admission: RE | Admit: 2021-09-23 | Discharge: 2021-09-23 | Disposition: A | Discharge status: Home / Self Care | Payer: PPO | Source: Ambulatory Visit | Attending: Radiation Oncology | Admitting: Radiation Oncology

## 2021-09-23 DIAGNOSIS — Z510 Encounter for antineoplastic radiation therapy: Secondary | ICD-10-CM | POA: Diagnosis not present

## 2021-09-23 DIAGNOSIS — Z170 Estrogen receptor positive status [ER+]: Secondary | ICD-10-CM | POA: Diagnosis not present

## 2021-09-23 DIAGNOSIS — C50412 Malignant neoplasm of upper-outer quadrant of left female breast: Secondary | ICD-10-CM | POA: Diagnosis not present

## 2021-09-24 ENCOUNTER — Ambulatory Visit
Admission: RE | Admit: 2021-09-24 | Discharge: 2021-09-24 | Disposition: A | Discharge status: Home / Self Care | Payer: PPO | Source: Ambulatory Visit | Attending: Radiation Oncology | Admitting: Radiation Oncology

## 2021-09-24 ENCOUNTER — Other Ambulatory Visit: Payer: Self-pay

## 2021-09-24 DIAGNOSIS — Z170 Estrogen receptor positive status [ER+]: Secondary | ICD-10-CM

## 2021-09-24 DIAGNOSIS — Z510 Encounter for antineoplastic radiation therapy: Secondary | ICD-10-CM | POA: Diagnosis not present

## 2021-09-24 DIAGNOSIS — C50412 Malignant neoplasm of upper-outer quadrant of left female breast: Secondary | ICD-10-CM | POA: Diagnosis not present

## 2021-09-24 MED ORDER — RADIAPLEXRX EX GEL: Freq: Once | CUTANEOUS | Status: AC

## 2021-09-24 NOTE — Progress Notes (Signed)
Pt here for patient teaching.  Pt given Radiation and You booklet, skin care instructions, and Radiaplex.  Patient was advised to obtain over the counter aluminum free deodorant.  Reviewed areas of pertinence such as fatigue, hair loss, skin changes, breast tenderness, and breast swelling. Pt able to give teach back of to pat skin and use unscented/gentle soap,apply Radiaplex bid, avoid applying anything to skin within 4 hours of treatment, avoid wearing an under wire bra, and to use an electric razor if they must shave. Pt verbalizes understanding of information given and will contact nursing with any questions or concerns.   ? ?Gloriajean Dell. Leonie Green, BSN  ?

## 2021-09-27 ENCOUNTER — Ambulatory Visit
Admission: RE | Admit: 2021-09-27 | Discharge: 2021-09-27 | Disposition: A | Discharge status: Home / Self Care | Payer: PPO | Source: Ambulatory Visit | Attending: Radiation Oncology | Admitting: Radiation Oncology

## 2021-09-27 ENCOUNTER — Other Ambulatory Visit: Payer: Self-pay

## 2021-09-27 ENCOUNTER — Ambulatory Visit: Payer: PPO | Admitting: Hematology

## 2021-09-27 DIAGNOSIS — C50412 Malignant neoplasm of upper-outer quadrant of left female breast: Secondary | ICD-10-CM | POA: Diagnosis not present

## 2021-09-27 DIAGNOSIS — Z170 Estrogen receptor positive status [ER+]: Secondary | ICD-10-CM | POA: Diagnosis not present

## 2021-09-27 DIAGNOSIS — Z510 Encounter for antineoplastic radiation therapy: Secondary | ICD-10-CM | POA: Diagnosis not present

## 2021-09-28 ENCOUNTER — Ambulatory Visit
Admission: RE | Admit: 2021-09-28 | Discharge: 2021-09-28 | Disposition: A | Discharge status: Home / Self Care | Payer: PPO | Source: Ambulatory Visit | Attending: Radiation Oncology | Admitting: Radiation Oncology

## 2021-09-28 DIAGNOSIS — C50412 Malignant neoplasm of upper-outer quadrant of left female breast: Secondary | ICD-10-CM | POA: Diagnosis not present

## 2021-09-28 DIAGNOSIS — Z170 Estrogen receptor positive status [ER+]: Secondary | ICD-10-CM | POA: Diagnosis not present

## 2021-09-28 DIAGNOSIS — Z510 Encounter for antineoplastic radiation therapy: Secondary | ICD-10-CM | POA: Diagnosis not present

## 2021-09-29 ENCOUNTER — Other Ambulatory Visit: Payer: Self-pay

## 2021-09-29 ENCOUNTER — Ambulatory Visit
Admission: RE | Admit: 2021-09-29 | Discharge: 2021-09-29 | Disposition: A | Discharge status: Home / Self Care | Payer: PPO | Source: Ambulatory Visit | Attending: Radiation Oncology | Admitting: Radiation Oncology

## 2021-09-29 DIAGNOSIS — C50412 Malignant neoplasm of upper-outer quadrant of left female breast: Secondary | ICD-10-CM | POA: Diagnosis not present

## 2021-09-29 DIAGNOSIS — Z170 Estrogen receptor positive status [ER+]: Secondary | ICD-10-CM | POA: Diagnosis not present

## 2021-09-29 DIAGNOSIS — Z510 Encounter for antineoplastic radiation therapy: Secondary | ICD-10-CM | POA: Diagnosis not present

## 2021-09-30 ENCOUNTER — Ambulatory Visit
Admission: RE | Admit: 2021-09-30 | Discharge: 2021-09-30 | Disposition: A | Discharge status: Home / Self Care | Payer: PPO | Source: Ambulatory Visit | Attending: Radiation Oncology | Admitting: Radiation Oncology

## 2021-09-30 DIAGNOSIS — C50412 Malignant neoplasm of upper-outer quadrant of left female breast: Secondary | ICD-10-CM | POA: Diagnosis not present

## 2021-09-30 DIAGNOSIS — Z510 Encounter for antineoplastic radiation therapy: Secondary | ICD-10-CM | POA: Diagnosis not present

## 2021-09-30 DIAGNOSIS — Z170 Estrogen receptor positive status [ER+]: Secondary | ICD-10-CM | POA: Diagnosis not present

## 2021-10-01 ENCOUNTER — Ambulatory Visit
Admission: RE | Admit: 2021-10-01 | Discharge: 2021-10-01 | Disposition: A | Discharge status: Home / Self Care | Payer: PPO | Source: Ambulatory Visit | Attending: Radiation Oncology | Admitting: Radiation Oncology

## 2021-10-01 ENCOUNTER — Other Ambulatory Visit: Payer: Self-pay

## 2021-10-01 DIAGNOSIS — Z170 Estrogen receptor positive status [ER+]: Secondary | ICD-10-CM | POA: Diagnosis not present

## 2021-10-01 DIAGNOSIS — C50412 Malignant neoplasm of upper-outer quadrant of left female breast: Secondary | ICD-10-CM | POA: Diagnosis not present

## 2021-10-01 DIAGNOSIS — Z510 Encounter for antineoplastic radiation therapy: Secondary | ICD-10-CM | POA: Diagnosis not present

## 2021-10-01 DIAGNOSIS — J069 Acute upper respiratory infection, unspecified: Secondary | ICD-10-CM | POA: Diagnosis not present

## 2021-10-01 DIAGNOSIS — R051 Acute cough: Secondary | ICD-10-CM | POA: Diagnosis not present

## 2021-10-04 ENCOUNTER — Other Ambulatory Visit: Payer: Self-pay

## 2021-10-04 ENCOUNTER — Ambulatory Visit
Admission: RE | Admit: 2021-10-04 | Discharge: 2021-10-04 | Disposition: A | Discharge status: Home / Self Care | Payer: PPO | Source: Ambulatory Visit | Attending: Radiation Oncology | Admitting: Radiation Oncology

## 2021-10-04 DIAGNOSIS — Z170 Estrogen receptor positive status [ER+]: Secondary | ICD-10-CM | POA: Diagnosis not present

## 2021-10-04 DIAGNOSIS — C50919 Malignant neoplasm of unspecified site of unspecified female breast: Secondary | ICD-10-CM | POA: Diagnosis not present

## 2021-10-04 DIAGNOSIS — B1920 Unspecified viral hepatitis C without hepatic coma: Secondary | ICD-10-CM | POA: Diagnosis not present

## 2021-10-04 DIAGNOSIS — E559 Vitamin D deficiency, unspecified: Secondary | ICD-10-CM | POA: Diagnosis not present

## 2021-10-04 DIAGNOSIS — I4891 Unspecified atrial fibrillation: Secondary | ICD-10-CM | POA: Diagnosis not present

## 2021-10-04 DIAGNOSIS — N182 Chronic kidney disease, stage 2 (mild): Secondary | ICD-10-CM | POA: Diagnosis not present

## 2021-10-04 DIAGNOSIS — C50412 Malignant neoplasm of upper-outer quadrant of left female breast: Secondary | ICD-10-CM | POA: Diagnosis not present

## 2021-10-04 DIAGNOSIS — Z510 Encounter for antineoplastic radiation therapy: Secondary | ICD-10-CM | POA: Diagnosis not present

## 2021-10-04 DIAGNOSIS — Z86711 Personal history of pulmonary embolism: Secondary | ICD-10-CM | POA: Diagnosis not present

## 2021-10-04 DIAGNOSIS — Z0000 Encounter for general adult medical examination without abnormal findings: Secondary | ICD-10-CM | POA: Diagnosis not present

## 2021-10-04 DIAGNOSIS — I10 Essential (primary) hypertension: Secondary | ICD-10-CM | POA: Diagnosis not present

## 2021-10-04 DIAGNOSIS — E782 Mixed hyperlipidemia: Secondary | ICD-10-CM | POA: Diagnosis not present

## 2021-10-04 DIAGNOSIS — M8580 Other specified disorders of bone density and structure, unspecified site: Secondary | ICD-10-CM | POA: Diagnosis not present

## 2021-10-04 DIAGNOSIS — I5030 Unspecified diastolic (congestive) heart failure: Secondary | ICD-10-CM | POA: Diagnosis not present

## 2021-10-04 DIAGNOSIS — D6869 Other thrombophilia: Secondary | ICD-10-CM | POA: Diagnosis not present

## 2021-10-05 ENCOUNTER — Ambulatory Visit
Admission: RE | Admit: 2021-10-05 | Discharge: 2021-10-05 | Disposition: A | Discharge status: Home / Self Care | Payer: PPO | Source: Ambulatory Visit | Attending: Radiation Oncology | Admitting: Radiation Oncology

## 2021-10-05 ENCOUNTER — Other Ambulatory Visit: Payer: Self-pay

## 2021-10-05 DIAGNOSIS — Z510 Encounter for antineoplastic radiation therapy: Secondary | ICD-10-CM | POA: Diagnosis not present

## 2021-10-05 DIAGNOSIS — C50412 Malignant neoplasm of upper-outer quadrant of left female breast: Secondary | ICD-10-CM | POA: Diagnosis not present

## 2021-10-05 DIAGNOSIS — Z170 Estrogen receptor positive status [ER+]: Secondary | ICD-10-CM | POA: Diagnosis not present

## 2021-10-05 LAB — RAD ONC ARIA SESSION SUMMARY
Course Elapsed Days: 15
Plan Fractions Treated to Date: 12
Plan Prescribed Dose Per Fraction: 2.66 Gy
Plan Total Fractions Prescribed: 16
Plan Total Prescribed Dose: 42.56 Gy
Reference Point Dosage Given to Date: 31.92 Gy
Reference Point Session Dosage Given: 2.66 Gy
Session Number: 12

## 2021-10-06 ENCOUNTER — Ambulatory Visit
Admission: RE | Admit: 2021-10-06 | Discharge: 2021-10-06 | Disposition: A | Discharge status: Home / Self Care | Payer: PPO | Source: Ambulatory Visit | Attending: Radiation Oncology | Admitting: Radiation Oncology

## 2021-10-06 ENCOUNTER — Other Ambulatory Visit: Payer: Self-pay

## 2021-10-06 DIAGNOSIS — C50412 Malignant neoplasm of upper-outer quadrant of left female breast: Secondary | ICD-10-CM | POA: Diagnosis not present

## 2021-10-06 DIAGNOSIS — Z510 Encounter for antineoplastic radiation therapy: Secondary | ICD-10-CM | POA: Diagnosis not present

## 2021-10-06 DIAGNOSIS — Z170 Estrogen receptor positive status [ER+]: Secondary | ICD-10-CM | POA: Diagnosis not present

## 2021-10-06 LAB — RAD ONC ARIA SESSION SUMMARY
Course Elapsed Days: 16
Plan Fractions Treated to Date: 13
Plan Prescribed Dose Per Fraction: 2.66 Gy
Plan Total Fractions Prescribed: 16
Plan Total Prescribed Dose: 42.56 Gy
Reference Point Dosage Given to Date: 34.58 Gy
Reference Point Session Dosage Given: 2.66 Gy
Session Number: 13

## 2021-10-07 ENCOUNTER — Ambulatory Visit
Admission: RE | Admit: 2021-10-07 | Discharge: 2021-10-07 | Disposition: A | Discharge status: Home / Self Care | Payer: PPO | Source: Ambulatory Visit | Attending: Radiation Oncology | Admitting: Radiation Oncology

## 2021-10-07 ENCOUNTER — Other Ambulatory Visit: Payer: Self-pay

## 2021-10-07 DIAGNOSIS — C50412 Malignant neoplasm of upper-outer quadrant of left female breast: Secondary | ICD-10-CM | POA: Diagnosis not present

## 2021-10-07 DIAGNOSIS — Z510 Encounter for antineoplastic radiation therapy: Secondary | ICD-10-CM | POA: Diagnosis not present

## 2021-10-07 DIAGNOSIS — Z170 Estrogen receptor positive status [ER+]: Secondary | ICD-10-CM | POA: Diagnosis not present

## 2021-10-07 LAB — RAD ONC ARIA SESSION SUMMARY
Course Elapsed Days: 17
Plan Fractions Treated to Date: 14
Plan Prescribed Dose Per Fraction: 2.66 Gy
Plan Total Fractions Prescribed: 16
Plan Total Prescribed Dose: 42.56 Gy
Reference Point Dosage Given to Date: 37.24 Gy
Reference Point Session Dosage Given: 2.66 Gy
Session Number: 14

## 2021-10-08 ENCOUNTER — Other Ambulatory Visit: Payer: Self-pay

## 2021-10-08 ENCOUNTER — Ambulatory Visit
Admission: RE | Admit: 2021-10-08 | Discharge: 2021-10-08 | Disposition: A | Discharge status: Home / Self Care | Payer: PPO | Source: Ambulatory Visit | Attending: Radiation Oncology | Admitting: Radiation Oncology

## 2021-10-08 DIAGNOSIS — C50412 Malignant neoplasm of upper-outer quadrant of left female breast: Secondary | ICD-10-CM | POA: Diagnosis not present

## 2021-10-08 DIAGNOSIS — Z170 Estrogen receptor positive status [ER+]: Secondary | ICD-10-CM | POA: Diagnosis not present

## 2021-10-08 DIAGNOSIS — Z510 Encounter for antineoplastic radiation therapy: Secondary | ICD-10-CM | POA: Diagnosis not present

## 2021-10-08 LAB — RAD ONC ARIA SESSION SUMMARY
Course Elapsed Days: 18
Plan Fractions Treated to Date: 15
Plan Prescribed Dose Per Fraction: 2.66 Gy
Plan Total Fractions Prescribed: 16
Plan Total Prescribed Dose: 42.56 Gy
Reference Point Dosage Given to Date: 39.9 Gy
Reference Point Session Dosage Given: 2.66 Gy
Session Number: 15

## 2021-10-11 ENCOUNTER — Other Ambulatory Visit: Payer: Self-pay

## 2021-10-11 ENCOUNTER — Ambulatory Visit
Admission: RE | Admit: 2021-10-11 | Discharge: 2021-10-11 | Disposition: A | Discharge status: Home / Self Care | Payer: PPO | Source: Ambulatory Visit | Attending: Radiation Oncology | Admitting: Radiation Oncology

## 2021-10-11 DIAGNOSIS — Z510 Encounter for antineoplastic radiation therapy: Secondary | ICD-10-CM | POA: Diagnosis not present

## 2021-10-11 DIAGNOSIS — Z170 Estrogen receptor positive status [ER+]: Secondary | ICD-10-CM | POA: Diagnosis not present

## 2021-10-11 DIAGNOSIS — C50412 Malignant neoplasm of upper-outer quadrant of left female breast: Secondary | ICD-10-CM | POA: Diagnosis not present

## 2021-10-11 LAB — RAD ONC ARIA SESSION SUMMARY
Course Elapsed Days: 21
Plan Fractions Treated to Date: 16
Plan Prescribed Dose Per Fraction: 2.66 Gy
Plan Total Fractions Prescribed: 16
Plan Total Prescribed Dose: 42.56 Gy
Reference Point Dosage Given to Date: 42.56 Gy
Reference Point Session Dosage Given: 2.66 Gy
Session Number: 16

## 2021-10-12 ENCOUNTER — Ambulatory Visit
Admission: RE | Admit: 2021-10-12 | Discharge: 2021-10-12 | Disposition: A | Discharge status: Home / Self Care | Payer: PPO | Source: Ambulatory Visit | Attending: Radiation Oncology | Admitting: Radiation Oncology

## 2021-10-12 ENCOUNTER — Other Ambulatory Visit: Payer: Self-pay

## 2021-10-12 DIAGNOSIS — C50412 Malignant neoplasm of upper-outer quadrant of left female breast: Secondary | ICD-10-CM | POA: Diagnosis not present

## 2021-10-12 DIAGNOSIS — Z510 Encounter for antineoplastic radiation therapy: Secondary | ICD-10-CM | POA: Diagnosis not present

## 2021-10-12 LAB — RAD ONC ARIA SESSION SUMMARY
Course Elapsed Days: 22
Plan Fractions Treated to Date: 1
Plan Prescribed Dose Per Fraction: 2 Gy
Plan Total Fractions Prescribed: 4
Plan Total Prescribed Dose: 8 Gy
Reference Point Dosage Given to Date: 44.56 Gy
Reference Point Session Dosage Given: 2 Gy
Session Number: 17

## 2021-10-13 ENCOUNTER — Encounter (HOSPITAL_COMMUNITY): Payer: Self-pay | Admitting: Nurse Practitioner

## 2021-10-13 ENCOUNTER — Other Ambulatory Visit: Payer: Self-pay

## 2021-10-13 ENCOUNTER — Ambulatory Visit (HOSPITAL_COMMUNITY)
Admission: RE | Admit: 2021-10-13 | Discharge: 2021-10-13 | Disposition: A | Discharge status: Home / Self Care | Payer: PPO | Source: Ambulatory Visit | Attending: Nurse Practitioner | Admitting: Nurse Practitioner

## 2021-10-13 ENCOUNTER — Ambulatory Visit
Admission: RE | Admit: 2021-10-13 | Discharge: 2021-10-13 | Disposition: A | Discharge status: Home / Self Care | Payer: PPO | Source: Ambulatory Visit | Attending: Radiation Oncology | Admitting: Radiation Oncology

## 2021-10-13 VITALS — BP 124/66 | HR 67 | Ht 67.5 in | Wt 158.8 lb

## 2021-10-13 DIAGNOSIS — I480 Paroxysmal atrial fibrillation: Secondary | ICD-10-CM | POA: Diagnosis not present

## 2021-10-13 DIAGNOSIS — I10 Essential (primary) hypertension: Secondary | ICD-10-CM | POA: Insufficient documentation

## 2021-10-13 DIAGNOSIS — Z8616 Personal history of COVID-19: Secondary | ICD-10-CM | POA: Diagnosis not present

## 2021-10-13 DIAGNOSIS — I82409 Acute embolism and thrombosis of unspecified deep veins of unspecified lower extremity: Secondary | ICD-10-CM | POA: Insufficient documentation

## 2021-10-13 DIAGNOSIS — Z7901 Long term (current) use of anticoagulants: Secondary | ICD-10-CM | POA: Insufficient documentation

## 2021-10-13 DIAGNOSIS — Z510 Encounter for antineoplastic radiation therapy: Secondary | ICD-10-CM | POA: Diagnosis not present

## 2021-10-13 DIAGNOSIS — D6869 Other thrombophilia: Secondary | ICD-10-CM

## 2021-10-13 DIAGNOSIS — C50412 Malignant neoplasm of upper-outer quadrant of left female breast: Secondary | ICD-10-CM | POA: Diagnosis not present

## 2021-10-13 LAB — RAD ONC ARIA SESSION SUMMARY
Course Elapsed Days: 23
Plan Fractions Treated to Date: 2
Plan Prescribed Dose Per Fraction: 2 Gy
Plan Total Fractions Prescribed: 4
Plan Total Prescribed Dose: 8 Gy
Reference Point Dosage Given to Date: 46.56 Gy
Reference Point Session Dosage Given: 2 Gy
Session Number: 18

## 2021-10-13 LAB — MAGNESIUM: Magnesium: 2 mg/dL (ref 1.7–2.4)

## 2021-10-13 NOTE — Progress Notes (Signed)
? ?Primary Care Physician: Wenda Low, MD ?Referring Physician: Dr. Meda Coffee ? ? ?Christy [name] is a 81 y.o. female with a h/o atrial fib and PE/DVT 01/2016. She is has been treated with Rythmol and Tikosyn in the past. When her burden increased on Tikosyn, it was decided to pursue ablation 02/26/17.  ? ?She is in the afib clinic today, 04/10/19, for f/u of Tikosyn past ablation. She feels good. She has been staying in [place]. She has a linq, has intermittent palpitations but per Dr. Jackalyn Lombard note disorganized atrial activity, but not afib. She is leaving for [place] in Feb/March,2021 for a couple of months. Continues on eliquis 5 mg bid for a CHA2DS2VASc score of 5, no bleeding issues. ? ?F/u in afib clinic, 07/18/19. She reports that she had Covid 19 the earlier part of the month while she and her husband were at the beach. They believe the exposure was from  2 furniture  delivery men  that were in their house for awhile, as they did not come into contact with other people and did not eat in restaurants.  She  and her husband both came down with it and received the antibody infusion at [facility] hospital. She overall felt better after that and she feels their symptoms were fairly mild. She did not have any afib while she was sick with covid. Continues on tikosyn and eliquis with a CHA2DS2VASc score of 5. ? ?F/u in afib clinic, 08/27/19, pt called to office saying that she is seeing HR's by BP cuff in the 40's at home even though she feels well. We ran a Link report that shows PAC's as well as episodes of afib that usually  last around mins to one hour. Pt does not feel these episodes. Her longest afib episode was 50 mins on 2/26 and 2/27 with v rates 120-150, again pt did not feel this. EKG today shows SR with pac's. She feels well today. ? ?F/u in afib clinic, 08/27/20. Afib has been quiet recently. She saw Dr. Rayann Heman 06/01/21 and by his assessment then, he did not feel she was a repeat ablation candidate. She continues on  tikosyn. No bleeding issues with eliquis with a CHA2DS2VASc score of 5.  ? ?F/u in afib clinic, 03/03/21. She remains on Tikosyn. One episode of afib that lasted around 24 hours and resolved on its own. She has an apple watch now so we discussed how she can track this as well as with her Linq. Last Linq report in September did not show any arrhythmia. Continues on eliquis 5 mg bid for a CHA2DS2VASc  score of 5. ? ?F/u in afib clinic, 10/13/21  for Tikosyn surveillance. Qt is stable. Only one episode of Afib lasting less than 8 hours. Remain complaint on Tikosyn.  She was dx with  early L breast CA in February, treated with lumpectomy and  will be finishing radiation soon. Has had a congested sough x 2 weeks. PCP has evaluated.  ? ?Today, she denies symptoms of palpitations, chest pain, shortness of breath, orthopnea, PND, lower extremity edema, dizziness, presyncope, syncope, or neurologic sequela. The patient is tolerating medications without difficulties and is otherwise without complaint today.  ? ?Past Medical History:  ?Diagnosis Date  ? Anxiety   ? Atrial fibrillation ([place])   ? Bilateral lower extremity edema   ? Chronic, likely related to venous stasis   ? DJD (degenerative joint disease), lumbosacral   ? Dyslipidemia   ? Dysrhythmia   ? A fib  ?  Essential hypertension   ? "dx'd recently" (10/06/2016)  ? GERD (gastroesophageal reflux disease)   ? History of hepatitis C virus infection ~ 1997  ? In remission  ? Lung granuloma ([place]) 2004  ? CT scan  ? Mitral regurgitation   ? 04/30/21 echo: bileaflet prolapse with moderate MR  ? Osteopenia   ? Paroxysmal atrial tachycardia ([place])   ? PE (pulmonary embolism) 01/2016  ? PONV (postoperative nausea and vomiting)   ? Patient states she had extremem nausea after neck surgery several years ago  ? Spondylolisthesis   ? Status post epidural injections ?3 by neurosurgery  - Dr. Joya Salm   ? Varicose veins of both legs with edema   ? Vitamin D deficiency   ? ?Past Surgical  History:  ?Procedure Laterality Date  ? ATRIAL FIBRILLATION ABLATION N/A 03/09/2017  ? Procedure: Atrial Fibrillation Ablation;  Surgeon: [name], MD;  Location: [place] [facility];  Service: Cardiovascular;  Laterality: N/A;  ? BACK SURGERY    ? BREAST LUMPECTOMY WITH RADIOACTIVE SEED LOCALIZATION Left 08/12/2021  ? Procedure: LEFT BREAST LUMPECTOMY WITH RADIOACTIVE SEED LOCALIZATION;  Surgeon: Donnie Mesa, MD;  Location: [place];  Service: General;  Laterality: Left;  ? CARDIOVERSION N/A 03/21/2016  ? Procedure: CARDIOVERSION;  Surgeon: [name], MD;  Location: [place];  Service: Cardiovascular;  Laterality: N/A;  ? COMBINED HYSTEROSCOPY DIAGNOSTIC / D&C  2005  ? COMBINED HYSTEROSCOPY DIAGNOSTIC / D&C  1999  ? DILATION AND CURETTAGE OF UTERUS    ? LOOP RECORDER INSERTION N/A 10/31/2017  ? Procedure: LOOP RECORDER INSERTION;  Surgeon: [name], MD;  Location: [place] [facility];  Service: Cardiovascular;  Laterality: N/A;  ? [place]  ? "went in front and back; broken neck"  ? RE-EXCISION OF BREAST CANCER,SUPERIOR MARGINS Left 08/25/2021  ? Procedure: RE-EXCISION OF SUPERIOR MARGIN -  LEFT BREAST LUMPECTOMY;  Surgeon: Donnie Mesa, MD;  Location: [place];  Service: General;  Laterality: Left;  ? TEE WITHOUT CARDIOVERSION N/A 03/08/2017  ? Procedure: TRANSESOPHAGEAL ECHOCARDIOGRAM (TEE);  Surgeon: Josue Hector, MD;  Location: [facility];  Service: Cardiovascular;  Laterality: N/A;  ? TOE SURGERY Right   ? replaced joint  ? TONSILLECTOMY    ? TUBAL LIGATION Bilateral 1976  ? VAGINAL DELIVERY    ? x2  ? ? ?Current Outpatient Medications  ?Medication Sig Dispense Refill  ? apixaban (ELIQUIS) 5 MG TABS tablet TAKE 1 TABLET BY MOUTH TWICE DAILY(START 02/03/2016) 180 tablet 3  ? Ascorbic Acid (VITAMIN C) 1000 MG tablet Take 1,000 mg by mouth in the morning.    ? betamethasone valerate (VALISONE) 0.1 % cream Apply 1 application topically 2 (two) times daily as needed (itching.).    ?  Cephalexin 500 MG tablet Take 500 mg by mouth 2 (two) times daily. Takes as needed for UTI's    ? cholecalciferol (VITAMIN D3) 25 MCG (1000 UNIT) tablet Take 1,000 Units by mouth in the morning.    ? dofetilide (TIKOSYN) 250 MCG capsule TAKE 1 CAPSULE BY MOUTH TWICE DAILY IN THE MORNING AND DINNER FOR HEART. Appointment Required For Further Refills [phone] 180 capsule 1  ? furosemide (LASIX) 20 MG tablet Take 1 tablet (20 mg total) by mouth daily as needed for fluid or edema (weight gain of 3 lbs in 24 hrs or 5 lbs in 1 week). 30 tablet 3  ? guaiFENesin (MUCINEX) 600 MG 12 hr tablet Take 600 mg by mouth in the morning.    ?  loratadine (CLARITIN) 10 MG tablet Take 10 mg by mouth in the morning.    ? losartan (COZAAR) 25 MG tablet Take 25 mg by mouth in the morning.    ? Magnesium 250 MG TABS Take 250 mg by mouth at bedtime.    ? Multiple Vitamin (MULTIVITAMIN WITH MINERALS) TABS tablet Take 1 tablet by mouth in the morning.    ? pantoprazole (PROTONIX) 40 MG tablet Take 40 mg by mouth 2 (two) times daily.    ? phenazopyridine (PYRIDIUM) 95 MG tablet Take 95 mg by mouth 3 (three) times daily as needed for pain.    ? potassium chloride (KLOR-CON) 10 MEQ tablet Take 1 tablet (10 mEq total) by mouth daily. 30 tablet 6  ? Probiotic Product (PROBIOTIC DAILY PO) Take 1 tablet by mouth every morning. May take 2 tablets as needed    ? propranolol (INDERAL) 20 MG tablet Take 1 tablet (20 mg total) by mouth 2 (two) times daily. 180 tablet 3  ? ?No current facility-administered medications for this encounter.  ? ? ?Allergies  ?Allergen Reactions  ? Caffeine   ?  Heart palpitations  ? Epinephrine Palpitations  ? ? ?Social History  ? ?Socioeconomic History  ? Marital status: Married  ?  Spouse name: Not on file  ? Number of children: 2  ? Years of education: Not on file  ? Highest education level: Not on file  ?Occupational History  ? Not on file  ?Tobacco Use  ? Smoking status: Never  ? Smokeless tobacco: Never  ?Vaping Use   ? Vaping Use: Never used  ?Substance and Sexual Activity  ? Alcohol use: Yes  ?  Alcohol/week: 14.0 standard drinks  ?  Types: 14 Glasses of wine per week  ?  Comment: occasional  ? Drug use: No  ? Sexual [place]

## 2021-10-14 ENCOUNTER — Other Ambulatory Visit: Payer: Self-pay

## 2021-10-14 ENCOUNTER — Ambulatory Visit
Admission: RE | Admit: 2021-10-14 | Discharge: 2021-10-14 | Disposition: A | Discharge status: Home / Self Care | Payer: PPO | Source: Ambulatory Visit | Attending: Radiation Oncology | Admitting: Radiation Oncology

## 2021-10-14 DIAGNOSIS — C50412 Malignant neoplasm of upper-outer quadrant of left female breast: Secondary | ICD-10-CM | POA: Diagnosis not present

## 2021-10-14 DIAGNOSIS — Z510 Encounter for antineoplastic radiation therapy: Secondary | ICD-10-CM | POA: Diagnosis not present

## 2021-10-14 LAB — RAD ONC ARIA SESSION SUMMARY
Course Elapsed Days: 24
Plan Fractions Treated to Date: 3
Plan Prescribed Dose Per Fraction: 2 Gy
Plan Total Fractions Prescribed: 4
Plan Total Prescribed Dose: 8 Gy
Reference Point Dosage Given to Date: 48.56 Gy
Reference Point Session Dosage Given: 2 Gy
Session Number: 19

## 2021-10-15 ENCOUNTER — Encounter: Payer: Self-pay | Admitting: Hematology

## 2021-10-15 ENCOUNTER — Inpatient Hospital Stay: Payer: PPO | Admitting: Hematology

## 2021-10-15 ENCOUNTER — Encounter: Payer: Self-pay | Admitting: Radiation Oncology

## 2021-10-15 ENCOUNTER — Ambulatory Visit
Admission: RE | Admit: 2021-10-15 | Discharge: 2021-10-15 | Disposition: A | Discharge status: Home / Self Care | Payer: PPO | Source: Ambulatory Visit | Attending: Radiation Oncology | Admitting: Radiation Oncology

## 2021-10-15 ENCOUNTER — Other Ambulatory Visit: Payer: Self-pay

## 2021-10-15 VITALS — BP 133/71 | HR 68 | Temp 97.8°F | Resp 16 | Wt 158.5 lb

## 2021-10-15 DIAGNOSIS — Z510 Encounter for antineoplastic radiation therapy: Secondary | ICD-10-CM | POA: Diagnosis not present

## 2021-10-15 DIAGNOSIS — C50412 Malignant neoplasm of upper-outer quadrant of left female breast: Secondary | ICD-10-CM | POA: Insufficient documentation

## 2021-10-15 DIAGNOSIS — Z170 Estrogen receptor positive status [ER+]: Secondary | ICD-10-CM | POA: Insufficient documentation

## 2021-10-15 DIAGNOSIS — Z7901 Long term (current) use of anticoagulants: Secondary | ICD-10-CM | POA: Insufficient documentation

## 2021-10-15 DIAGNOSIS — Z79899 Other long term (current) drug therapy: Secondary | ICD-10-CM | POA: Insufficient documentation

## 2021-10-15 LAB — RAD ONC ARIA SESSION SUMMARY
Course Elapsed Days: 25
Plan Fractions Treated to Date: 4
Plan Prescribed Dose Per Fraction: 2 Gy
Plan Total Fractions Prescribed: 4
Plan Total Prescribed Dose: 8 Gy
Reference Point Dosage Given to Date: 50.56 Gy
Reference Point Session Dosage Given: 2 Gy
Session Number: 20

## 2021-10-15 NOTE — Progress Notes (Signed)
?[place]   ?Telephone:(336) [phone] [phone]   ?Clinic Follow up Note  ? ?Patient Care Team: ?Wenda Low, MD as PCP - General (Internal Medicine) ?Rockwell Germany, RN as Oncology Nurse Navigator ?Mauro Kaufmann, RN as Oncology Nurse Navigator ?Donnie Mesa, MD as Consulting Physician (General Surgery) ?Truitt Merle, MD as Consulting Physician (Hematology) ?Kyung Rudd, MD as Consulting Physician (Radiation Oncology) ? ?Date of Service:  10/15/2021 ? ?CHIEF COMPLAINT: f/u of left breast cancer ? ?CURRENT THERAPY:  ?Surveillance ? ?ASSESSMENT & PLAN:  ?CHRISTAL LAGERSTROM is a 81 y.o. post-menopausal female with  ? ?1. Malignant neoplasm of upper-outer quadrant of left breast, Stage IA, pT1c, cN0, DCIS(+), ER+/PR+/HER2-, Grade 1-2  ?-found on screening mammogram. Left lumpectomy on 08/12/21 showed 1.7 cm invasive and in situ ductal carcinoma. Superior margin positive, and re-excision on 08/25/21 was negative for residual cancer. ?-she received daily radiation under Dr. Lisbeth Renshaw 4/3-4/28/23. ?-we reviewed adjuvant endocrine therapy with aromatase inhibitor or tamoxifen. Given her age and comorbidities, she is not very interested in antiestrogen therapy. I think it's acceptable not to pursue. ?-Dr. Georgette Dover plans to see her back in 02/2022, so I will see her back in a year. ?-we discussed symptoms and side effects of metastatic disease, including bone pain to big bones and abdominal pain or bloating. ?-We also discussed the breast cancer surveillance after her surgery. She will continue annual screening mammogram, self exam, and a routine office visit with lab and exam with [place]. She is interested in following with [place]. ?-I encouraged her to have healthy diet and exercise regularly.  ?  ?  ?PLAN:  ?-f/u with Dr. Georgette Dover in 02/2022 ?-lab and f/u in 1 year ? ? ?No problem-specific Assessment & Plan notes found for this encounter. ? ? ?SUMMARY OF ONCOLOGIC HISTORY: ?Oncology History Overview Note  ? Cancer  Staging  ?Malignant neoplasm of upper-outer quadrant of left breast in female, estrogen receptor positive ([place]) ?Staging form: Breast, AJCC 8th Edition ?- Clinical stage from 07/20/2021: Stage IA (cT1c, cN0, cM0, G1, ER+, PR+, HER2-) - Signed by Truitt Merle, MD on 07/27/2021 ? ?  ?Malignant neoplasm of upper-outer quadrant of left breast in female, estrogen receptor positive ([place])  ?07/15/2021 Mammogram  ? Exam: Breast Ultrasound - L ? ?There is an irregular mass with a spiculated margin in the left breast at 12 o'clock posterior depth 8 cm from the nipple. It measures 1.4 x 1.1 x 0.9 cm. This irregular mass is hypoechoic. This correlated with mammography findings. ? ?Ultrasound of the left axilla is negative for lymphadenopathy. Multiple normal lymph nodes are seen. ? ?IMPRESSION: ?The irregular mass in the left breast is highly suggestive of malignancy. ?  ?07/20/2021 Cancer Staging  ? Staging form: Breast, AJCC 8th Edition ?- Clinical stage from 07/20/2021: Stage IA (cT1c, cN0, cM0, G1, ER+, PR+, HER2-) - Signed by Truitt Merle, MD on 07/27/2021 ?Stage prefix: Initial diagnosis ?Histologic grading system: 3 grade system ? ?  ?07/20/2021 Initial Biopsy  ? Diagnosis ?Breast, left, needle core biopsy, left breast mass 12:00 8 cmfn ?- INVASIVE DUCTAL CARCINOMA ?- DUCTAL CARCINOMA IN SITU ?- SEE COMMENT ?Microscopic Comment ?Based on the biopsy, the carcinoma appears Nottingham grade 1-2 of 3 and measures 0.5 cm in greatest linear extent. ? ?PROGNOSTIC INDICATORS ?Results: ?Estrogen Receptor: 100%, POSITIVE, STRONG STAINING INTENSITY ?Progesterone Receptor: 95%, POSITIVE, STRONG STAINING INTENSITY ?Proliferation Marker Ki67: 5% ? ?FLUORESCENCE IN-SITU HYBRIDIZATION ?Results: ?GROUP 5: HER2 **NEGATIVE** ?  ?07/26/2021 Initial Diagnosis  ? Malignant  neoplasm of upper-outer quadrant of left breast in female, estrogen receptor positive ([place]) ? ?  ? Genetic Testing  ? [facility] CancerNext-Expanded Panel was Negative. Report date is  08/08/2021. ? ?The CancerNext-Expanded gene panel offered by [facility] and includes sequencing, rearrangement, and RNA analysis for the following 77 genes: AIP, ALK, APC, ATM, AXIN2, BAP1, BARD1, BLM, BMPR1A, BRCA1, BRCA2, BRIP1, CDC73, CDH1, CDK4, CDKN1B, CDKN2A, CHEK2, CTNNA1, DICER1, FANCC, FH, FLCN, GALNT12, KIF1B, LZTR1, MAX, MEN1, MET, MLH1, MSH2, MSH3, MSH6, MUTYH, NBN, NF1, NF2, NTHL1, PALB2, PHOX2B, PMS2, POT1, PRKAR1A, PTCH1, PTEN, RAD51C, RAD51D, RB1, RECQL, RET, SDHA, SDHAF2, SDHB, SDHC, SDHD, SMAD4, SMARCA4, SMARCB1, SMARCE1, STK11, SUFU, TMEM127, TP53, TSC1, TSC2, VHL and XRCC2 (sequencing and deletion/duplication); EGFR, EGLN1, HOXB13, KIT, MITF, PDGFRA, POLD1, and POLE (sequencing only); EPCAM and GREM1 (deletion/duplication only).  ?  ?08/12/2021 Cancer Staging  ? Staging form: Breast, AJCC 8th Edition ?- Pathologic stage from 08/12/2021: Stage IA (pT1c, pN0, cM0, G2, ER+, PR+, HER2-) - Signed by Truitt Merle, MD on 10/15/2021 ?Stage prefix: Initial diagnosis ?Histologic grading system: 3 grade system ?Residual tumor (R): R0 - None ? ?  ?08/12/2021 Definitive Surgery  ? FINAL MICROSCOPIC DIAGNOSIS:  ? ?A. BREAST, LEFT, LUMPECTOMY:  ?- Invasive and in situ ductal carcinoma, 1.7 cm.  ?- Invasive carcinoma involves superior margin.  ?- DCIS 0.1 cm from superior margin.  ?- Biopsy site and biopsy clip.  ?- See oncology table.  ?  ?08/25/2021 Pathology Results  ? FINAL MICROSCOPIC DIAGNOSIS:  ? ?A. BREAST, LEFT SUPERIOR MARGIN, EXCISION:  ?- Findings consistent with previous lumpectomy.  ?- No residual carcinoma.  ?- Final superior margin negative for carcinoma.  ?  ? ? ? ?INTERVAL HISTORY:  ?[place] DOST is here for a follow up of breast cancer. She was last seen by me on 07/28/21 in consultation. She presents to the clinic accompanied by her husband. ?She reports she did well with radiation, just some residual skin redness and itching. ?She reports she is having increased cough recently. She notes her  cardiologist NP told her it's likely related to allergies. She notes she will see her doctor himself. ?  ?All other systems were reviewed with the patient and are negative. ? ?MEDICAL HISTORY:  ?Past Medical History:  ?Diagnosis Date  ? Anxiety   ? Atrial fibrillation ([place])   ? Bilateral lower extremity edema   ? Chronic, likely related to venous stasis   ? DJD (degenerative joint disease), lumbosacral   ? Dyslipidemia   ? Dysrhythmia   ? A fib  ? Essential hypertension   ? "dx'd recently" (10/06/2016)  ? GERD (gastroesophageal reflux disease)   ? History of hepatitis C virus infection ~ 1997  ? In remission  ? 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 CT scan  ? Mitral regurgitation   ? 04/30/21 echo: bileaflet prolapse with moderate MR  ? Osteopenia   ? Paroxysmal atrial tachycardia ([place])   ? PE (pulmonary embolism) 01/2016  ? PONV (postoperative nausea and vomiting)   ? Patient states she had extremem nausea after neck surgery several years ago  ? Spondylolisthesis   ? Status post epidural injections ?3 by neurosurgery  - Dr. Joya Salm   ? Varicose veins of both legs with edema   ? Vitamin D deficiency   ? ? ?SURGICAL HISTORY: ?Past Surgical History:  ?Procedure Laterality Date  ? ATRIAL FIBRILLATION ABLATION N/A 03/09/2017  ? Procedure: Atrial Fibrillation Ablation;  Surgeon: [name], MD;  Location: [place] [facility];  Service: Cardiovascular;  Laterality: N/A;  ? BACK SURGERY    ? BREAST LUMPECTOMY WITH RADIOACTIVE SEED LOCALIZATION Left 08/12/2021  ? Procedure: LEFT BREAST LUMPECTOMY WITH RADIOACTIVE SEED LOCALIZATION;  Surgeon: Donnie Mesa, MD;  Location: [place];  Service: General;  Laterality: Left;  ? CARDIOVERSION N/A 03/21/2016  ? Procedure: CARDIOVERSION;  Surgeon: [name], MD;  Location: [place];  Service: Cardiovascular;  Laterality: N/A;  ? COMBINED HYSTEROSCOPY DIAGNOSTIC / D&C  2005  ? COMBINED HYSTEROSCOPY DIAGNOSTIC / D&C  1999  ? DILATION AND CURETTAGE OF UTERUS    ? LOOP RECORDER INSERTION  N/A 10/31/2017  ? Procedure: LOOP RECORDER INSERTION;  Surgeon: [name], MD;  Location: [place] [facility];  Service: Cardiovascular;  Laterality: N/A;  ? [place]  ? "went in front and back; broken neck"

## 2021-10-18 ENCOUNTER — Ambulatory Visit
Admission: RE | Admit: 2021-10-18 | Discharge: 2021-10-18 | Disposition: A | Discharge status: Home / Self Care | Payer: PPO | Source: Ambulatory Visit | Attending: Internal Medicine | Admitting: Internal Medicine

## 2021-10-18 ENCOUNTER — Other Ambulatory Visit: Payer: Self-pay | Admitting: Internal Medicine

## 2021-10-18 DIAGNOSIS — R059 Cough, unspecified: Secondary | ICD-10-CM

## 2021-10-20 ENCOUNTER — Encounter: Payer: Self-pay | Admitting: *Deleted

## 2021-10-20 DIAGNOSIS — C50412 Malignant neoplasm of upper-outer quadrant of left female breast: Secondary | ICD-10-CM

## 2021-11-04 NOTE — Progress Notes (Signed)
                                                                                                                                                             Patient Name: Christy [name] MRN: [id number] DOB: [birth year] Referring Physician: Wenda Low (Profile Not Attached) Date of Service: 10/15/2021 [facility], [place]                                                        End Of Treatment Note  Diagnoses: C50.412-Malignant neoplasm of upper-outer quadrant of left female breast  Cancer Staging:   Stage IA, pT1cN0M0, grade 2, ER/PR positive invasive ductal carcinoma of the left breast  Intent: Curative  Radiation Treatment Dates: 09/20/2021 through 10/15/2021 Site Technique Total Dose (Gy) Dose per Fx (Gy) Completed Fx Beam Energies  Breast, Left: Breast_L 3D 42.56/42.56 2.66 16/16 6X, 10X  Breast, Left: Breast_L_Bst specialPort 8/8 2 4/4 12E   Narrative: The patient tolerated radiation therapy relatively well. She developed anticipated skin changes in the treatment field.   Plan: The patient will receive a call in about one month from the radiation oncology department. She will continue follow up with Dr. Burr Medico as well.   ________________________________________________    Carola Rhine, [facility]

## 2021-11-08 DIAGNOSIS — M25562 Pain in left knee: Secondary | ICD-10-CM | POA: Diagnosis not present

## 2021-11-08 DIAGNOSIS — M25561 Pain in right knee: Secondary | ICD-10-CM | POA: Diagnosis not present

## 2021-11-10 ENCOUNTER — Ambulatory Visit (INDEPENDENT_AMBULATORY_CARE_PROVIDER_SITE_OTHER): Payer: PPO | Admitting: Internal Medicine

## 2021-11-10 ENCOUNTER — Encounter: Payer: Self-pay | Admitting: Internal Medicine

## 2021-11-10 VITALS — BP 116/64 | HR 62 | Ht 67.5 in | Wt 158.0 lb

## 2021-11-10 DIAGNOSIS — I480 Paroxysmal atrial fibrillation: Secondary | ICD-10-CM | POA: Diagnosis not present

## 2021-11-10 DIAGNOSIS — R062 Wheezing: Secondary | ICD-10-CM | POA: Diagnosis not present

## 2021-11-10 DIAGNOSIS — D6869 Other thrombophilia: Secondary | ICD-10-CM | POA: Diagnosis not present

## 2021-11-10 DIAGNOSIS — J300 Vasomotor rhinitis: Secondary | ICD-10-CM | POA: Diagnosis not present

## 2021-11-10 HISTORY — PX: OTHER SURGICAL HISTORY: SHX169

## 2021-11-10 NOTE — Patient Instructions (Signed)
Medication Instructions:  Your physician recommends that you continue on your current medications as directed. Please refer to the Current Medication list given to you today.  *If you need a refill on your cardiac medications before your next appointment, please call your pharmacy*   Lab Work: None ordered If you have labs (blood work) drawn today and your tests are completely normal, you will receive your results only by: [place] (if you have MyChart) OR A paper copy in the mail If you have any lab test that is abnormal or we need to change your treatment, we will call you to review the results.   Testing/Procedures: None ordered   Follow-Up: At [facility], you and your health needs are our priority.  As part of our continuing mission to provide you with exceptional heart care, we have created designated Provider Care Teams.  These Care Teams include your primary Cardiologist (physician) and Advanced Practice Providers (APPs -  Physician Assistants and Nurse Practitioners) who all work together to provide you with the care you need, when you need it.  We recommend signing up for the patient portal called "MyChart".  Sign up information is provided on this After Visit Summary.  MyChart is used to connect with patients for Virtual Visits (Telemedicine).  Patients are able to view lab/test results, encounter notes, upcoming appointments, etc.  Non-urgent messages can be sent to your provider as well.   To learn more about what you can do with MyChart, go to [email or web address].    Your next appointment:   6 month(s)  The format for your next appointment:   In Person  Provider:   You will follow up in the [place] located at [facility]. Your provider will be: Roderic Palau, NP or Clint R. Fenton, PA-C    Thank you for choosing [facility]!!   Trinidad Curet, RN [phone]  Other Instructions    Important Information About  [place]        Implantable Loop Recorder Removal, Care After This sheet gives you information about how to care for yourself after your procedure. Your health care provider may also give you more specific instructions. If you have problems or questions, contact your health care provider. What can I expect after the procedure? After the procedure, it is common to have: Soreness or discomfort near the incision. Some swelling or bruising near the incision.  Follow these instructions at home: Incision care  Monitor your cardiac device site for redness, swelling, and drainage. Call the device clinic at [phone] if you experience these symptoms or fever/chills.  Keep the large square bandage on your site for 24 hours and then you may remove it yourself. Keep the steri-strips underneath in place.   You may shower after 72 hours / 3 days from your procedure with the steri-strips in place. They will usually fall off on their own, or may be removed after 10 days. Pat dry.   Avoid lotions, ointments, or perfumes over your incision until it is well-healed.  Please do not submerge in water until your site is completely healed.   If your wound site starts to bleed apply pressure.      If you have any questions/concerns please call the device clinic at [phone].  Activity  Return to your normal activities.  Contact a health care provider if: You have redness, swelling, or pain around your incision. You have a fever.

## 2021-11-10 NOTE — Progress Notes (Signed)
PCP: Wenda Low, MD   Primary EP: Dr Richard Miu is a 81 y.o. female who presents today for routine electrophysiology followup.  Since last being seen in our clinic, the patient reports doing very well.  Today, she denies symptoms of palpitations, chest pain, shortness of breath,  lower extremity edema, dizziness, presyncope, or syncope.  The patient is otherwise without complaint today.   Past Medical History:  Diagnosis Date   Anxiety    Atrial fibrillation ([place])    Bilateral lower extremity edema    Chronic, likely related to venous stasis    DJD (degenerative joint disease), lumbosacral    Dyslipidemia    Dysrhythmia    A fib   Essential hypertension    "dx'd recently" (10/06/2016)   GERD (gastroesophageal reflux disease)    History of hepatitis C virus infection ~ 1997   In remission   Lung granuloma ([place]) 2004   CT scan   Mitral regurgitation    04/30/21 echo: bileaflet prolapse with moderate MR   Osteopenia    Paroxysmal atrial tachycardia (HCC)    PE (pulmonary embolism) 01/2016   PONV (postoperative nausea and vomiting)    Patient states she had extremem nausea after neck surgery several years ago   Spondylolisthesis    Status post epidural injections 3 by neurosurgery  - Dr. Joya Salm    Varicose veins of both legs with edema    Vitamin D deficiency    Past Surgical History:  Procedure Laterality Date   ATRIAL FIBRILLATION ABLATION N/A 03/09/2017   Procedure: Atrial Fibrillation Ablation;  Surgeon: [name], MD;  Location: [facility];  Service: Cardiovascular;  Laterality: N/A;   BACK SURGERY     BREAST LUMPECTOMY WITH RADIOACTIVE SEED LOCALIZATION Left 08/12/2021   Procedure: LEFT BREAST LUMPECTOMY WITH RADIOACTIVE SEED LOCALIZATION;  Surgeon: Donnie Mesa, MD;  Location: [facility];  Service: General;  Laterality: Left;   CARDIOVERSION N/A 03/21/2016   Procedure: CARDIOVERSION;  Surgeon: [name], MD;  Location: [facility];   Service: Cardiovascular;  Laterality: N/A;   COMBINED HYSTEROSCOPY DIAGNOSTIC / D&C  2005   COMBINED HYSTEROSCOPY DIAGNOSTIC / D&C  1999   DILATION AND CURETTAGE OF UTERUS     LOOP RECORDER INSERTION N/A 10/31/2017   Procedure: LOOP RECORDER INSERTION;  Surgeon: [name], MD;  Location: [facility];  Service: Cardiovascular;  Laterality: N/A;   [place]   "went in front and back; broken neck"   RE-EXCISION OF BREAST CANCER,SUPERIOR MARGINS Left 08/25/2021   Procedure: RE-EXCISION OF SUPERIOR MARGIN -  LEFT BREAST LUMPECTOMY;  Surgeon: Donnie Mesa, MD;  Location: [facility];  Service: General;  Laterality: Left;   TEE WITHOUT CARDIOVERSION N/A 03/08/2017   Procedure: TRANSESOPHAGEAL ECHOCARDIOGRAM (TEE);  Surgeon: Josue Hector, MD;  Location: [facility];  Service: Cardiovascular;  Laterality: N/A;   TOE SURGERY Right    replaced joint   TONSILLECTOMY     TUBAL LIGATION Bilateral 1976   VAGINAL DELIVERY     x2    ROS- all systems are reviewed and negatives except as per HPI above  Current Outpatient Medications  Medication Sig Dispense Refill   apixaban (ELIQUIS) 5 MG TABS tablet TAKE 1 TABLET BY MOUTH TWICE DAILY(START 02/03/2016) 180 tablet 3   Ascorbic Acid (VITAMIN C) 1000 MG tablet Take 1,000 mg by mouth in the morning.     betamethasone valerate (VALISONE) 0.1 % cream Apply 1 application topically 2 (two)  times daily as needed (itching.).     Cephalexin 500 MG tablet Take 500 mg by mouth 2 (two) times daily. Takes as needed for UTI's     cholecalciferol (VITAMIN D3) 25 MCG (1000 UNIT) tablet Take 1,000 Units by mouth in the morning.     dofetilide (TIKOSYN) 250 MCG capsule TAKE 1 CAPSULE BY MOUTH TWICE DAILY IN THE MORNING AND DINNER FOR HEART. Appointment Required For Further Refills [phone] 180 capsule 1   furosemide (LASIX) 20 MG tablet Take 1 tablet (20 mg total) by mouth daily as needed for fluid or edema (weight gain of 3 lbs in 24 hrs or 5 lbs in 1  week). 30 tablet 3   guaiFENesin (MUCINEX) 600 MG 12 hr tablet Take 600 mg by mouth in the morning.     loratadine (CLARITIN) 10 MG tablet Take 10 mg by mouth in the morning.     losartan (COZAAR) 25 MG tablet Take 25 mg by mouth in the morning.     Magnesium 250 MG TABS Take 250 mg by mouth at bedtime.     Multiple Vitamin (MULTIVITAMIN WITH MINERALS) TABS tablet Take 1 tablet by mouth in the morning.     pantoprazole (PROTONIX) 40 MG tablet Take 40 mg by mouth 2 (two) times daily.     phenazopyridine (PYRIDIUM) 95 MG tablet Take 95 mg by mouth 3 (three) times daily as needed for pain.     Probiotic Product (PROBIOTIC DAILY PO) Take 1 tablet by mouth every morning. May take 2 tablets as needed     propranolol (INDERAL) 20 MG tablet Take 1 tablet (20 mg total) by mouth 2 (two) times daily. 180 tablet 3   potassium chloride (KLOR-CON) 10 MEQ tablet Take 1 tablet (10 mEq total) by mouth daily. 30 tablet 6   No current facility-administered medications for this visit.    Physical Exam: Vitals:   11/10/21 1514  BP: 116/64  Pulse: 62  SpO2: 94%  Weight: 158 lb (71.7 kg)  Height: 5' 7.5" (1.715 m)    GEN- The patient is well appearing, alert and oriented x 3 today.   Head- normocephalic, atraumatic Eyes-  Sclera clear, conjunctiva pink Ears- hearing intact Oropharynx- clear Lungs- Clear to ausculation bilaterally, normal work of breathing Heart- Regular rate and rhythm, no murmurs, rubs or gallops, PMI not laterally displaced GI- soft, NT, ND, + BS Extremities- no clubbing, cyanosis, or edema  Wt Readings from Last 3 Encounters:  11/10/21 158 lb (71.7 kg)  10/15/21 158 lb 8 oz (71.9 kg)  10/13/21 158 lb 12.8 oz (72 kg)    EKG tracing ordered today is personally reviewed and shows sinus with PVCs, QTc 450 msec  Assessment and Plan:  Paroxysmal atrial fibrillation/ atypical atrial flutter Well controlled post ablation with tikosyn despite severe biatrial enlargement Her ILR is  at RRT   she wishes to have this removed and not replaced. We discussed at length today the risks and benefits to ILR removal.  She understands that risks include but are not limited to bleeding and infection and wishes to proceed. Labs 07/28/21 reviewed  2. HTN Stable No change required today   Risks, benefits and potential toxicities for medications prescribed and/or refilled reviewed with patient today.   Follow-up in AF clinic in 6 months  [name] MD, [facility] 11/10/2021 3:15 PM       PROCEDURES:   1. Implantable loop recorder explantation     DESCRIPTION OF PROCEDURE:  Informed written consent  was obtained.  The patient required no sedation for the procedure today.   The patients left chest was therefore prepped and draped in the usual sterile fashion.  The skin overlying the ILR monitor was infiltrated with lidocaine for local analgesia.  A 0.5-cm incision was made over the site.  The previously implanted ILR was exposed and removed using a combination of sharp and blunt dissection.  Steri- Strips and a sterile dressing were then applied. EBL<38m.  There were no early apparent complications.     CONCLUSIONS:   1. Successful explantation of a [employer] Reveal LINQ implantable loop recorder   2. No early apparent complications.     Follow-up in AF clinic in 6 months    JThompson GrayerMD, FEn[facility] Rehabilitation Hospital Of Manati5/24/2023 3:46 PM

## 2021-11-11 ENCOUNTER — Telehealth: Payer: Self-pay | Admitting: *Deleted

## 2021-11-23 ENCOUNTER — Ambulatory Visit
Admission: RE | Admit: 2021-11-23 | Discharge: 2021-11-23 | Disposition: A | Discharge status: Home / Self Care | Payer: PPO | Source: Ambulatory Visit | Attending: Radiation Oncology | Admitting: Radiation Oncology

## 2021-11-23 DIAGNOSIS — C50412 Malignant neoplasm of upper-outer quadrant of left female breast: Secondary | ICD-10-CM | POA: Insufficient documentation

## 2021-11-23 DIAGNOSIS — Z170 Estrogen receptor positive status [ER+]: Secondary | ICD-10-CM | POA: Insufficient documentation

## 2021-11-29 NOTE — Progress Notes (Addendum)
  Radiation Oncology         [phone]) [phone] ________________________________  Name: Christy [name] MRN: [id number]  Date of Service: 11/23/2021  DOB: Oct 10, 1940  Post Treatment Telephone Note  Diagnosis:   Stage IA, pT1cN0M0, grade 2, ER/PR positive invasive ductal carcinoma of the left breast  Intent: Curative  Radiation Treatment Dates: 09/20/2021 through 10/15/2021 Site Technique Total Dose (Gy) Dose per Fx (Gy) Completed Fx Beam Energies  Breast, Left: Breast_L 3D 42.56/42.56 2.66 16/16 6X, 10X  Breast, Left: Breast_L_Bst specialPort 8/8 2 4/4 12E   Narrative: The patient tolerated radiation therapy relatively well. She developed anticipated skin changes in the treatment field. She feels as though her skin changes have resolved and she does have some shooting pain in the breast occasionally but it sounds as though this is self limiting within minutes. No other complaints are verbalized.   Impression/Plan: 1. Stage IA, pT1cN0M0, grade 2, ER/PR positive invasive ductal carcinoma of the left breast. The patient has been doing well since completion of radiotherapy. We discussed that we would be happy to continue to follow her as needed, but she will also continue to follow up with Dr. Burr Medico in medical oncology. She was counseled on skin care as well as measures to avoid sun exposure to this area.  2. Survivorship. We discussed the importance of survivorship evaluation and encouraged her to attend her upcoming visit with that clinic.      Carola Rhine, PAC

## 2021-12-09 ENCOUNTER — Telehealth: Payer: Self-pay | Admitting: *Deleted

## 2021-12-10 ENCOUNTER — Telehealth: Payer: Self-pay | Admitting: Hematology

## 2021-12-15 ENCOUNTER — Telehealth: Payer: Self-pay | Admitting: Internal Medicine

## 2021-12-15 NOTE — Telephone Encounter (Signed)
Pt states that she is having pain where her Loop Monitor was put in. She would like a call back from nurse. Please advise

## 2021-12-15 NOTE — Telephone Encounter (Signed)
Spoke with patient patient her loop explanted on 11/10/21, patient stated that site was not red, draining incision site was closed informed patient that likely some scar tissue is starting to form as patient states it feels like the loop recorder is still there patient stated she will just watch the site and call back if anything else develops such as increase in pain, redness etc

## 2021-12-23 ENCOUNTER — Telehealth: Payer: Self-pay | Admitting: Adult Health

## 2021-12-23 NOTE — Telephone Encounter (Signed)
Rescheduled appointment per provider PAL. Patient is aware of the changes made to her upcoming appointment. 

## 2021-12-27 ENCOUNTER — Telehealth: Payer: Self-pay | Admitting: Internal Medicine

## 2021-12-27 NOTE — Telephone Encounter (Signed)
Patient c/o Palpitations:  High priority if patient c/o lightheadedness, shortness of breath, or chest pain  How long have you had palpitations/irregular HR/ Afib? Are you having the symptoms now? For about 3 days.   Are you currently experiencing lightheadedness, SOB or CP? No  Do you have a history of afib (atrial fibrillation) or irregular heart rhythm? Yes/  Have you checked your BP or HR? (document readings if available): 121/80         Are you experiencing any other symptoms? No.

## 2021-12-27 NOTE — Telephone Encounter (Signed)
Returned call to Pt.  She states she started having an elevated irregular heart rate starting Saturday night.  It has been continuous since then.  She states her heart rate has been in the 100's which is unusual for her.  Normally her heart rate is 57-67.  She did not seem symptomatic.  Scheduled Pt with afib clinic for EKG and evaluation 12/28/2021.  Pt thanked nurse for appointment.

## 2021-12-28 ENCOUNTER — Other Ambulatory Visit (HOSPITAL_COMMUNITY): Payer: Self-pay | Admitting: Nurse Practitioner

## 2021-12-28 ENCOUNTER — Encounter (HOSPITAL_COMMUNITY): Payer: Self-pay | Admitting: Nurse Practitioner

## 2021-12-28 ENCOUNTER — Ambulatory Visit (HOSPITAL_COMMUNITY)
Admission: RE | Admit: 2021-12-28 | Discharge: 2021-12-28 | Disposition: A | Discharge status: Home / Self Care | Payer: PPO | Source: Ambulatory Visit | Attending: Nurse Practitioner | Admitting: Nurse Practitioner

## 2021-12-28 VITALS — BP 128/76 | HR 61 | Ht 67.5 in | Wt 154.8 lb

## 2021-12-28 DIAGNOSIS — I4891 Unspecified atrial fibrillation: Secondary | ICD-10-CM | POA: Diagnosis not present

## 2021-12-28 DIAGNOSIS — Z86718 Personal history of other venous thrombosis and embolism: Secondary | ICD-10-CM | POA: Insufficient documentation

## 2021-12-28 DIAGNOSIS — Z7901 Long term (current) use of anticoagulants: Secondary | ICD-10-CM | POA: Insufficient documentation

## 2021-12-28 DIAGNOSIS — I10 Essential (primary) hypertension: Secondary | ICD-10-CM | POA: Insufficient documentation

## 2021-12-28 DIAGNOSIS — Z86711 Personal history of pulmonary embolism: Secondary | ICD-10-CM | POA: Insufficient documentation

## 2021-12-28 DIAGNOSIS — D6869 Other thrombophilia: Secondary | ICD-10-CM

## 2021-12-28 DIAGNOSIS — I480 Paroxysmal atrial fibrillation: Secondary | ICD-10-CM | POA: Diagnosis not present

## 2021-12-28 NOTE — Progress Notes (Signed)
Primary Care Physician: Wenda Low, MD Referring Physician: Dr. Caprice Renshaw Christy [name] is a 81 y.o. female with a h/o atrial fib and PE/DVT 01/2016. She is has been treated with Rythmol and Tikosyn in the past. When her burden increased on Tikosyn, it was decided to pursue ablation 02/26/17.   She is in the afib clinic today, 04/10/19, for f/u of Tikosyn past ablation. She feels good. She has been staying in [place]. She has a linq, has intermittent palpitations but per Dr. Jackalyn Lombard note disorganized atrial activity, but not afib. She is leaving for [place] in Feb/March,2021 for a couple of months. Continues on eliquis 5 mg bid for a CHA2DS2VASc score of 5, no bleeding issues.  F/u in afib clinic, 07/18/19. She reports that she had Covid 19 the earlier part of the month while she and her husband were at the beach. They believe the exposure was from  2 furniture  delivery men  that were in their house for awhile, as they did not come into contact with other people and did not eat in restaurants.  She  and her husband both came down with it and received the antibody infusion at [facility] hospital. She overall felt better after that and she feels their symptoms were fairly mild. She did not have any afib while she was sick with covid. Continues on tikosyn and eliquis with a CHA2DS2VASc score of 5.  F/u in afib clinic, 08/27/19, pt called to office saying that she is seeing HR's by BP cuff in the 40's at home even though she feels well. We ran a Link report that shows PAC's as well as episodes of afib that usually  last around mins to one hour. Pt does not feel these episodes. Her longest afib episode was 50 mins on 2/26 and 2/27 with v rates 120-150, again pt did not feel this. EKG today shows SR with pac's. She feels well today.  F/u in afib clinic, 08/27/20. Afib has been quiet recently. She saw Dr. Rayann Heman 06/01/21 and by his assessment then, he did not feel she was a repeat ablation candidate. She continues on  tikosyn. No bleeding issues with eliquis with a CHA2DS2VASc score of 5.   F/u in afib clinic, 03/03/21. She remains on Tikosyn. One episode of afib that lasted around 24 hours and resolved on its own. She has an apple watch now so we discussed how she can track this as well as with her Linq. Last Linq report in September did not show any arrhythmia. Continues on eliquis 5 mg bid for a CHA2DS2VASc  score of 5.  F/u in afib clinic, 10/13/21  for Tikosyn surveillance. Qt is stable. Only one episode of Afib lasting less than 8 hours. Remain complaint on Tikosyn.  She was dx with  early L breast CA in February, treated with lumpectomy and  will be finishing radiation soon. Has had a congested sough x 2 weeks. PCP has evaluated.   F/u in afib clinic, 12/28/21. She developed afib on Saturday. She has been complaint with Tikosyn and anticoagulation. No known triggers. She had her Linq explanted in May. She was in her garage this am and became lightheaded for a few seconds. Afther that she felt like she was back in [place]. EKG today confirms this. She has some stress recently which may have contributed to the afib.    Today, she denies symptoms of palpitations, chest pain, shortness of breath, orthopnea, PND, lower extremity edema, dizziness, presyncope, syncope,  or neurologic sequela. The patient is tolerating medications without difficulties and is otherwise without complaint today.   Past Medical History:  Diagnosis Date   Anxiety    Atrial fibrillation ([place])    Bilateral lower extremity edema    Chronic, likely related to venous stasis    DJD (degenerative joint disease), lumbosacral    Dyslipidemia    Dysrhythmia    A fib   Essential hypertension    "dx'd recently" (10/06/2016)   GERD (gastroesophageal reflux disease)    History of hepatitis C virus infection ~ 1997   In remission   Lung granuloma ([place]) 2004   CT scan   Mitral regurgitation    04/30/21 echo: bileaflet prolapse with moderate MR    Osteopenia    Paroxysmal atrial tachycardia (HCC)    PE (pulmonary embolism) 01/2016   PONV (postoperative nausea and vomiting)    Patient states she had extremem nausea after neck surgery several years ago   Spondylolisthesis    Status post epidural injections 3 by neurosurgery  - Dr. Joya Salm    Varicose veins of both legs with edema    Vitamin D deficiency    Past Surgical History:  Procedure Laterality Date   ATRIAL FIBRILLATION ABLATION N/A 03/09/2017   Procedure: Atrial Fibrillation Ablation;  Surgeon: [name], MD;  Location: [facility] [facility];  Service: Cardiovascular;  Laterality: N/A;   BACK SURGERY     BREAST LUMPECTOMY WITH RADIOACTIVE SEED LOCALIZATION Left 08/12/2021   Procedure: LEFT BREAST LUMPECTOMY WITH RADIOACTIVE SEED LOCALIZATION;  Surgeon: Donnie Mesa, MD;  Location: [place];  Service: General;  Laterality: Left;   CARDIOVERSION N/A 03/21/2016   Procedure: CARDIOVERSION;  Surgeon: [name], MD;  Location: [place];  Service: Cardiovascular;  Laterality: N/A;   COMBINED HYSTEROSCOPY DIAGNOSTIC / D&C  2005   COMBINED HYSTEROSCOPY DIAGNOSTIC / D&C  1999   DILATION AND CURETTAGE OF UTERUS     implantable loop recorder removal  11/10/2021   LOOP RECORDER INSERTION N/A 10/31/2017   Procedure: LOOP RECORDER INSERTION;  Surgeon: [name], MD;  Location: [facility] [facility];  Service: Cardiovascular;  Laterality: N/A;   [place]   "went in front and back; broken neck"   RE-EXCISION OF BREAST CANCER,SUPERIOR MARGINS Left 08/25/2021   Procedure: RE-EXCISION OF SUPERIOR MARGIN -  LEFT BREAST LUMPECTOMY;  Surgeon: Donnie Mesa, MD;  Location: [place];  Service: General;  Laterality: Left;   TEE WITHOUT CARDIOVERSION N/A 03/08/2017   Procedure: TRANSESOPHAGEAL ECHOCARDIOGRAM (TEE);  Surgeon: Josue Hector, MD;  Location: [facility];  Service: Cardiovascular;  Laterality: N/A;   TOE SURGERY Right    replaced joint   TONSILLECTOMY     TUBAL  LIGATION Bilateral 1976   VAGINAL DELIVERY     x2    Current Outpatient Medications  Medication Sig Dispense Refill   apixaban (ELIQUIS) 5 MG TABS tablet TAKE 1 TABLET BY MOUTH TWICE DAILY(START 02/03/2016) 180 tablet 3   Ascorbic Acid (VITAMIN C) 1000 MG tablet Take 1,000 mg by mouth in the morning.     betamethasone valerate (VALISONE) 0.1 % cream Apply 1 application topically 2 (two) times daily as needed (itching.).     Cephalexin 500 MG tablet Take 500 mg by mouth 2 (two) times daily. Takes as needed for UTI's     cholecalciferol (VITAMIN D3) 25 MCG (1000 UNIT) tablet Take 1,000 Units by mouth in the morning.     dofetilide (TIKOSYN) 250 MCG capsule TAKE  1 CAPSULE BY MOUTH TWICE DAILY IN THE MORNING AND DINNER FOR HEART. Appointment Required For Further Refills [phone] 180 capsule 1   furosemide (LASIX) 20 MG tablet Take 1 tablet (20 mg total) by mouth daily as needed for fluid or edema (weight gain of 3 lbs in 24 hrs or 5 lbs in 1 week). 30 tablet 3   guaiFENesin (MUCINEX) 600 MG 12 hr tablet Take 600 mg by mouth in the morning.     loratadine (CLARITIN) 10 MG tablet Take 10 mg by mouth in the morning.     losartan (COZAAR) 25 MG tablet Take 25 mg by mouth in the morning.     Magnesium 250 MG TABS Take 250 mg by mouth at bedtime.     Multiple Vitamin (MULTIVITAMIN WITH MINERALS) TABS tablet Take 1 tablet by mouth in the morning.     pantoprazole (PROTONIX) 40 MG tablet Take 40 mg by mouth 2 (two) times daily.     phenazopyridine (PYRIDIUM) 95 MG tablet Take 95 mg by mouth 3 (three) times daily as needed for pain.     potassium chloride (KLOR-CON) 10 MEQ tablet TAKE 1 TABLET(10 MEQ) BY MOUTH DAILY 30 tablet 6   Probiotic Product (PROBIOTIC DAILY PO) Take 1 tablet by mouth every morning. May take 2 tablets as needed     propranolol (INDERAL) 20 MG tablet Take 1 tablet (20 mg total) by mouth 2 (two) times daily. 180 tablet 3   No current facility-administered medications for this  encounter.    Allergies  Allergen Reactions   Caffeine     Heart palpitations   Epinephrine Palpitations    Social History   Socioeconomic History   Marital status: Married    Spouse name: Not on file   Number of children: 2   Years of education: Not on file   Highest education level: Not on file  Occupational History   Not on file  Tobacco Use   Smoking status: Never   Smokeless tobacco: Never  Vaping Use   Vaping Use: Never used  Substance and Sexual Activity   Alcohol use: Yes    Alcohol/week: 14.0 standard drinks of alcohol    Types: 14 Glasses of wine per week    Comment: occasional   Drug use: No   Sexual activity: Yes    Birth control/protection: Other-see comments    Comment: tubal ligation  Other Topics Concern   Not on file  Social History Narrative   She is a married mother of 1. Lives with her husband. 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 She has never smoked and drinks up to 10 glasses of wine or so week.   She usually exercises roughly 3 days a week doing yoga and plays golf. She is mostly limited by her "time constraints "   Social Determinants of Radio broadcast assistant Strain: Not on file  Food Insecurity: Not on file  Transportation Needs: Not on file  Physical Activity: Not on file  Stress: Not on file  Social Connections: Not on file  Intimate Partner Violence: Not on file    Family History  Problem Relation Age of Onset   Heart Problems Mother    CVA Mother    Heart attack Mother 66   CVA Father    Colon cancer Neg Hx    Clotting disorder Neg Hx     ROS- All systems are reviewed and negative except as per the HPI above  Physical  Exam: There were no vitals filed for this visit.  Wt Readings from Last 3 Encounters:  11/10/21 71.7 kg  10/15/21 71.9 kg  10/13/21 72 kg    Labs: Lab Results  Component Value Date   NA 141 07/28/2021   K 4.4 07/28/2021   CL 103 07/28/2021   CO2 32 07/28/2021   GLUCOSE 89  07/28/2021   BUN 20 07/28/2021   CREATININE 0.91 07/28/2021   CALCIUM 10.0 07/28/2021   MG 2.0 10/13/2021   Lab Results  Component Value Date   INR 1.1 03/17/2016   No results found for: "CHOL", "HDL", "LDLCALC", "TRIG"   GEN- The patient is well appearing, alert and oriented x 3 today.   Head- normocephalic, atraumatic Eyes-  Sclera clear, conjunctiva pink Ears- hearing intact Oropharynx- clear Neck- supple, no JVP Lymph- no cervical lymphadenopathy Lungs- Clear to ausculation bilaterally, normal work of breathing Heart- Regular rate and rhythm, no murmurs, rubs or gallops, PMI not laterally displaced GI- soft, NT, ND, + BS Extremities- no clubbing, cyanosis, or edema MS- no significant deformity or atrophy Skin- no rash or lesion Psych- euthymic mood, full affect Neuro- strength and sensation are intact  EKG- Vent. rate 61 BPM PR interval 296 ms QRS duration 74 ms QT/QTcB 428/430 ms P-R-T axes 71 31 49 Sinus rhythm with 1st degree A-V block Otherwise normal ECG When compared with ECG of 13-Oct-2021 09:32, PREVIOUS ECG IS PRESENT    Assessment and Plan: 1. Paroxysmal atrial fibrillation S/p ablation 02/2017  Staying in SR, afib breakthrough x 2 days recently and self converted with what sounds like a conversion pause  Pt does not want her Linq reimplanted  Continue dofetilide 250 mcg bid/BB without change Continue eliquis 5 mg bid with chadsvasc score of 5  2. HTN Stable  F/u with  afib clinic in the fall as scheduled  for tikosyn surveillance    Butch Penny C. Jakhiya Brower, [facility] [address] [place], [place] [address] [phone]

## 2022-01-06 DIAGNOSIS — D485 Neoplasm of uncertain behavior of skin: Secondary | ICD-10-CM | POA: Diagnosis not present

## 2022-01-06 DIAGNOSIS — L820 Inflamed seborrheic keratosis: Secondary | ICD-10-CM | POA: Diagnosis not present

## 2022-01-06 DIAGNOSIS — D0461 Carcinoma in situ of skin of right upper limb, including shoulder: Secondary | ICD-10-CM | POA: Diagnosis not present

## 2022-01-06 DIAGNOSIS — Z85828 Personal history of other malignant neoplasm of skin: Secondary | ICD-10-CM | POA: Diagnosis not present

## 2022-01-31 ENCOUNTER — Encounter: Payer: PPO | Admitting: Adult Health

## 2022-02-01 ENCOUNTER — Telehealth: Payer: Self-pay | Admitting: Internal Medicine

## 2022-02-01 NOTE — Telephone Encounter (Signed)
Direct call received to triage in regards to a low heart rate.  Pt has PAF.  She went into afib Monday around 12:30 pm.  She took her normal evening medicines and afib continued.    At 12:30 am this morning she took an extra dose of propranolol.  When she woke up at 5:30 am she took all of her normal morning medicines without checking BP/pulse.  After she had taken her morning medicines she check her BP and pulse and her BP was "81 over something" and her pulse was 35 BPM.  She states she feels weak.  Advised Pt to eat a salty snack and push fluids.    Advised if she takes an additional propranolol she should check her BP and pulse before taking her scheduled medications.  Advised Pt to change positions slowly until her BP and pulse normalize.  She indicates understanding of education and will call with further needs.

## 2022-02-01 NOTE — Telephone Encounter (Signed)
STAT if HR is under 50 or over 120 (normal HR is 60-100 beats per minute)  What is your heart rate? 37  Do you have a log of your heart rate readings (document readings)?   Do you have any other symptoms? Afib from 12:30 yesterday until this morning  at 2:30- felt like she was  going  to pass out

## 2022-02-02 ENCOUNTER — Other Ambulatory Visit: Payer: Self-pay

## 2022-02-02 ENCOUNTER — Inpatient Hospital Stay: Payer: PPO | Attending: Adult Health | Admitting: Adult Health

## 2022-02-02 ENCOUNTER — Encounter: Payer: Self-pay | Admitting: Adult Health

## 2022-02-02 ENCOUNTER — Encounter: Payer: Self-pay | Admitting: Surgery

## 2022-02-02 VITALS — BP 120/58 | HR 60 | Temp 97.7°F | Resp 16 | Ht 67.0 in | Wt 157.1 lb

## 2022-02-02 DIAGNOSIS — C50412 Malignant neoplasm of upper-outer quadrant of left female breast: Secondary | ICD-10-CM | POA: Diagnosis not present

## 2022-02-02 DIAGNOSIS — Z853 Personal history of malignant neoplasm of breast: Secondary | ICD-10-CM | POA: Diagnosis not present

## 2022-02-02 DIAGNOSIS — Z923 Personal history of irradiation: Secondary | ICD-10-CM | POA: Insufficient documentation

## 2022-02-02 DIAGNOSIS — Z170 Estrogen receptor positive status [ER+]: Secondary | ICD-10-CM

## 2022-02-02 NOTE — Progress Notes (Signed)
SURVIVORSHIP VISIT:   BRIEF ONCOLOGIC HISTORY:  Oncology History Overview Note   Cancer Staging  Malignant neoplasm of upper-outer quadrant of left breast in female, estrogen receptor positive ([place]) Staging form: Breast, AJCC 8th Edition - Clinical stage from 07/20/2021: Stage IA (cT1c, cN0, cM0, G1, ER+, PR+, HER2-) - Signed by Truitt Merle, MD on 07/27/2021    Malignant neoplasm of upper-outer quadrant of left breast in female, estrogen receptor positive ([place])  07/15/2021 Mammogram   Exam: Breast Ultrasound - L  There is an irregular mass with a spiculated margin in the left breast at 12 o'clock posterior depth 8 cm from the nipple. It measures 1.4 x 1.1 x 0.9 cm. This irregular mass is hypoechoic. This correlated with mammography findings.  Ultrasound of the left axilla is negative for lymphadenopathy. Multiple normal lymph nodes are seen.  IMPRESSION: The irregular mass in the left breast is highly suggestive of malignancy.   07/20/2021 Cancer Staging   Staging form: Breast, AJCC 8th Edition - Clinical stage from 07/20/2021: Stage IA (cT1c, cN0, cM0, G1, ER+, PR+, HER2-) - Signed by Truitt Merle, MD on 07/27/2021 Stage prefix: Initial diagnosis Histologic grading system: 3 grade system   07/20/2021 Initial Biopsy   Diagnosis Breast, left, needle core biopsy, left breast mass 12:00 8 cmfn - INVASIVE DUCTAL CARCINOMA - DUCTAL CARCINOMA IN SITU - SEE COMMENT Microscopic Comment Based on the biopsy, the carcinoma appears Nottingham grade 1-2 of 3 and measures 0.5 cm in greatest linear extent.  PROGNOSTIC INDICATORS Results: Estrogen Receptor: 100%, POSITIVE, STRONG STAINING INTENSITY Progesterone Receptor: 95%, POSITIVE, STRONG STAINING INTENSITY Proliferation Marker Ki67: 5%  FLUORESCENCE IN-SITU HYBRIDIZATION Results: GROUP 5: HER2 **NEGATIVE**   07/26/2021 Initial Diagnosis   Malignant neoplasm of upper-outer quadrant of left breast in female, estrogen receptor positive ([place])     Genetic Testing   [facility] CancerNext-Expanded Panel was Negative. Report date is 08/08/2021.  The CancerNext-Expanded gene panel offered by [facility] and includes sequencing, rearrangement, and RNA analysis for the following 77 genes: AIP, ALK, APC, ATM, AXIN2, BAP1, BARD1, BLM, BMPR1A, BRCA1, BRCA2, BRIP1, CDC73, CDH1, CDK4, CDKN1B, CDKN2A, CHEK2, CTNNA1, DICER1, FANCC, FH, FLCN, GALNT12, KIF1B, LZTR1, MAX, MEN1, MET, MLH1, MSH2, MSH3, MSH6, MUTYH, NBN, NF1, NF2, NTHL1, PALB2, PHOX2B, PMS2, POT1, PRKAR1A, PTCH1, PTEN, RAD51C, RAD51D, RB1, RECQL, RET, SDHA, SDHAF2, SDHB, SDHC, SDHD, SMAD4, SMARCA4, SMARCB1, SMARCE1, STK11, SUFU, TMEM127, TP53, TSC1, TSC2, VHL and XRCC2 (sequencing and deletion/duplication); EGFR, EGLN1, HOXB13, KIT, MITF, PDGFRA, POLD1, and POLE (sequencing only); EPCAM and GREM1 (deletion/duplication only).    08/12/2021 Cancer Staging   Staging form: Breast, AJCC 8th Edition - Pathologic stage from 08/12/2021: Stage IA (pT1c, pN0, cM0, G2, ER+, PR+, HER2-) - Signed by Truitt Merle, MD on 10/15/2021 Stage prefix: Initial diagnosis Histologic grading system: 3 grade system Residual tumor (R): R0 - None   08/12/2021 Definitive Surgery   FINAL MICROSCOPIC DIAGNOSIS:   A. BREAST, LEFT, LUMPECTOMY:  - Invasive and in situ ductal carcinoma, 1.7 cm.  - Invasive carcinoma involves superior margin.  - DCIS 0.1 cm from superior margin.  - Biopsy site and biopsy clip.  - See oncology table.    08/25/2021 Pathology Results   FINAL MICROSCOPIC DIAGNOSIS:   A. BREAST, LEFT SUPERIOR MARGIN, EXCISION:  - Findings consistent with previous lumpectomy.  - No residual carcinoma.  - Final superior margin negative for carcinoma.    09/20/2021 - 10/15/2021 Radiation Therapy   Site Technique Total Dose (Gy) Dose per Fx (Gy) Completed Fx Beam Energies  Breast, Left: Breast_L 3D 42.56/42.56 2.66 16/16 6X, 10X  Breast, Left: Breast_L_Bst specialPort 8/8 2 4/4 12E       INTERVAL HISTORY:  Christy [name] to review her survivorship care plan detailing her treatment course for breast cancer, as well as monitoring long-term side effects of that treatment, education regarding health maintenance, screening, and overall wellness and health promotion.     Overall, Christy [name] reports feeling quite well.  She has some occasional swelling in her left breast that she has experienced in addition to some tightness with left arm extension that she wants to know what she could do about.  REVIEW OF SYSTEMS:  Review of Systems  Constitutional:  Negative for appetite change, chills, fatigue, fever and unexpected weight change.  HENT:   Negative for hearing loss, lump/mass and trouble swallowing.   Eyes:  Negative for eye problems and icterus.  Respiratory:  Negative for chest tightness, cough and shortness of breath.   Cardiovascular:  Negative for chest pain, leg swelling and palpitations.  Gastrointestinal:  Negative for abdominal distention, abdominal pain, constipation, diarrhea, nausea and vomiting.  Endocrine: Negative for hot flashes.  Genitourinary:  Negative for difficulty urinating.   Musculoskeletal:  Negative for arthralgias.  Skin:  Negative for itching and rash.  Neurological:  Negative for dizziness, extremity weakness, headaches and numbness.  Hematological:  Negative for adenopathy. Does not bruise/bleed easily.  Psychiatric/Behavioral:  Negative for depression. The patient is not nervous/anxious.      ONCOLOGY TREATMENT TEAM:  1. Surgeon:  Dr. Georgette Dover at [facility] 2. Medical Oncologist: Dr. Burr Medico  3. Radiation Oncologist: Dr. Lisbeth Renshaw    PAST MEDICAL/SURGICAL HISTORY:  Past Medical History:  Diagnosis Date   Anxiety    Atrial fibrillation ([place])    Bilateral lower extremity edema    Chronic, likely related to venous stasis    DJD (degenerative joint disease), lumbosacral    Dyslipidemia    Dysrhythmia    A fib   Essential hypertension    "dx'd recently"  (10/06/2016)   GERD (gastroesophageal reflux disease)    History of hepatitis C virus infection ~ 1997   In remission   Lung granuloma ([place]) 2004   CT scan   Mitral regurgitation    04/30/21 echo: bileaflet prolapse with moderate MR   Osteopenia    Paroxysmal atrial tachycardia ([facility])    PE (pulmonary embolism) 01/2016   PONV (postoperative nausea and vomiting)    Patient states she had extremem nausea after neck surgery several years ago   Spondylolisthesis    Status post epidural injections 3 by neurosurgery  - Dr. Joya Salm    Varicose veins of both legs with edema    Vitamin D deficiency    Past Surgical History:  Procedure Laterality Date   ATRIAL FIBRILLATION ABLATION N/A 03/09/2017   Procedure: Atrial Fibrillation Ablation;  Surgeon: [name], MD;  Location: [place] [facility];  Service: Cardiovascular;  Laterality: N/A;   BACK SURGERY     BREAST LUMPECTOMY WITH RADIOACTIVE SEED LOCALIZATION Left 08/12/2021   Procedure: LEFT BREAST LUMPECTOMY WITH RADIOACTIVE SEED LOCALIZATION;  Surgeon: Donnie Mesa, MD;  Location: [place];  Service: General;  Laterality: Left;   CARDIOVERSION N/A 03/21/2016   Procedure: CARDIOVERSION;  Surgeon: [name], MD;  Location: [place];  Service: Cardiovascular;  Laterality: N/A;   COMBINED HYSTEROSCOPY DIAGNOSTIC / D&C  2005   COMBINED HYSTEROSCOPY DIAGNOSTIC / D&C  1999   DILATION AND CURETTAGE OF UTERUS  implantable loop recorder removal  11/10/2021   LOOP RECORDER INSERTION N/A 10/31/2017   Procedure: LOOP RECORDER INSERTION;  Surgeon: [name], MD;  Location: [place] [facility];  Service: Cardiovascular;  Laterality: N/A;   [place]   "went in front and back; broken neck"   RE-EXCISION OF BREAST CANCER,SUPERIOR MARGINS Left 08/25/2021   Procedure: RE-EXCISION OF SUPERIOR MARGIN -  LEFT BREAST LUMPECTOMY;  Surgeon: Donnie Mesa, MD;  Location: [place];  Service: General;  Laterality: Left;   TEE WITHOUT  CARDIOVERSION N/A 03/08/2017   Procedure: TRANSESOPHAGEAL ECHOCARDIOGRAM (TEE);  Surgeon: Josue Hector, MD;  Location: [facility];  Service: Cardiovascular;  Laterality: N/A;   TOE SURGERY Right    replaced joint   TONSILLECTOMY     TUBAL LIGATION Bilateral 1976   VAGINAL DELIVERY     x2     ALLERGIES:  Allergies  Allergen Reactions   Caffeine     Heart palpitations   Epinephrine Palpitations     CURRENT MEDICATIONS:  Outpatient Encounter Medications as of 02/02/2022  Medication Sig   apixaban (ELIQUIS) 5 MG TABS tablet TAKE 1 TABLET BY MOUTH TWICE DAILY(START 02/03/2016)   Ascorbic Acid (VITAMIN C) 1000 MG tablet Take 1,000 mg by mouth in the morning.   betamethasone valerate (VALISONE) 0.1 % cream Apply 1 application topically 2 (two) times daily as needed (itching.).   Cephalexin 500 MG tablet Take 500 mg by mouth 2 (two) times daily. Takes as needed for UTI's   cholecalciferol (VITAMIN D3) 25 MCG (1000 UNIT) tablet Take 1,000 Units by mouth in the morning.   dofetilide (TIKOSYN) 250 MCG capsule TAKE 1 CAPSULE BY MOUTH TWICE DAILY IN THE MORNING AND DINNER FOR HEART. Appointment Required For Further Refills [phone]   furosemide (LASIX) 20 MG tablet Take 1 tablet (20 mg total) by mouth daily as needed for fluid or edema (weight gain of 3 lbs in 24 hrs or 5 lbs in 1 week).   loratadine (CLARITIN) 10 MG tablet Take 10 mg by mouth in the morning. (Patient not taking: Reported on 12/28/2021)   losartan (COZAAR) 25 MG tablet Take 25 mg by mouth in the morning.   Magnesium 250 MG TABS Take 250 mg by mouth at bedtime.   Multiple Vitamin (MULTIVITAMIN WITH MINERALS) TABS tablet Take 1 tablet by mouth in the morning.   pantoprazole (PROTONIX) 40 MG tablet Take 40 mg by mouth 2 (two) times daily.   potassium chloride (KLOR-CON) 10 MEQ tablet TAKE 1 TABLET(10 MEQ) BY MOUTH DAILY   Probiotic Product (PROBIOTIC DAILY PO) Take 1 tablet by mouth every morning. May take 2 tablets as  needed   propranolol (INDERAL) 20 MG tablet Take 1 tablet (20 mg total) by mouth 2 (two) times daily.   No facility-administered encounter medications on file as of 02/02/2022.     ONCOLOGIC FAMILY HISTORY:  Family History  Problem Relation Age of Onset   Heart Problems Mother    CVA Mother    Heart attack Mother 25   CVA Father    Colon cancer Neg Hx    Clotting disorder Neg Hx      SOCIAL HISTORY:  Social History   Socioeconomic History   Marital status: Married    Spouse name: Not on file   Number of children: 2   Years of education: Not on file   Highest education level: Not on file  Occupational History   Not on file  Tobacco Use  Smoking status: Never   Smokeless tobacco: Never  Vaping Use   Vaping Use: Never used  Substance and Sexual Activity   Alcohol use: Yes    Alcohol/week: 14.0 standard drinks of alcohol    Types: 14 Glasses of wine per week    Comment: occasional   Drug use: No   Sexual activity: Yes    Birth control/protection: Other-see comments    Comment: tubal ligation  Other Topics Concern   Not on file  Social History Narrative   She is a married mother of 1. Lives with her husband. 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 She has never smoked and drinks up to 10 glasses of wine or so week.   She usually exercises roughly 3 days a week doing yoga and plays golf. She is mostly limited by her "time constraints "   Social Determinants of Radio broadcast assistant Strain: Not on file  Food Insecurity: Not on file  Transportation Needs: Not on file  Physical Activity: Not on file  Stress: Not on file  Social Connections: Not on file  Intimate Partner Violence: Not on file     OBSERVATIONS/OBJECTIVE:  BP (!) 120/58 (BP Location: Left Arm, Patient Position: Sitting)   Pulse 60   Temp 97.7 F (36.5 C) (Temporal)   Resp 16   Ht 5' 7"  (1.702 m)   Wt 157 lb 1.6 oz (71.3 kg)   SpO2 100%   BMI 24.61 kg/m  GENERAL: Patient is a  well appearing female in no acute distress HEENT:  Sclerae anicteric.  Oropharynx clear and moist. No ulcerations or evidence of oropharyngeal candidiasis. Neck is supple.  NODES:  No cervical, supraclavicular, or axillary lymphadenopathy palpated.  BREAST EXAM: Right breast is benign left breast status postlumpectomy and radiation no sign of local recurrence, there is very mild swelling associated with radiation and some tightness in her left axilla that is noted. LUNGS:  Clear to auscultation bilaterally.  No wheezes or rhonchi. HEART:  Regular rate and rhythm. No murmur appreciated. ABDOMEN:  Soft, nontender.  Positive, normoactive bowel sounds. No organomegaly palpated. MSK:  No focal spinal tenderness to palpation. Full range of motion bilaterally in the upper extremities. EXTREMITIES:  No peripheral edema.   SKIN:  Clear with no obvious rashes or skin changes. No nail dyscrasia. NEURO:  Nonfocal. Well oriented.  Appropriate affect.   LABORATORY DATA:  None for this visit.  DIAGNOSTIC IMAGING:  None for this visit.      ASSESSMENT AND PLAN:  Christy [name] is a pleasant 81 y.o. female with Stage IA left breast invasive ductal carcinoma, ER+/PR+/HER2-, diagnosed in January 2023, treated with lumpectomy, adjuvant radiation therapy.  She presents to the Survivorship Clinic for our initial meeting and routine follow-up post-completion of treatment for breast cancer.    1. Stage 1A left breast cancer:  Christy [name] is continuing to recover from definitive treatment for breast cancer. She will follow-up with her medical oncologist, Dr. Burr Medico in April 2024 with history and physical exam per surveillance protocol.  Her mammogram is due January 2024; orders placed today and [facility] Today, a comprehensive survivorship care plan and treatment summary was reviewed with the patient today detailing her breast cancer diagnosis, treatment course, potential late/long-term effects of treatment,  appropriate follow-up care with recommendations for the future, and patient education resources.  A copy of this summary, along with a letter will be sent to the patient's primary care provider via mail/fax/In  Basket message after today's visit.    2.  Breast swelling and mild left shoulder tightness: I gave her an after breast cancer handout from our physical therapy team that included some shoulder range of motion exercises that she can do.  She will also continue to do yoga twice a week which will also continue to help.  If this tightness does not improve she will let me know and I will place a referral for her to go see our physical therapy team.  3. Bone health: She tells me that she has undergone bone density testing many years ago and does not wish to repeat any testing anytime soon.  She was given education on specific activities to promote bone health.  4. Cancer screening:  Due to Christy [name] history and her age, she should receive screening for skin cancers.  She tells me that she is graduated from colon cancer screening and Pap smears.  The information and recommendations are listed on the patient's comprehensive care plan/treatment summary and were reviewed in detail with the patient.    5. Health maintenance and wellness promotion: Christy [name] was encouraged to consume 5-7 servings of fruits and vegetables per day. We reviewed the "Nutrition Rainbow" handout.  She was also encouraged to engage in moderate to vigorous exercise for 30 minutes per day most days of the week. We discussed the [employer] fitness program, which is designed for cancer survivors to help them become more physically fit after cancer treatments.  She was instructed to limit her alcohol consumption and continue to abstain from tobacco use.     6. Support services/counseling: It is not uncommon for this period of the patient's cancer care trajectory to be one of many emotions and stressors.  She was given  information regarding our available services and encouraged to contact me with any questions or for help enrolling in any of our support group/programs.    Follow up instructions:    -Return to cancer center in April 2024 for follow-up with Dr. Morey Hummingbird -Mammogram due in January 2024 -Follow up with surgery in September or October 2023 -She is welcome to return back to the Survivorship Clinic at any time; no additional follow-up needed at this time.  -Consider referral back to survivorship as a long-term survivor for continued surveillance  The patient was provided an opportunity to ask questions and all were answered. The patient agreed with the plan and demonstrated an understanding of the instructions.   Total encounter time:40 minutes*in face-to-face visit time, chart review, lab review, care coordination, order entry, and documentation of the encounter time.    Wilber Bihari, NP 02/02/22 8:38 AM Medical Oncology and Hematology [facility] [place], [place] [address] Tel. [phone]    Fax. [phone]  *Total Encounter Time as defined by the Centers for Medicare and Medicaid Services includes, in addition to the face-to-face time of a patient visit (documented in the note above) non-face-to-face time: obtaining and reviewing outside history, ordering and reviewing medications, tests or procedures, care coordination (communications with other health care professionals or caregivers) and documentation in the medical record.

## 2022-02-02 NOTE — Progress Notes (Signed)
Per Wilber Bihari, NP, I faxed the pt's mammogram order to [facility].  Fax confirmation received.

## 2022-02-14 ENCOUNTER — Other Ambulatory Visit: Payer: Self-pay | Admitting: Nurse Practitioner

## 2022-03-25 ENCOUNTER — Telehealth: Payer: Self-pay | Admitting: Cardiovascular Disease

## 2022-03-25 DIAGNOSIS — C50912 Malignant neoplasm of unspecified site of left female breast: Secondary | ICD-10-CM | POA: Diagnosis not present

## 2022-03-25 NOTE — Telephone Encounter (Signed)
Patient c/o Palpitations:  High priority if patient c/o lightheadedness, shortness of breath, or chest pain  How long have you had palpitations/irregular HR/ Afib? Was in a-fib last night until about 6:30 last night.  Has been in A-fib off and on for the past week. Are you having the symptoms now? No   Are you currently experiencing lightheadedness, SOB or CP? No   Do you have a history of afib (atrial fibrillation) or irregular heart rhythm? Yes has history of A-Fib   Have you checked your BP or HR? (document readings if available): 170/130  Are you experiencing any other symptoms? She states she pass out last night at 6:30pm.

## 2022-03-25 NOTE — Telephone Encounter (Signed)
Call routed to triage, pt has history of A-fib, but states she passed out last night about 1830. She was with a group, felt her heart beating irregularly, felt dizzy (but states this is not abnormal for her), sat down and then passed out. Surrounding people state she was out "for a couple of minutes." She became aware of her surroundings and told them not to call 911. States she went home and went to bed. States her HR was around 130bpm and BP was "170-180/130." BP this morning was back to normal at 137/81. She feels fine today, just anxious about the event. Condones no missed doses of Tikosyn or Eliquis. Pt wants to know if she should do anything now/today and also wants advice on if she should change her plans to go to the [place] on the 25th of this month. Pt last seen in A-fib clinic 12/28/21 for A-fib. Will route message to them for advice.

## 2022-03-25 NOTE — Telephone Encounter (Signed)
Spoke with patient. No afib this morning but feels as though her afib burden has increased over the last few weeks. Scared to take any PRN medications when she goes into afib so she hasn't. Appt moved up to 10/10 with Roderic Palau NP but ER precautions given to patient should she have any syncopal symptoms again. Pt verbalized understanding.

## 2022-03-29 ENCOUNTER — Other Ambulatory Visit: Payer: Self-pay

## 2022-03-29 ENCOUNTER — Encounter (HOSPITAL_COMMUNITY): Payer: Self-pay | Admitting: Nurse Practitioner

## 2022-03-29 ENCOUNTER — Inpatient Hospital Stay (HOSPITAL_COMMUNITY)
Admission: RE | Admit: 2022-03-29 | Discharge: 2022-03-29 | Disposition: A | Discharge status: Home / Self Care | Payer: PPO | Source: Ambulatory Visit | Attending: Nurse Practitioner | Admitting: Nurse Practitioner

## 2022-03-29 ENCOUNTER — Ambulatory Visit (HOSPITAL_BASED_OUTPATIENT_CLINIC_OR_DEPARTMENT_OTHER)
Admission: RE | Admit: 2022-03-29 | Discharge: 2022-03-29 | Disposition: A | Discharge status: Home / Self Care | Payer: PPO | Source: Ambulatory Visit | Attending: Nurse Practitioner | Admitting: Nurse Practitioner

## 2022-03-29 VITALS — BP 122/56 | HR 57 | Ht 67.0 in | Wt 148.6 lb

## 2022-03-29 DIAGNOSIS — Z888 Allergy status to other drugs, medicaments and biological substances status: Secondary | ICD-10-CM | POA: Diagnosis not present

## 2022-03-29 DIAGNOSIS — Z86711 Personal history of pulmonary embolism: Secondary | ICD-10-CM | POA: Insufficient documentation

## 2022-03-29 DIAGNOSIS — Z8619 Personal history of other infectious and parasitic diseases: Secondary | ICD-10-CM | POA: Diagnosis not present

## 2022-03-29 DIAGNOSIS — Z8616 Personal history of COVID-19: Secondary | ICD-10-CM | POA: Insufficient documentation

## 2022-03-29 DIAGNOSIS — I83893 Varicose veins of bilateral lower extremities with other complications: Secondary | ICD-10-CM | POA: Diagnosis present

## 2022-03-29 DIAGNOSIS — I480 Paroxysmal atrial fibrillation: Secondary | ICD-10-CM | POA: Diagnosis present

## 2022-03-29 DIAGNOSIS — Z79899 Other long term (current) drug therapy: Secondary | ICD-10-CM | POA: Diagnosis not present

## 2022-03-29 DIAGNOSIS — Z8249 Family history of ischemic heart disease and other diseases of the circulatory system: Secondary | ICD-10-CM | POA: Diagnosis not present

## 2022-03-29 DIAGNOSIS — I083 Combined rheumatic disorders of mitral, aortic and tricuspid valves: Secondary | ICD-10-CM | POA: Diagnosis present

## 2022-03-29 DIAGNOSIS — I4719 Other supraventricular tachycardia: Secondary | ICD-10-CM | POA: Diagnosis present

## 2022-03-29 DIAGNOSIS — I10 Essential (primary) hypertension: Secondary | ICD-10-CM | POA: Diagnosis present

## 2022-03-29 DIAGNOSIS — I495 Sick sinus syndrome: Secondary | ICD-10-CM | POA: Diagnosis present

## 2022-03-29 DIAGNOSIS — R55 Syncope and collapse: Secondary | ICD-10-CM

## 2022-03-29 DIAGNOSIS — I4891 Unspecified atrial fibrillation: Secondary | ICD-10-CM | POA: Diagnosis not present

## 2022-03-29 DIAGNOSIS — M8580 Other specified disorders of bone density and structure, unspecified site: Secondary | ICD-10-CM | POA: Diagnosis present

## 2022-03-29 DIAGNOSIS — Z9851 Tubal ligation status: Secondary | ICD-10-CM | POA: Diagnosis not present

## 2022-03-29 DIAGNOSIS — I4892 Unspecified atrial flutter: Secondary | ICD-10-CM | POA: Diagnosis present

## 2022-03-29 DIAGNOSIS — Z86718 Personal history of other venous thrombosis and embolism: Secondary | ICD-10-CM | POA: Insufficient documentation

## 2022-03-29 DIAGNOSIS — I878 Other specified disorders of veins: Secondary | ICD-10-CM | POA: Diagnosis present

## 2022-03-29 DIAGNOSIS — Z950 Presence of cardiac pacemaker: Secondary | ICD-10-CM | POA: Diagnosis not present

## 2022-03-29 DIAGNOSIS — E559 Vitamin D deficiency, unspecified: Secondary | ICD-10-CM | POA: Diagnosis present

## 2022-03-29 DIAGNOSIS — Z7901 Long term (current) use of anticoagulants: Secondary | ICD-10-CM | POA: Insufficient documentation

## 2022-03-29 DIAGNOSIS — K219 Gastro-esophageal reflux disease without esophagitis: Secondary | ICD-10-CM | POA: Diagnosis present

## 2022-03-29 DIAGNOSIS — E785 Hyperlipidemia, unspecified: Secondary | ICD-10-CM | POA: Diagnosis present

## 2022-03-29 DIAGNOSIS — D6869 Other thrombophilia: Secondary | ICD-10-CM | POA: Diagnosis not present

## 2022-03-29 DIAGNOSIS — F419 Anxiety disorder, unspecified: Secondary | ICD-10-CM | POA: Diagnosis present

## 2022-03-29 DIAGNOSIS — R9431 Abnormal electrocardiogram [ECG] [EKG]: Secondary | ICD-10-CM | POA: Diagnosis not present

## 2022-03-29 DIAGNOSIS — Z91048 Other nonmedicinal substance allergy status: Secondary | ICD-10-CM | POA: Diagnosis not present

## 2022-03-29 DIAGNOSIS — Z853 Personal history of malignant neoplasm of breast: Secondary | ICD-10-CM | POA: Diagnosis not present

## 2022-03-29 DIAGNOSIS — I700 Atherosclerosis of aorta: Secondary | ICD-10-CM | POA: Diagnosis not present

## 2022-03-29 LAB — CBC
HCT: 39.2 % (ref 36.0–46.0)
Hemoglobin: 12.8 g/dL (ref 12.0–15.0)
MCH: 31.6 pg (ref 26.0–34.0)
MCHC: 32.7 g/dL (ref 30.0–36.0)
MCV: 96.8 fL (ref 80.0–100.0)
Platelets: 214 10*3/uL (ref 150–400)
RBC: 4.05 MIL/uL (ref 3.87–5.11)
RDW: 13.2 % (ref 11.5–15.5)
WBC: 5.5 10*3/uL (ref 4.0–10.5)
nRBC: 0 % (ref 0.0–0.2)

## 2022-03-29 LAB — MAGNESIUM: Magnesium: 2.1 mg/dL (ref 1.7–2.4)

## 2022-03-29 LAB — BASIC METABOLIC PANEL
Anion gap: 7 (ref 5–15)
BUN: 23 mg/dL (ref 8–23)
CO2: 26 mmol/L (ref 22–32)
Calcium: 9.3 mg/dL (ref 8.9–10.3)
Chloride: 105 mmol/L (ref 98–111)
Creatinine, Ser: 0.89 mg/dL (ref 0.44–1.00)
GFR, Estimated: >60 mL/min (ref >60)
Glucose, Bld: 129 mg/dL — ABNORMAL HIGH (ref 70–99)
Potassium: 4.3 mmol/L (ref 3.5–5.1)
Sodium: 138 mmol/L (ref 135–145)

## 2022-03-29 NOTE — Progress Notes (Addendum)
Primary Care Physician: Wenda Low, MD Referring Physician: Dr. Caprice Renshaw Christy [name] is a 81 y.o. female with a h/o atrial fib and PE/DVT 01/2016. She is has been treated with Rythmol and Tikosyn in the past. When her burden increased on Tikosyn, it was decided to pursue ablation 02/26/17.   She is in the afib clinic today, 04/10/19, for f/u of Tikosyn past ablation. She feels good. She has been staying in [place]. She has a linq, has intermittent palpitations but per Dr. Jackalyn Lombard note disorganized atrial activity, but not afib. She is leaving for [place] in Feb/March,2021 for a couple of months. Continues on eliquis 5 mg bid for a CHA2DS2VASc score of 5, no bleeding issues.  F/u in afib clinic, 07/18/19. She reports that she had Covid 19 the earlier part of the month while she and her husband were at the beach. They believe the exposure was from  2 furniture  delivery men  that were in their house for awhile, as they did not come into contact with other people and did not eat in restaurants.  She  and her husband both came down with it and received the antibody infusion at [facility] hospital. She overall felt better after that and she feels their symptoms were fairly mild. She did not have any afib while she was sick with covid. Continues on tikosyn and eliquis with a CHA2DS2VASc score of 5.  F/u in afib clinic, 08/27/19, pt called to office saying that she is seeing HR's by BP cuff in the 40's at home even though she feels well. We ran a Link report that shows PAC's as well as episodes of afib that usually  last around mins to one hour. Pt does not feel these episodes. Her longest afib episode was 50 mins on 2/26 and 2/27 with v rates 120-150, again pt did not feel this. EKG today shows SR with pac's. She feels well today.  F/u in afib clinic, 08/27/20. Afib has been quiet recently. She saw Dr. Rayann Heman 06/01/21 and by his assessment then, he did not feel she was a repeat ablation candidate. She continues on  tikosyn. No bleeding issues with eliquis with a CHA2DS2VASc score of 5.   F/u in afib clinic, 03/03/21. She remains on Tikosyn. One episode of afib that lasted around 24 hours and resolved on its own. She has an apple watch now so we discussed how she can track this as well as with her Linq. Last Linq report in September did not show any arrhythmia. Continues on eliquis 5 mg bid for a CHA2DS2VASc  score of 5.  F/u in afib clinic, 10/13/21  for Tikosyn surveillance. Qt is stable. Only one episode of Afib lasting less than 8 hours. Remain complaint on Tikosyn.  She was dx with  early L breast CA in February, treated with lumpectomy and  will be finishing radiation soon. Has had a congested sough x 2 weeks. PCP has evaluated.   F/u in afib clinic, 12/28/21. She developed afib on Saturday. She has been complaint with Tikosyn and anticoagulation. No known triggers. She had her Linq explanted in May. She was in her garage this am and became lightheaded for a few seconds. Afther that she felt like she was back in [place]. EKG today confirms this. She has some stress recently which may have contributed to the afib.   F/u in afib clinic 10/10 for Tikosyn surveillance but also reports that she has a syncopal episode this past Thursday  night after drinking around 3/4 glass of wine. She may not have eaten that much that day. Her husband saw her fall, she was out for less than one minute.she was out of rhythm when she regained consciousness.  She states she did not hit her head. She did not seek any type of medial attention.  She is also describing episodes of feeling dizzy  for just a few seconds at a time.no further syncope. While running ekg today,she went from sinus brady at 57 bpm to accelerated  junctional at 122 bpm for just a few minutes. With the faster rate she felt dizzy.    Today, she denies symptoms of palpitations, chest pain, shortness of breath, orthopnea, PND, lower extremity edema, dizziness, presyncope,  syncope, or neurologic sequela. The patient is tolerating medications without difficulties and is otherwise without complaint today.   Past Medical History:  Diagnosis Date   Anxiety    Atrial fibrillation ([place])    Bilateral lower extremity edema    Chronic, likely related to venous stasis    DJD (degenerative joint disease), lumbosacral    Dyslipidemia    Dysrhythmia    A fib   Essential hypertension    "dx'd recently" (10/06/2016)   GERD (gastroesophageal reflux disease)    History of hepatitis C virus infection ~ 1997   In remission   Lung granuloma ([place]) 2004   CT scan   Mitral regurgitation    04/30/21 echo: bileaflet prolapse with moderate MR   Osteopenia    Paroxysmal atrial tachycardia ([facility])    PE (pulmonary embolism) 01/2016   PONV (postoperative nausea and vomiting)    Patient states she had extremem nausea after neck surgery several years ago   Spondylolisthesis    Status post epidural injections 3 by neurosurgery  - Dr. Joya Salm    Varicose veins of both legs with edema    Vitamin D deficiency    Past Surgical History:  Procedure Laterality Date   ATRIAL FIBRILLATION ABLATION N/A 03/09/2017   Procedure: Atrial Fibrillation Ablation;  Surgeon: [name], MD;  Location: [facility];  Service: Cardiovascular;  Laterality: N/A;   BACK SURGERY     BREAST LUMPECTOMY WITH RADIOACTIVE SEED LOCALIZATION Left 08/12/2021   Procedure: LEFT BREAST LUMPECTOMY WITH RADIOACTIVE SEED LOCALIZATION;  Surgeon: Donnie Mesa, MD;  Location: [facility];  Service: General;  Laterality: Left;   CARDIOVERSION N/A 03/21/2016   Procedure: CARDIOVERSION;  Surgeon: [name], MD;  Location: [facility];  Service: Cardiovascular;  Laterality: N/A;   COMBINED HYSTEROSCOPY DIAGNOSTIC / D&C  2005   COMBINED HYSTEROSCOPY DIAGNOSTIC / D&C  1999   DILATION AND CURETTAGE OF UTERUS     implantable loop recorder removal  11/10/2021   LOOP RECORDER INSERTION N/A 10/31/2017   Procedure:  LOOP RECORDER INSERTION;  Surgeon: [name], MD;  Location: [facility];  Service: Cardiovascular;  Laterality: N/A;   [place]   "went in front and back; broken neck"   RE-EXCISION OF BREAST CANCER,SUPERIOR MARGINS Left 08/25/2021   Procedure: RE-EXCISION OF SUPERIOR MARGIN -  LEFT BREAST LUMPECTOMY;  Surgeon: Donnie Mesa, MD;  Location: [facility];  Service: General;  Laterality: Left;   TEE WITHOUT CARDIOVERSION N/A 03/08/2017   Procedure: TRANSESOPHAGEAL ECHOCARDIOGRAM (TEE);  Surgeon: Josue Hector, MD;  Location: [facility];  Service: Cardiovascular;  Laterality: N/A;   TOE SURGERY Right    replaced joint   TONSILLECTOMY     TUBAL LIGATION Bilateral 1976  VAGINAL DELIVERY     x2    Current Outpatient Medications  Medication Sig Dispense Refill   apixaban (ELIQUIS) 5 MG TABS tablet TAKE 1 TABLET BY MOUTH TWICE DAILY(START 02/03/2016) 180 tablet 3   betamethasone valerate (VALISONE) 0.1 % cream Apply 1 application topically 2 (two) times daily as needed (itching.).     Cephalexin 500 MG tablet Take 500 mg by mouth 2 (two) times daily. Takes as needed for UTI's     cholecalciferol (VITAMIN D3) 25 MCG (1000 UNIT) tablet Take 1,000 Units by mouth in the morning.     dofetilide (TIKOSYN) 250 MCG capsule TAKE 1 CAPSULE BY MOUTH TWICE DAILY IN THE MORNING AND DINNER FOR HEART 180 capsule 1   furosemide (LASIX) 20 MG tablet Take 1 tablet (20 mg total) by mouth daily as needed for fluid or edema (weight gain of 3 lbs in 24 hrs or 5 lbs in 1 week). 30 tablet 3   loratadine (CLARITIN) 10 MG tablet Take 10 mg by mouth in the morning.     losartan (COZAAR) 25 MG tablet Take 25 mg by mouth in the morning.     Magnesium 250 MG TABS Take 250 mg by mouth at bedtime.     Multiple Vitamin (MULTIVITAMIN WITH MINERALS) TABS tablet Take 1 tablet by mouth in the morning.     pantoprazole (PROTONIX) 40 MG tablet Take 40 mg by mouth 2 (two) times daily.     potassium chloride  (KLOR-CON) 10 MEQ tablet TAKE 1 TABLET(10 MEQ) BY MOUTH DAILY 30 tablet 6   Probiotic Product (PROBIOTIC DAILY PO) Take 1 tablet by mouth every morning. May take 2 tablets as needed     propranolol (INDERAL) 20 MG tablet Take 1 tablet (20 mg total) by mouth 2 (two) times daily. 180 tablet 3   Ascorbic Acid (VITAMIN C) 1000 MG tablet Take 1,000 mg by mouth in the morning. (Patient not taking: Reported on 03/29/2022)     No current facility-administered medications for this encounter.    Allergies  Allergen Reactions   Caffeine     Heart palpitations   Epinephrine Palpitations    Social History   Socioeconomic History   Marital status: Married    Spouse name: Not on file   Number of children: 2   Years of education: Not on file   Highest education level: Not on file  Occupational History   Not on file  Tobacco Use   Smoking status: Never   Smokeless tobacco: Never  Vaping Use   Vaping Use: Never used  Substance and Sexual Activity   Alcohol use: Yes    Alcohol/week: 14.0 standard drinks of alcohol    Types: 14 Glasses of wine per week    Comment: occasional   Drug use: No   Sexual activity: Yes    Birth control/protection: Other-see comments    Comment: tubal ligation  Other Topics Concern   Not on file  Social History Narrative   She is a married mother of 1. Lives with her husband. 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 She has never smoked and drinks up to 10 glasses of wine or so week.   She usually exercises roughly 3 days a week doing yoga and plays golf. She is mostly limited by her "time constraints "   Social Determinants of Radio broadcast assistant Strain: Not on file  Food Insecurity: Not on file  Transportation Needs: Not on file  Physical Activity:  Not on file  Stress: Not on file  Social Connections: Not on file  Intimate Partner Violence: Not on file    Family History  Problem Relation Age of Onset   Heart Problems Mother    CVA  Mother    Heart attack Mother 84   CVA Father    Colon cancer Neg Hx    Clotting disorder Neg Hx     ROS- All systems are reviewed and negative except as per the HPI above  Physical Exam: Vitals:   03/29/22 1025  BP: (!) 122/56  Pulse: (!) 57  Weight: 67.4 kg  Height: '5\' 7"'$  (1.702 m)    Wt Readings from Last 3 Encounters:  03/29/22 67.4 kg  02/02/22 71.3 kg  12/28/21 70.2 kg    Labs: Lab Results  Component Value Date   NA 138 03/29/2022   K 4.3 03/29/2022   CL 105 03/29/2022   CO2 26 03/29/2022   GLUCOSE 129 (H) 03/29/2022   BUN 23 03/29/2022   CREATININE 0.89 03/29/2022   CALCIUM 9.3 03/29/2022   MG 2.1 03/29/2022   Lab Results  Component Value Date   INR 1.1 03/17/2016   No results found for: "CHOL", "HDL", "LDLCALC", "TRIG"   GEN- The patient is well appearing, alert and oriented x 3 today.   Head- normocephalic, atraumatic Eyes-  Sclera clear, conjunctiva pink Ears- hearing intact Oropharynx- clear Neck- supple, no JVP Lymph- no cervical lymphadenopathy Lungs- Clear to ausculation bilaterally, normal work of breathing Heart- Regular rate and rhythm, no murmurs, rubs or gallops, PMI not laterally displaced GI- soft, NT, ND, + BS Extremities- no clubbing, cyanosis, or edema MS- no significant deformity or atrophy Skin- no rash or lesion Psych- euthymic mood, full affect Neuro- strength and sensation are intact  EKG-  Vent. rate 57 BPM PR interval 288 ms QRS duration 78 ms QT/QTcB 454/441 ms P-R-T axes 78 31 49 Sinus bradycardia with 1st degree A-V block Otherwise normal ECG When compared with ECG of 28-Dec-2021 10:48, PREVIOUS ECG IS PRESENT    Assessment and Plan: 1. Paroxysmal atrial fibrillation S/p ablation 02/2017  States increase in afib burden, monitor placed   Continue dofetilide 250 mcg bid/BB without change Continue eliquis 5 mg bid with chadsvasc score of 5 Bmet/mag/cbc  2. Syncope Will place a 2 week LIVE Zio patch  No  driving If happens again go to ED   3. HTN Stable  Have requested an appointment to establish with Dr. Myles Gip in 2 weeks in lieu of Dr. Jackalyn Lombard absence   Addendum- live Elwyn Reach reported this am that pt had a 8 second pause at 5:08 am. She was awake and became very symptomatic. I spoke to EP and she will report to the ER for admission and probable  PPM.  Pleas see Juluis Mire phone note for strip documentation   Geroge Baseman. Yahir Tavano, [facility] [address] [place], [place] [address] [phone]

## 2022-03-30 DIAGNOSIS — R55 Syncope and collapse: Secondary | ICD-10-CM | POA: Diagnosis not present

## 2022-03-31 ENCOUNTER — Other Ambulatory Visit: Payer: Self-pay

## 2022-03-31 ENCOUNTER — Inpatient Hospital Stay (HOSPITAL_COMMUNITY): Payer: PPO

## 2022-03-31 ENCOUNTER — Inpatient Hospital Stay (HOSPITAL_COMMUNITY)
Admission: EM | Admit: 2022-03-31 | Discharge: 2022-04-02 | DRG: 243 | Disposition: A | Discharge status: Home / Self Care | Payer: PPO | Attending: Cardiology | Admitting: Cardiology

## 2022-03-31 ENCOUNTER — Encounter (HOSPITAL_COMMUNITY): Payer: Self-pay | Admitting: Nurse Practitioner

## 2022-03-31 ENCOUNTER — Telehealth (HOSPITAL_COMMUNITY): Payer: Self-pay | Admitting: *Deleted

## 2022-03-31 ENCOUNTER — Encounter (HOSPITAL_COMMUNITY): Payer: Self-pay | Admitting: Cardiology

## 2022-03-31 DIAGNOSIS — I878 Other specified disorders of veins: Secondary | ICD-10-CM | POA: Diagnosis present

## 2022-03-31 DIAGNOSIS — Z950 Presence of cardiac pacemaker: Secondary | ICD-10-CM | POA: Diagnosis not present

## 2022-03-31 DIAGNOSIS — E785 Hyperlipidemia, unspecified: Secondary | ICD-10-CM | POA: Diagnosis present

## 2022-03-31 DIAGNOSIS — Z853 Personal history of malignant neoplasm of breast: Secondary | ICD-10-CM | POA: Diagnosis not present

## 2022-03-31 DIAGNOSIS — I10 Essential (primary) hypertension: Secondary | ICD-10-CM | POA: Diagnosis present

## 2022-03-31 DIAGNOSIS — Z7901 Long term (current) use of anticoagulants: Secondary | ICD-10-CM | POA: Diagnosis not present

## 2022-03-31 DIAGNOSIS — Z91048 Other nonmedicinal substance allergy status: Secondary | ICD-10-CM | POA: Diagnosis not present

## 2022-03-31 DIAGNOSIS — Z8619 Personal history of other infectious and parasitic diseases: Secondary | ICD-10-CM

## 2022-03-31 DIAGNOSIS — R9431 Abnormal electrocardiogram [ECG] [EKG]: Secondary | ICD-10-CM | POA: Diagnosis not present

## 2022-03-31 DIAGNOSIS — Z9851 Tubal ligation status: Secondary | ICD-10-CM

## 2022-03-31 DIAGNOSIS — M8580 Other specified disorders of bone density and structure, unspecified site: Secondary | ICD-10-CM | POA: Diagnosis present

## 2022-03-31 DIAGNOSIS — I4892 Unspecified atrial flutter: Secondary | ICD-10-CM | POA: Diagnosis present

## 2022-03-31 DIAGNOSIS — K219 Gastro-esophageal reflux disease without esophagitis: Secondary | ICD-10-CM | POA: Diagnosis present

## 2022-03-31 DIAGNOSIS — I083 Combined rheumatic disorders of mitral, aortic and tricuspid valves: Secondary | ICD-10-CM | POA: Diagnosis present

## 2022-03-31 DIAGNOSIS — Z888 Allergy status to other drugs, medicaments and biological substances status: Secondary | ICD-10-CM | POA: Diagnosis not present

## 2022-03-31 DIAGNOSIS — I83893 Varicose veins of bilateral lower extremities with other complications: Secondary | ICD-10-CM | POA: Diagnosis present

## 2022-03-31 DIAGNOSIS — Z8249 Family history of ischemic heart disease and other diseases of the circulatory system: Secondary | ICD-10-CM

## 2022-03-31 DIAGNOSIS — R55 Syncope and collapse: Secondary | ICD-10-CM | POA: Diagnosis present

## 2022-03-31 DIAGNOSIS — I4719 Other supraventricular tachycardia: Secondary | ICD-10-CM | POA: Diagnosis present

## 2022-03-31 DIAGNOSIS — Z79899 Other long term (current) drug therapy: Secondary | ICD-10-CM

## 2022-03-31 DIAGNOSIS — I480 Paroxysmal atrial fibrillation: Secondary | ICD-10-CM | POA: Diagnosis present

## 2022-03-31 DIAGNOSIS — Z86711 Personal history of pulmonary embolism: Secondary | ICD-10-CM | POA: Diagnosis not present

## 2022-03-31 DIAGNOSIS — I495 Sick sinus syndrome: Principal | ICD-10-CM | POA: Diagnosis present

## 2022-03-31 DIAGNOSIS — E559 Vitamin D deficiency, unspecified: Secondary | ICD-10-CM | POA: Diagnosis present

## 2022-03-31 DIAGNOSIS — F419 Anxiety disorder, unspecified: Secondary | ICD-10-CM | POA: Diagnosis present

## 2022-03-31 DIAGNOSIS — R001 Bradycardia, unspecified: Secondary | ICD-10-CM | POA: Diagnosis present

## 2022-03-31 LAB — CBC WITH DIFFERENTIAL/PLATELET
Abs Immature Granulocytes: 0.03 10*3/uL (ref 0.00–0.07)
Basophils Absolute: 0 10*3/uL (ref 0.0–0.1)
Basophils Relative: 0 %
Eosinophils Absolute: 0.1 10*3/uL (ref 0.0–0.5)
Eosinophils Relative: 1 %
HCT: 39.2 % (ref 36.0–46.0)
Hemoglobin: 13.3 g/dL (ref 12.0–15.0)
Immature Granulocytes: 1 %
Lymphocytes Relative: 16 %
Lymphs Abs: 0.7 10*3/uL (ref 0.7–4.0)
MCH: 32.6 pg (ref 26.0–34.0)
MCHC: 33.9 g/dL (ref 30.0–36.0)
MCV: 96.1 fL (ref 80.0–100.0)
Monocytes Absolute: 0.4 10*3/uL (ref 0.1–1.0)
Monocytes Relative: 9 %
Neutro Abs: 3.4 10*3/uL (ref 1.7–7.7)
Neutrophils Relative %: 73 %
Platelets: 208 10*3/uL (ref 150–400)
RBC: 4.08 MIL/uL (ref 3.87–5.11)
RDW: 13 % (ref 11.5–15.5)
WBC: 4.6 10*3/uL (ref 4.0–10.5)
nRBC: 0 % (ref 0.0–0.2)

## 2022-03-31 LAB — BASIC METABOLIC PANEL
Anion gap: 6 (ref 5–15)
BUN: 19 mg/dL (ref 8–23)
CO2: 26 mmol/L (ref 22–32)
Calcium: 9.7 mg/dL (ref 8.9–10.3)
Chloride: 106 mmol/L (ref 98–111)
Creatinine, Ser: 0.93 mg/dL (ref 0.44–1.00)
GFR, Estimated: >60 mL/min (ref >60)
Glucose, Bld: 137 mg/dL — ABNORMAL HIGH (ref 70–99)
Potassium: 4.2 mmol/L (ref 3.5–5.1)
Sodium: 138 mmol/L (ref 135–145)

## 2022-03-31 LAB — SURGICAL PCR SCREEN
MRSA, PCR: NEGATIVE
Staphylococcus aureus: NEGATIVE

## 2022-03-31 MED ORDER — LORATADINE 10 MG PO TABS
10.0000 mg | ORAL_TABLET | Freq: Every morning | ORAL | Status: DC
  Administered 2022-04-01 – 2022-04-02 (×2): 10 mg via ORAL
  Filled 2022-03-31 (×2): qty 1

## 2022-03-31 MED ORDER — CHLORHEXIDINE GLUCONATE 4 % EX LIQD
60.0000 mL | Freq: Once | CUTANEOUS | Status: AC
  Administered 2022-03-31: 4 via TOPICAL
  Filled 2022-03-31: qty 60

## 2022-03-31 MED ORDER — PANTOPRAZOLE SODIUM 40 MG PO TBEC
40.0000 mg | DELAYED_RELEASE_TABLET | Freq: Two times a day (BID) | ORAL | Status: DC
  Administered 2022-03-31 – 2022-04-02 (×4): 40 mg via ORAL
  Filled 2022-03-31 (×5): qty 1

## 2022-03-31 MED ORDER — DOFETILIDE 250 MCG PO CAPS: 250.0000 ug | ORAL_CAPSULE | Freq: Two times a day (BID) | ORAL | Status: DC

## 2022-03-31 MED ORDER — LOSARTAN POTASSIUM 25 MG PO TABS
25.0000 mg | ORAL_TABLET | Freq: Every morning | ORAL | Status: DC
  Administered 2022-04-01 – 2022-04-02 (×2): 25 mg via ORAL
  Filled 2022-03-31 (×2): qty 1

## 2022-03-31 MED ORDER — ACETAMINOPHEN 325 MG PO TABS
650.0000 mg | ORAL_TABLET | ORAL | Status: DC | PRN
  Administered 2022-04-01 (×2): 650 mg via ORAL
  Filled 2022-03-31 (×2): qty 2

## 2022-03-31 MED ORDER — SODIUM CHLORIDE 0.9% FLUSH: 3.0000 mL | INTRAVENOUS | Status: DC | PRN

## 2022-03-31 MED ORDER — SODIUM CHLORIDE 0.9 % IV SOLN
80.0000 mg | INTRAVENOUS | Status: AC
  Administered 2022-04-01: 80 mg
  Filled 2022-03-31: qty 2

## 2022-03-31 MED ORDER — CHLORHEXIDINE GLUCONATE 4 % EX LIQD
60.0000 mL | Freq: Once | CUTANEOUS | Status: AC
  Administered 2022-04-01: 4 via TOPICAL
  Filled 2022-03-31: qty 60

## 2022-03-31 MED ORDER — SODIUM CHLORIDE 0.9 % IV SOLN: 250.0000 mL | INTRAVENOUS | Status: DC

## 2022-03-31 MED ORDER — SODIUM CHLORIDE 0.9 % IV SOLN: INTRAVENOUS | Status: DC

## 2022-03-31 MED ORDER — DOFETILIDE 250 MCG PO CAPS
250.0000 ug | ORAL_CAPSULE | Freq: Two times a day (BID) | ORAL | Status: DC
  Administered 2022-03-31 – 2022-04-02 (×4): 250 ug via ORAL
  Filled 2022-03-31 (×5): qty 1

## 2022-03-31 MED ORDER — SODIUM CHLORIDE 0.9% FLUSH
3.0000 mL | Freq: Two times a day (BID) | INTRAVENOUS | Status: DC
  Administered 2022-03-31 – 2022-04-01 (×4): 3 mL via INTRAVENOUS

## 2022-03-31 MED ORDER — CEFAZOLIN SODIUM-DEXTROSE 2-4 GM/100ML-% IV SOLN
2.0000 g | INTRAVENOUS | Status: AC
  Administered 2022-04-01: 2 g via INTRAVENOUS

## 2022-03-31 NOTE — ED Triage Notes (Signed)
Pt. Stated, I had a fainting spell and I have a monitor on and it shows I need a Psychologist, forensic. Told to come here.

## 2022-03-31 NOTE — ED Notes (Signed)
Echo complete

## 2022-03-31 NOTE — ED Notes (Signed)
Informed consent completed at bedside with patient and this RN.

## 2022-03-31 NOTE — ED Provider Notes (Signed)
[facility] EMERGENCY DEPARTMENT Provider Note   CSN: [id number] Arrival date & time: 03/31/22  8338     History  Chief Complaint  Patient presents with   Near Syncope   Atrial Fibrillation   Irregular Heart Beat    Christy [name] is a 81 y.o. female.  With history as listed below who presents after recommendation from her cardiologist for pacemaker implant due to 8-second pause noted on her cardiac monitor today.  Patient says since 2017 she has had intermittent episodes of lightheadedness and presyncopal episodes with no inciting factors but this past Thursday while walking out of the house from a party she had a syncopal episode.  She did not hit her head.  She had no loss of bladder or bowels.  She was called by a friend from preventing her from hitting her head.  Since then she has been on a heart monitor and was called in today due to an 8-second stop.  She has history of paroxysmal A-fib on Eliquis.  She has had previous echocardiograms which showed no abnormal findings per patient.  She is currently asymptomatic no chest pain, no shortness of breath, no dizziness or lightheadedness.   Near Syncope  Atrial Fibrillation       Home Medications Prior to Admission medications   Medication Sig Start Date End Date Taking? Authorizing Provider  apixaban (ELIQUIS) 5 MG TABS tablet TAKE 1 TABLET BY MOUTH TWICE DAILY(START 02/03/2016) Patient taking differently: Take 5 mg by mouth 2 (two) times daily. 06/07/17  Yes Allred, Jeneen Rinks, MD  betamethasone valerate (VALISONE) 0.1 % cream Apply 1 application topically 2 (two) times daily as needed (itching.). 03/14/19  Yes [provider]  cholecalciferol (VITAMIN D3) 25 MCG (1000 UNIT) tablet Take 1,000 Units by mouth in the morning.   Yes [provider]  dofetilide (TIKOSYN) 250 MCG capsule TAKE 1 CAPSULE BY MOUTH TWICE DAILY IN THE MORNING AND DINNER FOR HEART Patient taking differently: Take 250 mcg by  mouth 2 (two) times daily. 02/14/22  Yes Sherran Needs, NP  furosemide (LASIX) 20 MG tablet Take 1 tablet (20 mg total) by mouth daily as needed for fluid or edema (weight gain of 3 lbs in 24 hrs or 5 lbs in 1 week). 06/08/16  Yes [name], MD  loratadine (CLARITIN) 10 MG tablet Take 10 mg by mouth in the morning.   Yes [provider]  losartan (COZAAR) 25 MG tablet Take 25 mg by mouth in the morning. 07/16/20  Yes [provider]  Magnesium 250 MG TABS Take 250 mg by mouth at bedtime.   Yes [provider]  pantoprazole (PROTONIX) 40 MG tablet Take 40 mg by mouth 2 (two) times daily.   Yes [provider]  potassium chloride (KLOR-CON) 10 MEQ tablet TAKE 1 TABLET(10 MEQ) BY MOUTH DAILY Patient taking differently: Take 10 mEq by mouth once. 12/28/21  Yes Sherran Needs, NP  Probiotic Product (PROBIOTIC DAILY PO) Take 1 tablet by mouth every morning. May take 2 tablets as needed   Yes [provider]  propranolol (INDERAL) 20 MG tablet Take 1 tablet (20 mg total) by mouth 2 (two) times daily. 06/07/17  Yes Allred, Jeneen Rinks, MD      Allergies    Caffeine and Epinephrine    Review of Systems   Review of Systems  Cardiovascular:  Positive for near-syncope.    Physical Exam Updated Vital Signs BP (!) 153/74   Pulse Marland Kitchen)  55   Temp 97.6 F (36.4 C) (Oral)   Resp 12   Ht 5' 7.5" (1.715 m)   Wt 66.2 kg   SpO2 100%   BMI 22.53 kg/m  Physical Exam Constitutional: Alert and oriented. Well appearing and in no distress. Eyes: Conjunctivae are normal. ENT      Head: Normocephalic and atraumatic.      Nose: No congestion.      Mouth/Throat: Mucous membranes are moist.      Neck: No stridor. Cardiovascular: S1, S2, warm and well perfused, cardiac monitor in place on chest wall Respiratory: Normal respiratory effort. Breath sounds are normal. Gastrointestinal: Soft and nontender. Musculoskeletal: Normal range of motion in all  extremities.      Right lower leg: No tenderness or edema.      Left lower leg: No tenderness or edema. Neurologic: Normal speech and language. No gross focal neurologic deficits are appreciated. Skin: Skin is warm, dry and intact. No rash noted. Psychiatric: Mood and affect are normal. Speech and behavior are normal.  ED Results / Procedures / Treatments   Labs (all labs ordered are listed, but only abnormal results are displayed) Labs Reviewed  BASIC METABOLIC PANEL - Abnormal; Notable for the following components:      Result Value   Glucose, Bld 137 (*)    All other components within normal limits  CBC WITH DIFFERENTIAL/PLATELET    EKG EKG Interpretation  Date/Time:  Thursday March 31 2022 09:46:58 EDT Ventricular Rate:  61 PR Interval:  280 QRS Duration: 78 QT Interval:  456 QTC Calculation: 459 R Axis:   28 Text Interpretation: Sinus rhythm with 1st degree A-V block Otherwise normal ECG When compared with ECG of 29-Mar-2022 11:23, PREVIOUS ECG IS PRESENT Confirmed by Georgina Snell [phone]) on 03/31/2022 1:50:34 PM  Radiology No results found.  Procedures Procedures    Medications Ordered in ED Medications  acetaminophen (TYLENOL) tablet 650 mg (has no administration in time range)  losartan (COZAAR) tablet 25 mg (has no administration in time range)  pantoprazole (PROTONIX) EC tablet 40 mg (40 mg Oral Patient Refused/Not Given 03/31/22 1235)  loratadine (CLARITIN) tablet 10 mg (has no administration in time range)  dofetilide (TIKOSYN) capsule 250 mcg (has no administration in time range)  sodium chloride flush (NS) 0.9 % injection 3 mL (3 mLs Intravenous Given 03/31/22 1236)    ED Course/ Medical Decision Making/ A&P                           Medical Decision Making Christy [name] is a 81 y.o. female.  With history as listed below who presents after recommendation from her cardiologist for pacemaker implant due to 8-second pause noted on her cardiac  monitor today.   Patient with symptomatic tachybradycardia syndrome.    EKG obtained and I personally reviewed sinus rhythm with first-degree block.  No acute ST/T changes indicating ischemia.  Currently being evaluated by cardiology and followed on Holter monitor.  Advised to come in today for 8-second pause for pacemaker placement.  Her labs overall are unremarkable normal hemoglobin 13.3 normal white blood cell count 4.6 and no infectious concerns.  Potassium 4.2.  Sodium 138.  Glucose 137.  She is asymptomatic.  We will keep on continued cardiac monitoring and discussed case with cardiology who will put in orders for admission for plans for pacemaker placement.  Risk Decision regarding hospitalization.    Final Clinical Impression(s) / ED Diagnoses Final  diagnoses:  Syncope, unspecified syncope type    Rx / DC Orders ED Discharge Orders     None         Elgie Congo, MD 03/31/22 1352

## 2022-03-31 NOTE — Addendum Note (Signed)
Encounter addended by: Sherran Needs, NP on: 03/31/2022 9:50 AM  Actions taken: Clinical Note Signed

## 2022-03-31 NOTE — ED Provider Triage Note (Signed)
Emergency Medicine Provider Triage Evaluation Note  Christy [name] , a 81 y.o. female  was evaluated in triage.  Pt sent in by cardiologist for pacemaker.  Patient has underlying history of A-fib has had increased A-fib burden.  Was seen in cardiology office 2 days ago and had a monitor placed.  Was called this morning after she had 2 prolonged pauses during which time she had near syncopal symptoms but did not have complete syncopal event.  Sent in for placement of pacemaker.  Currently patient denies any symptoms, no palpitations, syncope or near syncope, chest pain or shortness of breath..  Review of Systems  Positive: See above Negative: See above  Physical Exam  BP 107/68 (BP Location: Right Arm)   Pulse 61   Temp 97.6 F (36.4 C) (Oral)   Resp 16   Ht 5' 7.5" (1.715 m)   Wt 66.2 kg   SpO2 97%   BMI 22.53 kg/m  Gen:   Awake, no distress   Resp:  Normal effort  MSK:   Moves extremities without difficulty  Other:    Medical Decision Making  Medically screening exam initiated at 10:22 AM.  Appropriate orders placed.  Christy [name] was informed that the remainder of the evaluation will be completed by another provider, this initial triage assessment does not replace that evaluation, and the importance of remaining in the ED until their evaluation is complete.  EKG obtained, currently no sinus pauses and patient is asymptomatic, will send basic labs.  Notified charge nurse that patient will need acute bed.  Page placed to cardiology team to let them know that patient has arrived.   Jacqlyn Larsen, [place] 03/31/22 [phone]

## 2022-03-31 NOTE — H&P (Signed)
Cardiology Admission History and Physical   Patient ID: Christy [name] MRN: [id number]; DOB: September 10, 1940   Admission date: 03/31/2022  PCP:  Wenda Low, MD   [facility] Providers Cardiologist:  None        Chief Complaint:  syncope, sinus pauses  Patient Profile:   Christy [name] is a 81 y.o. female with PMHx of AFib/flutter, HTN, breast cancer who is being seen 03/31/2022 for the evaluation of tachy-brady.  History of Present Illness:   Christy [name] last saw Dr. Rayann Heman in may, she was doing well her loop was RRT and removed at this visit. She also follows closely with the AFib clinic and saw Butch Penny on 03/29/22, she reported a syncopal event last week and a monitor was placed. In the clinic she was noted on ECG to have what appears junctional tachycardia 110's or so with rapid drop to SB 50's  The AFib clinic got an alert this morning that she had a 8 second pause, in d/w her she was awake and did get lightheaded with it. She reports an up-tick in her palpitations of late, feels like she is having more Afib as well,  has always been aware of the her heart rate/rhythm, with some mild fleeting dizziness, though last week was the 1st time she has fainted.  No CP No SOB She has not had her usual energy of late, no SOB/DOE   Labs K+ 4.2 BUN/Creat 19/0.93 (calc CrCl is 48, on the 10th 50) WBC 4.6 H/H 13/39 Plts 208  03/29/22, Mag 2.1   Past Medical History:  Diagnosis Date   Anxiety    Atrial fibrillation (HCC)    Bilateral lower extremity edema    Chronic, likely related to venous stasis    DJD (degenerative joint disease), lumbosacral    Dyslipidemia    Dysrhythmia    A fib   Essential hypertension    "dx'd recently" (10/06/2016)   GERD (gastroesophageal reflux disease)    History of hepatitis C virus infection ~ 1997   In remission   Lung granuloma ([place]) 2004   CT scan   Mitral regurgitation    04/30/21 echo: bileaflet prolapse with moderate MR    Osteopenia    Paroxysmal atrial tachycardia    PE (pulmonary embolism) 01/2016   PONV (postoperative nausea and vomiting)    Patient states she had extremem nausea after neck surgery several years ago   Spondylolisthesis    Status post epidural injections 3 by neurosurgery  - Dr. Joya Salm    Varicose veins of both legs with edema    Vitamin D deficiency     Past Surgical History:  Procedure Laterality Date   ATRIAL FIBRILLATION ABLATION N/A 03/09/2017   Procedure: Atrial Fibrillation Ablation;  Surgeon: [name], MD;  Location: [facility];  Service: Cardiovascular;  Laterality: N/A;   BACK SURGERY     BREAST LUMPECTOMY WITH RADIOACTIVE SEED LOCALIZATION Left 08/12/2021   Procedure: LEFT BREAST LUMPECTOMY WITH RADIOACTIVE SEED LOCALIZATION;  Surgeon: Donnie Mesa, MD;  Location: [facility];  Service: General;  Laterality: Left;   CARDIOVERSION N/A 03/21/2016   Procedure: CARDIOVERSION;  Surgeon: [name], MD;  Location: [facility];  Service: Cardiovascular;  Laterality: N/A;   COMBINED HYSTEROSCOPY DIAGNOSTIC / D&C  2005   COMBINED HYSTEROSCOPY DIAGNOSTIC / D&C  1999   DILATION AND CURETTAGE OF UTERUS     implantable loop recorder removal  11/10/2021   LOOP RECORDER INSERTION N/A 10/31/2017  Procedure: LOOP RECORDER INSERTION;  Surgeon: [name], MD;  Location: [facility];  Service: Cardiovascular;  Laterality: N/A;   [place]   "went in front and back; broken neck"   RE-EXCISION OF BREAST CANCER,SUPERIOR MARGINS Left 08/25/2021   Procedure: RE-EXCISION OF SUPERIOR MARGIN -  LEFT BREAST LUMPECTOMY;  Surgeon: Donnie Mesa, MD;  Location: [facility];  Service: General;  Laterality: Left;   TEE WITHOUT CARDIOVERSION N/A 03/08/2017   Procedure: TRANSESOPHAGEAL ECHOCARDIOGRAM (TEE);  Surgeon: Josue Hector, MD;  Location[facility];  Service: Cardiovascular;  Laterality: N/A;   TOE SURGERY Right    replaced joint   TONSILLECTOMY     TUBAL  LIGATION Bilateral 1976   VAGINAL DELIVERY     x2     Medications Prior to Admission: Prior to Admission medications   Medication Sig Start Date End Date Taking? Authorizing Provider  apixaban (ELIQUIS) 5 MG TABS tablet TAKE 1 TABLET BY MOUTH TWICE DAILY(START 02/03/2016) 06/07/17   Allred, Jeneen Rinks, MD  Ascorbic Acid (VITAMIN C) 1000 MG tablet Take 1,000 mg by mouth in the morning. Patient not taking: Reported on 03/29/2022    [provider]  betamethasone valerate (VALISONE) 0.1 % cream Apply 1 application topically 2 (two) times daily as needed (itching.). 03/14/19   [provider]  Cephalexin 500 MG tablet Take 500 mg by mouth 2 (two) times daily. Takes as needed for UTI's 10/07/21   [provider]  cholecalciferol (VITAMIN D3) 25 MCG (1000 UNIT) tablet Take 1,000 Units by mouth in the morning.    [provider]  dofetilide (TIKOSYN) 250 MCG capsule TAKE 1 CAPSULE BY MOUTH TWICE DAILY IN THE MORNING AND DINNER FOR HEART 02/14/22   Sherran Needs, NP  furosemide (LASIX) 20 MG tablet Take 1 tablet (20 mg total) by mouth daily as needed for fluid or edema (weight gain of 3 lbs in 24 hrs or 5 lbs in 1 week). 06/08/16   [name], MD  loratadine (CLARITIN) 10 MG tablet Take 10 mg by mouth in the morning.    [provider]  losartan (COZAAR) 25 MG tablet Take 25 mg by mouth in the morning. 07/16/20   [provider]  Magnesium 250 MG TABS Take 250 mg by mouth at bedtime.    [provider]  Multiple Vitamin (MULTIVITAMIN WITH MINERALS) TABS tablet Take 1 tablet by mouth in the morning.    [provider]  pantoprazole (PROTONIX) 40 MG tablet Take 40 mg by mouth 2 (two) times daily.    [provider]  potassium chloride (KLOR-CON) 10 MEQ tablet TAKE 1 TABLET(10 MEQ) BY MOUTH DAILY 12/28/21   Sherran Needs, NP  Probiotic Product (PROBIOTIC DAILY PO) Take 1 tablet by mouth every morning. May take 2 tablets  as needed    [provider]  propranolol (INDERAL) 20 MG tablet Take 1 tablet (20 mg total) by mouth 2 (two) times daily. 06/07/17   [name], MD     Allergies:    Allergies  Allergen Reactions   Caffeine     Heart palpitations   Epinephrine Palpitations    Social History:   Social History   Socioeconomic History   Marital status: Married    Spouse name: Not on file   Number of children: 2   Years of education: Not on file   Highest education level: Not on file  Occupational History   Not on file  Tobacco  Use   Smoking status: Never   Smokeless tobacco: Never  Vaping Use   Vaping Use: Never used  Substance and Sexual Activity   Alcohol use: Yes    Alcohol/week: 14.0 standard drinks of alcohol    Types: 14 Glasses of wine per week    Comment: occasional   Drug use: No   Sexual activity: Yes    Birth control/protection: Other-see comments    Comment: tubal ligation  Other Topics Concern   Not on file  Social History Narrative   She is a married mother of 1. Lives with her husband. 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 She has never smoked and drinks up to 10 glasses of wine or so week.   She usually exercises roughly 3 days a week doing yoga and plays golf. She is mostly limited by her "time constraints "   Social Determinants of Radio broadcast assistant Strain: Not on file  Food Insecurity: Not on file  Transportation Needs: Not on file  Physical Activity: Not on file  Stress: Not on file  Social Connections: Not on file  Intimate Partner Violence: Not on file    Family History:   The patient's family history includes CVA in her father and mother; Heart Problems in her mother; Heart attack (age of onset: 29) in her mother. There is no history of Colon cancer or Clotting disorder.    ROS:  Please see the history of present illness.  All other ROS reviewed and negative.     Physical Exam/Data:   Vitals:   03/31/22 0959 03/31/22  1006 03/31/22 1127  BP: 107/68  (!) 153/74  Pulse: 61  (!) 55  Resp: 16  12  Temp: 97.6 F (36.4 C)    TempSrc: Oral    SpO2: 97%  100%  Weight:  66.2 kg   Height:  5' 7.5" (1.715 m)    No intake or output data in the 24 hours ending 03/31/22 1159    03/31/2022   10:06 AM 03/29/2022   10:25 AM 02/02/2022    8:25 AM  Last 3 Weights  Weight (lbs) 146 lb 148 lb 9.6 oz 157 lb 1.6 oz  Weight (kg) 66.225 kg 67.405 kg 71.26 kg     Body mass index is 22.53 kg/m.  General:  Well nourished, well developed, in no acute distress HEENT: normal Neck: no JVD Vascular: No carotid bruits; Distal pulses 2+ bilaterally   Cardiac: RRR; no murmurs, gallops or rubs Lungs:  CTA b/l, no wheezing, rhonchi or rales  Abd: soft, nontender Ext: no edema Musculoskeletal:  No deformities Skin: warm and dry  Neuro:  no focal abnormalities noted Psych:  Normal affect    EKG:  The ECG that was done today was personally reviewed and demonstrates SR 61bpm, 1st degree AVblock 265m, QRS 782m QTc 45960mRelevant CV Studies:  04/30/2021: TTE 1. Left ventricular ejection fraction, by estimation, is 65 to 70%. The  left ventricle has normal function. The left ventricle has no regional  wall motion abnormalities. There is moderate asymmetric left ventricular  hypertrophy of the basal-septal  segment. Left ventricular diastolic parameters were normal.   2. Right ventricular systolic function is normal. The right ventricular  size is mildly enlarged. There is mildly elevated pulmonary artery  systolic pressure. The estimated right ventricular systolic pressure is  34.69.6Hg.   3. Left atrial size was severely dilated.   4. Right atrial size was severely dilated.  5. The mitral valve is degenerative. Bileaflet prolapse. Moderate mitral  valve regurgitation.   6. Pulmonic valve regurgitation is mild to moderate.   7. The aortic valve is tricuspid. Aortic valve regurgitation is trivial.  No aortic  stenosis is present.   Laboratory Data:  High Sensitivity Troponin:  No results for input(s): "TROPONINIHS" in the last 720 hours.    Chemistry Recent Labs  Lab 03/29/22 1148 03/31/22 1022  NA 138 138  K 4.3 4.2  CL 105 106  CO2 26 26  GLUCOSE 129* 137*  BUN 23 19  CREATININE 0.89 0.93  CALCIUM 9.3 9.7  MG 2.1  --   GFRNONAA >60 >60  ANIONGAP 7 6    No results for input(s): "PROT", "ALBUMIN", "AST", "ALT", "ALKPHOS", "BILITOT" in the last 168 hours. Lipids No results for input(s): "CHOL", "TRIG", "HDL", "LABVLDL", "LDLCALC", "CHOLHDL" in the last 168 hours. Hematology Recent Labs  Lab 03/29/22 1148 03/31/22 1022  WBC 5.5 4.6  RBC 4.05 4.08  HGB 12.8 13.3  HCT 39.2 39.2  MCV 96.8 96.1  MCH 31.6 32.6  MCHC 32.7 33.9  RDW 13.2 13.0  PLT 214 208   Thyroid No results for input(s): "TSH", "FREET4" in the last 168 hours. BNPNo results for input(s): "BNP", "PROBNP" in the last 168 hours.  DDimer No results for input(s): "DDIMER" in the last 168 hours.   Radiology/Studies:  No results found.   Assessment and Plan:   Symptomatic bradycardia/pauses Tachy-brady syndrome Dr. Quentin Ore reviewed monitor findings and d/w the patient and rational for PPM Discussed implant procedure, potential Admit, plan for implant tomorrow  Will go ahead and update her echo as well  Paroxysmal AFib Atypical AFlutter CHA2DSVasc is 5, on Eliquis  Hold for implant tomorrow Hold propanolol for now, plan to increase dosing post pacer Continue Tikosyn  HTN Home meds otherwise   Risk Assessment/Risk Scores:    For questions or updates, please contact [place] Please consult [email or web address] for contact info under     Signed, Baldwin Jamaica, PA-C  03/31/2022 11:59 AM

## 2022-04-01 ENCOUNTER — Inpatient Hospital Stay (HOSPITAL_COMMUNITY)
Admission: EM | Disposition: A | Discharge status: Home / Self Care | Payer: Self-pay | Source: Home / Self Care | Attending: Cardiology

## 2022-04-01 HISTORY — PX: PACEMAKER IMPLANT: EP1218

## 2022-04-01 LAB — ECHOCARDIOGRAM COMPLETE
AR max vel: 2.38 cm2
AV Peak grad: 5.4 mmHg
Ao pk vel: 1.16 m/s
Area-P 1/2: 3.6 cm2
Height: 67.5 in
MV M vel: 5.93 m/s
MV Peak grad: 140.7 mmHg
S' Lateral: 3 cm
Weight: 2336 oz

## 2022-04-01 LAB — BASIC METABOLIC PANEL
Anion gap: 8 (ref 5–15)
BUN: 16 mg/dL (ref 8–23)
CO2: 26 mmol/L (ref 22–32)
Calcium: 9.6 mg/dL (ref 8.9–10.3)
Chloride: 105 mmol/L (ref 98–111)
Creatinine, Ser: 0.75 mg/dL (ref 0.44–1.00)
GFR, Estimated: >60 mL/min (ref >60)
Glucose, Bld: 89 mg/dL (ref 70–99)
Potassium: 3.8 mmol/L (ref 3.5–5.1)
Sodium: 139 mmol/L (ref 135–145)

## 2022-04-01 LAB — MAGNESIUM: Magnesium: 1.9 mg/dL (ref 1.7–2.4)

## 2022-04-01 SURGERY — PACEMAKER IMPLANT

## 2022-04-01 MED ORDER — MAGNESIUM SULFATE 2 GM/50ML IV SOLN
2.0000 g | Freq: Once | INTRAVENOUS | Status: AC
  Administered 2022-04-01: 2 g via INTRAVENOUS
  Filled 2022-04-01: qty 50

## 2022-04-01 MED ORDER — FENTANYL CITRATE (PF) 100 MCG/2ML IJ SOLN
INTRAMUSCULAR | Status: DC | PRN
  Administered 2022-04-01: 25 ug via INTRAVENOUS
  Administered 2022-04-01: 12.5 ug via INTRAVENOUS

## 2022-04-01 MED ORDER — POTASSIUM CHLORIDE CRYS ER 20 MEQ PO TBCR
40.0000 meq | EXTENDED_RELEASE_TABLET | Freq: Once | ORAL | Status: AC
  Administered 2022-04-01: 40 meq via ORAL
  Filled 2022-04-01: qty 2

## 2022-04-01 MED ORDER — ORAL CARE MOUTH RINSE: 15.0000 mL | OROMUCOSAL | Status: DC | PRN

## 2022-04-01 MED ORDER — CEFAZOLIN SODIUM-DEXTROSE 2-4 GM/100ML-% IV SOLN
INTRAVENOUS | Status: AC
  Filled 2022-04-01: qty 100

## 2022-04-01 MED ORDER — ONDANSETRON HCL 4 MG/2ML IJ SOLN: 4.0000 mg | Freq: Four times a day (QID) | INTRAMUSCULAR | Status: DC | PRN

## 2022-04-01 MED ORDER — FENTANYL CITRATE (PF) 100 MCG/2ML IJ SOLN
INTRAMUSCULAR | Status: AC
  Filled 2022-04-01: qty 2

## 2022-04-01 MED ORDER — HEPARIN (PORCINE) IN NACL 1000-0.9 UT/500ML-% IV SOLN
INTRAVENOUS | Status: AC
  Filled 2022-04-01: qty 500

## 2022-04-01 MED ORDER — OFF THE BEAT BOOK
Freq: Once | Status: AC
  Filled 2022-04-01: qty 1

## 2022-04-01 MED ORDER — SODIUM CHLORIDE 0.9 % IV SOLN
INTRAVENOUS | Status: AC
  Filled 2022-04-01: qty 2

## 2022-04-01 MED ORDER — IOPAMIDOL (ISOVUE-370) INJECTION 76%
INTRAVENOUS | Status: DC | PRN
  Administered 2022-04-01: 5 mL

## 2022-04-01 MED ORDER — LIDOCAINE HCL (PF) 1 % IJ SOLN
INTRAMUSCULAR | Status: AC
  Filled 2022-04-01: qty 60

## 2022-04-01 MED ORDER — PROPRANOLOL HCL 20 MG PO TABS
20.0000 mg | ORAL_TABLET | Freq: Two times a day (BID) | ORAL | Status: DC
  Administered 2022-04-01 – 2022-04-02 (×2): 20 mg via ORAL
  Filled 2022-04-01 (×3): qty 1

## 2022-04-01 MED ORDER — LIDOCAINE HCL (PF) 1 % IJ SOLN
INTRAMUSCULAR | Status: DC | PRN
  Administered 2022-04-01: 60 mL

## 2022-04-01 MED ORDER — MIDAZOLAM HCL 5 MG/5ML IJ SOLN
INTRAMUSCULAR | Status: AC
  Filled 2022-04-01: qty 5

## 2022-04-01 MED ORDER — MIDAZOLAM HCL 5 MG/5ML IJ SOLN
INTRAMUSCULAR | Status: DC | PRN
  Administered 2022-04-01: .5 mg via INTRAVENOUS
  Administered 2022-04-01: 1 mg via INTRAVENOUS

## 2022-04-01 MED ORDER — HEPARIN (PORCINE) IN NACL 1000-0.9 UT/500ML-% IV SOLN
INTRAVENOUS | Status: DC | PRN
  Administered 2022-04-01: 500 mL

## 2022-04-01 SURGICAL SUPPLY — 12 items

## 2022-04-01 NOTE — Progress Notes (Signed)
Electrophysiology Rounding Note  Patient Name: Christy [name] Date of Encounter: 04/01/2022  Primary Cardiologist: None Electrophysiologist: previously Dr. Rayann Heman    Subjective   The patient is doing well this AM. NPO for PM today.  Nervous about heart rhythm.   Inpatient Medications    Scheduled Meds:  chlorhexidine  60 mL Topical Once   dofetilide  250 mcg Oral BID   gentamicin (GARAMYCIN) 80 mg in sodium chloride 0.9 % 500 mL irrigation  80 mg Irrigation On Call   loratadine  10 mg Oral q AM   losartan  25 mg Oral q AM   pantoprazole  40 mg Oral BID   sodium chloride flush  3 mL Intravenous Q12H   Continuous Infusions:  sodium chloride 50 mL/hr at 04/01/22 0517   sodium chloride      ceFAZolin (ANCEF) IV     PRN Meds: acetaminophen, mouth rinse, sodium chloride flush   Vital Signs    Vitals:   04/01/22 0018 04/01/22 0358 04/01/22 0624 04/01/22 0800  BP: 138/67 98/76 138/88 (!) 132/90  Pulse: (!) 52 (!) 52 92 (!) 109  Resp: '14 16 18 18  '$ Temp: 97.8 F (36.6 C) 97.7 F (36.5 C)  (!) 97.5 F (36.4 C)  TempSrc: Oral Oral  Oral  SpO2: 96% 95% 97% 95%  Weight:      Height:        Intake/Output Summary (Last 24 hours) at 04/01/2022 0947 Last data filed at 03/31/2022 2125 Gross per 24 hour  Intake 240 ml  Output --  Net 240 ml   Filed Weights   03/31/22 1006 03/31/22 2125  Weight: 66.2 kg 66.5 kg    Physical Exam    GEN- The patient is well appearing, alert and oriented x 3 today.   Head- normocephalic, atraumatic Eyes-  Sclera clear, conjunctiva pink Ears- hearing intact Oropharynx- clear Neck- supple Lungs- Clear to ausculation bilaterally, normal work of breathing Heart- Irregularly irregular rate and rhythm, no murmurs, rubs or gallops GI- soft, NT, ND, + BS Extremities- no clubbing or cyanosis. No edema Skin- no rash or lesion Psych- euthymic mood, full affect Neuro- strength and sensation are intact  Labs    CBC Recent Labs     03/29/22 1148 03/31/22 1022  WBC 5.5 4.6  NEUTROABS  --  3.4  HGB 12.8 13.3  HCT 39.2 39.2  MCV 96.8 96.1  PLT 214 938   Basic Metabolic Panel Recent Labs    03/29/22 1148 03/31/22 1022 04/01/22 0324  NA 138 138 139  K 4.3 4.2 3.8  CL 105 106 105  CO2 '26 26 26  '$ GLUCOSE 129* 137* 89  BUN '23 19 16  '$ CREATININE 0.89 0.93 0.75  CALCIUM 9.3 9.7 9.6  MG 2.1  --  1.9   Liver Function Tests No results for input(s): "AST", "ALT", "ALKPHOS", "BILITOT", "PROT", "ALBUMIN" in the last 72 hours. No results for input(s): "LIPASE", "AMYLASE" in the last 72 hours. Cardiac Enzymes No results for input(s): "CKTOTAL", "CKMB", "CKMBINDEX", "TROPONINI" in the last 72 hours.   Telemetry    Aflutter with 2:1 and 3:1 variable conducted beats (personally reviewed)  Radiology    ECHOCARDIOGRAM COMPLETE  Result Date: 04/01/2022    ECHOCARDIOGRAM REPORT   Patient Name:   Christy [name] Date of Exam: 03/31/2022 Medical Rec #:  [id number]      Height:       67.5 in Accession #:    [id number]  Weight:       146.0 lb Date of Birth:  03-18-1941      BSA:          1.779 m Patient Age:    81 years       BP:           150/68 mmHg Patient Gender: F              HR:           56 bpm. Exam Location:  Inpatient Procedure: 2D Echo, Cardiac Doppler and Color Doppler Indications:    Abnormal ECG  History:        Patient has prior history of Echocardiogram examinations, most                 recent 05/01/2011. Risk Factors:Hypertension and Dyslipidemia.  Sonographer:    Jefferey Pica Referring Phys: [id number] [facility]  1. Left ventricular ejection fraction, by estimation, is 60 to 65%. The left ventricle has normal function. The left ventricle has no regional wall motion abnormalities. There is mild concentric left ventricular hypertrophy. Left ventricular diastolic parameters are consistent with Grade II diastolic dysfunction (pseudonormalization).  2. Right ventricular systolic function is  normal. The right ventricular size is normal. There is normal pulmonary artery systolic pressure.  3. Left atrial size was severely dilated.  4. Right atrial size was severely dilated.  5. The mitral valve is abnormal. Mild to moderate mitral valve regurgitation. No evidence of mitral stenosis. There is mild holosystolic prolapse of anterior leaflet of the mitral valve.  6. The aortic valve is tricuspid. Aortic valve regurgitation is mild. No aortic stenosis is present.  7. The inferior vena cava is normal in size with greater than 50% respiratory variability, suggesting right atrial pressure of 3 mmHg. FINDINGS  Left Ventricle: Left ventricular ejection fraction, by estimation, is 60 to 65%. The left ventricle has normal function. The left ventricle has no regional wall motion abnormalities. The left ventricular internal cavity size was normal in size. There is  mild concentric left ventricular hypertrophy. Left ventricular diastolic parameters are consistent with Grade II diastolic dysfunction (pseudonormalization). Indeterminate filling pressures. Right Ventricle: The right ventricular size is normal. No increase in right ventricular wall thickness. Right ventricular systolic function is normal. There is normal pulmonary artery systolic pressure. The tricuspid regurgitant velocity is 2.41 m/s, and  with an assumed right atrial pressure of 3 mmHg, the estimated right ventricular systolic pressure is 86.7 mmHg. Left Atrium: Left atrial size was severely dilated. Right Atrium: Right atrial size was severely dilated. Pericardium: There is no evidence of pericardial effusion. Mitral Valve: The mitral valve is abnormal. There is mild holosystolic prolapse of anterior leaflet of the mitral valve. There is moderate thickening of the mitral valve leaflet(s). Mild to moderate mitral valve regurgitation. No evidence of mitral valve  stenosis. Tricuspid Valve: The tricuspid valve is normal in structure. Tricuspid valve  regurgitation is trivial. No evidence of tricuspid stenosis. Aortic Valve: The aortic valve is tricuspid. Aortic valve regurgitation is mild. No aortic stenosis is present. Aortic valve peak gradient measures 5.4 mmHg. Pulmonic Valve: The pulmonic valve was normal in structure. Pulmonic valve regurgitation is trivial. No evidence of pulmonic stenosis. Aorta: The aortic root is normal in size and structure. Venous: The inferior vena cava is normal in size with greater than 50% respiratory variability, suggesting right atrial pressure of 3 mmHg. IAS/Shunts: No atrial level shunt detected by color flow Doppler.  LEFT  VENTRICLE PLAX 2D LVIDd:         4.50 cm   Diastology LVIDs:         3.00 cm   LV e' medial:    4.25 cm/s LV PW:         1.20 cm   LV E/e' medial:  18.4 LV IVS:        1.10 cm   LV e' lateral:   6.68 cm/s LVOT diam:     1.90 cm   LV E/e' lateral: 11.7 LV SV:         55 LV SV Index:   31 LVOT Area:     2.84 cm  RIGHT VENTRICLE             IVC RV Basal diam:  3.90 cm     IVC diam: 1.90 cm RV Mid diam:    3.40 cm RV S prime:     17.70 cm/s TAPSE (M-mode): 3.0 cm LEFT ATRIUM              Index        RIGHT ATRIUM           Index LA diam:        4.70 cm  2.64 cm/m   RA Area:     27.90 cm LA Vol (A2C):   130.0 ml 73.08 ml/m  RA Volume:   85.70 ml  48.18 ml/m LA Vol (A4C):   103.0 ml 57.90 ml/m LA Biplane Vol: 116.0 ml 65.21 ml/m  AORTIC VALVE                 PULMONIC VALVE AV Area (Vmax): 2.38 cm     PV Vmax:       0.69 m/s AV Vmax:        116.00 cm/s  PV Peak grad:  1.9 mmHg AV Peak Grad:   5.4 mmHg LVOT Vmax:      97.40 cm/s LVOT Vmean:     55.500 cm/s LVOT VTI:       0.194 m  AORTA Ao Root diam: 3.60 cm Ao Asc diam:  3.20 cm MITRAL VALVE               TRICUSPID VALVE MV Area (PHT): 3.60 cm    TR Peak grad:   23.2 mmHg MV Decel Time: 211 msec    TR Vmax:        241.00 cm/s MR Peak grad: 140.7 mmHg MR Vmax:      593.00 cm/s  SHUNTS MV E velocity: 78.10 cm/s  Systemic VTI:  0.19 m MV A velocity: 49.30  cm/s  Systemic Diam: 1.90 cm MV E/A ratio:  1.58 Skeet Latch MD Electronically signed by Skeet Latch MD Signature Date/Time: 04/01/2022/6:26:55 AM    Final     Patient Profile     Christy [name] is a 81 y.o. female with PMHx of AFib/flutter, HTN, breast cancer who was admitted for the evaluation of tachy-brady. She had a syncopal event last week and a monitor was placed by the afib clinic. The afib clinic received an alert 10/12 that the patient had an 8 second pause.   PM planned for today  Assessment & Plan    Symptomatic bradycardia/pauses Tachy-brady syndrome No easily reversible causes for bradycardia/pauses Echo - LVEF 60-65%; LA and RA severely dilated Discussed PM procedure NPO for procedure    Paroxysmal AFib Atypical AFlutter CHA2DSVasc is 5, on Eliquis  Hold for implant today Hold propanolol  for now, plan to increase dosing post pacer Continue Tikosyn    HTN Home meds otherwise   For questions or updates, please contact [facility] Please consult [email or web address] for contact info under Cardiology/STEMI.  Signed, Mamie Levers, NP  04/01/2022, 9:47 AM

## 2022-04-01 NOTE — Progress Notes (Signed)
Pt HR in the 110s. NP Mancel Bale paged. No new orders- will continue to monitor.

## 2022-04-01 NOTE — Progress Notes (Signed)
Patient taken to cath lab at 1411hrs.

## 2022-04-01 NOTE — Progress Notes (Signed)
Mobility Specialist - Progress Note   04/01/22 0947  Mobility  Activity Ambulated with assistance in hallway  Level of Assistance Standby assist, set-up cues, supervision of patient - no hands on  Assistive Device None  Distance Ambulated (ft) 240 ft  Activity Response Tolerated well  Mobility Referral Yes  $Mobility charge 1 Mobility    Pre-mobility:113 HR During mobility:97 HR Post-mobility: 107 HR  Pt was received in bed and agreeable to mobility. Pt c/o back and neck pain along with general weakness. Pt was returned to bed with all needs met.   Larey Seat

## 2022-04-01 NOTE — Discharge Instructions (Addendum)
You need to have lab drawn on 04/05/22 to check you potassium and magnesium - you may eat before you go.    No Eliquis until 04/07/22 so you will not bleed at the pacemaker site.  Resume as before on the 19th.   After Your Pacemaker   You have a [employer] Pacemaker  ACTIVITY Do not lift your arm above shoulder height for 1 week after your procedure. After 7 days, you may progress as below.  You should remove your sling 24 hours after your procedure, unless otherwise instructed by your provider.     Friday April 08, 2022  Saturday April 09, 2022 Sunday April 10, 2022 Monday April 11, 2022   Do not lift, push, pull, or carry anything over 10 pounds with the affected arm until 6 weeks (Friday May 13, 2022 ) after your procedure.   You may drive AFTER your wound check, unless you have been told otherwise by your provider.   Ask your healthcare provider when you can go back to work   INCISION/Dressing If you are on a blood thinner such as Coumadin, Xarelto, Eliquis, Plavix, or Pradaxa please confirm with your provider when this should be resumed. *  If large square, outer bandage is left in place, this can be removed after 24 hours from your procedure. Do not remove steri-strips or glue as below.   Monitor your Pacemaker site for redness, swelling, and drainage. Call the device clinic at [phone] if you experience these symptoms or fever/chills.  If your incision is sealed with Steri-strips or staples, you may shower 7 days after your procedure or when told by your provider. Do not remove the steri-strips or let the shower hit directly on your site. You may wash around your site with soap and water.    If you were discharged in a sling, please do not wear this during the day more than 48 hours after your surgery unless otherwise instructed. This may increase the risk of stiffness and soreness in your shoulder.   Avoid lotions, ointments, or perfumes over your incision  until it is well-healed.  You may use a hot tub or a pool AFTER your wound check appointment if the incision is completely closed.  Pacemaker Alerts:  Some alerts are vibratory and others beep. These are NOT emergencies. Please call our office to let us know. If this occurs at night or on weekends, it can wait until the next business day. Send a remote transmission.  If your device is capable of reading fluid status (for heart failure), you will be offered monthly monitoring to review this with you.   DEVICE MANAGEMENT Remote monitoring is used to monitor your pacemaker from home. This monitoring is scheduled every 91 days by our office. It allows [place] to keep an eye on the functioning of your device to ensure it is working properly. You will routinely see your Electrophysiologist annually (more often if necessary).   You should receive your ID card for your new device in 4-8 weeks. Keep this card with you at all times once received. Consider wearing a medical alert bracelet or necklace.  Your Pacemaker may be MRI compatible. This will be discussed at your next office visit/wound check.  You should avoid contact with strong electric or magnetic fields.   Do not use amateur (ham) radio equipment or electric (arc) welding torches. MP3 player headphones with magnets should not be used. Some devices are safe to use if held at least 12 inches (30  cm) from your Pacemaker. These include power tools, lawn mowers, and speakers. If you are unsure if something is safe to use, ask your health care provider.  When using your cell phone, hold it to the ear that is on the opposite side from the Pacemaker. Do not leave your cell phone in a pocket over the Pacemaker.  You may safely use electric blankets, heating pads, computers, and microwave ovens.  Call the office right away if: You have chest pain. You feel more short of breath than you have felt before. You feel more light-headed than you have felt  before. Your incision starts to open up.  This information is not intended to replace advice given to you by your health care provider. Make sure you discuss any questions you have with your health care provider.

## 2022-04-02 ENCOUNTER — Encounter (HOSPITAL_COMMUNITY): Payer: Self-pay | Admitting: Cardiology

## 2022-04-02 ENCOUNTER — Inpatient Hospital Stay (HOSPITAL_COMMUNITY): Payer: PPO

## 2022-04-02 ENCOUNTER — Other Ambulatory Visit: Payer: Self-pay | Admitting: Cardiology

## 2022-04-02 DIAGNOSIS — I4892 Unspecified atrial flutter: Secondary | ICD-10-CM | POA: Diagnosis present

## 2022-04-02 DIAGNOSIS — E876 Hypokalemia: Secondary | ICD-10-CM

## 2022-04-02 DIAGNOSIS — Z950 Presence of cardiac pacemaker: Secondary | ICD-10-CM | POA: Diagnosis not present

## 2022-04-02 DIAGNOSIS — Z5181 Encounter for therapeutic drug level monitoring: Secondary | ICD-10-CM

## 2022-04-02 HISTORY — DX: Unspecified atrial flutter: I48.92

## 2022-04-02 MED ORDER — APIXABAN 5 MG PO TABS: 5.0000 mg | ORAL_TABLET | Freq: Two times a day (BID) | ORAL | Status: DC

## 2022-04-02 MED ORDER — ACETAMINOPHEN 325 MG PO TABS: 650.0000 mg | ORAL_TABLET | ORAL | Status: AC | PRN

## 2022-04-02 NOTE — Progress Notes (Signed)
Rounding Note    Patient Name: Christy [name] Date of Encounter: 04/02/2022  [place] Cardiologist: None   Subjective   NAEO. Doing well after 04/01/2022 pacemaker implant.  Inpatient Medications    Scheduled Meds:  dofetilide  250 mcg Oral BID   loratadine  10 mg Oral q AM   losartan  25 mg Oral q AM   pantoprazole  40 mg Oral BID   propranolol  20 mg Oral BID   sodium chloride flush  3 mL Intravenous Q12H   Continuous Infusions:  PRN Meds: acetaminophen, ondansetron (ZOFRAN) IV, mouth rinse   Vital Signs    Vitals:   04/01/22 1956 04/01/22 2245 04/02/22 0511 04/02/22 0752  BP: 117/67 (!) 127/90 124/65 131/65  Pulse: 93 94 61 61  Resp: '18 16 16 16  '$ Temp: 97.6 F (36.4 C) 97.8 F (36.6 C) 97.7 F (36.5 C) 98 F (36.7 C)  TempSrc: Oral Oral Oral Oral  SpO2: 92% 97% 93% 97%  Weight:      Height:        Intake/Output Summary (Last 24 hours) at 04/02/2022 0831 Last data filed at 04/01/2022 1821 Gross per 24 hour  Intake 865 ml  Output --  Net 865 ml      03/31/2022    9:25 PM 03/31/2022   10:06 AM 03/29/2022   10:25 AM  Last 3 Weights  Weight (lbs) 146 lb 8 oz 146 lb 148 lb 9.6 oz  Weight (kg) 66.452 kg 66.225 kg 67.405 kg      Telemetry    Sinus rhythm this morning.Converted to sinus around 11:45 pm - Personally Reviewed  ECG    Sinus. QTc 443m. - Personally Reviewed  Physical Exam   GEN: No acute distress.   Neck: No JVD Cardiac: RRR, no murmurs, rubs, or gallops. Ppm site without significant pain or hematoma. Respiratory: Clear to auscultation bilaterally. GI: Soft, nontender, non-distended  MS: No edema; No deformity. Neuro:  Nonfocal  Psych: Normal affect   Labs    High Sensitivity Troponin:  No results for input(s): "TROPONINIHS" in the last 720 hours.   Chemistry Recent Labs  Lab 03/29/22 1148 03/31/22 1022 04/01/22 0324  NA 138 138 139  K 4.3 4.2 3.8  CL 105 106 105  CO2 '26 26 26  '$ GLUCOSE 129* 137* 89   BUN '23 19 16  '$ CREATININE 0.89 0.93 0.75  CALCIUM 9.3 9.7 9.6  MG 2.1  --  1.9  GFRNONAA >60 >60 >60  ANIONGAP '7 6 8    '$ Lipids No results for input(s): "CHOL", "TRIG", "HDL", "LABVLDL", "LDLCALC", "CHOLHDL" in the last 168 hours.  Hematology Recent Labs  Lab 03/29/22 1148 03/31/22 1022  WBC 5.5 4.6  RBC 4.05 4.08  HGB 12.8 13.3  HCT 39.2 39.2  MCV 96.8 96.1  MCH 31.6 32.6  MCHC 32.7 33.9  RDW 13.2 13.0  PLT 214 208   Thyroid No results for input(s): "TSH", "FREET4" in the last 168 hours.  BNPNo results for input(s): "BNP", "PROBNP" in the last 168 hours.  DDimer No results for input(s): "DDIMER" in the last 168 hours.   Radiology    EP PPM/ICD IMPLANT  Result Date: 04/01/2022  CONCLUSIONS:  1. 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 Dual chamber permanent pacemaker with left bundle area lead  3.  No early apparent complications.  4. Hold anticoagulation for 5 days. (Restart 04/07/2022)   ECHOCARDIOGRAM COMPLETE  Result Date: 04/01/2022  ECHOCARDIOGRAM REPORT   Patient Name:   Christy [name] Date of Exam: 03/31/2022 Medical Rec #:  [id number]      Height:       67.5 in Accession #:    [id number]     Weight:       146.0 lb Date of Birth:  11/30/1940      BSA:          1.779 m Patient Age:    24 years       BP:           150/68 mmHg Patient Gender: F              HR:           56 bpm. Exam Location:  Inpatient Procedure: 2D Echo, Cardiac Doppler and Color Doppler Indications:    Abnormal ECG  History:        Patient has prior history of Echocardiogram examinations, most                 recent 05/01/2011. Risk Factors:Hypertension and Dyslipidemia.  Sonographer:    Jefferey Pica Referring Phys: [id number] [place]  1. Left ventricular ejection fraction, by estimation, is 60 to 65%. The left ventricle has normal function. The left ventricle has no regional wall motion abnormalities. There is mild concentric left ventricular hypertrophy. Left  ventricular diastolic parameters are consistent with Grade II diastolic dysfunction (pseudonormalization).  2. Right ventricular systolic function is normal. The right ventricular size is normal. There is normal pulmonary artery systolic pressure.  3. Left atrial size was severely dilated.  4. Right atrial size was severely dilated.  5. The mitral valve is abnormal. Mild to moderate mitral valve regurgitation. No evidence of mitral stenosis. There is mild holosystolic prolapse of anterior leaflet of the mitral valve.  6. The aortic valve is tricuspid. Aortic valve regurgitation is mild. No aortic stenosis is present.  7. The inferior vena cava is normal in size with greater than 50% respiratory variability, suggesting right atrial pressure of 3 mmHg. FINDINGS  Left Ventricle: Left ventricular ejection fraction, by estimation, is 60 to 65%. The left ventricle has normal function. The left ventricle has no regional wall motion abnormalities. The left ventricular internal cavity size was normal in size. There is  mild concentric left ventricular hypertrophy. Left ventricular diastolic parameters are consistent with Grade II diastolic dysfunction (pseudonormalization). Indeterminate filling pressures. Right Ventricle: The right ventricular size is normal. No increase in right ventricular wall thickness. Right ventricular systolic function is normal. There is normal pulmonary artery systolic pressure. The tricuspid regurgitant velocity is 2.41 m/s, and  with an assumed right atrial pressure of 3 mmHg, the estimated right ventricular systolic pressure is 75.1 mmHg. Left Atrium: Left atrial size was severely dilated. Right Atrium: Right atrial size was severely dilated. Pericardium: There is no evidence of pericardial effusion. Mitral Valve: The mitral valve is abnormal. There is mild holosystolic prolapse of anterior leaflet of the mitral valve. There is moderate thickening of the mitral valve leaflet(s). Mild to moderate  mitral valve regurgitation. No evidence of mitral valve  stenosis. Tricuspid Valve: The tricuspid valve is normal in structure. Tricuspid valve regurgitation is trivial. No evidence of tricuspid stenosis. Aortic Valve: The aortic valve is tricuspid. Aortic valve regurgitation is mild. No aortic stenosis is present. Aortic valve peak gradient measures 5.4 mmHg. Pulmonic Valve: The pulmonic valve was normal in structure. Pulmonic valve regurgitation is trivial. No evidence of pulmonic stenosis.  Aorta: The aortic root is normal in size and structure. Venous: The inferior vena cava is normal in size with greater than 50% respiratory variability, suggesting right atrial pressure of 3 mmHg. IAS/Shunts: No atrial level shunt detected by color flow Doppler.  LEFT VENTRICLE PLAX 2D LVIDd:         4.50 cm   Diastology LVIDs:         3.00 cm   LV e' medial:    4.25 cm/s LV PW:         1.20 cm   LV E/e' medial:  18.4 LV IVS:        1.10 cm   LV e' lateral:   6.68 cm/s LVOT diam:     1.90 cm   LV E/e' lateral: 11.7 LV SV:         55 LV SV Index:   31 LVOT Area:     2.84 cm  RIGHT VENTRICLE             IVC RV Basal diam:  3.90 cm     IVC diam: 1.90 cm RV Mid diam:    3.40 cm RV S prime:     17.70 cm/s TAPSE (M-mode): 3.0 cm LEFT ATRIUM              Index        RIGHT ATRIUM           Index LA diam:        4.70 cm  2.64 cm/m   RA Area:     27.90 cm LA Vol (A2C):   130.0 ml 73.08 ml/m  RA Volume:   85.70 ml  48.18 ml/m LA Vol (A4C):   103.0 ml 57.90 ml/m LA Biplane Vol: 116.0 ml 65.21 ml/m  AORTIC VALVE                 PULMONIC VALVE AV Area (Vmax): 2.38 cm     PV Vmax:       0.69 m/s AV Vmax:        116.00 cm/s  PV Peak grad:  1.9 mmHg AV Peak Grad:   5.4 mmHg LVOT Vmax:      97.40 cm/s LVOT Vmean:     55.500 cm/s LVOT VTI:       0.194 m  AORTA Ao Root diam: 3.60 cm Ao Asc diam:  3.20 cm MITRAL VALVE               TRICUSPID VALVE MV Area (PHT): 3.60 cm    TR Peak grad:   23.2 mmHg MV Decel Time: 211 msec    TR Vmax:         241.00 cm/s MR Peak grad: 140.7 mmHg MR Vmax:      593.00 cm/s  SHUNTS MV E velocity: 78.10 cm/s  Systemic VTI:  0.19 m MV A velocity: 49.30 cm/s  Systemic Diam: 1.90 cm MV E/A ratio:  1.58 Skeet Latch MD Electronically signed by Skeet Latch MD Signature Date/Time: 04/01/2022/6:26:55 AM    Final      Assessment & Plan    NYAZIA CANEVARI is a 81 y.o. female with PMHx of AFib/flutter, HTN, breast cancer who was admitted for the evaluation of tachy-brady. She had a syncopal event last week and a monitor was placed by the afib clinic. The afib clinic received an alert 10/12 that the patient had an 8 second pause.  She is now s/p ppm 04/01/2022.  #Tachybrady #AF #symptomatic bradycardia  Doing well after ppm implant. Device interrogation OK. Chest x ray shows stable lead position. Hold anticoagulation for 5 days. (Restart 04/07/2022)   #HTN Home meds  OK for discharge later today.   For questions or updates, please contact [place] Please consult [email or web address] for contact info under        Signed, Vickie Epley, MD  04/02/2022, 8:31 AM

## 2022-04-02 NOTE — Discharge Summary (Signed)
Discharge Summary    Patient ID: Christy [name] MRN: [id number]; DOB: January 04, 1941  Admit date: 03/31/2022 Discharge date: 04/02/2022  PCP:  Wenda Low, MD   [facility] Providers Cardiologist:  None        Discharge Diagnoses    Principal Problem:   Tachycardia-bradycardia [facility]) Active Problems:   Symptomatic bradycardia   S/P placement of cardiac pacemaker 04/01/22    Paroxysmal atrial flutter [facility])    Diagnostic Studies/Procedures    04/01/22  Dual chamber permanent pacemaker implant     CONCLUSIONS:   1. 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 Dual chamber permanent pacemaker with left bundle area lead  3.  No early apparent complications.   4. Hold anticoagulation for 5 days. (Restart 04/07/2022)   _____________   History of Present Illness     [name] is a 81 y.o. female with hx of atrial fib followed in a fib clinic and on 03/29/22 she reported syncopal event the week before and monitor was placed.  In OV she had junctional tack at 110 or so and rapid drop to SB in 50s.  On AM 03/31/22 an alert came for 8 second pause.  Pt did become lightheaded with it.  Pt also noted more freq a fib, and more palpitations. She was sent to ER.   Dr. Quentin Ore saw the pt and discussed PPM and benefit/Risk.  Pt agreed to proceed.  Her eliquis was placed on hold.  Propanolol was held.  Tikosyn continued.    Labs were stable. K+ 4.2, Hgb 13. Mag 2.1 on the 10th.  She was admitted for monitoring and plan for PPM on the 13th.  Hospital Course     Consultants: none  Pt was admitted  and on 04/01/22 underwent PPM.  No complications.  Today pt has been seen and evaluated by Dr. Quentin Ore and found stable for discharge.  Her device numbers are good.  CXR with stable lead position and no pneumothorax.    Will hold her Eliquis for 5 days and resume 04/07/22. VS stable.  She converted from atrial fib to SR last evening 11;45 pm.   QYc of 466 ms.   Mg  1.9 yesterday and k+ 3.8  has rec' IV Mg+ and K+ as well.   We will have her have lab BMP and Mg+ on Tuesday the 17th to recheck.       Did the patient have an acute coronary syndrome (MI, NSTEMI, STEMI, etc) this admission?:  No                               Did the patient have a percutaneous coronary intervention (stent / angioplasty)?:  No.          _____________  Discharge Vitals Blood pressure 131/65, pulse 61, temperature 98 F (36.7 C), temperature source Oral, resp. rate 16, height 5' 7.5" (1.715 m), weight 66.5 kg, SpO2 97 %.  Filed Weights   03/31/22 1006 03/31/22 2125  Weight: 66.2 kg 66.5 kg    Labs & Radiologic Studies    CBC Recent Labs    03/31/22 1022  WBC 4.6  NEUTROABS 3.4  HGB 13.3  HCT 39.2  MCV 96.1  PLT 630   Basic Metabolic Panel Recent Labs    03/31/22 1022 04/01/22 0324  NA 138 139  K 4.2 3.8  CL 106 105  CO2 26 26  GLUCOSE 137* 89  BUN 19 16  CREATININE 0.93 0.75  CALCIUM 9.7 9.6  MG  --  1.9   Liver Function Tests No results for input(s): "AST", "ALT", "ALKPHOS", "BILITOT", "PROT", "ALBUMIN" in the last 72 hours. No results for input(s): "LIPASE", "AMYLASE" in the last 72 hours. High Sensitivity Troponin:   No results for input(s): "TROPONINIHS" in the last 720 hours.  BNP Invalid input(s): "POCBNP" D-Dimer No results for input(s): "DDIMER" in the last 72 hours. Hemoglobin A1C No results for input(s): "HGBA1C" in the last 72 hours. Fasting Lipid Panel No results for input(s): "CHOL", "HDL", "LDLCALC", "TRIG", "CHOLHDL", "LDLDIRECT" in the last 72 hours. Thyroid Function Tests No results for input(s): "TSH", "T4TOTAL", "T3FREE", "THYROIDAB" in the last 72 hours.  Invalid input(s): "FREET3" _____________  DG Chest 2 View  Result Date: 04/02/2022 CLINICAL DATA:  Pacemaker insertion EXAM: CHEST - 2 VIEW COMPARISON:  10/22/2021 FINDINGS: Interval placement of right subclavian approach dual lead cardiac device with leads  projecting within the right atrium and right ventricle. Heart size within normal limits. Aortic atherosclerosis. No focal airspace consolidation, pleural effusion, or pneumothorax. IMPRESSION: Interval placement of right subclavian approach pacemaker. No pneumothorax. Electronically Signed   By: Davina Poke D.O.   On: 04/02/2022 09:30   EP PPM/ICD IMPLANT  Result Date: 04/01/2022  CONCLUSIONS:  1. 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 Dual chamber permanent pacemaker with left bundle area lead  3.  No early apparent complications.  4. Hold anticoagulation for 5 days. (Restart 04/07/2022)   ECHOCARDIOGRAM COMPLETE  Result Date: 04/01/2022    ECHOCARDIOGRAM REPORT   Patient Name:   Christy [name] Date of Exam: 03/31/2022 Medical Rec #:  [id number]      Height:       67.5 in Accession #:    [id number]     Weight:       146.0 lb Date of Birth:  1941-02-26      BSA:          1.779 m Patient Age:    13 years       BP:           150/68 mmHg Patient Gender: F              HR:           56 bpm. Exam Location:  Inpatient Procedure: 2D Echo, Cardiac Doppler and Color Doppler Indications:    Abnormal ECG  History:        Patient has prior history of Echocardiogram examinations, most                 recent 05/01/2011. Risk Factors:Hypertension and Dyslipidemia.  Sonographer:    Jefferey Pica Referring Phys: [id number] Gratz  1. Left ventricular ejection fraction, by estimation, is 60 to 65%. The left ventricle has normal function. The left ventricle has no regional wall motion abnormalities. There is mild concentric left ventricular hypertrophy. Left ventricular diastolic parameters are consistent with Grade II diastolic dysfunction (pseudonormalization).  2. Right ventricular systolic function is normal. The right ventricular size is normal. There is normal pulmonary artery systolic pressure.  3. Left atrial size was severely dilated.  4. Right atrial size was severely  dilated.  5. The mitral valve is abnormal. Mild to moderate mitral valve regurgitation. No evidence of mitral stenosis. There is mild holosystolic prolapse of anterior leaflet of the mitral valve.  6. The aortic valve is tricuspid. Aortic valve regurgitation is mild. No aortic stenosis  is present.  7. The inferior vena cava is normal in size with greater than 50% respiratory variability, suggesting right atrial pressure of 3 mmHg. FINDINGS  Left Ventricle: Left ventricular ejection fraction, by estimation, is 60 to 65%. The left ventricle has normal function. The left ventricle has no regional wall motion abnormalities. The left ventricular internal cavity size was normal in size. There is  mild concentric left ventricular hypertrophy. Left ventricular diastolic parameters are consistent with Grade II diastolic dysfunction (pseudonormalization). Indeterminate filling pressures. Right Ventricle: The right ventricular size is normal. No increase in right ventricular wall thickness. Right ventricular systolic function is normal. There is normal pulmonary artery systolic pressure. The tricuspid regurgitant velocity is 2.41 m/s, and  with an assumed right atrial pressure of 3 mmHg, the estimated right ventricular systolic pressure is 25.8 mmHg. Left Atrium: Left atrial size was severely dilated. Right Atrium: Right atrial size was severely dilated. Pericardium: There is no evidence of pericardial effusion. Mitral Valve: The mitral valve is abnormal. There is mild holosystolic prolapse of anterior leaflet of the mitral valve. There is moderate thickening of the mitral valve leaflet(s). Mild to moderate mitral valve regurgitation. No evidence of mitral valve  stenosis. Tricuspid Valve: The tricuspid valve is normal in structure. Tricuspid valve regurgitation is trivial. No evidence of tricuspid stenosis. Aortic Valve: The aortic valve is tricuspid. Aortic valve regurgitation is mild. No aortic stenosis is present. Aortic  valve peak gradient measures 5.4 mmHg. Pulmonic Valve: The pulmonic valve was normal in structure. Pulmonic valve regurgitation is trivial. No evidence of pulmonic stenosis. Aorta: The aortic root is normal in size and structure. Venous: The inferior vena cava is normal in size with greater than 50% respiratory variability, suggesting right atrial pressure of 3 mmHg. IAS/Shunts: No atrial level shunt detected by color flow Doppler.  LEFT VENTRICLE PLAX 2D LVIDd:         4.50 cm   Diastology LVIDs:         3.00 cm   LV e' medial:    4.25 cm/s LV PW:         1.20 cm   LV E/e' medial:  18.4 LV IVS:        1.10 cm   LV e' lateral:   6.68 cm/s LVOT diam:     1.90 cm   LV E/e' lateral: 11.7 LV SV:         55 LV SV Index:   31 LVOT Area:     2.84 cm  RIGHT VENTRICLE             IVC RV Basal diam:  3.90 cm     IVC diam: 1.90 cm RV Mid diam:    3.40 cm RV S prime:     17.70 cm/s TAPSE (M-mode): 3.0 cm LEFT ATRIUM              Index        RIGHT ATRIUM           Index LA diam:        4.70 cm  2.64 cm/m   RA Area:     27.90 cm LA Vol (A2C):   130.0 ml 73.08 ml/m  RA Volume:   85.70 ml  48.18 ml/m LA Vol (A4C):   103.0 ml 57.90 ml/m LA Biplane Vol: 116.0 ml 65.21 ml/m  AORTIC VALVE                 PULMONIC VALVE AV Area (Vmax):  2.38 cm     PV Vmax:       0.69 m/s AV Vmax:        116.00 cm/s  PV Peak grad:  1.9 mmHg AV Peak Grad:   5.4 mmHg LVOT Vmax:      97.40 cm/s LVOT Vmean:     55.500 cm/s LVOT VTI:       0.194 m  AORTA Ao Root diam: 3.60 cm Ao Asc diam:  3.20 cm MITRAL VALVE               TRICUSPID VALVE MV Area (PHT): 3.60 cm    TR Peak grad:   23.2 mmHg MV Decel Time: 211 msec    TR Vmax:        241.00 cm/s MR Peak grad: 140.7 mmHg MR Vmax:      593.00 cm/s  SHUNTS MV E velocity: 78.10 cm/s  Systemic VTI:  0.19 m MV A velocity: 49.30 cm/s  Systemic Diam: 1.90 cm MV E/A ratio:  1.58 Skeet Latch MD Electronically signed by Skeet Latch MD Signature Date/Time: 04/01/2022/6:26:55 AM    Final    Disposition    Pt is being discharged home today in good condition.  Follow-up Plans & Appointments    You need to have lab drawn on 07/06/21 to check you potassium and magnesium - you may eat before you go.    No Eliquis until 04/07/22 so you will not bleed at the pacemaker site.  Resume as before on the 19th.   Pacemaker post op instructions given on AVS   Follow-up Information     Baldwin Jamaica, PA-C Follow up on 04/18/2022.   Specialty: Cardiology Why: at 8:50 AM  for pacer site check Contact information: [address] [place] [address] [phone]         Vickie Epley, MD Follow up on 07/13/2022.   Specialties: Cardiology, Radiology Why: at 3:00 PM Contact information: [place] [address] [phone]                   Discharge Medications   Allergies as of 04/02/2022       Reactions   Caffeine    Heart palpitations   Epinephrine Palpitations        Medication List     TAKE these medications    acetaminophen 325 MG tablet Commonly known as: TYLENOL Take 2 tablets (650 mg total) by mouth every 4 (four) hours as needed for headache or mild pain.   apixaban 5 MG Tabs tablet Commonly known as: Eliquis Take 1 tablet (5 mg total) by mouth 2 (two) times daily. Start taking on: April 07, 2022 What changed:  how much to take how to take this when to take this additional instructions These instructions start on April 07, 2022. If you are unsure what to do until then, ask your doctor or other care provider.   betamethasone valerate 0.1 % cream Commonly known as: VALISONE Apply 1 application topically 2 (two) times daily as needed (itching.).   cholecalciferol 25 MCG (1000 UNIT) tablet Commonly known as: VITAMIN D3 Take 1,000 Units by mouth in the morning.   dofetilide 250 MCG capsule Commonly known as: TIKOSYN TAKE 1 CAPSULE BY MOUTH TWICE DAILY IN THE MORNING AND DINNER FOR HEART What  changed:  how much to take how to take this when to take this additional instructions   furosemide 20 MG tablet Commonly known  as: LASIX Take 1 tablet (20 mg total) by mouth daily as needed for fluid or edema (weight gain of 3 lbs in 24 hrs or 5 lbs in 1 week).   loratadine 10 MG tablet Commonly known as: CLARITIN Take 10 mg by mouth in the morning.   losartan 25 MG tablet Commonly known as: COZAAR Take 25 mg by mouth in the morning.   Magnesium 250 MG Tabs Take 250 mg by mouth at bedtime.   pantoprazole 40 MG tablet Commonly known as: PROTONIX Take 40 mg by mouth 2 (two) times daily.   potassium chloride 10 MEQ tablet Commonly known as: KLOR-CON TAKE 1 TABLET(10 MEQ) BY MOUTH DAILY What changed: See the new instructions.   PROBIOTIC DAILY PO Take 1 tablet by mouth every morning. May take 2 tablets as needed   propranolol 20 MG tablet Commonly known as: INDERAL Take 1 tablet (20 mg total) by mouth 2 (two) times daily.           Outstanding Labs/Studies     Duration of Discharge Encounter   Greater than 30 minutes including physician time.  Signed, Cecilie Kicks, NP 04/02/2022, 10:54 AM

## 2022-04-04 ENCOUNTER — Encounter (HOSPITAL_COMMUNITY): Payer: Self-pay | Admitting: Cardiology

## 2022-04-04 ENCOUNTER — Telehealth: Payer: Self-pay | Admitting: *Deleted

## 2022-04-04 NOTE — Telephone Encounter (Signed)
Notified the patient of needed lab tomorrow 10/17.  Verbalized understanding and agreement.

## 2022-04-04 NOTE — Telephone Encounter (Signed)
-----   Message from Isaiah Serge, NP sent at 04/02/2022 10:45 AM EDT ----- Scheduling please arrange labs for 04/05/22 BMP and Mg+ I placed orders. On tikosyn  Triage please remind pt she needs labs done.  Discharged on 04/02/22  thank you

## 2022-04-05 ENCOUNTER — Ambulatory Visit: Payer: PPO | Attending: Cardiology

## 2022-04-05 DIAGNOSIS — Z5181 Encounter for therapeutic drug level monitoring: Secondary | ICD-10-CM

## 2022-04-05 DIAGNOSIS — E876 Hypokalemia: Secondary | ICD-10-CM

## 2022-04-05 LAB — BASIC METABOLIC PANEL
BUN/Creatinine Ratio: 29 — ABNORMAL HIGH (ref 12–28)
BUN: 27 mg/dL (ref 8–27)
CO2: 25 mmol/L (ref 20–29)
Calcium: 9.8 mg/dL (ref 8.7–10.3)
Chloride: 102 mmol/L (ref 96–106)
Creatinine, Ser: 0.92 mg/dL (ref 0.57–1.00)
Glucose: 150 mg/dL — ABNORMAL HIGH (ref 70–99)
Potassium: 4.8 mmol/L (ref 3.5–5.2)
Sodium: 140 mmol/L (ref 134–144)
eGFR: 62 mL/min/{1.73_m2} (ref >59)

## 2022-04-05 LAB — MAGNESIUM: Magnesium: 2.2 mg/dL (ref 1.6–2.3)

## 2022-04-06 MED FILL — Heparin Sod (Porcine)-NaCl IV Soln 1000 Unit/500ML-0.9%: INTRAVENOUS | Qty: 500 | Status: AC

## 2022-04-11 ENCOUNTER — Ambulatory Visit: Payer: PPO | Admitting: Cardiovascular Disease

## 2022-04-11 DIAGNOSIS — Z23 Encounter for immunization: Secondary | ICD-10-CM | POA: Diagnosis not present

## 2022-04-14 ENCOUNTER — Ambulatory Visit (HOSPITAL_COMMUNITY): Payer: PPO | Admitting: Nurse Practitioner

## 2022-04-14 DIAGNOSIS — F411 Generalized anxiety disorder: Secondary | ICD-10-CM | POA: Diagnosis not present

## 2022-04-14 DIAGNOSIS — R7309 Other abnormal glucose: Secondary | ICD-10-CM | POA: Diagnosis not present

## 2022-04-14 DIAGNOSIS — K219 Gastro-esophageal reflux disease without esophagitis: Secondary | ICD-10-CM | POA: Diagnosis not present

## 2022-04-14 DIAGNOSIS — Z853 Personal history of malignant neoplasm of breast: Secondary | ICD-10-CM | POA: Diagnosis not present

## 2022-04-14 DIAGNOSIS — Z131 Encounter for screening for diabetes mellitus: Secondary | ICD-10-CM | POA: Diagnosis not present

## 2022-04-14 DIAGNOSIS — I5030 Unspecified diastolic (congestive) heart failure: Secondary | ICD-10-CM | POA: Diagnosis not present

## 2022-04-14 DIAGNOSIS — Z86711 Personal history of pulmonary embolism: Secondary | ICD-10-CM | POA: Diagnosis not present

## 2022-04-14 DIAGNOSIS — D6869 Other thrombophilia: Secondary | ICD-10-CM | POA: Diagnosis not present

## 2022-04-14 DIAGNOSIS — I4891 Unspecified atrial fibrillation: Secondary | ICD-10-CM | POA: Diagnosis not present

## 2022-04-14 DIAGNOSIS — I10 Essential (primary) hypertension: Secondary | ICD-10-CM | POA: Diagnosis not present

## 2022-04-14 DIAGNOSIS — Z950 Presence of cardiac pacemaker: Secondary | ICD-10-CM | POA: Diagnosis not present

## 2022-04-17 NOTE — Progress Notes (Unsigned)
Cardiology Office Note Date:  04/18/2022  Patient ID:  Analy, [name] 02/19/1941, MRN [id number] PCP:  Wenda Low, MD  Electrophysiologist: Dr.  Rayann Heman >> Dr. Quentin Ore    Chief Complaint:  wound check  History of Present Illness: Christy [name] is a 81 y.o. female with history of AFib/flutter, HTN, breast cancer.  Admitted to [facility] 03/31/22, she saw Butch Penny on 03/29/22, she reported a syncopal event last week and a monitor was placed. In the clinic she was noted on ECG to have what appears junctional tachycardia 110's or so with rapid drop to SB 50's The AFib clinic got an alert that she had a 8 second pause, in d/w her she was awake and did get lightheaded with it. She reports an up-tick in her palpitations of late, feels like she is having more Afib as well,  has always been aware of the her heart rate/rhythm, with some mild fleeting dizziness, though last week was the 1st time she has fainted Underwent PPM 04/01/22 Discharged 04/02/22  TODAY She thinks she is healing well. She mentions that she has been quite anxious, [place] recently moved to [place] a cottage there, this has been a big [place] transition for her, and now having to get a PPM. Her PMD has prescribed something for her, but she has not picked it up, can't decide if she wants to try it or not.  No CP, no site concerns. She has had some Afib though. No near syncope or syncope. No SOB   No bleeding or signs of bleeding  Device information MDT dual chamber PPM implant RV lead in LB area   Past Medical History:  Diagnosis Date   Anxiety    Atrial fibrillation (HCC)    Bilateral lower extremity edema    Chronic, likely related to venous stasis    DJD (degenerative joint disease), lumbosacral    Dyslipidemia    Dysrhythmia    A fib   Essential hypertension    "dx'd recently" (10/06/2016)   GERD (gastroesophageal reflux disease)    History of hepatitis C virus infection ~ 1997   In remission    Lung granuloma ([place]) 2004   CT scan   Mitral regurgitation    04/30/21 echo: bileaflet prolapse with moderate MR   Osteopenia    Paroxysmal atrial flutter (HCC) 04/02/2022   Paroxysmal atrial tachycardia    PE (pulmonary embolism) 01/2016   PONV (postoperative nausea and vomiting)    Patient states she had extremem nausea after neck surgery several years ago   Spondylolisthesis    Status post epidural injections 3 by neurosurgery  - Dr. Joya Salm    Varicose veins of both legs with edema    Vitamin D deficiency     Past Surgical History:  Procedure Laterality Date   ATRIAL FIBRILLATION ABLATION N/A 03/09/2017   Procedure: Atrial Fibrillation Ablation;  Surgeon: [name], MD;  Location: [place] CV [facility];  Service: Cardiovascular;  Laterality: N/A;   BACK SURGERY     BREAST LUMPECTOMY WITH RADIOACTIVE SEED LOCALIZATION Left 08/12/2021   Procedure: LEFT BREAST LUMPECTOMY WITH RADIOACTIVE SEED LOCALIZATION;  Surgeon: Donnie Mesa, MD;  Location: [place];  Service: General;  Laterality: Left;   CARDIOVERSION N/A 03/21/2016   Procedure: CARDIOVERSION;  Surgeon: [name], MD;  Location: [place];  Service: Cardiovascular;  Laterality: N/A;   COMBINED HYSTEROSCOPY DIAGNOSTIC / D&C  2005   COMBINED HYSTEROSCOPY DIAGNOSTIC / D&C  1999   DILATION  AND CURETTAGE OF UTERUS     implantable loop recorder removal  11/10/2021   LOOP RECORDER INSERTION N/A 10/31/2017   Procedure: LOOP RECORDER INSERTION;  Surgeon: [name], MD;  Location: [place] CV LAB;  Service: Cardiovascular;  Laterality: N/A;   [place]   "went in front and back; broken neck"   PACEMAKER IMPLANT N/A 04/01/2022   Procedure: PACEMAKER IMPLANT;  Surgeon: Vickie Epley, MD;  Location: [place] CV LAB;  Service: Cardiovascular;  Laterality: N/A;   RE-EXCISION OF BREAST CANCER,SUPERIOR MARGINS Left 08/25/2021   Procedure: RE-EXCISION OF SUPERIOR MARGIN -  LEFT BREAST LUMPECTOMY;   Surgeon: Donnie Mesa, MD;  Location: [place];  Service: General;  Laterality: Left;   TEE WITHOUT CARDIOVERSION N/A 03/08/2017   Procedure: TRANSESOPHAGEAL ECHOCARDIOGRAM (TEE);  Surgeon: Josue Hector, MD;  Location: [facility] END[facility]COPY;  Service: Cardiovascular;  Laterality: N/A;   TOE SURGERY Right    replaced joint   TONSILLECTOMY     TUBAL LIGATION Bilateral 1976   VAGINAL DELIVERY     x2    Current Outpatient Medications  Medication Sig Dispense Refill   acetaminophen (TYLENOL) 325 MG tablet Take 2 tablets (650 mg total) by mouth every 4 (four) hours as needed for headache or mild pain.     apixaban (ELIQUIS) 5 MG TABS tablet Take 1 tablet (5 mg total) by mouth 2 (two) times daily.     betamethasone valerate (VALISONE) 0.1 % cream Apply 1 application topically 2 (two) times daily as needed (itching.).     cholecalciferol (VITAMIN D3) 25 MCG (1000 UNIT) tablet Take 1,000 Units by mouth in the morning.     dofetilide (TIKOSYN) 250 MCG capsule TAKE 1 CAPSULE BY MOUTH TWICE DAILY IN THE MORNING AND DINNER FOR HEART (Patient taking differently: Take 250 mcg by mouth 2 (two) times daily.) 180 capsule 1   furosemide (LASIX) 20 MG tablet Take 1 tablet (20 mg total) by mouth daily as needed for fluid or edema (weight gain of 3 lbs in 24 hrs or 5 lbs in 1 week). 30 tablet 3   loratadine (CLARITIN) 10 MG tablet Take 10 mg by mouth in the morning.     losartan (COZAAR) 25 MG tablet Take 25 mg by mouth in the morning.     Magnesium 250 MG TABS Take 250 mg by mouth at bedtime.     pantoprazole (PROTONIX) 40 MG tablet Take 40 mg by mouth 2 (two) times daily.     potassium chloride (KLOR-CON) 10 MEQ tablet TAKE 1 TABLET(10 MEQ) BY MOUTH DAILY (Patient taking differently: Take 10 mEq by mouth once.) 30 tablet 6   Probiotic Product (PROBIOTIC DAILY PO) Take 1 tablet by mouth every morning. May take 2 tablets as needed     propranolol (INDERAL) 20 MG tablet Take 1 tablet (20 mg total) by mouth 2 (two) times  daily. 180 tablet 3   No current facility-administered medications for this visit.    Allergies:   Caffeine and Epinephrine   Social History:  The patient  reports that she has never smoked. She has never used smokeless tobacco. She reports current alcohol use of about 14.0 standard drinks of alcohol per week. She reports that she does not use drugs.   Family History:  The patient's family history includes CVA in her father and mother; Heart Problems in her mother; Heart attack (age of onset: 28) in her mother.  ROS:  Please see the history of present illness.  All other systems are reviewed and otherwise negative.   PHYSICAL EXAM:  VS:  BP 110/80   Pulse 78   Ht 5' 7.75" (1.721 m)   Wt 149 lb 3.2 oz (67.7 kg)   SpO2 97%   BMI 22.85 kg/m  BMI: Body mass index is 22.85 kg/m. Well nourished, well developed, in no acute distress HEENT: normocephalic, atraumatic Neck: no JVD, carotid bruits or masses Cardiac:  RRR; no significant murmurs, no rubs, or gallops Lungs:  CTA b/l, no wheezing, rhonchi or rales Abd: soft, nontender MS: no deformity or atrophy Ext: no edema Skin: warm and dry, no rash Neuro:  No gross deficits appreciated Psych: euthymic mood, full affect  PPM site: steri strips are removed without difficulty.  Wound edges are well approximated, no erythema, edema, fluctuation or heat, not tender.   EKG:  done today and reviewed by myself AP/VS 78bpm  Device interrogation done today and reviewed by myself:  Battery and lead measurements are good + AFib, longest about 3 hours   03/31/2022: TTE  1. Left ventricular ejection fraction, by estimation, is 60 to 65%. The  left ventricle has normal function. The left ventricle has no regional  wall motion abnormalities. There is mild concentric left ventricular  hypertrophy. Left ventricular diastolic  parameters are consistent with Grade II diastolic dysfunction  (pseudonormalization).   2. Right ventricular  systolic function is normal. The right ventricular  size is normal. There is normal pulmonary artery systolic pressure.   3. Left atrial size was severely dilated.   4. Right atrial size was severely dilated.   5. The mitral valve is abnormal. Mild to moderate mitral valve  regurgitation. No evidence of mitral stenosis. There is mild holosystolic  prolapse of anterior leaflet of the mitral valve.   6. The aortic valve is tricuspid. Aortic valve regurgitation is mild. No  aortic stenosis is present.   7. The inferior vena cava is normal in size with greater than 50%  respiratory variability, suggesting right atrial pressure of 3 mmHg.   04/30/2021: TTE 1. Left ventricular ejection fraction, by estimation, is 65 to 70%. The  left ventricle has normal function. The left ventricle has no regional  wall motion abnormalities. There is moderate asymmetric left ventricular  hypertrophy of the basal-septal  segment. Left ventricular diastolic parameters were normal.   2. Right ventricular systolic function is normal. The right ventricular  size is mildly enlarged. There is mildly elevated pulmonary artery  systolic pressure. The estimated right ventricular systolic pressure is  94.1 mmHg.   3. Left atrial size was severely dilated.   4. Right atrial size was severely dilated.   5. The mitral valve is degenerative. Bileaflet prolapse. Moderate mitral  valve regurgitation.   6. Pulmonic valve regurgitation is mild to moderate.   7. The aortic valve is tricuspid. Aortic valve regurgitation is trivial.  No aortic stenosis is present.   Recent Labs: 07/28/2021: ALT 11 03/31/2022: Hemoglobin 13.3; Platelets 208 04/05/2022: BUN 27; Creatinine, Ser 0.92; Magnesium 2.2; Potassium 4.8; Sodium 140  No results found for requested labs within last 365 days.   Estimated Creatinine Clearance: 46.3 mL/min (by C-G formula based on SCr of 0.92 mg/dL).   Wt Readings from Last 3 Encounters:  04/18/22 149 lb  3.2 oz (67.7 kg)  03/31/22 146 lb 8 oz (66.5 kg)  03/29/22 148 lb 9.6 oz (67.4 kg)     Other studies reviewed: Additional studies/records reviewed today include: summarized above  ASSESSMENT AND  PLAN:  Symptomatic bradycardia/pauses Tachy-brady syndrome 3.    PPM Site is well healed Discussed remaining arm restrictions She is programmed MVP AP 76%, VP 0.8%  Discussed hopefully as time goes by she will feel more comfortable with her device.    4. Paroxysmal AFib 5. Atypical AFlutter CHA2DSVasc is 5, on Eliquis,  appropriately dosed Tikosyn, QTc is OK follow burden via device   Increased personal stressors/anxiety, she is having some Afib, perhaps 2/2 this Discussed importance of checking meds with the pharmacist before starting anything new with her Tikosyn I have asked that she call us with the name of the prescribed medication she was written for  Labs looked ok  6. HTN Looks great  Disposition: F/u with Dr. Quentin Ore as scheduled, sooner if needed  Current medicines are reviewed at length with the patient today.  The patient did not have any concerns regarding medicines.  Venetia Night, PA-C 04/18/2022 10:05 AM     Christy [name]  [place] [address] [phone] (office)  [phone] (fax)

## 2022-04-18 ENCOUNTER — Ambulatory Visit: Payer: PPO | Attending: Cardiovascular Disease | Admitting: Physician Assistant

## 2022-04-18 ENCOUNTER — Telehealth: Payer: Self-pay | Admitting: Physician Assistant

## 2022-04-18 ENCOUNTER — Encounter: Payer: Self-pay | Admitting: Physician Assistant

## 2022-04-18 VITALS — BP 110/80 | HR 78 | Ht 67.75 in | Wt 149.2 lb

## 2022-04-18 DIAGNOSIS — Z5181 Encounter for therapeutic drug level monitoring: Secondary | ICD-10-CM | POA: Diagnosis not present

## 2022-04-18 DIAGNOSIS — Z79899 Other long term (current) drug therapy: Secondary | ICD-10-CM

## 2022-04-18 DIAGNOSIS — I480 Paroxysmal atrial fibrillation: Secondary | ICD-10-CM

## 2022-04-18 DIAGNOSIS — I10 Essential (primary) hypertension: Secondary | ICD-10-CM

## 2022-04-18 DIAGNOSIS — Z5189 Encounter for other specified aftercare: Secondary | ICD-10-CM | POA: Diagnosis not present

## 2022-04-18 DIAGNOSIS — Z950 Presence of cardiac pacemaker: Secondary | ICD-10-CM | POA: Diagnosis not present

## 2022-04-18 DIAGNOSIS — I4892 Unspecified atrial flutter: Secondary | ICD-10-CM

## 2022-04-18 LAB — CUP PACEART INCLINIC DEVICE CHECK
Battery Remaining Longevity: 152 mo
Battery Voltage: 3.21 V
Brady Statistic AP VP Percent: 0.1 %
Brady Statistic AP VS Percent: 82.09 %
Brady Statistic AS VP Percent: 0.05 %
Brady Statistic AS VS Percent: 17.78 %
Brady Statistic RA Percent Paced: 76.08 %
Brady Statistic RV Percent Paced: 0.76 %
Date Time Interrogation Session: 20231030125127
Implantable Lead Connection Status: 753985
Implantable Lead Connection Status: 753985
Implantable Lead Implant Date: 20231013
Implantable Lead Implant Date: 20231013
Implantable Lead Location: 753859
Implantable Lead Location: 753860
Implantable Lead Model: 3830
Implantable Lead Model: 5076
Implantable Pulse Generator Implant Date: 20231013
Lead Channel Impedance Value: 380 Ohm
Lead Channel Impedance Value: 456 Ohm
Lead Channel Impedance Value: 475 Ohm
Lead Channel Impedance Value: 665 Ohm
Lead Channel Pacing Threshold Amplitude: 0.625 V
Lead Channel Pacing Threshold Amplitude: 0.75 V
Lead Channel Pacing Threshold Pulse Width: 0.4 ms
Lead Channel Pacing Threshold Pulse Width: 0.4 ms
Lead Channel Sensing Intrinsic Amplitude: 1.25 mV
Lead Channel Sensing Intrinsic Amplitude: 2.375 mV
Lead Channel Sensing Intrinsic Amplitude: 5.75 mV
Lead Channel Sensing Intrinsic Amplitude: 7.5 mV
Lead Channel Setting Pacing Amplitude: 3.5 V
Lead Channel Setting Pacing Amplitude: 3.5 V
Lead Channel Setting Pacing Pulse Width: 0.4 ms
Lead Channel Setting Sensing Sensitivity: 1.2 mV
Zone Setting Status: 755011
Zone Setting Status: 755011

## 2022-04-18 NOTE — Telephone Encounter (Signed)
Returned call to patient.  Patient states she was advised to call and inform us of what medication she was to start taking.  Patient would like to know if Buspirone '5mg'$  QD is OK to take while on Tikosyn 256mg BID.  Will forward to RTommye Standard PA-C and PharmD to review and advise.

## 2022-04-18 NOTE — Addendum Note (Signed)
Encounter addended by: Juluis Mire, RN on: 04/18/2022 2:25 PM  Actions taken: Imaging Exam ended

## 2022-04-18 NOTE — Telephone Encounter (Signed)
Pt c/o medication issue:  1. Name of Medication: Buspirone 5 mg   2. How are you currently taking this medication (dosage and times per day)? Isn't taking yet. Was told to take 1x daily   3. Are you having a reaction (difficulty breathing--STAT)?   4. What is your medication issue?  Pt was told to call back with the name of a medication she was told to start taking from another doctor to make sure it is safe for her take. Please advise.

## 2022-04-18 NOTE — Patient Instructions (Addendum)
Medication Instructions:    Your physician recommends that you continue on your current medications as directed. Please refer to the Current Medication list given to you today.  ( PLEASE CALL WITH NEW MEDICATION YOUR ARE TAKING )  *If you need a refill on your cardiac medications before your next appointment, please call your pharmacy*   Lab Work:  NONE ORDERED  TODAY    If you have labs (blood work) drawn today and your tests are completely normal, you will receive your results only by: MyChart Message (if you have MyChart) OR A paper copy in the mail If you have any lab test that is abnormal or we need to change your treatment, we will call you to review the results.   Testing/Procedures: NONE ORDERED  TODAY   Follow-Up: At [facility], you and your health needs are our priority.  As part of our continuing mission to provide you with exceptional heart care, we have created designated Provider Care Teams.  These Care Teams include your primary Cardiologist (physician) and Advanced Practice Providers (APPs -  Physician Assistants and Nurse Practitioners) who all work together to provide you with the care you need, when you need it.  We recommend signing up for the patient portal called "MyChart".  Sign up information is provided on this After Visit Summary.  MyChart is used to connect with patients for Virtual Visits (Telemedicine).  Patients are able to view lab/test results, encounter notes, upcoming appointments, etc.  Non-urgent messages can be sent to your provider as well.   To learn more about what you can do with MyChart, go to [email or web address].    Your next appointment:  AS SCHEDULED IN PERSON   The format for your next appointment:   In Person  Provider:   Lars Mage, MD   Other Instructions  Important Information About Sugar

## 2022-04-19 NOTE — Telephone Encounter (Signed)
No interactions between Tikosyn and buspirone

## 2022-04-19 NOTE — Telephone Encounter (Signed)
Patient called stating she had not got a response and is following-up on whether the medication for anxiety is compatible with Tikosyn.

## 2022-04-20 NOTE — Telephone Encounter (Signed)
Spoke with the patient and made her aware that there is no interactions between Tikosyn and buspirone. Patient verbalized understanding.

## 2022-04-27 ENCOUNTER — Telehealth (HOSPITAL_COMMUNITY): Payer: Self-pay | Admitting: *Deleted

## 2022-04-27 NOTE — Telephone Encounter (Signed)
Patient called in stating she has noticed her HRs have been elevated since Monday afternoon - Pt is going to send transmission to confirm rhythm. Will be in touch with patient for further instruction once rhythm known. Pt verbalized agreement.

## 2022-04-28 MED ORDER — DILTIAZEM HCL ER COATED BEADS 120 MG PO CP24: 120.0000 mg | ORAL_CAPSULE | Freq: Every day | ORAL | 3 refills | Status: DC

## 2022-04-28 NOTE — Telephone Encounter (Signed)
Patient back in NSR this morning - per Roderic Palau NP will start cardizem '120mg'$  once a day at bedtime to help with increased afib burden. Pt will call with update of response in a week. Pt offered sooner appt with Quentin Ore with inc in burden but pt declined.

## 2022-05-09 ENCOUNTER — Telehealth: Payer: Self-pay | Admitting: Cardiology

## 2022-05-09 NOTE — Telephone Encounter (Signed)
Pt calling wanting to speak with RN , she has a couple of questions about what she can and can't do since she got her pacemaker implanted.

## 2022-05-09 NOTE — Telephone Encounter (Signed)
Patient had questions in regard to ROM. Advised patient she is cleared to continue yoga and sleeping on her left side after Friday 05/13/22. Patient voiced understanding.

## 2022-06-01 ENCOUNTER — Telehealth (HOSPITAL_COMMUNITY): Payer: Self-pay | Admitting: *Deleted

## 2022-06-01 MED ORDER — DILTIAZEM HCL 30 MG PO TABS: ORAL_TABLET | ORAL | 1 refills | Status: AC

## 2022-06-01 NOTE — Telephone Encounter (Signed)
Patient called in stating she is having breakthrough afib HRs in the 120s lasting 5-7 hours. BP currently 147/90. Pt very anxious of what to do. Discussed with Roderic Palau NP will give PRN cardizem '30mg'$  every 4hours PRN for rates over 100. Educated pt on when/how to use. Pt verbalized agreement. If having to take frequently she will let us know as her daily medication regimen may need to be adjusted at that point.

## 2022-07-01 ENCOUNTER — Telehealth: Payer: Self-pay | Admitting: Cardiology

## 2022-07-01 DIAGNOSIS — Z7901 Long term (current) use of anticoagulants: Secondary | ICD-10-CM | POA: Diagnosis not present

## 2022-07-01 DIAGNOSIS — T148XXA Other injury of unspecified body region, initial encounter: Secondary | ICD-10-CM | POA: Diagnosis not present

## 2022-07-01 NOTE — Telephone Encounter (Signed)
Will send message to A. FIB clinic nurse.

## 2022-07-01 NOTE — Telephone Encounter (Signed)
Pt states area is size of silver dollar blue in the center hard but soft on the outside of the area. Does not remember hitting her arm on anything yesterday. Pt states there is a second area below that which is smaller but notices blue area there as well. Discussed with Adline Peals PA recommends contacting PCP to assess area - watch for increase in size of area. Can apply ice to area to help with swelling. Pt states she will call her PCP.

## 2022-07-01 NOTE — Telephone Encounter (Signed)
Pt c/o medication issue:  1. Name of Medication:  apixaban (ELIQUIS) 5 MG TABS tablet  2. How are you currently taking this medication (dosage and times per day)?   3. Are you having a reaction (difficulty breathing--STAT)?   4. What is your medication issue?   Patient states last night she noticed a knot on her forearm. This morning she went and saw her neighbor who is a doctor. She mentions that his wife is also a Marine scientist and they informed her that it looks like a hematoma and recommended getting advisement on her Eliquis.

## 2022-07-05 ENCOUNTER — Ambulatory Visit: Payer: PPO | Attending: Cardiology

## 2022-07-05 DIAGNOSIS — I4719 Other supraventricular tachycardia: Secondary | ICD-10-CM | POA: Diagnosis not present

## 2022-07-05 LAB — CUP PACEART REMOTE DEVICE CHECK
Battery Remaining Longevity: 144 mo
Battery Voltage: 3.18 V
Brady Statistic AP VP Percent: 1.25 %
Brady Statistic AP VS Percent: 81.4 %
Brady Statistic AS VP Percent: 3.16 %
Brady Statistic AS VS Percent: 14.19 %
Brady Statistic RA Percent Paced: 80.64 %
Brady Statistic RV Percent Paced: 5.05 %
Date Time Interrogation Session: 20240116071831
Implantable Lead Connection Status: 753985
Implantable Lead Connection Status: 753985
Implantable Lead Implant Date: 20231013
Implantable Lead Implant Date: 20231013
Implantable Lead Location: 753859
Implantable Lead Location: 753860
Implantable Lead Model: 3830
Implantable Lead Model: 5076
Implantable Pulse Generator Implant Date: 20231013
Lead Channel Impedance Value: 323 Ohm
Lead Channel Impedance Value: 399 Ohm
Lead Channel Impedance Value: 437 Ohm
Lead Channel Impedance Value: 589 Ohm
Lead Channel Pacing Threshold Amplitude: 0.625 V
Lead Channel Pacing Threshold Amplitude: 0.75 V
Lead Channel Pacing Threshold Pulse Width: 0.4 ms
Lead Channel Pacing Threshold Pulse Width: 0.4 ms
Lead Channel Sensing Intrinsic Amplitude: 0.375 mV
Lead Channel Sensing Intrinsic Amplitude: 0.375 mV
Lead Channel Sensing Intrinsic Amplitude: 3.75 mV
Lead Channel Sensing Intrinsic Amplitude: 3.75 mV
Lead Channel Setting Pacing Amplitude: 3.5 V
Lead Channel Setting Pacing Amplitude: 3.5 V
Lead Channel Setting Pacing Pulse Width: 0.4 ms
Lead Channel Setting Sensing Sensitivity: 1.2 mV
Zone Setting Status: 755011
Zone Setting Status: 755011

## 2022-07-11 ENCOUNTER — Other Ambulatory Visit (HOSPITAL_COMMUNITY): Payer: Self-pay | Admitting: Nurse Practitioner

## 2022-07-13 ENCOUNTER — Ambulatory Visit: Payer: PPO | Attending: Cardiology | Admitting: Cardiology

## 2022-07-13 ENCOUNTER — Encounter: Payer: Self-pay | Admitting: Cardiology

## 2022-07-13 VITALS — BP 140/72 | HR 72 | Ht 67.75 in | Wt 155.0 lb

## 2022-07-13 DIAGNOSIS — N182 Chronic kidney disease, stage 2 (mild): Secondary | ICD-10-CM | POA: Diagnosis not present

## 2022-07-13 DIAGNOSIS — R55 Syncope and collapse: Secondary | ICD-10-CM | POA: Diagnosis not present

## 2022-07-13 DIAGNOSIS — I480 Paroxysmal atrial fibrillation: Secondary | ICD-10-CM

## 2022-07-13 DIAGNOSIS — D6869 Other thrombophilia: Secondary | ICD-10-CM | POA: Diagnosis not present

## 2022-07-13 DIAGNOSIS — I5030 Unspecified diastolic (congestive) heart failure: Secondary | ICD-10-CM | POA: Diagnosis not present

## 2022-07-13 DIAGNOSIS — I10 Essential (primary) hypertension: Secondary | ICD-10-CM | POA: Diagnosis not present

## 2022-07-13 DIAGNOSIS — Z86711 Personal history of pulmonary embolism: Secondary | ICD-10-CM | POA: Diagnosis not present

## 2022-07-13 DIAGNOSIS — Z950 Presence of cardiac pacemaker: Secondary | ICD-10-CM | POA: Diagnosis not present

## 2022-07-13 DIAGNOSIS — Z79899 Other long term (current) drug therapy: Secondary | ICD-10-CM

## 2022-07-13 DIAGNOSIS — I4891 Unspecified atrial fibrillation: Secondary | ICD-10-CM | POA: Diagnosis not present

## 2022-07-13 DIAGNOSIS — S40029A Contusion of unspecified upper arm, initial encounter: Secondary | ICD-10-CM | POA: Diagnosis not present

## 2022-07-13 DIAGNOSIS — Z853 Personal history of malignant neoplasm of breast: Secondary | ICD-10-CM | POA: Diagnosis not present

## 2022-07-13 NOTE — Patient Instructions (Signed)
Medication Instructions:  Your physician recommends that you continue on your current medications as directed. Please refer to the Current Medication list given to you today.  *If you need a refill on your cardiac medications before your next appointment, please call your pharmacy*  Lab Work: TODAY: BMET and Mag If you have labs (blood work) drawn today and your tests are completely normal, you will receive your results only by: [place] (if you have MyChart) OR A paper copy in the mail If you have any lab test that is abnormal or we need to change your treatment, we will call you to review the results.   Follow-Up: At [facility], you and your health needs are our priority.  As part of our continuing mission to provide you with exceptional heart care, we have created designated Provider Care Teams.  These Care Teams include your primary Cardiologist (physician) and Advanced Practice Providers (APPs -  Physician Assistants and Nurse Practitioners) who all work together to provide you with the care you need, when you need it.  Your next appointment:   3 month(s)  Provider:   You will see one of the following Advanced Practice Providers on your designated Care Team:   Tommye Standard, [place]" Merlin, [place], NP

## 2022-07-13 NOTE — Progress Notes (Deleted)
Electrophysiology Office Follow up Visit Note:    Date:  07/13/2022   ID:  Christy [name], DOB Dec 29, 1940, MRN [id number]  PCP:  Wenda Low, MD  [facility] Cardiologist:  None  [facility] Electrophysiologist:  None    Interval History:    Christy [name] is a 82 y.o. female who presents for a follow up visit.  She had a pacemaker implanted April 01, 2022 for symptomatic sinus node dysfunction and pauses.  She is tolerated and follow-up on April 18, 2022 and was doing well. She is on Tikosyn to help manage her atrial fibrillation and flutters.  She takes Eliquis for stroke prophylaxis.      Past Medical History:  Diagnosis Date   Anxiety    Atrial fibrillation ([place])    Bilateral lower extremity edema    Chronic, likely related to venous stasis    DJD (degenerative joint disease), lumbosacral    Dyslipidemia    Dysrhythmia    A fib   Essential hypertension    "dx'd recently" (10/06/2016)   GERD (gastroesophageal reflux disease)    History of hepatitis C virus infection ~ 1997   In remission   Lung granuloma ([place]) 2004   CT scan   Mitral regurgitation    04/30/21 echo: bileaflet prolapse with moderate MR   Osteopenia    Paroxysmal atrial flutter ([place]) 04/02/2022   Paroxysmal atrial tachycardia    PE (pulmonary embolism) 01/2016   PONV (postoperative nausea and vomiting)    Patient states she had extremem nausea after neck surgery several years ago   Spondylolisthesis    Status post epidural injections 3 by neurosurgery  - Dr. Joya Salm    Varicose veins of both legs with edema    Vitamin D deficiency     Past Surgical History:  Procedure Laterality Date   ATRIAL FIBRILLATION ABLATION N/A 03/09/2017   Procedure: Atrial Fibrillation Ablation;  Surgeon: [name], MD;  Location: [place] [facility];  Service: Cardiovascular;  Laterality: N/A;   BACK SURGERY     BREAST LUMPECTOMY WITH RADIOACTIVE SEED LOCALIZATION Left 08/12/2021   Procedure: LEFT BREAST  LUMPECTOMY WITH RADIOACTIVE SEED LOCALIZATION;  Surgeon: Donnie Mesa, MD;  Location: [place];  Service: General;  Laterality: Left;   CARDIOVERSION N/A 03/21/2016   Procedure: CARDIOVERSION;  Surgeon: [name], MD;  Location: [place];  Service: Cardiovascular;  Laterality: N/A;   COMBINED HYSTEROSCOPY DIAGNOSTIC / D&C  2005   COMBINED HYSTEROSCOPY DIAGNOSTIC / D&C  1999   DILATION AND CURETTAGE OF UTERUS     implantable loop recorder removal  11/10/2021   LOOP RECORDER INSERTION N/A 10/31/2017   Procedure: LOOP RECORDER INSERTION;  Surgeon: [name], MD;  Location: [place] [facility];  Service: Cardiovascular;  Laterality: N/A;   [place]   "went in front and back; broken neck"   PACEMAKER IMPLANT N/A 04/01/2022   Procedure: PACEMAKER IMPLANT;  Surgeon: Vickie Epley, MD;  Location: [place] [facility];  Service: Cardiovascular;  Laterality: N/A;   RE-EXCISION OF BREAST CANCER,SUPERIOR MARGINS Left 08/25/2021   Procedure: RE-EXCISION OF SUPERIOR MARGIN -  LEFT BREAST LUMPECTOMY;  Surgeon: Donnie Mesa, MD;  Location: [place];  Service: General;  Laterality: Left;   TEE WITHOUT CARDIOVERSION N/A 03/08/2017   Procedure: TRANSESOPHAGEAL ECHOCARDIOGRAM (TEE);  Surgeon: Josue Hector, MD;  Location: [facility];  Service: Cardiovascular;  Laterality: N/A;   TOE SURGERY Right    replaced joint   TONSILLECTOMY  TUBAL LIGATION Bilateral 1976   VAGINAL DELIVERY     x2    Current Medications: No outpatient medications have been marked as taking for the 07/13/22 encounter (Appointment) with Vickie Epley, MD.     Allergies:   Caffeine and Epinephrine   Social History   Socioeconomic History   Marital status: Married    Spouse name: Not on file   Number of children: 2   Years of education: Not on file   Highest education level: Not on file  Occupational History   Not on file  Tobacco Use   Smoking status: Never   Smokeless tobacco: Never   Vaping Use   Vaping Use: Never used  Substance and Sexual Activity   Alcohol use: Yes    Alcohol/week: 14.0 standard drinks of alcohol    Types: 14 Glasses of wine per week    Comment: occasional   Drug use: No   Sexual activity: Yes    Birth control/protection: Other-see comments    Comment: tubal ligation  Other Topics Concern   Not on file  Social History Narrative   She is a married mother of 1. Lives with her husband. 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 She has never smoked and drinks up to 10 glasses of wine or so week.   She usually exercises roughly 3 days a week doing yoga and plays golf. She is mostly limited by her "time constraints "   Social Determinants of Health   Financial Resource Strain: Not on file  Food Insecurity: No Food Insecurity (03/31/2022)   Hunger Vital Sign    Worried About Running Out of Food in the Last Year: Never true    Ran Out of Food in the Last Year: Never true  Transportation Needs: No Transportation Needs (03/31/2022)   PRAPARE - Hydrologist (Medical): No    Lack of Transportation (Non-Medical): No  Physical Activity: Not on file  Stress: Not on file  Social Connections: Not on file     Family History: The patient's family history includes CVA in her father and mother; Heart Problems in her mother; Heart attack (age of onset: 36) in her mother. There is no history of Colon cancer or Clotting disorder.  ROS:   Please see the history of present illness.    All other systems reviewed and are negative.  EKGs/Labs/Other Studies Reviewed:    The following studies were reviewed today:  July 13, 2022 in clinic device interrogation personally reviewed ***  EKG:  The ekg ordered today demonstrates ***  Recent Labs: 07/28/2021: ALT 11 03/31/2022: Hemoglobin 13.3; Platelets 208 04/05/2022: BUN 27; Creatinine, Ser 0.92; Magnesium 2.2; Potassium 4.8; Sodium 140  Recent Lipid Panel No results  found for: "CHOL", "TRIG", "HDL", "CHOLHDL", "VLDL", "LDLCALC", "LDLDIRECT"  Physical Exam:    VS:  There were no vitals taken for this visit.    Wt Readings from Last 3 Encounters:  04/18/22 149 lb 3.2 oz (67.7 kg)  03/31/22 146 lb 8 oz (66.5 kg)  03/29/22 148 lb 9.6 oz (67.4 kg)     GEN: *** Well nourished, well developed in no acute distress CARDIAC: ***RRR, no murmurs, rubs, gallops.  Pacemaker pocket well-healed PSYCHIATRIC:  Normal affect        ASSESSMENT:    1. Syncope and collapse   2. Cardiac pacemaker in situ   3. Paroxysmal atrial fibrillation (HCC)   4. Primary hypertension   5. Encounter for  long-term (current) use of high-risk medication    PLAN:    In order of problems listed above:  #Syncope #Sinus node dysfunction #Tachybradycardia syndrome #Pacemaker in situ Pacemaker functioning appropriately.  Continue remote monitoring.  #Paroxysmal atrial fibrillation and flutter Doing well on Tikosyn.  QTc is acceptable for ongoing Tikosyn use.  On Eliquis for stroke prophylaxis Update BMP and magnesium today  #Hypertension *** goal today.  Recommend checking blood pressures 1-2 times per week at home and recording the values.  Recommend bringing these recordings to the primary care physician.   Follow-up every 3 months with APP.  I will plan to see her on an annual basis.    Medication Adjustments/Labs and Tests Ordered: Current medicines are reviewed at length with the patient today.  Concerns regarding medicines are outlined above.  No orders of the defined types were placed in this encounter.  No orders of the defined types were placed in this encounter.    Signed, Lars Mage, MD, [facility], [facility] 07/13/2022 5:39 AM    Electrophysiology [facility]

## 2022-07-13 NOTE — Progress Notes (Signed)
Electrophysiology Office Follow up Visit Note:    Date:  07/13/2022   ID:  Christy [name], DOB 03/12/1941, MRN [id number]  PCP:  Wenda Low, MD  [facility] Cardiologist:  None  [facility] Electrophysiologist:  None    Interval History:    Christy [name] is a 82 y.o. female who presents for a follow up visit.  She had a pacemaker implanted April 01, 2022 for symptomatic sinus node dysfunction and pauses.  She is tolerated and follow-up on April 18, 2022 and was doing well. She is on Tikosyn to help manage her atrial fibrillation and flutters.  She takes Eliquis for stroke prophylaxis.  Today, she states she is feeling alright. Since she started using her prn diltiazem at bedtime, this seems to have improved her palpitations as they do not last as long. However she does continue to have rapid palpitations at times. Recently she woke up with a heart rate of 120 bpm about 1:00 AM. Then at 5:30 AM this recurred at 150 bpm for about 15 minutes. She notes that there were no further palpitations for the rest of that day.  She denies any chest pain, shortness of breath, or peripheral edema. No lightheadedness, headaches, syncope, orthopnea, or PND.  She does have a bruise on the left forearm that is 1 to 2 cm in height and probably 4 to 5 cm in diameter.  She is concerned about the risk of bleeding in her head if she were to ever fall.     Past Medical History:  Diagnosis Date   Anxiety    Atrial fibrillation ([place])    Bilateral lower extremity edema    Chronic, likely related to venous stasis    DJD (degenerative joint disease), lumbosacral    Dyslipidemia    Dysrhythmia    A fib   Essential hypertension    "dx'd recently" (10/06/2016)   GERD (gastroesophageal reflux disease)    History of hepatitis C virus infection ~ 1997   In remission   Lung granuloma ([place]) 2004   CT scan   Mitral regurgitation    04/30/21 echo: bileaflet prolapse with moderate MR   Osteopenia     Paroxysmal atrial flutter ([place]) 04/02/2022   Paroxysmal atrial tachycardia    PE (pulmonary embolism) 01/2016   PONV (postoperative nausea and vomiting)    Patient states she had extremem nausea after neck surgery several years ago   Spondylolisthesis    Status post epidural injections 3 by neurosurgery  - Dr. Joya Salm    Varicose veins of both legs with edema    Vitamin D deficiency     Past Surgical History:  Procedure Laterality Date   ATRIAL FIBRILLATION ABLATION N/A 03/09/2017   Procedure: Atrial Fibrillation Ablation;  Surgeon: [name], MD;  Location: [place] [facility];  Service: Cardiovascular;  Laterality: N/A;   BACK SURGERY     BREAST LUMPECTOMY WITH RADIOACTIVE SEED LOCALIZATION Left 08/12/2021   Procedure: LEFT BREAST LUMPECTOMY WITH RADIOACTIVE SEED LOCALIZATION;  Surgeon: Donnie Mesa, MD;  Location: [place];  Service: General;  Laterality: Left;   CARDIOVERSION N/A 03/21/2016   Procedure: CARDIOVERSION;  Surgeon: [name], MD;  Location: [place];  Service: Cardiovascular;  Laterality: N/A;   COMBINED HYSTEROSCOPY DIAGNOSTIC / D&C  2005   COMBINED HYSTEROSCOPY DIAGNOSTIC / D&C  1999   DILATION AND CURETTAGE OF UTERUS     implantable loop recorder removal  11/10/2021   LOOP RECORDER INSERTION N/A 10/31/2017   Procedure:  LOOP RECORDER INSERTION;  Surgeon: [name], MD;  Location: [place] [facility];  Service: Cardiovascular;  Laterality: N/A;   [place]   "went in front and back; broken neck"   PACEMAKER IMPLANT N/A 04/01/2022   Procedure: PACEMAKER IMPLANT;  Surgeon: Vickie Epley, MD;  Location: [place] [facility];  Service: Cardiovascular;  Laterality: N/A;   RE-EXCISION OF BREAST CANCER,SUPERIOR MARGINS Left 08/25/2021   Procedure: RE-EXCISION OF SUPERIOR MARGIN -  LEFT BREAST LUMPECTOMY;  Surgeon: Donnie Mesa, MD;  Location: [place];  Service: General;  Laterality: Left;   TEE WITHOUT CARDIOVERSION N/A 03/08/2017   Procedure:  TRANSESOPHAGEAL ECHOCARDIOGRAM (TEE);  Surgeon: Josue Hector, MD;  Location[facility];  Service: Cardiovascular;  Laterality: N/A;   TOE SURGERY Right    replaced joint   TONSILLECTOMY     TUBAL LIGATION Bilateral 1976   VAGINAL DELIVERY     x2    Current Medications: Current Meds  Medication Sig   acetaminophen (TYLENOL) 325 MG tablet Take 2 tablets (650 mg total) by mouth every 4 (four) hours as needed for headache or mild pain.   apixaban (ELIQUIS) 5 MG TABS tablet Take 1 tablet (5 mg total) by mouth 2 (two) times daily.   betamethasone valerate (VALISONE) 0.1 % cream Apply 1 application topically 2 (two) times daily as needed (itching.).   cholecalciferol (VITAMIN D3) 25 MCG (1000 UNIT) tablet Take 1,000 Units by mouth in the morning.   diltiazem (CARDIZEM CD) 120 MG 24 hr capsule Take 1 capsule (120 mg total) by mouth at bedtime.   diltiazem (CARDIZEM) 30 MG tablet Take 1 tablet every 4 hours AS NEEDED for AFIB heart rate >100 as long as top BP >100.   dofetilide (TIKOSYN) 250 MCG capsule TAKE 1 CAPSULE BY MOUTH TWICE DAILY IN THE MORNING AND DINNER FOR HEART (Patient taking differently: Take 250 mcg by mouth 2 (two) times daily.)   furosemide (LASIX) 20 MG tablet Take 1 tablet (20 mg total) by mouth daily as needed for fluid or edema (weight gain of 3 lbs in 24 hrs or 5 lbs in 1 week).   loratadine (CLARITIN) 10 MG tablet Take 10 mg by mouth in the morning.   losartan (COZAAR) 25 MG tablet Take 25 mg by mouth in the morning.   Magnesium 250 MG TABS Take 250 mg by mouth at bedtime.   pantoprazole (PROTONIX) 40 MG tablet Take 40 mg by mouth 2 (two) times daily.   potassium chloride (KLOR-CON) 10 MEQ tablet TAKE 1 TABLET(10 MEQ) BY MOUTH DAILY   Probiotic Product (PROBIOTIC DAILY PO) Take 1 tablet by mouth every morning. May take 2 tablets as needed   propranolol (INDERAL) 20 MG tablet Take 1 tablet (20 mg total) by mouth 2 (two) times daily.     Allergies:   Caffeine and  Epinephrine   Social History   Socioeconomic History   Marital status: Married    Spouse name: Not on file   Number of children: 2   Years of education: Not on file   Highest education level: Not on file  Occupational History   Not on file  Tobacco Use   Smoking status: Never   Smokeless tobacco: Never  Vaping Use   Vaping Use: Never used  Substance and Sexual Activity   Alcohol use: Yes    Alcohol/week: 14.0 standard drinks of alcohol    Types: 14 Glasses of wine per week    Comment: occasional  Drug use: No   Sexual activity: Yes    Birth control/protection: Other-see comments    Comment: tubal ligation  Other Topics Concern   Not on file  Social History Narrative   She is a married mother of 1. Lives with her husband. 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 She has never smoked and drinks up to 10 glasses of wine or so week.   She usually exercises roughly 3 days a week doing yoga and plays golf. She is mostly limited by her "time constraints "   Social Determinants of Health   Financial Resource Strain: Not on file  Food Insecurity: No Food Insecurity (03/31/2022)   Hunger Vital Sign    Worried About Running Out of Food in the Last Year: Never true    Ran Out of Food in the Last Year: Never true  Transportation Needs: No Transportation Needs (03/31/2022)   PRAPARE - Hydrologist (Medical): No    Lack of Transportation (Non-Medical): No  Physical Activity: Not on file  Stress: Not on file  Social Connections: Not on file     Family History: The patient's family history includes CVA in her father and mother; Heart Problems in her mother; Heart attack (age of onset: 58) in her mother. There is no history of Colon cancer or Clotting disorder.  ROS:   Please see the history of present illness.    (+) Palpitations All other systems reviewed and are negative.  EKGs/Labs/Other Studies Reviewed:    The following studies were  reviewed today:  July 13, 2022 in clinic device interrogation personally reviewed Battery longevity 16.3 years Lead parameter stable Ventricular pacing 4% Atrial pacing 77.6% 6.9% A-fib burden Minerva programmed on  EKG:  The ekg ordered today demonstrates a paced, V sensed with intermittent PVCs  Recent Labs: 07/28/2021: ALT 11 03/31/2022: Hemoglobin 13.3; Platelets 208 04/05/2022: BUN 27; Creatinine, Ser 0.92; Magnesium 2.2; Potassium 4.8; Sodium 140   Recent Lipid Panel No results found for: "CHOL", "TRIG", "HDL", "CHOLHDL", "VLDL", "LDLCALC", "LDLDIRECT"  Physical Exam:    VS:  BP (!) 140/72   Pulse 72   Ht 5' 7.75" (1.721 m)   Wt 155 lb (70.3 kg)   SpO2 98%   BMI 23.74 kg/m     Wt Readings from Last 3 Encounters:  07/13/22 155 lb (70.3 kg)  04/18/22 149 lb 3.2 oz (67.7 kg)  03/31/22 146 lb 8 oz (66.5 kg)     GEN: Well nourished, well developed in no acute distress CARDIAC: RRR, no murmurs, rubs, gallops.  Pacemaker pocket well-healed PSYCHIATRIC:  Normal affect        ASSESSMENT:    1. Syncope and collapse   2. Cardiac pacemaker in situ   3. Paroxysmal atrial fibrillation (HCC)   4. Primary hypertension   5. Encounter for long-term (current) use of high-risk medication    PLAN:    In order of problems listed above:  #Syncope #Sinus node dysfunction #Tachybradycardia syndrome #Pacemaker in situ Pacemaker functioning appropriately.  Continue remote monitoring.  #Paroxysmal atrial fibrillation and flutter Doing well on Tikosyn.  QTc is acceptable for ongoing Tikosyn use.  On Eliquis for stroke prophylaxis Update BMP and magnesium today We briefly discussed left atrial appendage occlusion today given her concerns of intracranial hemorrhage.  #Hypertension Upper limit of normal today. Recommend checking blood pressures 1-2 times per week at home and recording the values.  Recommend bringing these recordings to the primary care  physician.   Follow-up every 3 months with APP.  I will plan to see her on an annual basis.    Medication Adjustments/Labs and Tests Ordered: Current medicines are reviewed at length with the patient today.  Concerns regarding medicines are outlined above.   No orders of the defined types were placed in this encounter.  No orders of the defined types were placed in this encounter.  I,Mathew Stumpf,acting as a Education administrator for Vickie Epley, MD.,have documented all relevant documentation on the behalf of Vickie Epley, MD,as directed by  Vickie Epley, MD while in the presence of Vickie Epley, MD.  I, Vickie Epley, MD, have reviewed all documentation for this visit. The documentation on 07/13/22 for the exam, diagnosis, procedures, and orders are all accurate and complete.   Signed, Lars Mage, MD, [facility], [facility] 07/13/2022 3:13 PM    Electrophysiology [facility]

## 2022-07-14 DIAGNOSIS — L821 Other seborrheic keratosis: Secondary | ICD-10-CM | POA: Diagnosis not present

## 2022-07-14 DIAGNOSIS — L570 Actinic keratosis: Secondary | ICD-10-CM | POA: Diagnosis not present

## 2022-07-14 DIAGNOSIS — Z85828 Personal history of other malignant neoplasm of skin: Secondary | ICD-10-CM | POA: Diagnosis not present

## 2022-07-14 DIAGNOSIS — L218 Other seborrheic dermatitis: Secondary | ICD-10-CM | POA: Diagnosis not present

## 2022-07-14 DIAGNOSIS — D1801 Hemangioma of skin and subcutaneous tissue: Secondary | ICD-10-CM | POA: Diagnosis not present

## 2022-07-14 DIAGNOSIS — M7981 Nontraumatic hematoma of soft tissue: Secondary | ICD-10-CM | POA: Diagnosis not present

## 2022-07-14 DIAGNOSIS — D485 Neoplasm of uncertain behavior of skin: Secondary | ICD-10-CM | POA: Diagnosis not present

## 2022-07-14 LAB — BASIC METABOLIC PANEL
BUN/Creatinine Ratio: 38 — ABNORMAL HIGH (ref 12–28)
BUN: 30 mg/dL — ABNORMAL HIGH (ref 8–27)
CO2: 27 mmol/L (ref 20–29)
Calcium: 9.6 mg/dL (ref 8.7–10.3)
Chloride: 101 mmol/L (ref 96–106)
Creatinine, Ser: 0.79 mg/dL (ref 0.57–1.00)
Glucose: 97 mg/dL (ref 70–99)
Potassium: 4.1 mmol/L (ref 3.5–5.2)
Sodium: 141 mmol/L (ref 134–144)
eGFR: 74 mL/min/{1.73_m2} (ref >59)

## 2022-07-14 LAB — MAGNESIUM: Magnesium: 1.9 mg/dL (ref 1.6–2.3)

## 2022-07-19 ENCOUNTER — Encounter: Payer: Self-pay | Admitting: Cardiology

## 2022-07-20 ENCOUNTER — Ambulatory Visit (HOSPITAL_COMMUNITY)
Admission: RE | Admit: 2022-07-20 | Discharge: 2022-07-20 | Disposition: A | Discharge status: Home / Self Care | Payer: PPO | Source: Ambulatory Visit | Attending: Nurse Practitioner | Admitting: Nurse Practitioner

## 2022-07-20 ENCOUNTER — Encounter (HOSPITAL_COMMUNITY): Payer: Self-pay

## 2022-07-20 VITALS — BP 142/80 | HR 80 | Ht 67.75 in | Wt 154.4 lb

## 2022-07-20 DIAGNOSIS — I4892 Unspecified atrial flutter: Secondary | ICD-10-CM | POA: Insufficient documentation

## 2022-07-20 DIAGNOSIS — I493 Ventricular premature depolarization: Secondary | ICD-10-CM | POA: Diagnosis not present

## 2022-07-20 DIAGNOSIS — I491 Atrial premature depolarization: Secondary | ICD-10-CM | POA: Diagnosis not present

## 2022-07-20 NOTE — Progress Notes (Signed)
Pt had this appointment scheduled previous to Dr. Mardene Speak  appointment l1/24/24  for Tikosyn surveillance and this appointment was not necessary. It was noted after EKG was done. Overall feeling well. EKG shows  Vent. rate 80 BPM. Labs checked at that time and in range.    PR interval 340 ms QRS duration 76 ms QT/QTcB 406/468 ms P-R-T axes 81 50 51 Atrial-paced rhythm with prolonged AV conduction with occasional sinus complexes and with occasional Premature ventricular complexes and Premature atrial complexes Abnormal ECG When compared with ECG of 02-Apr-2022 05:19, PREVIOUS ECG IS PRESENT  F/u with Tommye Standard, PA, 10/10/22 as scheduled

## 2022-07-28 ENCOUNTER — Encounter (HOSPITAL_COMMUNITY): Payer: Self-pay | Admitting: *Deleted

## 2022-07-29 NOTE — Progress Notes (Signed)
Remote pacemaker transmission.   

## 2022-08-08 ENCOUNTER — Telehealth: Payer: Self-pay

## 2022-08-08 NOTE — Telephone Encounter (Signed)
Alert remote reviewed. Normal device function.   atrial arrhythmias treated with 1-77 bursts of atrial ATP, 9 out of 49 were successful,  AF burden is 13.1% of the time.  On [place], overall ventricular rates are controlled,  sent to triage due to frequent atrial ATP.   There was one short NSVT arrhythmia detected  Next remote 10/04/2022. Kathy Breach, RN, CCDS, [employer]  Patient states that she was in [facility] last week.  She was under some stress being out of town and doing a lot of walking.  But, overall, she felt good and has no symptoms. No missed doses of medications Will forward this to Afib clinic Roderic Palau, NP and Dr.Lambert to review.   NOTE: Reviewed with Dr. Quentin Ore in office on 08/10/22.  He stated nothing to do unless ineffective ATP continues, we could bring patient in to program off. Continue to monitor.

## 2022-08-10 DIAGNOSIS — R062 Wheezing: Secondary | ICD-10-CM | POA: Diagnosis not present

## 2022-08-10 DIAGNOSIS — J300 Vasomotor rhinitis: Secondary | ICD-10-CM | POA: Diagnosis not present

## 2022-08-11 ENCOUNTER — Ambulatory Visit (HOSPITAL_COMMUNITY)
Admission: RE | Admit: 2022-08-11 | Discharge: 2022-08-11 | Disposition: A | Discharge status: Home / Self Care | Payer: PPO | Source: Ambulatory Visit | Attending: Nurse Practitioner | Admitting: Nurse Practitioner

## 2022-08-11 VITALS — BP 134/84 | HR 75 | Ht 67.75 in | Wt 158.4 lb

## 2022-08-11 DIAGNOSIS — Z79899 Other long term (current) drug therapy: Secondary | ICD-10-CM | POA: Insufficient documentation

## 2022-08-11 DIAGNOSIS — I10 Essential (primary) hypertension: Secondary | ICD-10-CM | POA: Insufficient documentation

## 2022-08-11 DIAGNOSIS — I4892 Unspecified atrial flutter: Secondary | ICD-10-CM | POA: Insufficient documentation

## 2022-08-11 DIAGNOSIS — Z86718 Personal history of other venous thrombosis and embolism: Secondary | ICD-10-CM | POA: Diagnosis not present

## 2022-08-11 DIAGNOSIS — Z7901 Long term (current) use of anticoagulants: Secondary | ICD-10-CM | POA: Insufficient documentation

## 2022-08-11 DIAGNOSIS — D6869 Other thrombophilia: Secondary | ICD-10-CM

## 2022-08-11 NOTE — Progress Notes (Signed)
Primary Care Physician: Christy Low, MD Referring Physician:  device clinic EP: Christy [name] is a 82 y.o. female with a h/o atrial fib and PE/DVT 01/2016. She is has been treated with Rythmol and Tikosyn in the past. When her burden increased on Tikosyn, it was decided to pursue ablation 02/26/17.   She is in the afib clinic today, 04/10/19, for f/u of Tikosyn past ablation. She feels good. She has been staying in [place]. She has a linq, has intermittent palpitations but per Dr. Jackalyn [name] note disorganized atrial activity, but not afib. She is leaving for [place] in Feb/March,2021 for a couple of months. Continues on eliquis 5 mg bid for a CHA2DS2VASc score of 5, no bleeding issues.  F/u in afib clinic, 07/18/19. She reports that she had Covid 19 the earlier part of the month while she and her husband were at the beach. They believe the exposure was from  2 furniture  delivery men  that were in their house for awhile, as they did not come into contact with other people and did not eat in restaurants.  She  and her husband both came down with it and received the antibody infusion at [facility] hospital. She overall felt better after that and she feels their symptoms were fairly mild. She did not have any afib while she was sick with covid. Continues on tikosyn and eliquis with a CHA2DS2VASc score of 5.  F/u in afib clinic, 08/27/19, pt called to office saying that she is seeing HR's by BP cuff in the 40's at home even though she feels well. We ran a Link report that shows PAC's as well as episodes of afib that usually  last around mins to one hour. Pt does not feel these episodes. Her longest afib episode was 50 mins on 2/26 and 2/27 with v rates 120-150, again pt did not feel this. EKG today shows SR with pac's. She feels well today.  F/u in afib clinic, 08/27/20. Afib has been quiet recently. She saw Dr. Rayann [name] 06/01/21 and by his assessment then, he did not feel she was a repeat ablation  candidate. She continues on tikosyn. No bleeding issues with eliquis with a CHA2DS2VASc score of 5.   F/u in afib clinic, 03/03/21. She remains on Tikosyn. One episode of afib that lasted around 24 hours and resolved on its own. She has an apple watch now so we discussed how she can track this as well as with her Linq. Last Linq report in September did not show any arrhythmia. Continues on eliquis 5 mg bid for a CHA2DS2VASc  score of 5.  F/u in afib clinic, 10/13/21  for Tikosyn surveillance. Qt is stable. Only one episode of Afib lasting less than 8 hours. Remain complaint on Tikosyn.  She was dx with  early L breast CA in February, treated with lumpectomy and  will be finishing radiation soon. Has had a congested sough x 2 weeks. PCP has evaluated.   F/u in afib clinic, 12/28/21. She developed afib on Saturday. She has been complaint with Tikosyn and anticoagulation. No known triggers. She had her Linq explanted in May. She was in her garage this am and became lightheaded for a few seconds. Afther that she felt like she was back in [name]. EKG today confirms this. She has some stress recently which may have contributed to the afib.   F/u in afib clinic 10/10 for Tikosyn surveillance but also reports that she has a  syncopal episode this past Thursday night after drinking around 3/4 glass of wine. She may not have eaten that much that day. Her husband saw her fall, she was out for less than one minute.she was out of rhythm when she regained consciousness.  She states she did not hit her head. She did not seek any type of medial attention.  She is also describing episodes of feeling dizzy  for just a few seconds at a time.no further syncope. While running ekg today,she went from sinus brady at 57 bpm to accelerated  junctional at 122 bpm for just a few minutes. With the faster rate she felt dizzy.    F/u in afib clinic, 08/11/22. She was notified that she was back in afib by device clinic on Monday with increase  afib burden and ongoing afib. EKG today shows afib at 75 bpm. She was not aware that she was in afib. She just got back from  [place] and even being  on vacation, it was stressful. They have recently made a move to [place]  and she is not happy there. She was drinking around 1 1/2 glasses of wine a night and admitted to high salt intake.   Today, she denies symptoms of palpitations, chest pain, shortness of breath, orthopnea, PND, lower extremity edema, dizziness, presyncope, syncope, or neurologic sequela. The patient is tolerating medications without difficulties and is otherwise without complaint today.   Past Medical History:  Diagnosis Date   Anxiety    Atrial fibrillation ([place])    Bilateral lower extremity edema    Chronic, likely related to venous stasis    DJD (degenerative joint disease), lumbosacral    Dyslipidemia    Dysrhythmia    A fib   Essential hypertension    "dx'd recently" (10/06/2016)   GERD (gastroesophageal reflux disease)    History of hepatitis C virus infection ~ 1997   In remission   Lung granuloma ([place]) 2004   CT scan   Mitral regurgitation    04/30/21 echo: bileaflet prolapse with moderate MR   Osteopenia    Paroxysmal atrial flutter ([place]) 04/02/2022   Paroxysmal atrial tachycardia    PE (pulmonary embolism) 01/2016   PONV (postoperative nausea and vomiting)    Patient states she had extremem nausea after neck surgery several years ago   Spondylolisthesis    Status post epidural injections 3 by neurosurgery  - Dr. Joya Salm    Varicose veins of both legs with edema    Vitamin D deficiency    Past Surgical History:  Procedure Laterality Date   ATRIAL FIBRILLATION ABLATION N/A 03/09/2017   Procedure: Atrial Fibrillation Ablation;  Surgeon: [name], MD;  Location: [facility];  Service: Cardiovascular;  Laterality: N/A;   BACK SURGERY     BREAST LUMPECTOMY WITH RADIOACTIVE SEED LOCALIZATION Left 08/12/2021   Procedure: LEFT BREAST  LUMPECTOMY WITH RADIOACTIVE SEED LOCALIZATION;  Surgeon: Donnie Mesa, MD;  Location: [place];  Service: General;  Laterality: Left;   CARDIOVERSION N/A 03/21/2016   Procedure: CARDIOVERSION;  Surgeon: [name], MD;  Location: [facility];  Service: Cardiovascular;  Laterality: N/A;   COMBINED HYSTEROSCOPY DIAGNOSTIC / D&C  2005   COMBINED HYSTEROSCOPY DIAGNOSTIC / D&C  1999   DILATION AND CURETTAGE OF UTERUS     implantable loop recorder removal  11/10/2021   LOOP RECORDER INSERTION N/A 10/31/2017   Procedure: LOOP RECORDER INSERTION;  Surgeon: [name], MD;  Location: [facility];  Service: Cardiovascular;  Laterality:  N/A;   [place]   "went in front and back; broken neck"   PACEMAKER IMPLANT N/A 04/01/2022   Procedure: PACEMAKER IMPLANT;  Surgeon: Vickie Epley, MD;  Location: [facility];  Service: Cardiovascular;  Laterality: N/A;   RE-EXCISION OF BREAST CANCER,SUPERIOR MARGINS Left 08/25/2021   Procedure: RE-EXCISION OF SUPERIOR MARGIN -  LEFT BREAST LUMPECTOMY;  Surgeon: Donnie Mesa, MD;  Location: [facility];  Service: General;  Laterality: Left;   TEE WITHOUT CARDIOVERSION N/A 03/08/2017   Procedure: TRANSESOPHAGEAL ECHOCARDIOGRAM (TEE);  Surgeon: Josue Hector, MD;  Location: [facility];  Service: Cardiovascular;  Laterality: N/A;   TOE SURGERY Right    replaced joint   TONSILLECTOMY     TUBAL LIGATION Bilateral 1976   VAGINAL DELIVERY     x2    Current Outpatient Medications  Medication Sig Dispense Refill   acetaminophen (TYLENOL) 325 MG tablet Take 2 tablets (650 mg total) by mouth every 4 (four) hours as needed for headache or mild pain.     apixaban (ELIQUIS) 5 MG TABS tablet Take 1 tablet (5 mg total) by mouth 2 (two) times daily.     betamethasone valerate (VALISONE) 0.1 % cream Apply 1 application topically 2 (two) times daily as needed (itching.).     cholecalciferol (VITAMIN D3) 25 MCG (1000 UNIT) tablet Take 1,000 Units  by mouth in the morning.     diltiazem (CARDIZEM CD) 120 MG 24 hr capsule Take 1 capsule (120 mg total) by mouth at bedtime. 30 capsule 3   diltiazem (CARDIZEM) 30 MG tablet Take 1 tablet every 4 hours AS NEEDED for AFIB heart rate >100 as long as top BP >100. 30 tablet 1   dofetilide (TIKOSYN) 250 MCG capsule TAKE 1 CAPSULE BY MOUTH TWICE DAILY IN THE MORNING AND DINNER FOR HEART (Patient taking differently: Take 250 mcg by mouth 2 (two) times daily.) 180 capsule 1   furosemide (LASIX) 20 MG tablet Take 1 tablet (20 mg total) by mouth daily as needed for fluid or edema (weight gain of 3 lbs in 24 hrs or 5 lbs in 1 week). 30 tablet 3   loratadine (CLARITIN) 10 MG tablet Take 10 mg by mouth in the morning.     losartan (COZAAR) 25 MG tablet Take 25 mg by mouth in the morning.     Magnesium 500 MG TABS Take 500 mg by mouth at bedtime.     pantoprazole (PROTONIX) 40 MG tablet Take 40 mg by mouth 2 (two) times daily.     potassium chloride (KLOR-CON) 10 MEQ tablet TAKE 1 TABLET(10 MEQ) BY MOUTH DAILY 30 tablet 6   Probiotic Product (PROBIOTIC DAILY PO) Take 1 tablet by mouth every morning. May take 2 tablets as needed     propranolol (INDERAL) 20 MG tablet Take 1 tablet (20 mg total) by mouth 2 (two) times daily. 180 tablet 3   No current facility-administered medications for this encounter.    Allergies  Allergen Reactions   Caffeine     Heart palpitations   Epinephrine Palpitations    Social History   Socioeconomic History   Marital status: Married    Spouse name: Not on file   Number of children: 2   Years of education: Not on file   Highest education level: Not on file  Occupational History   Not on file  Tobacco Use   Smoking status: Never   Smokeless tobacco: Never  Vaping Use   Vaping Use:  Never used  Substance and Sexual Activity   Alcohol use: Yes    Alcohol/week: 14.0 standard drinks of alcohol    Types: 14 Glasses of wine per week    Comment: occasional   Drug use:  No   Sexual activity: Yes    Birth control/protection: Other-see comments    Comment: tubal ligation  Other Topics Concern   Not on file  Social History Narrative   She is a married mother of 1. Lives with her husband. 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 She has never smoked and drinks up to 10 glasses of wine or so week.   She usually exercises roughly 3 days a week doing yoga and plays golf. She is mostly limited by her "time constraints "   Social Determinants of Health   Financial Resource Strain: Not on file  Food Insecurity: No Food Insecurity (03/31/2022)   Hunger Vital Sign    Worried About Running Out of Food in the Last Year: Never true    Ran Out of Food in the Last Year: Never true  Transportation Needs: No Transportation Needs (03/31/2022)   PRAPARE - Hydrologist (Medical): No    Lack of Transportation (Non-Medical): No  Physical Activity: Not on file  Stress: Not on file  Social Connections: Not on file  Intimate Partner Violence: Not At Risk (03/31/2022)   Humiliation, Afraid, Rape, and Kick questionnaire    Fear of Current or Ex-Partner: No    Emotionally Abused: No    Physically Abused: No    Sexually Abused: No    Family History  Problem Relation Age of Onset   Heart Problems Mother    CVA Mother    Heart attack Mother 50   CVA Father    Colon cancer Neg Hx    Clotting disorder Neg Hx     ROS- All systems are reviewed and negative except as per the HPI above  Physical Exam: Vitals:   08/11/22 1440  BP: 134/84  Pulse: 75  Weight: 71.8 kg  Height: 5' 7.75" (1.721 m)    Wt Readings from Last 3 Encounters:  08/11/22 71.8 kg  07/20/22 70 kg  07/13/22 70.3 kg    Labs: Lab Results  Component Value Date   NA 141 07/13/2022   K 4.1 07/13/2022   CL 101 07/13/2022   CO2 27 07/13/2022   GLUCOSE 97 07/13/2022   BUN 30 (H) 07/13/2022   CREATININE 0.79 07/13/2022   CALCIUM 9.6 07/13/2022   MG 1.9  07/13/2022   Lab Results  Component Value Date   INR 1.1 03/17/2016   No results found for: "CHOL", "HDL", "LDLCALC", "TRIG"   GEN- The patient is well appearing, alert and oriented x 3 today.   Head- normocephalic, atraumatic Eyes-  Sclera clear, conjunctiva pink Ears- hearing intact Oropharynx- clear Neck- supple, no JVP Lymph- no cervical lymphadenopathy Lungs- Clear to ausculation bilaterally, normal work of breathing Heart- irregular rate and rhythm, no murmurs, rubs or gallops, PMI not laterally displaced GI- soft, NT, ND, + BS Extremities- no clubbing, cyanosis, or edema MS- no significant deformity or atrophy Skin- no rash or lesion Psych- euthymic mood, full affect Neuro- strength and sensation are intact  EKG-   Vent. rate 75 BPM PR interval * ms QRS duration 78 ms QT/QTcB 392/437 ms P-R-T axes * 51 58 Atrial fibrillation with premature ventricular or aberrantly conducted complexes and with ventricular escape complexes Abnormal ECG When  compared with ECG of 20-Jul-2022 11:53, PREVIOUS ECG IS PRESENT    Assessment and Plan: 1. Paroxysmal atrial fibrillation S/p ablation 02/2017  Increase in afib burden with ongoing episode  Has been in [place] and under considerable stress  We discussed use of increase dilt to 120 mg bid but she has issue with [name] BP with extra rate control We discussed options of going over to amiodarone/? Repeat ablation She would like to get back in  her usual routine for another week and see if she will flip back to SR Continue dofetilide 250 mcg bid/BB without change Continue eliquis 5 mg bid with chadsvasc score of 5  2. HTN Stable   I will review device clinic report in one week and then discuss with pt plan     Christy [name], [facility] [address] [place], [place] [address] [phone]

## 2022-08-11 NOTE — Telephone Encounter (Signed)
Call back received from Pt with concerns regarding afib.  Outreach made to afib clinic.  Appt available today at 3pm.  Pt advised.  She is agreeable.

## 2022-08-11 NOTE — Telephone Encounter (Signed)
Patient called in stating she is now concerned with her heart rate and since the weekend is approaching she would like someone to review the transmission she is sending in today and get back with her

## 2022-08-15 DIAGNOSIS — Z853 Personal history of malignant neoplasm of breast: Secondary | ICD-10-CM | POA: Diagnosis not present

## 2022-08-17 ENCOUNTER — Telehealth (HOSPITAL_COMMUNITY): Payer: Self-pay | Admitting: *Deleted

## 2022-08-17 NOTE — Telephone Encounter (Signed)
Pt to send transmission in 1 week - if still in AF at that transmission will plan for cardioversion per donna carroll NP. Pt in agreement.

## 2022-08-17 NOTE — Telephone Encounter (Signed)
Pt called in stating she has been feeling better the last few days with HRs int he 70s and BP 121/84 wondering if she is back in NSR -- instructed pt to send transmission for review. Pt will attempt to send now.

## 2022-08-18 ENCOUNTER — Encounter: Payer: Self-pay | Admitting: Hematology

## 2022-08-18 ENCOUNTER — Ambulatory Visit: Payer: PPO

## 2022-08-18 DIAGNOSIS — R55 Syncope and collapse: Secondary | ICD-10-CM

## 2022-08-18 LAB — CUP PACEART REMOTE DEVICE CHECK
Battery Remaining Longevity: 162 mo
Battery Voltage: 3.15 V
Brady Statistic RA Percent Paced: 4.9 %
Brady Statistic RV Percent Paced: 38.6 %
Date Time Interrogation Session: 20240228093631
Implantable Lead Connection Status: 753985
Implantable Lead Connection Status: 753985
Implantable Lead Implant Date: 20231013
Implantable Lead Implant Date: 20231013
Implantable Lead Location: 753859
Implantable Lead Location: 753860
Implantable Lead Model: 3830
Implantable Lead Model: 5076
Implantable Pulse Generator Implant Date: 20231013
Lead Channel Impedance Value: 304 Ohm
Lead Channel Impedance Value: 380 Ohm
Lead Channel Impedance Value: 418 Ohm
Lead Channel Impedance Value: 589 Ohm
Lead Channel Pacing Threshold Amplitude: 0.5 V
Lead Channel Pacing Threshold Amplitude: 0.625 V
Lead Channel Pacing Threshold Pulse Width: 0.4 ms
Lead Channel Pacing Threshold Pulse Width: 0.4 ms
Lead Channel Sensing Intrinsic Amplitude: 0.375 mV
Lead Channel Sensing Intrinsic Amplitude: 0.375 mV
Lead Channel Sensing Intrinsic Amplitude: 3.25 mV
Lead Channel Sensing Intrinsic Amplitude: 3.25 mV
Lead Channel Setting Pacing Amplitude: 1.5 V
Lead Channel Setting Pacing Amplitude: 2 V
Lead Channel Setting Pacing Pulse Width: 0.4 ms
Lead Channel Setting Sensing Sensitivity: 1.2 mV
Zone Setting Status: 755011

## 2022-08-22 ENCOUNTER — Other Ambulatory Visit: Payer: Self-pay

## 2022-08-22 ENCOUNTER — Other Ambulatory Visit (HOSPITAL_COMMUNITY): Payer: Self-pay | Admitting: *Deleted

## 2022-08-22 MED ORDER — DOFETILIDE 250 MCG PO CAPS: ORAL_CAPSULE | ORAL | 1 refills | Status: DC

## 2022-08-22 MED ORDER — DILTIAZEM HCL ER COATED BEADS 120 MG PO CP24: 120.0000 mg | ORAL_CAPSULE | Freq: Every day | ORAL | 3 refills | Status: DC

## 2022-08-24 NOTE — Telephone Encounter (Signed)
Looks like she is in AF, been pretty persistent since 08/17/22

## 2022-08-26 DIAGNOSIS — M170 Bilateral primary osteoarthritis of knee: Secondary | ICD-10-CM | POA: Diagnosis not present

## 2022-08-26 DIAGNOSIS — M1712 Unilateral primary osteoarthritis, left knee: Secondary | ICD-10-CM | POA: Diagnosis not present

## 2022-08-26 DIAGNOSIS — M1711 Unilateral primary osteoarthritis, right knee: Secondary | ICD-10-CM | POA: Diagnosis not present

## 2022-08-31 ENCOUNTER — Ambulatory Visit (HOSPITAL_COMMUNITY)
Admission: RE | Admit: 2022-08-31 | Discharge: 2022-08-31 | Disposition: A | Discharge status: Home / Self Care | Payer: PPO | Source: Ambulatory Visit | Attending: Physician Assistant | Admitting: Physician Assistant

## 2022-08-31 VITALS — BP 134/80 | HR 84 | Ht 67.75 in | Wt 157.6 lb

## 2022-08-31 DIAGNOSIS — I10 Essential (primary) hypertension: Secondary | ICD-10-CM | POA: Insufficient documentation

## 2022-08-31 DIAGNOSIS — I495 Sick sinus syndrome: Secondary | ICD-10-CM | POA: Insufficient documentation

## 2022-08-31 DIAGNOSIS — Z7901 Long term (current) use of anticoagulants: Secondary | ICD-10-CM | POA: Diagnosis not present

## 2022-08-31 DIAGNOSIS — Z950 Presence of cardiac pacemaker: Secondary | ICD-10-CM | POA: Insufficient documentation

## 2022-08-31 DIAGNOSIS — D6869 Other thrombophilia: Secondary | ICD-10-CM | POA: Insufficient documentation

## 2022-08-31 DIAGNOSIS — I4819 Other persistent atrial fibrillation: Secondary | ICD-10-CM | POA: Diagnosis not present

## 2022-08-31 NOTE — Patient Instructions (Addendum)
Cardioversion scheduled for:   - Arrive at the [facility] and go to admitting at 7:30am Monday April 1   - Do not eat or drink anything after midnight the night prior to your procedure.   - Take all your morning medication (except diabetic medications) with a sip of water prior to arrival.  - You will not be able to drive home after your procedure.    - Do NOT miss any doses of your blood thinner - if you should miss a dose please notify our office immediately.   - If you feel as if you go back into normal rhythm prior to scheduled cardioversion, please notify our office immediately.   If your procedure is canceled in the cardioversion suite you will be charged a cancellation fee.

## 2022-08-31 NOTE — H&P (View-Only) (Signed)
 Primary Care Physician: Husain, Karrar, MD Referring Physician:  device clinic EP: Dr. Lambert    Christy [name] is a 82 y.o. female with a h/o atrial fib and PE/DVT 01/2016. She is has been treated with Rythmol and Tikosyn in the past. When her burden increased on Tikosyn, it was decided to pursue ablation 02/26/17.   She is in the afib clinic today, 04/10/19, for f/u of Tikosyn past ablation. She feels good. She has been staying in SR. She has a linq, has intermittent palpitations but per Dr. Allred's note disorganized atrial activity, but not afib. She is leaving for [place] in Feb/March,2021 for a couple of months. Continues on eliquis 5 mg bid for a CHA2DS2VASc score of 4, no bleeding issues.  F/u in afib clinic, 07/18/19. She reports that she had Covid 19 the earlier part of the month while she and her husband were at the beach. They believe the exposure was from  2 furniture  delivery men  that were in their house for awhile, as they did not come into contact with other people and did not eat in restaurants.  She  and her husband both came down with it and received the antibody infusion at [facility]. She overall felt better after that and she feels their symptoms were fairly mild. She did not have any afib while she was sick with covid. Continues on tikosyn and eliquis.  F/u in afib clinic, 08/27/19, pt called to office saying that she is seeing HR's by BP cuff in the 40's at home even though she feels well. We ran a Link report that shows PAC's as well as episodes of afib that usually  last around mins to one hour. Pt does not feel these episodes. Her longest afib episode was 50 mins on 2/26 and 2/27 with v rates 120-150, again pt did not feel this. EKG today shows SR with pac's. She feels well today.  F/u in afib clinic, 08/27/20. Afib has been quiet recently. She saw Dr. Allred 06/01/21 and by his assessment then, he did not feel she was a repeat ablation candidate. She continues on tikosyn. No  bleeding issues with eliquis.  F/u in afib clinic, 03/03/21. She remains on Tikosyn. One episode of afib that lasted around 24 hours and resolved on its own. She has an apple watch now so we discussed how she can track this as well as with her Linq. Last Linq report in September did not show any arrhythmia. Continues on eliquis 5 mg bid.  F/u in afib clinic, 10/13/21  for Tikosyn surveillance. Qt is stable. Only one episode of Afib lasting less than 8 hours. Remain complaint on Tikosyn.  She was dx with  early L breast CA in February, treated with lumpectomy and  will be finishing radiation soon. Has had a congested sough x 2 weeks. PCP has evaluated.   F/u in afib clinic, 12/28/21. She developed afib on Saturday. She has been complaint with Tikosyn and anticoagulation. No known triggers. She had her Linq explanted in May. She was in her garage this am and became lightheaded for a few seconds. Afther that she felt like she was back in SR. EKG today confirms this. She has some stress recently which may have contributed to the afib.   F/u in afib clinic 10/10 for Tikosyn surveillance but also reports that she has a syncopal episode this past Thursday night after drinking around 3/4 glass of wine. She may not have eaten that much   that day. Her husband saw her fall, she was out for less than one minute.she was out of rhythm when she regained consciousness.  She states she did not hit her head. She did not seek any type of medial attention.  She is also describing episodes of feeling dizzy  for just a few seconds at a time.no further syncope. While running ekg today,she went from sinus brady at 57 bpm to accelerated  junctional at 122 bpm for just a few minutes. With the faster rate she felt dizzy.    F/u in afib clinic, 08/11/22. She was notified that she was back in afib by device clinic on Monday with increase afib burden and ongoing afib. EKG today shows afib at 75 bpm. She was not aware that she was in afib.  She just got back from  [place] and even being  on vacation, it was stressful. They have recently made a move to [place]  and she is not happy there. She was drinking around 1 1/2 glasses of wine a night and admitted to high salt intake.   Follow up in the AF clinic 08/31/22. Patient noted to be in persistent atrial fibrillation by the device clinic. Patient reports she has felt well and has not been aware of her arrhythmia. She might have more fatigue with exertion but is not sure about this. No bleeding issues on anticoagulation.   Today, she denies symptoms of palpitations, chest pain, shortness of breath, orthopnea, PND, lower extremity edema, dizziness, presyncope, syncope, or neurologic sequela. The patient is tolerating medications without difficulties and is otherwise without complaint today.   Past Medical History:  Diagnosis Date   Anxiety    Atrial fibrillation (HCC)    Bilateral lower extremity edema    Chronic, likely related to venous stasis    DJD (degenerative joint disease), lumbosacral    Dyslipidemia    Dysrhythmia    A fib   Essential hypertension    "dx'd recently" (10/06/2016)   GERD (gastroesophageal reflux disease)    History of hepatitis C virus infection ~ 1997   In remission   Lung granuloma (HCC) 2004   CT scan   Mitral regurgitation    04/30/21 echo: bileaflet prolapse with moderate MR   Osteopenia    Paroxysmal atrial flutter (HCC) 04/02/2022   Paroxysmal atrial tachycardia    PE (pulmonary embolism) 01/2016   PONV (postoperative nausea and vomiting)    Patient states she had extremem nausea after neck surgery several years ago   Spondylolisthesis    Status post epidural injections 3 by neurosurgery  - Dr. Botero    Varicose veins of both legs with edema    Vitamin D deficiency    Past Surgical History:  Procedure Laterality Date   ATRIAL FIBRILLATION ABLATION N/A 03/09/2017   Procedure: Atrial Fibrillation Ablation;  Surgeon: Allred, James, MD;   Location: [facility];  Service: Cardiovascular;  Laterality: N/A;   BACK SURGERY     BREAST LUMPECTOMY WITH RADIOACTIVE SEED LOCALIZATION Left 08/12/2021   Procedure: LEFT BREAST LUMPECTOMY WITH RADIOACTIVE SEED LOCALIZATION;  Surgeon: Tsuei, Matthew, MD;  Location: [facility];  Service: General;  Laterality: Left;   CARDIOVERSION N/A 03/21/2016   Procedure: CARDIOVERSION;  Surgeon: Katarina H Nelson, MD;  Location: [facility];  Service: Cardiovascular;  Laterality: N/A;   COMBINED HYSTEROSCOPY DIAGNOSTIC / D&C  2005   COMBINED HYSTEROSCOPY DIAGNOSTIC / D&C  1999   DILATION AND CURETTAGE OF UTERUS     implantable loop   recorder removal  11/10/2021   LOOP RECORDER INSERTION N/A 10/31/2017   Procedure: LOOP RECORDER INSERTION;  Surgeon: Allred, James, MD;  Location: [facility];  Service: Cardiovascular;  Laterality: N/A;   NECK SURGERY  1995   "went in front and back; broken neck"   PACEMAKER IMPLANT N/A 04/01/2022   Procedure: PACEMAKER IMPLANT;  Surgeon: Lambert, Cameron T, MD;  Location: [facility];  Service: Cardiovascular;  Laterality: N/A;   RE-EXCISION OF BREAST CANCER,SUPERIOR MARGINS Left 08/25/2021   Procedure: RE-EXCISION OF SUPERIOR MARGIN -  LEFT BREAST LUMPECTOMY;  Surgeon: Tsuei, Matthew, MD;  Location: [facility];  Service: General;  Laterality: Left;   TEE WITHOUT CARDIOVERSION N/A 03/08/2017   Procedure: TRANSESOPHAGEAL ECHOCARDIOGRAM (TEE);  Surgeon: Nishan, Peter C, MD;  Location: [facility];  Service: Cardiovascular;  Laterality: N/A;   TOE SURGERY Right    replaced joint   TONSILLECTOMY     TUBAL LIGATION Bilateral 1976   VAGINAL DELIVERY     x2    Current Outpatient Medications  Medication Sig Dispense Refill   acetaminophen (TYLENOL) 325 MG tablet Take 2 tablets (650 mg total) by mouth every 4 (four) hours as needed for headache or mild pain.     apixaban (ELIQUIS) 5 MG TABS tablet Take 1 tablet (5 mg total) by mouth 2 (two) times daily.      ARNUITY ELLIPTA 100 MCG/ACT AEPB Inhale 1 puff into the lungs daily.     Azelastine HCl 137 MCG/SPRAY SOLN Place 1 spray into both nostrils as needed.     betamethasone valerate (VALISONE) 0.1 % cream Apply 1 application topically 2 (two) times daily as needed (itching.).     cholecalciferol (VITAMIN D3) 25 MCG (1000 UNIT) tablet Take 1,000 Units by mouth in the morning.     diltiazem (CARDIZEM CD) 120 MG 24 hr capsule Take 1 capsule (120 mg total) by mouth at bedtime. 30 capsule 3   diltiazem (CARDIZEM) 30 MG tablet Take 1 tablet every 4 hours AS NEEDED for AFIB heart rate >100 as long as top BP >100. 30 tablet 1   dofetilide (TIKOSYN) 250 MCG capsule TAKE 1 CAPSULE BY MOUTH TWICE DAILY IN THE MORNING AND DINNER FOR HEART 180 capsule 1   furosemide (LASIX) 20 MG tablet Take 1 tablet (20 mg total) by mouth daily as needed for fluid or edema (weight gain of 3 lbs in 24 hrs or 5 lbs in 1 week). 30 tablet 3   levocetirizine (XYZAL) 5 MG tablet Take 5 mg by mouth at bedtime.     loratadine (CLARITIN) 10 MG tablet Take 10 mg by mouth in the morning.     losartan (COZAAR) 25 MG tablet Take 25 mg by mouth in the morning.     Magnesium 500 MG TABS Take 500 mg by mouth at bedtime.     pantoprazole (PROTONIX) 40 MG tablet Take 40 mg by mouth 2 (two) times daily.     potassium chloride (KLOR-CON) 10 MEQ tablet TAKE 1 TABLET(10 MEQ) BY MOUTH DAILY 30 tablet 6   Probiotic Product (PROBIOTIC DAILY PO) Take 1 tablet by mouth every morning. May take 2 tablets as needed     propranolol (INDERAL) 20 MG tablet Take 1 tablet (20 mg total) by mouth 2 (two) times daily. 180 tablet 3   No current facility-administered medications for this encounter.    Allergies  Allergen Reactions   Caffeine     Heart palpitations   Epinephrine Palpitations      Social History   Socioeconomic History   Marital status: Married    Spouse name: Not on file   Number of children: 2   Years of education: Not on file   Highest  education level: Not on file  Occupational History   Not on file  Tobacco Use   Smoking status: Never   Smokeless tobacco: Never  Vaping Use   Vaping Use: Never used  Substance and Sexual Activity   Alcohol use: Yes    Alcohol/week: 14.0 standard drinks of alcohol    Types: 14 Glasses of wine per week    Comment: occasional   Drug use: No   Sexual activity: Yes    Birth control/protection: Other-see comments    Comment: tubal ligation  Other Topics Concern   Not on file  Social History Narrative   She is a married mother of 1. Lives with her husband. 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 She has never smoked and drinks up to 10 glasses of wine or so week.   She usually exercises roughly 3 days a week doing yoga and plays golf. She is mostly limited by her "time constraints "   Social Determinants of Health   Financial Resource Strain: Not on file  Food Insecurity: No Food Insecurity (03/31/2022)   Hunger Vital Sign    Worried About Running Out of Food in the Last Year: Never true    Ran Out of Food in the Last Year: Never true  Transportation Needs: No Transportation Needs (03/31/2022)   PRAPARE - Transportation    Lack of Transportation (Medical): No    Lack of Transportation (Non-Medical): No  Physical Activity: Not on file  Stress: Not on file  Social Connections: Not on file  Intimate Partner Violence: Not At Risk (03/31/2022)   Humiliation, Afraid, Rape, and Kick questionnaire    Fear of Current or Ex-Partner: No    Emotionally Abused: No    Physically Abused: No    Sexually Abused: No    Family History  Problem Relation Age of Onset   Heart Problems Mother    CVA Mother    Heart attack Mother 74   CVA Father    Colon cancer Neg Hx    Clotting disorder Neg Hx     ROS- All systems are reviewed and negative except as per the HPI above  Physical Exam: Vitals:   08/31/22 1517  BP: 134/80  Pulse: 84  Weight: 71.5 kg  Height: 5' 7.75" (1.721 m)     Wt Readings from Last 3 Encounters:  08/31/22 71.5 kg  08/11/22 71.8 kg  07/20/22 70 kg    Labs: Lab Results  Component Value Date   NA 141 07/13/2022   K 4.1 07/13/2022   CL 101 07/13/2022   CO2 27 07/13/2022   GLUCOSE 97 07/13/2022   BUN 30 (H) 07/13/2022   CREATININE 0.79 07/13/2022   CALCIUM 9.6 07/13/2022   MG 1.9 07/13/2022   Lab Results  Component Value Date   INR 1.1 03/17/2016   No results found for: "CHOL", "HDL", "LDLCALC", "TRIG"   GEN- The patient is a well appearing elderly female, alert and oriented x 3 today.   HEENT-head normocephalic, atraumatic, sclera clear, conjunctiva pink, hearing intact, trachea midline. Lungs- Clear to ausculation bilaterally, normal work of breathing Heart- irregular rate and rhythm, no murmurs, rubs or gallops  GI- soft, NT, ND, + BS Extremities- no clubbing, cyanosis, or edema MS- no significant deformity or   atrophy Skin- no rash or lesion Psych- euthymic mood, full affect Neuro- strength and sensation are intact   EKG today demonstrates Afib with intermittent V pacing Vent. rate 84 BPM PR interval * ms QRS duration 76 ms QT/QTcB 376/444 ms    CHA2DS2-VASc Score = 4  The patient's score is based upon: CHF History: 0 HTN History: 1 Diabetes History: 0 Stroke History: 0 Vascular Disease History: 0 Age Score: 2 Gender Score: 1       ASSESSMENT AND PLAN: 1. Persistent Atrial Fibrillation (ICD10:  I48.19) The patient's CHA2DS2-VASc score is 4, indicating a 4.8% annual risk of stroke.   Patient in persistent afib. We discussed rate vs rhythm control today. Will arrange for DCCV. If she has quick return of her afib, may need to consider this a failure of dofetilide and change to amiodarone. Patient hesitant to change to amiodarone at this time to avoid off target effects.  Continue dofetilide 250 mcg BID Continue Eliquis 5 mg BID Continue diltiazem 120 mg daily with 30 mg PRN for heart racing.  2.  Secondary Hypercoagulable State (ICD10:  D68.69) The patient is at significant risk for stroke/thromboembolism based upon her CHA2DS2-VASc Score of 4.  Continue Apixaban (Eliquis).   3. HTN Stable, no changes today.  4. Tachybradycardia syndrome S/p PPM, followed by Dr Lambert and the device clinic.   Follow up with Renee Urusy as scheduled. AF clinic in 6 months for dofetilide monitoring, sooner if needed to change AAD.    Ricky Zan Triska PA-C Afib [facility] [address] [place], [place] [address] [phone] 

## 2022-08-31 NOTE — Progress Notes (Signed)
Primary Care Physician: Wenda Low, MD Referring Physician:  device clinic EP: Dr. Birder Robson is a 82 y.o. female with a h/o atrial fib and PE/DVT 01/2016. She is has been treated with Rythmol and Tikosyn in the past. When her burden increased on Tikosyn, it was decided to pursue ablation 02/26/17.   She is in the afib clinic today, 04/10/19, for f/u of Tikosyn past ablation. She feels good. She has been staying in [place]. She has a linq, has intermittent palpitations but per Dr. Jackalyn Lombard note disorganized atrial activity, but not afib. She is leaving for [place] in Feb/March,2021 for a couple of months. Continues on eliquis 5 mg bid for a CHA2DS2VASc score of 4, no bleeding issues.  F/u in afib clinic, 07/18/19. She reports that she had Covid 19 the earlier part of the month while she and her husband were at the beach. They believe the exposure was from  2 furniture  delivery men  that were in their house for awhile, as they did not come into contact with other people and did not eat in restaurants.  She  and her husband both came down with it and received the antibody infusion at [facility] hospital. She overall felt better after that and she feels their symptoms were fairly mild. She did not have any afib while she was sick with covid. Continues on tikosyn and eliquis.  F/u in afib clinic, 08/27/19, pt called to office saying that she is seeing HR's by BP cuff in the 40's at home even though she feels well. We ran a Link report that shows PAC's as well as episodes of afib that usually  last around mins to one hour. Pt does not feel these episodes. Her longest afib episode was 50 mins on 2/26 and 2/27 with v rates 120-150, again pt did not feel this. EKG today shows SR with pac's. She feels well today.  F/u in afib clinic, 08/27/20. Afib has been quiet recently. She saw Dr. Rayann Heman 06/01/21 and by his assessment then, he did not feel she was a repeat ablation candidate. She continues on tikosyn. No  bleeding issues with eliquis.  F/u in afib clinic, 03/03/21. She remains on Tikosyn. One episode of afib that lasted around 24 hours and resolved on its own. She has an apple watch now so we discussed how she can track this as well as with her Linq. Last Linq report in September did not show any arrhythmia. Continues on eliquis 5 mg bid.  F/u in afib clinic, 10/13/21  for Tikosyn surveillance. Qt is stable. Only one episode of Afib lasting less than 8 hours. Remain complaint on Tikosyn.  She was dx with  early L breast CA in February, treated with lumpectomy and  will be finishing radiation soon. Has had a congested sough x 2 weeks. PCP has evaluated.   F/u in afib clinic, 12/28/21. She developed afib on Saturday. She has been complaint with Tikosyn and anticoagulation. No known triggers. She had her Linq explanted in May. She was in her garage this am and became lightheaded for a few seconds. Afther that she felt like she was back in [place]. EKG today confirms this. She has some stress recently which may have contributed to the afib.   F/u in afib clinic 10/10 for Tikosyn surveillance but also reports that she has a syncopal episode this past Thursday night after drinking around 3/4 glass of wine. She may not have eaten that much  that day. Her husband saw her fall, she was out for less than one minute.she was out of rhythm when she regained consciousness.  She states she did not hit her head. She did not seek any type of medial attention.  She is also describing episodes of feeling dizzy  for just a few seconds at a time.no further syncope. While running ekg today,she went from sinus brady at 57 bpm to accelerated  junctional at 122 bpm for just a few minutes. With the faster rate she felt dizzy.    F/u in afib clinic, 08/11/22. She was notified that she was back in afib by device clinic on Monday with increase afib burden and ongoing afib. EKG today shows afib at 75 bpm. She was not aware that she was in afib.  She just got back from  [place] and even being  on vacation, it was stressful. They have recently made a move to [place]  and she is not happy there. She was drinking around 1 1/2 glasses of wine a night and admitted to high salt intake.   Follow up in the AF clinic 08/31/22. Patient noted to be in persistent atrial fibrillation by the device clinic. Patient reports she has felt well and has not been aware of her arrhythmia. She might have more fatigue with exertion but is not sure about this. No bleeding issues on anticoagulation.   Today, she denies symptoms of palpitations, chest pain, shortness of breath, orthopnea, PND, lower extremity edema, dizziness, presyncope, syncope, or neurologic sequela. The patient is tolerating medications without difficulties and is otherwise without complaint today.   Past Medical History:  Diagnosis Date   Anxiety    Atrial fibrillation ([place])    Bilateral lower extremity edema    Chronic, likely related to venous stasis    DJD (degenerative joint disease), lumbosacral    Dyslipidemia    Dysrhythmia    A fib   Essential hypertension    "dx'd recently" (10/06/2016)   GERD (gastroesophageal reflux disease)    History of hepatitis C virus infection ~ 1997   In remission   Lung granuloma ([place]) 2004   CT scan   Mitral regurgitation    04/30/21 echo: bileaflet prolapse with moderate MR   Osteopenia    Paroxysmal atrial flutter ([place]) 04/02/2022   Paroxysmal atrial tachycardia    PE (pulmonary embolism) 01/2016   PONV (postoperative nausea and vomiting)    Patient states she had extremem nausea after neck surgery several years ago   Spondylolisthesis    Status post epidural injections 3 by neurosurgery  - Dr. Joya Salm    Varicose veins of both legs with edema    Vitamin D deficiency    Past Surgical History:  Procedure Laterality Date   ATRIAL FIBRILLATION ABLATION N/A 03/09/2017   Procedure: Atrial Fibrillation Ablation;  Surgeon: [name], MD;   Location: [facility];  Service: Cardiovascular;  Laterality: N/A;   BACK SURGERY     BREAST LUMPECTOMY WITH RADIOACTIVE SEED LOCALIZATION Left 08/12/2021   Procedure: LEFT BREAST LUMPECTOMY WITH RADIOACTIVE SEED LOCALIZATION;  Surgeon: Donnie Mesa, MD;  Location: [facility];  Service: General;  Laterality: Left;   CARDIOVERSION N/A 03/21/2016   Procedure: CARDIOVERSION;  Surgeon: [name], MD;  Location: [facility];  Service: Cardiovascular;  Laterality: N/A;   COMBINED HYSTEROSCOPY DIAGNOSTIC / D&C  2005   COMBINED HYSTEROSCOPY DIAGNOSTIC / D&C  1999   DILATION AND CURETTAGE OF UTERUS     implantable loop  recorder removal  11/10/2021   LOOP RECORDER INSERTION N/A 10/31/2017   Procedure: LOOP RECORDER INSERTION;  Surgeon: [name], MD;  Location: [facility];  Service: Cardiovascular;  Laterality: N/A;   [place]   "went in front and back; broken neck"   PACEMAKER IMPLANT N/A 04/01/2022   Procedure: PACEMAKER IMPLANT;  Surgeon: Vickie Epley, MD;  Location: [facility];  Service: Cardiovascular;  Laterality: N/A;   RE-EXCISION OF BREAST CANCER,SUPERIOR MARGINS Left 08/25/2021   Procedure: RE-EXCISION OF SUPERIOR MARGIN -  LEFT BREAST LUMPECTOMY;  Surgeon: Donnie Mesa, MD;  Location: [facility];  Service: General;  Laterality: Left;   TEE WITHOUT CARDIOVERSION N/A 03/08/2017   Procedure: TRANSESOPHAGEAL ECHOCARDIOGRAM (TEE);  Surgeon: Josue Hector, MD;  Location[facility];  Service: Cardiovascular;  Laterality: N/A;   TOE SURGERY Right    replaced joint   TONSILLECTOMY     TUBAL LIGATION Bilateral 1976   VAGINAL DELIVERY     x2    Current Outpatient Medications  Medication Sig Dispense Refill   acetaminophen (TYLENOL) 325 MG tablet Take 2 tablets (650 mg total) by mouth every 4 (four) hours as needed for headache or mild pain.     apixaban (ELIQUIS) 5 MG TABS tablet Take 1 tablet (5 mg total) by mouth 2 (two) times daily.      ARNUITY ELLIPTA 100 MCG/ACT AEPB Inhale 1 puff into the lungs daily.     Azelastine HCl 137 MCG/SPRAY SOLN Place 1 spray into both nostrils as needed.     betamethasone valerate (VALISONE) 0.1 % cream Apply 1 application topically 2 (two) times daily as needed (itching.).     cholecalciferol (VITAMIN D3) 25 MCG (1000 UNIT) tablet Take 1,000 Units by mouth in the morning.     diltiazem (CARDIZEM CD) 120 MG 24 hr capsule Take 1 capsule (120 mg total) by mouth at bedtime. 30 capsule 3   diltiazem (CARDIZEM) 30 MG tablet Take 1 tablet every 4 hours AS NEEDED for AFIB heart rate >100 as long as top BP >100. 30 tablet 1   dofetilide (TIKOSYN) 250 MCG capsule TAKE 1 CAPSULE BY MOUTH TWICE DAILY IN THE MORNING AND DINNER FOR HEART 180 capsule 1   furosemide (LASIX) 20 MG tablet Take 1 tablet (20 mg total) by mouth daily as needed for fluid or edema (weight gain of 3 lbs in 24 hrs or 5 lbs in 1 week). 30 tablet 3   levocetirizine (XYZAL) 5 MG tablet Take 5 mg by mouth at bedtime.     loratadine (CLARITIN) 10 MG tablet Take 10 mg by mouth in the morning.     losartan (COZAAR) 25 MG tablet Take 25 mg by mouth in the morning.     Magnesium 500 MG TABS Take 500 mg by mouth at bedtime.     pantoprazole (PROTONIX) 40 MG tablet Take 40 mg by mouth 2 (two) times daily.     potassium chloride (KLOR-CON) 10 MEQ tablet TAKE 1 TABLET(10 MEQ) BY MOUTH DAILY 30 tablet 6   Probiotic Product (PROBIOTIC DAILY PO) Take 1 tablet by mouth every morning. May take 2 tablets as needed     propranolol (INDERAL) 20 MG tablet Take 1 tablet (20 mg total) by mouth 2 (two) times daily. 180 tablet 3   No current facility-administered medications for this encounter.    Allergies  Allergen Reactions   Caffeine     Heart palpitations   Epinephrine Palpitations  Social History   Socioeconomic History   Marital status: Married    Spouse name: Not on file   Number of children: 2   Years of education: Not on file   Highest  education level: Not on file  Occupational History   Not on file  Tobacco Use   Smoking status: Never   Smokeless tobacco: Never  Vaping Use   Vaping Use: Never used  Substance and Sexual Activity   Alcohol use: Yes    Alcohol/week: 14.0 standard drinks of alcohol    Types: 14 Glasses of wine per week    Comment: occasional   Drug use: No   Sexual activity: Yes    Birth control/protection: Other-see comments    Comment: tubal ligation  Other Topics Concern   Not on file  Social History Narrative   She is a married mother of 1. Lives with her husband. 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 She has never smoked and drinks up to 10 glasses of wine or so week.   She usually exercises roughly 3 days a week doing yoga and plays golf. She is mostly limited by her "time constraints "   Social Determinants of Health   Financial Resource Strain: Not on file  Food Insecurity: No Food Insecurity (03/31/2022)   Hunger Vital Sign    Worried About Running Out of Food in the Last Year: Never true    Ran Out of Food in the Last Year: Never true  Transportation Needs: No Transportation Needs (03/31/2022)   PRAPARE - Hydrologist (Medical): No    Lack of Transportation (Non-Medical): No  Physical Activity: Not on file  Stress: Not on file  Social Connections: Not on file  Intimate Partner Violence: Not At Risk (03/31/2022)   Humiliation, Afraid, Rape, and Kick questionnaire    Fear of Current or Ex-Partner: No    Emotionally Abused: No    Physically Abused: No    Sexually Abused: No    Family History  Problem Relation Age of Onset   Heart Problems Mother    CVA Mother    Heart attack Mother 39   CVA Father    Colon cancer Neg Hx    Clotting disorder Neg Hx     ROS- All systems are reviewed and negative except as per the HPI above  Physical Exam: Vitals:   08/31/22 1517  BP: 134/80  Pulse: 84  Weight: 71.5 kg  Height: 5' 7.75" (1.721 m)     Wt Readings from Last 3 Encounters:  08/31/22 71.5 kg  08/11/22 71.8 kg  07/20/22 70 kg    Labs: Lab Results  Component Value Date   NA 141 07/13/2022   K 4.1 07/13/2022   CL 101 07/13/2022   CO2 27 07/13/2022   GLUCOSE 97 07/13/2022   BUN 30 (H) 07/13/2022   CREATININE 0.79 07/13/2022   CALCIUM 9.6 07/13/2022   MG 1.9 07/13/2022   Lab Results  Component Value Date   INR 1.1 03/17/2016   No results found for: "CHOL", "HDL", "LDLCALC", "TRIG"   GEN- The patient is a well appearing elderly female, alert and oriented x 3 today.   HEENT-head normocephalic, atraumatic, sclera clear, conjunctiva pink, hearing intact, trachea midline. Lungs- Clear to ausculation bilaterally, normal work of breathing Heart- irregular rate and rhythm, no murmurs, rubs or gallops  GI- soft, NT, ND, + BS Extremities- no clubbing, cyanosis, or edema MS- no significant deformity or  atrophy Skin- no rash or lesion Psych- euthymic mood, full affect Neuro- strength and sensation are intact   EKG today demonstrates Afib with intermittent V pacing Vent. rate 84 BPM PR interval * ms QRS duration 76 ms QT/QTcB 376/444 ms    CHA2DS2-VASc Score = 4  The patient's score is based upon: CHF History: 0 HTN History: 1 Diabetes History: 0 Stroke History: 0 Vascular Disease History: 0 Age Score: 2 Gender Score: 1       ASSESSMENT AND PLAN: 1. Persistent Atrial Fibrillation (ICD10:  I48.19) The patient's CHA2DS2-VASc score is 4, indicating a 4.8% annual risk of stroke.   Patient in persistent afib. We discussed rate vs rhythm control today. Will arrange for DCCV. If she has quick return of her afib, may need to consider this a failure of dofetilide and change to amiodarone. Patient hesitant to change to amiodarone at this time to avoid off target effects.  Continue dofetilide 250 mcg BID Continue Eliquis 5 mg BID Continue diltiazem 120 mg daily with 30 mg PRN for heart racing.  2.  Secondary Hypercoagulable State (ICD10:  D68.69) The patient is at significant risk for stroke/thromboembolism based upon her CHA2DS2-VASc Score of 4.  Continue Apixaban (Eliquis).   3. HTN Stable, no changes today.  4. Tachybradycardia syndrome S/p PPM, followed by Dr Quentin Ore and the device clinic.   Follow up with Dillon Bjork as scheduled. AF clinic in 6 months for dofetilide monitoring, sooner if needed to change AAD.    [facility] [address] [place], [place] [address] [phone]

## 2022-09-01 ENCOUNTER — Other Ambulatory Visit (HOSPITAL_COMMUNITY): Payer: Self-pay | Admitting: *Deleted

## 2022-09-01 DIAGNOSIS — I4819 Other persistent atrial fibrillation: Secondary | ICD-10-CM

## 2022-09-05 DIAGNOSIS — Z6826 Body mass index (BMI) 26.0-26.9, adult: Secondary | ICD-10-CM | POA: Diagnosis not present

## 2022-09-05 DIAGNOSIS — N952 Postmenopausal atrophic vaginitis: Secondary | ICD-10-CM | POA: Diagnosis not present

## 2022-09-05 DIAGNOSIS — Z01419 Encounter for gynecological examination (general) (routine) without abnormal findings: Secondary | ICD-10-CM | POA: Diagnosis not present

## 2022-09-08 ENCOUNTER — Telehealth (HOSPITAL_COMMUNITY): Payer: Self-pay | Admitting: *Deleted

## 2022-09-08 NOTE — Telephone Encounter (Signed)
Pt left message stating she is feeling much better and wonders if she is back in NSR - she is sending a transmission to confirm rhythm as she is scheduled for cardioversion 4/1.

## 2022-09-09 NOTE — Telephone Encounter (Signed)
Pt notified still in AFib to continue with current plan of cardioversion as scheduled.

## 2022-09-16 ENCOUNTER — Ambulatory Visit (HOSPITAL_COMMUNITY)
Admission: RE | Admit: 2022-09-16 | Discharge: 2022-09-16 | Disposition: A | Discharge status: Home / Self Care | Payer: PPO | Source: Ambulatory Visit | Attending: Nurse Practitioner | Admitting: Nurse Practitioner

## 2022-09-16 DIAGNOSIS — I4892 Unspecified atrial flutter: Secondary | ICD-10-CM | POA: Insufficient documentation

## 2022-09-16 LAB — BASIC METABOLIC PANEL
Anion gap: 9 (ref 5–15)
BUN: 30 mg/dL — ABNORMAL HIGH (ref 8–23)
CO2: 26 mmol/L (ref 22–32)
Calcium: 9.4 mg/dL (ref 8.9–10.3)
Chloride: 106 mmol/L (ref 98–111)
Creatinine, Ser: 1 mg/dL (ref 0.44–1.00)
GFR, Estimated: 56 mL/min — ABNORMAL LOW (ref >60)
Glucose, Bld: 74 mg/dL (ref 70–99)
Potassium: 4.4 mmol/L (ref 3.5–5.1)
Sodium: 141 mmol/L (ref 135–145)

## 2022-09-16 LAB — CBC
HCT: 38.2 % (ref 36.0–46.0)
Hemoglobin: 12.3 g/dL (ref 12.0–15.0)
MCH: 31.7 pg (ref 26.0–34.0)
MCHC: 32.2 g/dL (ref 30.0–36.0)
MCV: 98.5 fL (ref 80.0–100.0)
Platelets: 194 10*3/uL (ref 150–400)
RBC: 3.88 MIL/uL (ref 3.87–5.11)
RDW: 12.9 % (ref 11.5–15.5)
WBC: 4.8 10*3/uL (ref 4.0–10.5)
nRBC: 0 % (ref 0.0–0.2)

## 2022-09-16 LAB — MAGNESIUM: Magnesium: 2.1 mg/dL (ref 1.7–2.4)

## 2022-09-16 NOTE — Progress Notes (Signed)
Remote pacemaker transmission.   

## 2022-09-19 ENCOUNTER — Ambulatory Visit (HOSPITAL_COMMUNITY)
Admission: RE | Admit: 2022-09-19 | Discharge: 2022-09-19 | Disposition: A | Discharge status: Home / Self Care | Payer: PPO | Attending: Cardiovascular Disease | Admitting: Cardiovascular Disease

## 2022-09-19 ENCOUNTER — Ambulatory Visit (HOSPITAL_BASED_OUTPATIENT_CLINIC_OR_DEPARTMENT_OTHER): Payer: PPO | Admitting: Anesthesiology

## 2022-09-19 ENCOUNTER — Other Ambulatory Visit: Payer: Self-pay

## 2022-09-19 ENCOUNTER — Ambulatory Visit (HOSPITAL_COMMUNITY): Payer: PPO | Admitting: Anesthesiology

## 2022-09-19 ENCOUNTER — Encounter (HOSPITAL_COMMUNITY)
Admission: RE | Disposition: A | Discharge status: Home / Self Care | Payer: Self-pay | Source: Home / Self Care | Attending: Cardiovascular Disease

## 2022-09-19 ENCOUNTER — Encounter (HOSPITAL_COMMUNITY): Payer: Self-pay | Admitting: Cardiovascular Disease

## 2022-09-19 DIAGNOSIS — I4819 Other persistent atrial fibrillation: Secondary | ICD-10-CM | POA: Insufficient documentation

## 2022-09-19 DIAGNOSIS — Z923 Personal history of irradiation: Secondary | ICD-10-CM | POA: Diagnosis not present

## 2022-09-19 DIAGNOSIS — I495 Sick sinus syndrome: Secondary | ICD-10-CM | POA: Diagnosis not present

## 2022-09-19 DIAGNOSIS — I4891 Unspecified atrial fibrillation: Secondary | ICD-10-CM | POA: Diagnosis not present

## 2022-09-19 DIAGNOSIS — Z950 Presence of cardiac pacemaker: Secondary | ICD-10-CM | POA: Insufficient documentation

## 2022-09-19 DIAGNOSIS — I340 Nonrheumatic mitral (valve) insufficiency: Secondary | ICD-10-CM | POA: Diagnosis not present

## 2022-09-19 DIAGNOSIS — Z8616 Personal history of COVID-19: Secondary | ICD-10-CM | POA: Diagnosis not present

## 2022-09-19 DIAGNOSIS — I10 Essential (primary) hypertension: Secondary | ICD-10-CM | POA: Diagnosis not present

## 2022-09-19 DIAGNOSIS — Z7901 Long term (current) use of anticoagulants: Secondary | ICD-10-CM | POA: Diagnosis not present

## 2022-09-19 DIAGNOSIS — D6869 Other thrombophilia: Secondary | ICD-10-CM | POA: Diagnosis not present

## 2022-09-19 DIAGNOSIS — Z853 Personal history of malignant neoplasm of breast: Secondary | ICD-10-CM | POA: Diagnosis not present

## 2022-09-19 HISTORY — PX: CARDIOVERSION: SHX1299

## 2022-09-19 SURGERY — CARDIOVERSION
Anesthesia: General

## 2022-09-19 MED ORDER — PROPOFOL 10 MG/ML IV BOLUS
INTRAVENOUS | Status: DC | PRN
  Administered 2022-09-19: 70 mg via INTRAVENOUS

## 2022-09-19 MED ORDER — SODIUM CHLORIDE 0.9 % IV SOLN: INTRAVENOUS | Status: DC

## 2022-09-19 MED ORDER — LIDOCAINE 2% (20 MG/ML) 5 ML SYRINGE
INTRAMUSCULAR | Status: DC | PRN
  Administered 2022-09-19: 60 mg via INTRAVENOUS

## 2022-09-19 NOTE — Anesthesia Preprocedure Evaluation (Signed)
Anesthesia Evaluation  Patient identified by MRN, date of birth, ID band Patient awake    Reviewed: Allergy & Precautions, H&P , NPO status , Patient's Chart, lab work & pertinent test results  Airway Mallampati: II  TM Distance: >3 FB Neck ROM: Full    Dental no notable dental hx.    Pulmonary neg pulmonary ROS   Pulmonary exam normal breath sounds clear to auscultation       Cardiovascular hypertension, + dysrhythmias Atrial Fibrillation + Valvular Problems/Murmurs MR  Rhythm:Regular Rate:Normal + Systolic murmurs    Neuro/Psych negative neurological ROS  negative psych ROS   GI/Hepatic negative GI ROS, Neg liver ROS,,,  Endo/Other  negative endocrine ROS    Renal/GU negative Renal ROS  negative genitourinary   Musculoskeletal negative musculoskeletal ROS (+)    Abdominal   Peds negative pediatric ROS (+)  Hematology negative hematology ROS (+)   Anesthesia Other Findings   Reproductive/Obstetrics negative OB ROS                             Anesthesia Physical Anesthesia Plan  ASA: 3  Anesthesia Plan: General   Post-op Pain Management: Minimal or no pain anticipated   Induction: Intravenous  PONV Risk Score and Plan: Treatment may vary due to age or medical condition  Airway Management Planned: Mask  Additional Equipment:   Intra-op Plan:   Post-operative Plan:   Informed Consent: I have reviewed the patients History and Physical, chart, labs and discussed the procedure including the risks, benefits and alternatives for the proposed anesthesia with the patient or authorized representative who has indicated his/her understanding and acceptance.     Dental advisory given  Plan Discussed with: CRNA and Surgeon  Anesthesia Plan Comments:        Anesthesia Quick Evaluation

## 2022-09-19 NOTE — Discharge Instructions (Signed)

## 2022-09-19 NOTE — Interval H&P Note (Signed)
History and Physical Interval Note:  09/19/2022 7:21 AM  Christy [name]  has presented today for surgery, with the diagnosis of afib.  The various methods of treatment have been discussed with the patient and family. After consideration of risks, benefits and other options for treatment, the patient has consented to  Procedure(s): CARDIOVERSION (N/A) as a surgical intervention.  The patient's history has been reviewed, patient examined, no change in status, stable for surgery.  I have reviewed the patient's chart and labs.  Questions were answered to the patient's satisfaction.     Jenkins Rouge

## 2022-09-19 NOTE — Transfer of Care (Signed)
Immediate Anesthesia Transfer of Care Note  Patient: Christy [name]  Procedure(s) Performed: CARDIOVERSION  Patient Location: Endoscopy Unit  Anesthesia Type:MAC  Level of Consciousness: sedated  Airway & Oxygen Therapy: Patient Spontanous Breathing  Post-op Assessment: Report given to RN and Post -op Vital signs reviewed and stable  Post vital signs: Reviewed and stable  Last Vitals:  Vitals Value Taken Time  BP 144/82 09/19/22 0850  Temp 36.6 C 09/19/22 0822  Pulse 60 09/19/22 0851  Resp 10 09/19/22 0851  SpO2 97 % 09/19/22 0851  Vitals shown include unvalidated device data.  Last Pain:  Vitals:   09/19/22 0850  TempSrc:   PainSc: (P) 0-No pain         Complications: No notable events documented.

## 2022-09-19 NOTE — CV Procedure (Signed)
[facility]: Anesthesia:  Dr Kalman Shan Propofol/Lidocaine On Rx anticoagulation with no missed doses  DCC x 1 120 J   Converted to SR with V pacing rate 70   No immediate neurologic sequelae [employer] to interrogate PPM post shock  Jenkins Rouge MD [facility]

## 2022-09-19 NOTE — Anesthesia Postprocedure Evaluation (Signed)
Anesthesia Post Note  Patient: Christy [name]  Procedure(s) Performed: CARDIOVERSION     Patient location during evaluation: PACU Anesthesia Type: General Level of consciousness: awake and alert Pain management: pain level controlled Vital Signs Assessment: post-procedure vital signs reviewed and stable Respiratory status: spontaneous breathing, nonlabored ventilation, respiratory function stable and patient connected to nasal cannula oxygen Cardiovascular status: blood pressure returned to baseline and stable Postop Assessment: no apparent nausea or vomiting Anesthetic complications: no  No notable events documented.  Last Vitals:  Vitals:   09/19/22 0830 09/19/22 0841  BP: 120/77 136/73  Pulse: 64 61  Resp: 13 12  Temp:    SpO2: 95% 96%    Last Pain:  Vitals:   09/19/22 0841  TempSrc:   PainSc: 0-No pain                 Kristell Wooding S

## 2022-09-20 ENCOUNTER — Encounter (HOSPITAL_COMMUNITY): Payer: Self-pay | Admitting: Cardiovascular Disease

## 2022-09-21 ENCOUNTER — Telehealth: Payer: Self-pay

## 2022-09-21 NOTE — Telephone Encounter (Signed)
Device alert for AF, which has resumed post DCCV 4/1 Eliquis, Diltiazem and Tikosyn per EPIC Route to triage

## 2022-09-21 NOTE — Telephone Encounter (Signed)
Will forward to [facility] as her follow up post cardioversion is with her.

## 2022-09-26 NOTE — Telephone Encounter (Signed)
Pt called as she felt back in afib. Confirmed this with her. She is aware to continue all medications until follow up on 4/22. Pt will call if issues arise prior to this.

## 2022-10-04 ENCOUNTER — Ambulatory Visit (INDEPENDENT_AMBULATORY_CARE_PROVIDER_SITE_OTHER): Payer: PPO

## 2022-10-04 DIAGNOSIS — R55 Syncope and collapse: Secondary | ICD-10-CM

## 2022-10-04 LAB — CUP PACEART REMOTE DEVICE CHECK
Battery Remaining Longevity: 162 mo
Battery Voltage: 3.12 V
Brady Statistic RA Percent Paced: 1.46 %
Brady Statistic RV Percent Paced: 51.52 %
Date Time Interrogation Session: 20240415212211
Implantable Lead Connection Status: 753985
Implantable Lead Connection Status: 753985
Implantable Lead Implant Date: 20231013
Implantable Lead Implant Date: 20231013
Implantable Lead Location: 753859
Implantable Lead Location: 753860
Implantable Lead Model: 3830
Implantable Lead Model: 5076
Implantable Pulse Generator Implant Date: 20231013
Lead Channel Impedance Value: 304 Ohm
Lead Channel Impedance Value: 380 Ohm
Lead Channel Impedance Value: 475 Ohm
Lead Channel Impedance Value: 570 Ohm
Lead Channel Pacing Threshold Amplitude: 0.5 V
Lead Channel Pacing Threshold Amplitude: 0.75 V
Lead Channel Pacing Threshold Pulse Width: 0.4 ms
Lead Channel Pacing Threshold Pulse Width: 0.4 ms
Lead Channel Sensing Intrinsic Amplitude: 0.375 mV
Lead Channel Sensing Intrinsic Amplitude: 0.375 mV
Lead Channel Sensing Intrinsic Amplitude: 3.25 mV
Lead Channel Sensing Intrinsic Amplitude: 3.25 mV
Lead Channel Setting Pacing Amplitude: 1.5 V
Lead Channel Setting Pacing Amplitude: 2 V
Lead Channel Setting Pacing Pulse Width: 0.4 ms
Lead Channel Setting Sensing Sensitivity: 1.2 mV
Zone Setting Status: 755011

## 2022-10-09 NOTE — Progress Notes (Signed)
Cardiology Office Note Date:  10/09/2022  Patient ID:  Christy [name], Christy [name] 1941-01-06, MRN [id number] PCP:  Georgann Housekeeper, MD  Electrophysiologist: Dr.  Johney Frame >> Dr. Lalla Brothers    Chief Complaint:   3 mo  History of Present Illness: Christy [name] is a 82 y.o. female with history of PE/DVT (2017)AFib/flutter, HTN, breast cancer, tachy-brady w/PPM.  Admitted to [facility] 03/31/22, she saw Lupita Leash on 03/29/22, she reported a syncopal event last week and a monitor was placed. In the clinic she was noted on ECG to have what appears junctional tachycardia 110's or so with rapid drop to SB 50's The AFib clinic got an alert that she had a 8 second pause, in d/w her she was awake and did get lightheaded with it. She reports an up-tick in her palpitations of late, feels like she is having more Afib as well,  has always been aware of the her heart rate/rhythm, with some mild fleeting dizziness, though last week was the 1st time she has fainted Underwent PPM 04/01/22 Discharged 04/02/22  I saw her 04/18/22 She thinks she is healing well. She mentions that she has been quite anxious, [place] recently moved to [place] a cottage there, this has been a big [place] transition for her, and now having to get a PPM. Her PMD has prescribed something for her, but she has not picked it up, can't decide if she wants to try it or not. No CP, no site concerns. She has had some Afib though. No near syncope or syncope. No SOB No bleeding or signs of bleeding Site was well healed, QTc stable  She has seen Dr. Lalla Brothers and Afib  clinic since then.  Most recently Afib  clinic 08/31/22, persistent AFib, planned for DCCV discussed amio as a next option, pt hopeful to avoid it with the potential side effects.  DCCV 09/19/22 successful Recurrent AF 4/3  TODAY She is accompanied by her husband. Generally fatigued, but otherwise feels well. Interestingly her husband has noted in comparison to 3-5 mo ago, she is more  active and doing better. [place] the [place], the elevator out and going up/down stairs (3 flights) without difficulty. She has less if any cardiac awareness then she used to, no racing heart rates. Largely unaware of her Afib for the most part. No CP, SOB No near syncope or syncope. No bleeding or signs of bleeding Good medication compliance  She is very weary of amiodarone, inquires if she would be a repeat ablation candidate   Device information MDT dual chamber PPM implant RV lead in LB area  AF/AAD Hx Diagnosed 2017 PVI ablation 03/09/2017 Flecainide and rythmol remotely Tikosyn 2018 ILR 2019 > out May 2023   Past Medical History:  Diagnosis Date   Anxiety    Atrial fibrillation    Bilateral lower extremity edema    Chronic, likely related to venous stasis    DJD (degenerative joint disease), lumbosacral    Dyslipidemia    Dysrhythmia    A fib   Essential hypertension    "dx'd recently" (10/06/2016)   GERD (gastroesophageal reflux disease)    History of hepatitis C virus infection ~ 1997   In remission   Lung granuloma 2004   CT scan   Mitral regurgitation    04/30/21 echo: bileaflet prolapse with moderate MR   Osteopenia    Paroxysmal atrial flutter 04/02/2022   Paroxysmal atrial tachycardia    PE (pulmonary embolism) 01/2016  PONV (postoperative nausea and vomiting)    Patient states she had extremem nausea after neck surgery several years ago   Spondylolisthesis    Status post epidural injections 3 by neurosurgery  - Dr. Jeral Fruit    Varicose veins of both legs with edema    Vitamin D deficiency     Past Surgical History:  Procedure Laterality Date   ATRIAL FIBRILLATION ABLATION N/A 03/09/2017   Procedure: Atrial Fibrillation Ablation;  Surgeon: Hillis Range, MD;  Location: [facility] INVASIVE CV LAB;  Service: Cardiovascular;  Laterality: N/A;   BACK SURGERY     BREAST LUMPECTOMY WITH RADIOACTIVE SEED LOCALIZATION Left 08/12/2021   Procedure:  LEFT BREAST LUMPECTOMY WITH RADIOACTIVE SEED LOCALIZATION;  Surgeon: Manus Rudd, MD;  Location: [facility] OR;  Service: General;  Laterality: Left;   CARDIOVERSION N/A 03/21/2016   Procedure: CARDIOVERSION;  Surgeon: Lars Masson, MD;  Location: [facility] ENDOSCOPY;  Service: Cardiovascular;  Laterality: N/A;   CARDIOVERSION N/A 09/19/2022   Procedure: CARDIOVERSION;  Surgeon: Wendall Stade, MD;  Location: [facility] ENDOSCOPY;  Service: Cardiovascular;  Laterality: N/A;   COMBINED HYSTEROSCOPY DIAGNOSTIC / D&C  2005   COMBINED HYSTEROSCOPY DIAGNOSTIC / D&C  1999   DILATION AND CURETTAGE OF UTERUS     implantable loop recorder removal  11/10/2021   LOOP RECORDER INSERTION N/A 10/31/2017   Procedure: LOOP RECORDER INSERTION;  Surgeon: Hillis Range, MD;  Location: [facility] INVASIVE CV LAB;  Service: Cardiovascular;  Laterality: N/A;   NECK SURGERY  1995   "went in front and back; broken neck"   PACEMAKER IMPLANT N/A 04/01/2022   Procedure: PACEMAKER IMPLANT;  Surgeon: Lanier Prude, MD;  Location: [facility] INVASIVE CV LAB;  Service: Cardiovascular;  Laterality: N/A;   RE-EXCISION OF BREAST CANCER,SUPERIOR MARGINS Left 08/25/2021   Procedure: RE-EXCISION OF SUPERIOR MARGIN -  LEFT BREAST LUMPECTOMY;  Surgeon: Manus Rudd, MD;  Location: [facility] OR;  Service: General;  Laterality: Left;   TEE WITHOUT CARDIOVERSION N/A 03/08/2017   Procedure: TRANSESOPHAGEAL ECHOCARDIOGRAM (TEE);  Surgeon: Wendall Stade, MD;  Location: [facility] ENDOSCOPY;  Service: Cardiovascular;  Laterality: N/A;   TOE SURGERY Right    replaced joint   TONSILLECTOMY     TUBAL LIGATION Bilateral 1976   VAGINAL DELIVERY     x2    Current Outpatient Medications  Medication Sig Dispense Refill   acetaminophen (TYLENOL) 325 MG tablet Take 2 tablets (650 mg total) by mouth every 4 (four) hours as needed for headache or mild pain.     apixaban (ELIQUIS) 5 MG TABS tablet Take 1 tablet (5 mg total) by mouth 2 (two) times daily.     ARNUITY ELLIPTA 100  MCG/ACT AEPB Inhale 1 puff into the lungs daily as needed (allergies).     betamethasone valerate (VALISONE) 0.1 % cream Apply 1 application topically 2 (two) times daily as needed (itching.).     Cholecalciferol (VITAMIN D) 125 MCG (5000 UT) CAPS Take 5,000 Units by mouth in the morning.     diltiazem (CARDIZEM CD) 120 MG 24 hr capsule Take 1 capsule (120 mg total) by mouth at bedtime. 30 capsule 3   diltiazem (CARDIZEM) 30 MG tablet Take 1 tablet every 4 hours AS NEEDED for AFIB heart rate >100 as long as top BP >100. 30 tablet 1   dofetilide (TIKOSYN) 250 MCG capsule TAKE 1 CAPSULE BY MOUTH TWICE DAILY IN THE MORNING AND DINNER FOR HEART 180 capsule 1   furosemide (LASIX) 20 MG tablet Take 1 tablet (20 mg  total) by mouth daily as needed for fluid or edema (weight gain of 3 lbs in 24 hrs or 5 lbs in 1 week). 30 tablet 3   levocetirizine (XYZAL) 5 MG tablet Take 5 mg by mouth at bedtime.     losartan (COZAAR) 25 MG tablet Take 25 mg by mouth in the morning.     Magnesium 250 MG TABS Take 250 mg by mouth at bedtime.     pantoprazole (PROTONIX) 40 MG tablet Take 40 mg by mouth 2 (two) times daily.     potassium chloride (KLOR-CON) 10 MEQ tablet TAKE 1 TABLET(10 MEQ) BY MOUTH DAILY 30 tablet 6   Probiotic Product (PROBIOTIC DAILY PO) Take 1 tablet by mouth every morning. May take 2 tablets as needed     propranolol (INDERAL) 20 MG tablet Take 1 tablet (20 mg total) by mouth 2 (two) times daily. 180 tablet 3   No current facility-administered medications for this visit.    Allergies:   Caffeine and Epinephrine   Social History:  The patient  reports that she has never smoked. She has never used smokeless tobacco. She reports current alcohol use of about 14.0 standard drinks of alcohol per week. She reports that she does not use drugs.   Family History:  The patient's family history includes CVA in her father and mother; Heart Problems in her mother; Heart attack (age of onset: 46) in her  mother.  ROS:  Please see the history of present illness.    All other systems are reviewed and otherwise negative.   PHYSICAL EXAM:  VS:  There were no vitals taken for this visit. BMI: There is no height or weight on file to calculate BMI. Well nourished, well developed, in no acute distress HEENT: normocephalic, atraumatic Neck: no JVD, carotid bruits or masses Cardiac:  RRR; no significant murmurs, no rubs, or gallops Lungs: CTA b/l, no wheezing, rhonchi or rales Abd: soft, nontender MS: no deformity or atrophy Ext: no edema Skin: warm and dry, no rash Neuro:  No gross deficits appreciated Psych: euthymic mood, full affect  PPM site: is stable, no erythema, edema, fluctuation or heat, not tender, no tethering   EKG:  done today and reviewed by myself AFib/perhaps flutter, some V pacing and intrinsic beats, PVC, QTc  Device interrogation done today and reviewed by myself:  Battery and lead measurements are good She has Atrial therapies on, without success 99.2%AF burden    03/31/2022: TTE  1. Left ventricular ejection fraction, by estimation, is 60 to 65%. The  left ventricle has normal function. The left ventricle has no regional  wall motion abnormalities. There is mild concentric left ventricular  hypertrophy. Left ventricular diastolic  parameters are consistent with Grade II diastolic dysfunction  (pseudonormalization).   2. Right ventricular systolic function is normal. The right ventricular  size is normal. There is normal pulmonary artery systolic pressure.   3. Left atrial size was severely dilated.   4. Right atrial size was severely dilated.   5. The mitral valve is abnormal. Mild to moderate mitral valve  regurgitation. No evidence of mitral stenosis. There is mild holosystolic  prolapse of anterior leaflet of the mitral valve.   6. The aortic valve is tricuspid. Aortic valve regurgitation is mild. No  aortic stenosis is present.   7. The inferior  vena cava is normal in size with greater than 50%  respiratory variability, suggesting right atrial pressure of 3 mmHg.   04/30/2021: TTE 1. Left  ventricular ejection fraction, by estimation, is 65 to 70%. The  left ventricle has normal function. The left ventricle has no regional  wall motion abnormalities. There is moderate asymmetric left ventricular  hypertrophy of the basal-septal  segment. Left ventricular diastolic parameters were normal.   2. Right ventricular systolic function is normal. The right ventricular  size is mildly enlarged. There is mildly elevated pulmonary artery  systolic pressure. The estimated right ventricular systolic pressure is  34.8 mmHg.   3. Left atrial size was severely dilated.   4. Right atrial size was severely dilated.   5. The mitral valve is degenerative. Bileaflet prolapse. Moderate mitral  valve regurgitation.   6. Pulmonic valve regurgitation is mild to moderate.   7. The aortic valve is tricuspid. Aortic valve regurgitation is trivial.  No aortic stenosis is present.   03/09/2017: EPS/ablation CONCLUSIONS: 1. Sinus rhythm upon presentation.   2. Intracardiac echo reveals a moderately enlarged sized left atrium  3. Successful electrical isolation and anatomical encircling of all four pulmonary veins with radiofrequency current. 4. Ectopic atrial tachycardia ablated along the roof of the right atrium 5. Multiple atypical atrial flutter circuits, too numerous and unstable for mapping and ablation 6. Dual AV nodal physiology without inducible AVNRT.  No retrograde AV conduction at baseline 7. No early apparent complications.   Recent Labs: 09/16/2022: BUN 30; Creatinine, Ser 1.00; Hemoglobin 12.3; Magnesium 2.1; Platelets 194; Potassium 4.4; Sodium 141  No results found for requested labs within last 365 days.   CrCl cannot be calculated (Patient's most recent lab result is older than the maximum 21 days allowed.).   Wt Readings from Last 3  Encounters:  09/19/22 157 lb 10.1 oz (71.5 kg)  08/31/22 157 lb 9.6 oz (71.5 kg)  08/11/22 158 lb 6.4 oz (71.8 kg)     Other studies reviewed: Additional studies/records reviewed today include: summarized above  ASSESSMENT AND PLAN:  Symptomatic bradycardia/pauses Tachy-brady syndrome 3.    PPM Intact function AT/AF therapies turned off given no success, no changes otherwise She has been back in AFib  since 4/2-3/24   4. Paroxysmal AFib 5. Atypical AFlutter CHA2DSVasc is 5, on Eliquis, appropriately dosed Tikosyn, QTc is OK Med list is OK Labs are UTD  We discussed rhythm and rate control strategies She would really like to try and avoid amiodarone She would be interested in d/w Dr. Lalla Brothers if he would consider her a repeat ablation candidate.  Will go ahead and continue for now Tikosyn until her d/w Dr. Lalla Brothers     6. HTN Looks great  Disposition: f/u with Dr. Lalla Brothers in the next month to discuss next step strategy     Current medicines are reviewed at length with the patient today.  The patient did not have any concerns regarding medicines.  Norma Fredrickson, PA-C 10/09/2022 10:22 AM     [facility] [address] [place] [address] [phone] (office)  [phone] (fax)

## 2022-10-10 ENCOUNTER — Encounter: Payer: Self-pay | Admitting: Physician Assistant

## 2022-10-10 ENCOUNTER — Ambulatory Visit: Payer: PPO | Attending: Physician Assistant | Admitting: Physician Assistant

## 2022-10-10 VITALS — BP 110/68 | HR 89 | Ht 67.0 in | Wt 158.4 lb

## 2022-10-10 DIAGNOSIS — I4892 Unspecified atrial flutter: Secondary | ICD-10-CM

## 2022-10-10 DIAGNOSIS — Z5181 Encounter for therapeutic drug level monitoring: Secondary | ICD-10-CM

## 2022-10-10 DIAGNOSIS — Z950 Presence of cardiac pacemaker: Secondary | ICD-10-CM | POA: Diagnosis not present

## 2022-10-10 DIAGNOSIS — Z79899 Other long term (current) drug therapy: Secondary | ICD-10-CM

## 2022-10-10 DIAGNOSIS — I480 Paroxysmal atrial fibrillation: Secondary | ICD-10-CM | POA: Diagnosis not present

## 2022-10-10 LAB — CUP PACEART INCLINIC DEVICE CHECK
Date Time Interrogation Session: 20240422131223
Implantable Lead Connection Status: 753985
Implantable Lead Connection Status: 753985
Implantable Lead Implant Date: 20231013
Implantable Lead Implant Date: 20231013
Implantable Lead Location: 753859
Implantable Lead Location: 753860
Implantable Lead Model: 3830
Implantable Lead Model: 5076
Implantable Pulse Generator Implant Date: 20231013
Lead Channel Pacing Threshold Amplitude: 0.75 V
Lead Channel Pacing Threshold Pulse Width: 0.4 ms
Lead Channel Sensing Intrinsic Amplitude: 0.6 mV
Lead Channel Sensing Intrinsic Amplitude: 4.1 mV

## 2022-10-10 NOTE — Patient Instructions (Addendum)
Medication Instructions:   Your physician recommends that you continue on your current medications as directed. Please refer to the Current Medication list given to you today.    *If you need a refill on your cardiac medications before your next appointment, please call your pharmacy*   Lab Work: NONE ORDERED  TODAY    If you have labs (blood work) drawn today and your tests are completely normal, you will receive your results only by: MyChart Message (if you have MyChart) OR A paper copy in the mail If you have any lab test that is abnormal or we need to change your treatment, we will call you to review the results.   Testing/Procedures: NONE ORDERED  TODAY    Follow-Up: At [facility], you and your health needs are our priority.  As part of our continuing mission to provide you with exceptional heart care, we have created designated Provider Care Teams.  These Care Teams include your primary Cardiologist (physician) and Advanced Practice Providers (APPs -  Physician Assistants and Nurse Practitioners) who all work together to provide you with the care you need, when you need it.  We recommend signing up for the patient portal called "MyChart".  Sign up information is provided on this After Visit Summary.  MyChart is used to connect with patients for Virtual Visits (Telemedicine).  Patients are able to view lab/test results, encounter notes, upcoming appointments, etc.  Non-urgent messages can be sent to your provider as well.   To learn more about what you can do with MyChart, go to [email or web address].    Your next appointment:    1 month(s) ( CONTACT [facility] FOR EP SCHEDULING ISSUES )   Provider:    Steffanie Dunn, MD    Other Instructions

## 2022-10-12 ENCOUNTER — Telehealth: Payer: Self-pay | Admitting: Hematology

## 2022-10-12 NOTE — Telephone Encounter (Signed)
Patient asked me to call back after lunch, followed back up with her moved appointment due to her being out of town.

## 2022-10-12 NOTE — Telephone Encounter (Signed)
Patient called to reschedule appointments.patient aware of date and times of appointment.

## 2022-10-13 ENCOUNTER — Inpatient Hospital Stay: Payer: PPO | Admitting: Hematology

## 2022-10-13 ENCOUNTER — Inpatient Hospital Stay: Payer: PPO

## 2022-10-18 DIAGNOSIS — I10 Essential (primary) hypertension: Secondary | ICD-10-CM | POA: Diagnosis not present

## 2022-10-18 DIAGNOSIS — M8580 Other specified disorders of bone density and structure, unspecified site: Secondary | ICD-10-CM | POA: Diagnosis not present

## 2022-10-18 DIAGNOSIS — E559 Vitamin D deficiency, unspecified: Secondary | ICD-10-CM | POA: Diagnosis not present

## 2022-10-18 DIAGNOSIS — N182 Chronic kidney disease, stage 2 (mild): Secondary | ICD-10-CM | POA: Diagnosis not present

## 2022-10-18 DIAGNOSIS — I4821 Permanent atrial fibrillation: Secondary | ICD-10-CM | POA: Diagnosis not present

## 2022-10-18 DIAGNOSIS — D6869 Other thrombophilia: Secondary | ICD-10-CM | POA: Diagnosis not present

## 2022-10-18 DIAGNOSIS — Z853 Personal history of malignant neoplasm of breast: Secondary | ICD-10-CM | POA: Diagnosis not present

## 2022-10-18 DIAGNOSIS — B1920 Unspecified viral hepatitis C without hepatic coma: Secondary | ICD-10-CM | POA: Diagnosis not present

## 2022-10-18 DIAGNOSIS — H6121 Impacted cerumen, right ear: Secondary | ICD-10-CM | POA: Diagnosis not present

## 2022-10-18 DIAGNOSIS — Z0000 Encounter for general adult medical examination without abnormal findings: Secondary | ICD-10-CM | POA: Diagnosis not present

## 2022-10-18 DIAGNOSIS — Z86711 Personal history of pulmonary embolism: Secondary | ICD-10-CM | POA: Diagnosis not present

## 2022-10-18 DIAGNOSIS — R490 Dysphonia: Secondary | ICD-10-CM | POA: Diagnosis not present

## 2022-10-18 DIAGNOSIS — R1310 Dysphagia, unspecified: Secondary | ICD-10-CM | POA: Diagnosis not present

## 2022-10-18 DIAGNOSIS — J309 Allergic rhinitis, unspecified: Secondary | ICD-10-CM | POA: Diagnosis not present

## 2022-10-18 DIAGNOSIS — E782 Mixed hyperlipidemia: Secondary | ICD-10-CM | POA: Diagnosis not present

## 2022-10-18 DIAGNOSIS — I5030 Unspecified diastolic (congestive) heart failure: Secondary | ICD-10-CM | POA: Diagnosis not present

## 2022-10-23 ENCOUNTER — Telehealth: Payer: Self-pay | Admitting: Cardiology

## 2022-10-23 NOTE — Telephone Encounter (Signed)
Patient called in reporting she is uncertain whether she took her medications this morning. The pills are still in her box this morning, but unsure if she took them from another day. I advised given the uncertainty, would just continue her usual medication doses this evening, which includes Eliquis and Tikosyn. She voiced understanding and thanked for call back.

## 2022-10-25 ENCOUNTER — Ambulatory Visit: Payer: PPO | Admitting: Hematology

## 2022-10-25 ENCOUNTER — Other Ambulatory Visit: Payer: PPO

## 2022-10-27 ENCOUNTER — Other Ambulatory Visit: Payer: Self-pay

## 2022-10-27 DIAGNOSIS — Z170 Estrogen receptor positive status [ER+]: Secondary | ICD-10-CM

## 2022-10-27 NOTE — Progress Notes (Signed)
[facility]   [phone] [phone]   Clinic Follow up Note   Patient Care Team: Georgann Housekeeper, MD as PCP - General (Internal Medicine) Lanier Prude, MD as PCP - Electrophysiology (Cardiology) Manus Rudd, MD as Consulting Physician (General Surgery) Malachy Mood, MD as Consulting Physician (Hematology) Dorothy Puffer, MD as Consulting Physician (Radiation Oncology)  Date of Service:  10/28/2022  CHIEF COMPLAINT: f/u of left breast cancer  CURRENT THERAPY:  Surveillance  ASSESSMENT & PLAN:  Christy [name] is a 82 y.o. post-menopausal female with   1. Malignant neoplasm of upper-outer quadrant of left breast, Stage IA, pT1c, cN0, DCIS(+), ER+/PR+/HER2-, Grade 1-2  -found on screening mammogram. Left lumpectomy on 08/12/21 showed 1.7 cm invasive and in situ ductal carcinoma. Superior margin positive, and re-excision on 08/25/21 was negative for residual cancer. -she received daily radiation under Dr. Mitzi Hansen 4/3-4/28/23. -we reviewed adjuvant endocrine therapy with aromatase inhibitor or tamoxifen. Given her age and comorbidities, she is not very interested in antiestrogen therapy. I think it's acceptable not to pursue. -Patient is clinically doing very well, no pain or other concerns.  Exam was unremarkable.  Lab reviewed.  No clinical concern for recurrence. -Will continue cancer surveillance, see her back in 1 year.  We discussed what to watch at home and call us if needed.     PLAN:  - follow up in 1 year with NP - reviewed labs - increase water intake  SUMMARY OF ONCOLOGIC HISTORY: Oncology History Overview Note   Cancer Staging  Malignant neoplasm of upper-outer quadrant of left breast in female, estrogen receptor positive (HCC) Staging form: Breast, AJCC 8th Edition - Clinical stage from 07/20/2021: Stage IA (cT1c, cN0, cM0, G1, ER+, PR+, HER2-) - Signed by Malachy Mood, MD on 07/27/2021    Malignant neoplasm of upper-outer quadrant of left  breast in female, estrogen receptor positive (HCC)  07/15/2021 Mammogram   Exam: Breast Ultrasound - L  There is an irregular mass with a spiculated margin in the left breast at 12 o'clock posterior depth 8 cm from the nipple. It measures 1.4 x 1.1 x 0.9 cm. This irregular mass is hypoechoic. This correlated with mammography findings.  Ultrasound of the left axilla is negative for lymphadenopathy. Multiple normal lymph nodes are seen.  IMPRESSION: The irregular mass in the left breast is highly suggestive of malignancy.   07/20/2021 Cancer Staging   Staging form: Breast, AJCC 8th Edition - Clinical stage from 07/20/2021: Stage IA (cT1c, cN0, cM0, G1, ER+, PR+, HER2-) - Signed by Malachy Mood, MD on 07/27/2021 Stage prefix: Initial diagnosis Histologic grading system: 3 grade system   07/20/2021 Initial Biopsy   Diagnosis Breast, left, needle core biopsy, left breast mass 12:00 8 cmfn - INVASIVE DUCTAL CARCINOMA - DUCTAL CARCINOMA IN SITU - SEE COMMENT Microscopic Comment Based on the biopsy, the carcinoma appears Nottingham grade 1-2 of 3 and measures 0.5 cm in greatest linear extent.  PROGNOSTIC INDICATORS Results: Estrogen Receptor: 100%, POSITIVE, STRONG STAINING INTENSITY Progesterone Receptor: 95%, POSITIVE, STRONG STAINING INTENSITY Proliferation Marker Ki67: 5%  FLUORESCENCE IN-SITU HYBRIDIZATION Results: GROUP 5: HER2 **NEGATIVE**   07/26/2021 Initial Diagnosis   Malignant neoplasm of upper-outer quadrant of left breast in female, estrogen receptor positive (HCC)    Genetic Testing   [facility] CancerNext-Expanded Panel was Negative. Report date is 08/08/2021.  The CancerNext-Expanded gene panel offered by [facility] and includes sequencing, rearrangement, and RNA analysis for the following 77 genes: AIP, ALK, APC, ATM, AXIN2, BAP1,  BARD1, BLM, BMPR1A, BRCA1, BRCA2, BRIP1, CDC73, CDH1, CDK4, CDKN1B, CDKN2A, CHEK2, CTNNA1, DICER1, FANCC, FH, FLCN, GALNT12, KIF1B, LZTR1, MAX, MEN1,  MET, MLH1, MSH2, MSH3, MSH6, MUTYH, NBN, NF1, NF2, NTHL1, PALB2, PHOX2B, PMS2, POT1, PRKAR1A, PTCH1, PTEN, RAD51C, RAD51D, RB1, RECQL, RET, SDHA, SDHAF2, SDHB, SDHC, SDHD, SMAD4, SMARCA4, SMARCB1, SMARCE1, STK11, SUFU, TMEM127, TP53, TSC1, TSC2, VHL and XRCC2 (sequencing and deletion/duplication); EGFR, EGLN1, HOXB13, KIT, MITF, PDGFRA, POLD1, and POLE (sequencing only); EPCAM and GREM1 (deletion/duplication only).    08/12/2021 Cancer Staging   Staging form: Breast, AJCC 8th Edition - Pathologic stage from 08/12/2021: Stage IA (pT1c, pN0, cM0, G2, ER+, PR+, HER2-) - Signed by Malachy Mood, MD on 10/15/2021 Stage prefix: Initial diagnosis Histologic grading system: 3 grade system Residual tumor (R): R0 - None   08/12/2021 Definitive Surgery   FINAL MICROSCOPIC DIAGNOSIS:   A. BREAST, LEFT, LUMPECTOMY:  - Invasive and in situ ductal carcinoma, 1.7 cm.  - Invasive carcinoma involves superior margin.  - DCIS 0.1 cm from superior margin.  - Biopsy site and biopsy clip.  - See oncology table.    08/25/2021 Pathology Results   FINAL MICROSCOPIC DIAGNOSIS:   A. BREAST, LEFT SUPERIOR MARGIN, EXCISION:  - Findings consistent with previous lumpectomy.  - No residual carcinoma.  - Final superior margin negative for carcinoma.    09/20/2021 - 10/15/2021 Radiation Therapy   Site Technique Total Dose (Gy) Dose per Fx (Gy) Completed Fx Beam Energies  Breast, Left: Breast_L 3D 42.56/42.56 2.66 16/16 6X, 10X  Breast, Left: Breast_L_Bst specialPort 8/8 2 4/4 12E        INTERVAL HISTORY:  Christy [name] is here for a follow up of breast cancer. She was last seen by me one year ago. She presents to clinic alone. She is having issues with a-fib with an increase in her Diltiazem and Eliquis.  Left breast is tender after surgery. No issues with swelling.    All other systems were reviewed with the patient and are negative.  MEDICAL HISTORY:  Past Medical History:  Diagnosis Date   Anxiety    Atrial  fibrillation (HCC)    Bilateral lower extremity edema    Chronic, likely related to venous stasis    DJD (degenerative joint disease), lumbosacral    Dyslipidemia    Dysrhythmia    A fib   Essential hypertension    "dx'd recently" (10/06/2016)   GERD (gastroesophageal reflux disease)    History of hepatitis C virus infection ~ 1997   In remission   Lung granuloma (HCC) 2004   CT scan   Mitral regurgitation    04/30/21 echo: bileaflet prolapse with moderate MR   Osteopenia    Paroxysmal atrial flutter (HCC) 04/02/2022   Paroxysmal atrial tachycardia    PE (pulmonary embolism) 01/2016   PONV (postoperative nausea and vomiting)    Patient states she had extremem nausea after neck surgery several years ago   Spondylolisthesis    Status post epidural injections 3 by neurosurgery  - Dr. Jeral Fruit    Varicose veins of both legs with edema    Vitamin D deficiency     SURGICAL HISTORY: Past Surgical History:  Procedure Laterality Date   ATRIAL FIBRILLATION ABLATION N/A 03/09/2017   Procedure: Atrial Fibrillation Ablation;  Surgeon: Hillis Range, MD;  Location: [facility] INVASIVE CV LAB;  Service: Cardiovascular;  Laterality: N/A;   BACK SURGERY     BREAST LUMPECTOMY WITH RADIOACTIVE SEED LOCALIZATION Left 08/12/2021   Procedure: LEFT BREAST LUMPECTOMY WITH RADIOACTIVE  SEED LOCALIZATION;  Surgeon: Manus Rudd, MD;  [facility];  Service: General;  Laterality: Left;   CARDIOVERSION N/A 03/21/2016   Procedure: CARDIOVERSION;  Surgeon: Lars Masson, MD;  Location: [facility] ENDOSCOPY;  Service: Cardiovascular;  Laterality: N/A;   CARDIOVERSION N/A 09/19/2022   Procedure: CARDIOVERSION;  Surgeon: Wendall Stade, MD;  Location: [facility] ENDOSCOPY;  Service: Cardiovascular;  Laterality: N/A;   COMBINED HYSTEROSCOPY DIAGNOSTIC / D&C  2005   COMBINED HYSTEROSCOPY DIAGNOSTIC / D&C  1999   DILATION AND CURETTAGE OF UTERUS     implantable loop recorder removal  11/10/2021   LOOP RECORDER INSERTION N/A  10/31/2017   Procedure: LOOP RECORDER INSERTION;  Surgeon: Hillis Range, MD;  Location: [facility] INVASIVE CV LAB;  Service: Cardiovascular;  Laterality: N/A;   NECK SURGERY  1995   "went in front and back; broken neck"   PACEMAKER IMPLANT N/A 04/01/2022   Procedure: PACEMAKER IMPLANT;  Surgeon: Lanier Prude, MD;  Location: [facility] INVASIVE CV LAB;  Service: Cardiovascular;  Laterality: N/A;   RE-EXCISION OF BREAST CANCER,SUPERIOR MARGINS Left 08/25/2021   Procedure: RE-EXCISION OF SUPERIOR MARGIN -  LEFT BREAST LUMPECTOMY;  Surgeon: Manus Rudd, MD;  Location: [facility] OR;  Service: General;  Laterality: Left;   TEE WITHOUT CARDIOVERSION N/A 03/08/2017   Procedure: TRANSESOPHAGEAL ECHOCARDIOGRAM (TEE);  Surgeon: Wendall Stade, MD;  Location: [facility] ENDOSCOPY;  Service: Cardiovascular;  Laterality: N/A;   TOE SURGERY Right    replaced joint   TONSILLECTOMY     TUBAL LIGATION Bilateral 1976   VAGINAL DELIVERY     x2    I have reviewed the social history and family history with the patient and they are unchanged from previous note.  ALLERGIES:  is allergic to caffeine and epinephrine.  MEDICATIONS:  Current Outpatient Medications  Medication Sig Dispense Refill   acetaminophen (TYLENOL) 325 MG tablet Take 2 tablets (650 mg total) by mouth every 4 (four) hours as needed for headache or mild pain.     apixaban (ELIQUIS) 5 MG TABS tablet Take 1 tablet (5 mg total) by mouth 2 (two) times daily.     betamethasone valerate (VALISONE) 0.1 % cream Apply 1 application topically 2 (two) times daily as needed (itching.).     Cholecalciferol (VITAMIN D) 125 MCG (5000 UT) CAPS Take 5,000 Units by mouth in the morning.     diltiazem (CARDIZEM CD) 120 MG 24 hr capsule Take 1 capsule (120 mg total) by mouth at bedtime. 30 capsule 3   diltiazem (CARDIZEM) 30 MG tablet Take 1 tablet every 4 hours AS NEEDED for AFIB heart rate >100 as long as top BP >100. 30 tablet 1   dofetilide (TIKOSYN) 250 MCG capsule TAKE 1  CAPSULE BY MOUTH TWICE DAILY IN THE MORNING AND DINNER FOR HEART 180 capsule 1   furosemide (LASIX) 20 MG tablet Take 1 tablet (20 mg total) by mouth daily as needed for fluid or edema (weight gain of 3 lbs in 24 hrs or 5 lbs in 1 week). 30 tablet 3   levocetirizine (XYZAL) 5 MG tablet Take 5 mg by mouth at bedtime.     losartan (COZAAR) 25 MG tablet Take 25 mg by mouth in the morning.     Magnesium 250 MG TABS Take 250 mg by mouth at bedtime.     pantoprazole (PROTONIX) 40 MG tablet Take 40 mg by mouth 2 (two) times daily.     potassium chloride (KLOR-CON) 10 MEQ tablet TAKE 1 TABLET(10 MEQ) BY  MOUTH DAILY 30 tablet 6   Probiotic Product (PROBIOTIC DAILY PO) Take 1 tablet by mouth every morning. May take 2 tablets as needed     propranolol (INDERAL) 20 MG tablet Take 1 tablet (20 mg total) by mouth 2 (two) times daily. 180 tablet 3   No current facility-administered medications for this visit.    PHYSICAL EXAMINATION: ECOG PERFORMANCE STATUS: 0 - Asymptomatic  Vitals:   10/28/22 0942  BP: 128/81  Pulse: 85  Resp: 18  Temp: (!) 97.5 F (36.4 C)  SpO2: 98%    Wt Readings from Last 3 Encounters:  10/28/22 160 lb 1.6 oz (72.6 kg)  10/10/22 158 lb 6.4 oz (71.8 kg)  09/19/22 157 lb 10.1 oz (71.5 kg)     GENERAL:alert, no distress and comfortable SKIN: skin color normal, no rashes or significant lesions EYES: normal, Conjunctiva are pink and non-injected, sclera clear  NEURO: alert & oriented x 3 with fluent speech  LABORATORY DATA:  I have reviewed the data as listed    Latest Ref Rng & Units 10/28/2022    9:20 AM 09/16/2022   10:19 AM 03/31/2022   10:22 AM  CBC  WBC 4.0 - 10.5 K/uL 5.4  4.8  4.6   Hemoglobin 12.0 - 15.0 g/dL 16.1  09.6  04.5   Hematocrit 36.0 - 46.0 % 36.9  38.2  39.2   Platelets 150 - 400 K/uL 222  194  208         Latest Ref Rng & Units 10/28/2022    9:20 AM 09/16/2022   10:19 AM 07/13/2022    3:24 PM  CMP  Glucose 70 - 99 mg/dL 89  74  97   BUN 8  - 23 mg/dL 29  30  30    Creatinine 0.44 - 1.00 mg/dL 4.09  8.11  9.14   Sodium 135 - 145 mmol/L 140  141  141   Potassium 3.5 - 5.1 mmol/L 5.0  4.4  4.1   Chloride 98 - 111 mmol/L 106  106  101   CO2 22 - 32 mmol/L 29  26  27    Calcium 8.9 - 10.3 mg/dL 9.5  9.4  9.6   Total Protein 6.5 - 8.1 g/dL 7.3     Total Bilirubin 0.3 - 1.2 mg/dL 0.7     Alkaline Phos 38 - 126 U/L 66     AST 15 - 41 U/L 21     ALT 0 - 44 U/L 13         RADIOGRAPHIC STUDIES: I have personally reviewed the radiological images as listed and agreed with the findings in the report. No results found.    No orders of the defined types were placed in this encounter.  All questions were answered. The patient knows to call the clinic with any problems, questions or concerns. No barriers to learning was detected. The total time spent in the appointment was 25 minutes.     Malachy Mood, MD 10/28/2022

## 2022-10-28 ENCOUNTER — Inpatient Hospital Stay: Payer: PPO | Attending: Hematology

## 2022-10-28 ENCOUNTER — Other Ambulatory Visit: Payer: Self-pay

## 2022-10-28 ENCOUNTER — Inpatient Hospital Stay (HOSPITAL_BASED_OUTPATIENT_CLINIC_OR_DEPARTMENT_OTHER): Payer: PPO | Admitting: Hematology

## 2022-10-28 ENCOUNTER — Encounter: Payer: Self-pay | Admitting: Hematology

## 2022-10-28 VITALS — BP 128/81 | HR 85 | Temp 97.5°F | Resp 18 | Ht 67.0 in | Wt 160.1 lb

## 2022-10-28 DIAGNOSIS — Z170 Estrogen receptor positive status [ER+]: Secondary | ICD-10-CM

## 2022-10-28 DIAGNOSIS — C50412 Malignant neoplasm of upper-outer quadrant of left female breast: Secondary | ICD-10-CM

## 2022-10-28 LAB — CMP (CANCER CENTER ONLY)
ALT: 13 U/L (ref 0–44)
AST: 21 U/L (ref 15–41)
Albumin: 4.6 g/dL (ref 3.5–5.0)
Alkaline Phosphatase: 66 U/L (ref 38–126)
Anion gap: 5 (ref 5–15)
BUN: 29 mg/dL — ABNORMAL HIGH (ref 8–23)
CO2: 29 mmol/L (ref 22–32)
Calcium: 9.5 mg/dL (ref 8.9–10.3)
Chloride: 106 mmol/L (ref 98–111)
Creatinine: 1.04 mg/dL — ABNORMAL HIGH (ref 0.44–1.00)
GFR, Estimated: 53 mL/min — ABNORMAL LOW (ref >60)
Glucose, Bld: 89 mg/dL (ref 70–99)
Potassium: 5 mmol/L (ref 3.5–5.1)
Sodium: 140 mmol/L (ref 135–145)
Total Bilirubin: 0.7 mg/dL (ref 0.3–1.2)
Total Protein: 7.3 g/dL (ref 6.5–8.1)

## 2022-10-28 LAB — CBC WITH DIFFERENTIAL (CANCER CENTER ONLY)
Abs Immature Granulocytes: 0.01 10*3/uL (ref 0.00–0.07)
Basophils Absolute: 0 10*3/uL (ref 0.0–0.1)
Basophils Relative: 0 %
Eosinophils Absolute: 0.2 10*3/uL (ref 0.0–0.5)
Eosinophils Relative: 3 %
HCT: 36.9 % (ref 36.0–46.0)
Hemoglobin: 12 g/dL (ref 12.0–15.0)
Immature Granulocytes: 0 %
Lymphocytes Relative: 16 %
Lymphs Abs: 0.9 10*3/uL (ref 0.7–4.0)
MCH: 32 pg (ref 26.0–34.0)
MCHC: 32.5 g/dL (ref 30.0–36.0)
MCV: 98.4 fL (ref 80.0–100.0)
Monocytes Absolute: 0.8 10*3/uL (ref 0.1–1.0)
Monocytes Relative: 15 %
Neutro Abs: 3.6 10*3/uL (ref 1.7–7.7)
Neutrophils Relative %: 66 %
Platelet Count: 222 10*3/uL (ref 150–400)
RBC: 3.75 MIL/uL — ABNORMAL LOW (ref 3.87–5.11)
RDW: 13.1 % (ref 11.5–15.5)
WBC Count: 5.4 10*3/uL (ref 4.0–10.5)
nRBC: 0 % (ref 0.0–0.2)

## 2022-11-03 DIAGNOSIS — R1310 Dysphagia, unspecified: Secondary | ICD-10-CM | POA: Diagnosis not present

## 2022-11-03 DIAGNOSIS — R490 Dysphonia: Secondary | ICD-10-CM | POA: Diagnosis not present

## 2022-11-03 DIAGNOSIS — K219 Gastro-esophageal reflux disease without esophagitis: Secondary | ICD-10-CM | POA: Diagnosis not present

## 2022-11-04 ENCOUNTER — Other Ambulatory Visit: Payer: Self-pay | Admitting: Otolaryngology

## 2022-11-04 DIAGNOSIS — R1310 Dysphagia, unspecified: Secondary | ICD-10-CM

## 2022-11-07 ENCOUNTER — Other Ambulatory Visit (HOSPITAL_COMMUNITY): Payer: Self-pay | Admitting: *Deleted

## 2022-11-07 MED ORDER — DILTIAZEM HCL ER COATED BEADS 120 MG PO CP24: 120.0000 mg | ORAL_CAPSULE | Freq: Every day | ORAL | 6 refills | Status: DC

## 2022-11-07 NOTE — Progress Notes (Signed)
Remote pacemaker transmission.   

## 2022-11-14 NOTE — Progress Notes (Unsigned)
Electrophysiology Office Follow up Visit Note:    Date:  11/15/2022   ID:  Christy [name], DOB 1941/05/25, MRN [id number]  PCP:  Georgann Housekeeper, MD  [facility] Cardiologist:  None  [facility] Electrophysiologist:  Lanier Prude, MD    Interval History:    Christy [name] is a 82 y.o. female who presents for a follow up visit.   Last seen October 10, 2022 by Luster Landsberg.  She has a history of atrial fibrillation post ablation in September 2018.  During that ablation, the pulmonary veins were isolated.  An ectopic atrial rhythm was also ablated originating on the roof of the right atrium.  Multiple other atypical atrial flutter circuits were noted during the procedure but these were not targeted with catheter ablation.  Her A-fib was diagnosed in 2017.  She was previously on flecainide and Rythmol before starting Tikosyn.  At her last appointment with Renee, amiodarone and redo catheter ablation were discussed.  She presents today to discuss repeat catheter ablation.      Past medical, surgical, social and family history were reviewed.  ROS:   Please see the history of present illness.    All other systems reviewed and are negative.  EKGs/Labs/Other Studies Reviewed:    The following studies were reviewed today:   Nov 15, 2022 in clinic device interrogation personally reviewed Longevity 13.4 years Lead parameters stable Persistent AF since mid February AP 1.9% VP 47%   EKG:  The ekg ordered today demonstrates AF/AFL with v rate 90 bpm. QRS duration 84ms. QTc today.   Physical Exam:    VS:  BP 120/70   Pulse 90   Ht 5\' 7"  (1.702 m)   Wt 160 lb (72.6 kg)   SpO2 98%   BMI 25.06 kg/m     Wt Readings from Last 3 Encounters:  11/15/22 160 lb (72.6 kg)  10/28/22 160 lb 1.6 oz (72.6 kg)  10/10/22 158 lb 6.4 oz (71.8 kg)     GEN:  Well nourished, well developed in no acute distress CARDIAC: irregularly irregular, no murmurs, rubs, gallops. Ppm site healed  well.  RESPIRATORY:  Clear to auscultation without rales, wheezing or rhonchi       ASSESSMENT:    1. Paroxysmal atrial fibrillation (HCC)   2. Paroxysmal atrial flutter (HCC)   3. Encounter for long-term (current) use of high-risk medication   4. Cardiac pacemaker in situ    PLAN:    In order of problems listed above:   #Persistent atrial fibrillation and flutter Recurrent and symptomatic.  Previously ablated in 2018.  Previously on flecainide, dronedarone and now dofetilide.  Discussed treatment options today for AF including antiarrhythmic drug therapy and ablation. Discussed risks, recovery and likelihood of success with each treatment strategy. Risk, benefits, and alternatives to EP study and radiofrequency ablation for afib were discussed. These risks include but are not limited to stroke, bleeding, vascular damage, tamponade, perforation, damage to the esophagus, lungs, and other structures, pulmonary vein stenosis, worsening renal function, and death.  Discussed potential need for repeat ablation procedures and antiarrhythmic drugs after an initial ablation. The patient understands these risk and wishes to proceed.  We will therefore proceed with catheter ablation at the next available time.  Carto, ICE, anesthesia are requested for the procedure.  Will also obtain CT PV protocol prior to the procedure to exclude LAA thrombus and further evaluate atrial anatomy.  Ablation strategy would be pulmonary vein touchup plus posterior wall with possible  atypical flutter and typical flutter ablations.  Continue Tikosyn.  QTc stable for continued use on today's EKG.    #Permanent pacemaker in situ Device functioning appropriately.  Continue remote monitoring.    Signed, Steffanie Dunn, MD, [facility], [facility] 11/15/2022 8:32 PM    Electrophysiology [facility]

## 2022-11-15 ENCOUNTER — Ambulatory Visit: Payer: PPO | Attending: Cardiology | Admitting: Cardiology

## 2022-11-15 ENCOUNTER — Encounter: Payer: Self-pay | Admitting: Cardiology

## 2022-11-15 VITALS — BP 120/70 | HR 90 | Ht 67.0 in | Wt 160.0 lb

## 2022-11-15 DIAGNOSIS — I480 Paroxysmal atrial fibrillation: Secondary | ICD-10-CM | POA: Diagnosis not present

## 2022-11-15 DIAGNOSIS — I4892 Unspecified atrial flutter: Secondary | ICD-10-CM | POA: Diagnosis not present

## 2022-11-15 DIAGNOSIS — Z79899 Other long term (current) drug therapy: Secondary | ICD-10-CM

## 2022-11-15 DIAGNOSIS — Z950 Presence of cardiac pacemaker: Secondary | ICD-10-CM

## 2022-11-15 NOTE — Patient Instructions (Addendum)
Medication Instructions:  Your physician recommends that you continue on your current medications as directed. Please refer to the Current Medication list given to you today.  *If you need a refill on your cardiac medications before your next appointment, please call your pharmacy*  Lab Work: BMET and CBC prior to CT scan and ablation  Testing/Procedures: Your physician has requested that you have cardiac CT. Cardiac computed tomography (CT) is a painless test that uses an x-ray machine to take clear, detailed pictures of your heart. For further information please visit [email or web address] Please follow instruction sheet as given. We will call you to schedule your CT scan. This will be done about one week prior to your ablation.    Follow-Up: At [facility], you and your health needs are our priority.  As part of our continuing mission to provide you with exceptional heart care, we have created designated Provider Care Teams.  These Care Teams include your primary Cardiologist (physician) and Advanced Practice Providers (APPs -  Physician Assistants and Nurse Practitioners) who all work together to provide you with the care you need, when you need it. You are scheduled for Atrial Fibrillation Ablation on Thursday, August 22 with Dr. Steffanie Dunn.Please arrive at the Main Entrance A at [facility]: [address] [place], [place] [address] at 8:30 AM    Your next appointment:   We will call you to arrange your follow up appointments.

## 2022-11-16 ENCOUNTER — Ambulatory Visit: Payer: PPO | Admitting: Cardiology

## 2022-11-21 ENCOUNTER — Ambulatory Visit
Admission: RE | Admit: 2022-11-21 | Discharge: 2022-11-21 | Disposition: A | Discharge status: Home / Self Care | Payer: PPO | Source: Ambulatory Visit | Attending: Otolaryngology | Admitting: Otolaryngology

## 2022-11-21 DIAGNOSIS — R1310 Dysphagia, unspecified: Secondary | ICD-10-CM | POA: Diagnosis not present

## 2023-01-03 ENCOUNTER — Ambulatory Visit (INDEPENDENT_AMBULATORY_CARE_PROVIDER_SITE_OTHER): Payer: PPO

## 2023-01-03 ENCOUNTER — Telehealth: Payer: Self-pay | Admitting: Cardiology

## 2023-01-03 DIAGNOSIS — R55 Syncope and collapse: Secondary | ICD-10-CM

## 2023-01-03 NOTE — Telephone Encounter (Signed)
Pt is requesting a callback regarding her upcoming Ablation. She stated she made the decision to have it done without her husband or daughter and they'd like to discuss further. Please advisee

## 2023-01-03 NOTE — Telephone Encounter (Signed)
Spoke with the patient in regards to her ablation with Dr. Lalla Brothers. She states that she is questioning whether she should have the procedure done. She had an ablation previously that did not last and she does not want to go through it again if it does not work. She states that her family was not present with her at her appointment when the procedure was scheduled and they also have a couple questions. I have attempted to answer the patient's questions to the best of my ability and have offered the patient a virtual visit with Dr. Lalla Brothers to discuss any further questions. She states for now she will keep her procedure as planned and if she changes her mind or decides she wants to talk with Dr. Lalla Brothers again she will let us know.

## 2023-01-04 LAB — CUP PACEART REMOTE DEVICE CHECK
Battery Remaining Longevity: 161 mo
Battery Voltage: 3.08 V
Brady Statistic RA Percent Paced: 1.78 %
Brady Statistic RV Percent Paced: 46.41 %
Date Time Interrogation Session: 20240716081732
Implantable Lead Connection Status: 753985
Implantable Lead Connection Status: 753985
Implantable Lead Implant Date: 20231013
Implantable Lead Implant Date: 20231013
Implantable Lead Location: 753859
Implantable Lead Location: 753860
Implantable Lead Model: 3830
Implantable Lead Model: 5076
Implantable Pulse Generator Implant Date: 20231013
Lead Channel Impedance Value: 304 Ohm
Lead Channel Impedance Value: 361 Ohm
Lead Channel Impedance Value: 456 Ohm
Lead Channel Impedance Value: 627 Ohm
Lead Channel Pacing Threshold Amplitude: 0.5 V
Lead Channel Pacing Threshold Amplitude: 0.875 V
Lead Channel Pacing Threshold Pulse Width: 0.4 ms
Lead Channel Pacing Threshold Pulse Width: 0.4 ms
Lead Channel Sensing Intrinsic Amplitude: 0.25 mV
Lead Channel Sensing Intrinsic Amplitude: 0.25 mV
Lead Channel Sensing Intrinsic Amplitude: 3.25 mV
Lead Channel Sensing Intrinsic Amplitude: 3.25 mV
Lead Channel Setting Pacing Amplitude: 1.5 V
Lead Channel Setting Pacing Amplitude: 2 V
Lead Channel Setting Pacing Pulse Width: 0.4 ms
Lead Channel Setting Sensing Sensitivity: 1.2 mV
Zone Setting Status: 755011

## 2023-01-19 NOTE — Progress Notes (Signed)
Remote pacemaker transmission.   

## 2023-01-26 ENCOUNTER — Ambulatory Visit: Payer: PPO | Attending: Cardiology

## 2023-01-26 DIAGNOSIS — Z950 Presence of cardiac pacemaker: Secondary | ICD-10-CM

## 2023-01-26 DIAGNOSIS — Z79899 Other long term (current) drug therapy: Secondary | ICD-10-CM | POA: Diagnosis not present

## 2023-01-26 DIAGNOSIS — I480 Paroxysmal atrial fibrillation: Secondary | ICD-10-CM | POA: Diagnosis not present

## 2023-01-26 DIAGNOSIS — I4892 Unspecified atrial flutter: Secondary | ICD-10-CM | POA: Diagnosis not present

## 2023-01-26 LAB — CBC WITH DIFFERENTIAL/PLATELET

## 2023-01-26 LAB — BASIC METABOLIC PANEL
BUN/Creatinine Ratio: 18 (ref 12–28)
BUN: 18 mg/dL (ref 8–27)
CO2: 25 mmol/L (ref 20–29)
Calcium: 9.8 mg/dL (ref 8.7–10.3)
Chloride: 102 mmol/L (ref 96–106)
Creatinine, Ser: 1.02 mg/dL — ABNORMAL HIGH (ref 0.57–1.00)
Glucose: 122 mg/dL — ABNORMAL HIGH (ref 70–99)
Potassium: 5.2 mmol/L (ref 3.5–5.2)
Sodium: 140 mmol/L (ref 134–144)
eGFR: 54 mL/min/{1.73_m2} — ABNORMAL LOW (ref >59)

## 2023-02-02 ENCOUNTER — Ambulatory Visit (HOSPITAL_COMMUNITY)
Admission: RE | Admit: 2023-02-02 | Discharge: 2023-02-02 | Disposition: A | Discharge status: Home / Self Care | Payer: PPO | Source: Ambulatory Visit | Attending: Cardiology | Admitting: Cardiology

## 2023-02-02 DIAGNOSIS — I4892 Unspecified atrial flutter: Secondary | ICD-10-CM | POA: Insufficient documentation

## 2023-02-02 DIAGNOSIS — Z950 Presence of cardiac pacemaker: Secondary | ICD-10-CM | POA: Diagnosis not present

## 2023-02-02 DIAGNOSIS — I480 Paroxysmal atrial fibrillation: Secondary | ICD-10-CM | POA: Insufficient documentation

## 2023-02-02 DIAGNOSIS — Z79899 Other long term (current) drug therapy: Secondary | ICD-10-CM | POA: Diagnosis not present

## 2023-02-02 MED ORDER — IOHEXOL 350 MG/ML SOLN
95.0000 mL | Freq: Once | INTRAVENOUS | Status: AC | PRN
  Administered 2023-02-02: 95 mL via INTRAVENOUS

## 2023-02-03 ENCOUNTER — Other Ambulatory Visit: Payer: Self-pay

## 2023-02-03 MED ORDER — DOFETILIDE 250 MCG PO CAPS: ORAL_CAPSULE | ORAL | 2 refills | Status: DC

## 2023-02-07 ENCOUNTER — Telehealth: Payer: Self-pay | Admitting: Cardiology

## 2023-02-07 NOTE — Telephone Encounter (Signed)
Patient has questions in regards to upcoming procedure and nail polish. She states she is at the nail salon now and needs to know if she can keep polish on toes. Please advise.

## 2023-02-07 NOTE — Telephone Encounter (Signed)
Pt aware that she can have toe nail polish but nothing on her fingers.

## 2023-02-08 ENCOUNTER — Other Ambulatory Visit (HOSPITAL_COMMUNITY): Payer: Self-pay | Admitting: *Deleted

## 2023-02-08 MED ORDER — POTASSIUM CHLORIDE ER 10 MEQ PO TBCR: 10.0000 meq | EXTENDED_RELEASE_TABLET | Freq: Every day | ORAL | 6 refills | Status: DC

## 2023-02-08 NOTE — Pre-Procedure Instructions (Signed)
Instructed patient on the following items: Arrival time 08:15 Nothing to eat or drink after midnight No meds AM of procedure Responsible person to drive you home and stay with you for 24 hrs  Have you missed any doses of anti-coagulant Eliquis- takes twice a day, hasn't missed any doses.  Don't take dose in the morning.

## 2023-02-09 ENCOUNTER — Ambulatory Visit (HOSPITAL_COMMUNITY): Payer: PPO | Admitting: Anesthesiology

## 2023-02-09 ENCOUNTER — Ambulatory Visit (HOSPITAL_COMMUNITY)
Admission: RE | Admit: 2023-02-09 | Discharge: 2023-02-09 | Disposition: A | Discharge status: Home / Self Care | Payer: PPO | Attending: Cardiology | Admitting: Cardiology

## 2023-02-09 ENCOUNTER — Other Ambulatory Visit (HOSPITAL_COMMUNITY): Payer: Self-pay

## 2023-02-09 ENCOUNTER — Ambulatory Visit (HOSPITAL_COMMUNITY)
Admission: RE | Disposition: A | Discharge status: Home / Self Care | Payer: Self-pay | Source: Home / Self Care | Attending: Cardiology

## 2023-02-09 DIAGNOSIS — I484 Atypical atrial flutter: Secondary | ICD-10-CM | POA: Insufficient documentation

## 2023-02-09 DIAGNOSIS — I4819 Other persistent atrial fibrillation: Secondary | ICD-10-CM | POA: Diagnosis not present

## 2023-02-09 DIAGNOSIS — Z950 Presence of cardiac pacemaker: Secondary | ICD-10-CM | POA: Diagnosis not present

## 2023-02-09 DIAGNOSIS — I4811 Longstanding persistent atrial fibrillation: Secondary | ICD-10-CM | POA: Diagnosis not present

## 2023-02-09 DIAGNOSIS — I340 Nonrheumatic mitral (valve) insufficiency: Secondary | ICD-10-CM | POA: Diagnosis not present

## 2023-02-09 HISTORY — PX: ATRIAL FIBRILLATION ABLATION: EP1191

## 2023-02-09 SURGERY — ATRIAL FIBRILLATION ABLATION
Anesthesia: General

## 2023-02-09 MED ORDER — DOFETILIDE 250 MCG PO CAPS
250.0000 ug | ORAL_CAPSULE | Freq: Two times a day (BID) | ORAL | Status: DC
  Administered 2023-02-09: 250 ug via ORAL
  Filled 2023-02-09: qty 1

## 2023-02-09 MED ORDER — SODIUM CHLORIDE 0.9% FLUSH: 3.0000 mL | INTRAVENOUS | Status: DC | PRN

## 2023-02-09 MED ORDER — CEFAZOLIN SODIUM-DEXTROSE 2-3 GM-%(50ML) IV SOLR
INTRAVENOUS | Status: DC | PRN
  Administered 2023-02-09: 2 g via INTRAVENOUS

## 2023-02-09 MED ORDER — SUGAMMADEX SODIUM 200 MG/2ML IV SOLN
INTRAVENOUS | Status: DC | PRN
  Administered 2023-02-09: 150 mg via INTRAVENOUS

## 2023-02-09 MED ORDER — HEPARIN (PORCINE) IN NACL 1000-0.9 UT/500ML-% IV SOLN
INTRAVENOUS | Status: DC | PRN
  Administered 2023-02-09 (×4): 500 mL

## 2023-02-09 MED ORDER — PHENYLEPHRINE 80 MCG/ML (10ML) SYRINGE FOR IV PUSH (FOR BLOOD PRESSURE SUPPORT)
PREFILLED_SYRINGE | INTRAVENOUS | Status: DC | PRN
  Administered 2023-02-09 (×2): 40 ug via INTRAVENOUS

## 2023-02-09 MED ORDER — PANTOPRAZOLE SODIUM 40 MG PO TBEC
40.0000 mg | DELAYED_RELEASE_TABLET | Freq: Two times a day (BID) | ORAL | Status: DC
  Administered 2023-02-09: 40 mg via ORAL

## 2023-02-09 MED ORDER — FENTANYL CITRATE (PF) 250 MCG/5ML IJ SOLN
INTRAMUSCULAR | Status: DC | PRN
  Administered 2023-02-09: 75 ug via INTRAVENOUS

## 2023-02-09 MED ORDER — DEXAMETHASONE SODIUM PHOSPHATE 10 MG/ML IJ SOLN
INTRAMUSCULAR | Status: DC | PRN
  Administered 2023-02-09: 10 mg via INTRAVENOUS

## 2023-02-09 MED ORDER — ONDANSETRON HCL 4 MG/2ML IJ SOLN
INTRAMUSCULAR | Status: DC | PRN
  Administered 2023-02-09: 4 mg via INTRAVENOUS

## 2023-02-09 MED ORDER — FENTANYL CITRATE (PF) 100 MCG/2ML IJ SOLN
INTRAMUSCULAR | Status: AC
  Filled 2023-02-09: qty 2

## 2023-02-09 MED ORDER — COLCHICINE 0.6 MG PO TABS
0.6000 mg | ORAL_TABLET | Freq: Two times a day (BID) | ORAL | 0 refills | Status: DC
  Filled 2023-02-09: qty 10, 5d supply, fill #0

## 2023-02-09 MED ORDER — COLCHICINE 0.6 MG PO TABS
0.6000 mg | ORAL_TABLET | Freq: Two times a day (BID) | ORAL | Status: DC
  Administered 2023-02-09: 0.6 mg via ORAL

## 2023-02-09 MED ORDER — PROPOFOL 10 MG/ML IV BOLUS
INTRAVENOUS | Status: DC | PRN
  Administered 2023-02-09: 130 mg via INTRAVENOUS
  Administered 2023-02-09: 60 mg via INTRAVENOUS

## 2023-02-09 MED ORDER — ACETAMINOPHEN 325 MG PO TABS: 650.0000 mg | ORAL_TABLET | ORAL | Status: DC | PRN

## 2023-02-09 MED ORDER — ROCURONIUM BROMIDE 10 MG/ML (PF) SYRINGE
PREFILLED_SYRINGE | INTRAVENOUS | Status: DC | PRN
  Administered 2023-02-09: 50 mg via INTRAVENOUS

## 2023-02-09 MED ORDER — CEFAZOLIN SODIUM-DEXTROSE 2-4 GM/100ML-% IV SOLN
INTRAVENOUS | Status: AC
  Filled 2023-02-09: qty 100

## 2023-02-09 MED ORDER — SODIUM CHLORIDE 0.9 % IV SOLN: 250.0000 mL | INTRAVENOUS | Status: DC | PRN

## 2023-02-09 MED ORDER — LIDOCAINE 2% (20 MG/ML) 5 ML SYRINGE
INTRAMUSCULAR | Status: DC | PRN
  Administered 2023-02-09: 60 mg via INTRAVENOUS

## 2023-02-09 MED ORDER — SODIUM CHLORIDE 0.9 % IV SOLN: INTRAVENOUS | Status: DC

## 2023-02-09 MED ORDER — SODIUM CHLORIDE 0.9% FLUSH: 3.0000 mL | Freq: Two times a day (BID) | INTRAVENOUS | Status: DC

## 2023-02-09 MED ORDER — ONDANSETRON HCL 4 MG/2ML IJ SOLN: 4.0000 mg | Freq: Four times a day (QID) | INTRAMUSCULAR | Status: DC | PRN

## 2023-02-09 MED ORDER — HEPARIN SODIUM (PORCINE) 1000 UNIT/ML IJ SOLN
INTRAMUSCULAR | Status: DC | PRN
  Administered 2023-02-09: 11000 [IU] via INTRAVENOUS

## 2023-02-09 MED ORDER — APIXABAN 5 MG PO TABS
5.0000 mg | ORAL_TABLET | Freq: Two times a day (BID) | ORAL | Status: DC
  Administered 2023-02-09: 5 mg via ORAL

## 2023-02-09 SURGICAL SUPPLY — 19 items
BAG SNAP BAND KOVER 36X36 (MISCELLANEOUS) IMPLANT
BLANKET WARM UNDERBOD FULL ACC (MISCELLANEOUS) ×1 IMPLANT
CABLE PFA RX CATH CONN (CABLE) IMPLANT
CATH FARAWAVE ABLATION 31 (CATHETERS) IMPLANT
CATH OCTARAY 2.0 F 3-3-3-3-3 (CATHETERS) IMPLANT
CATH SOUNDSTAR ECO 8FR (CATHETERS) IMPLANT
CATH WEBSTER BI DIR CS D-F CRV (CATHETERS) IMPLANT
CLOSURE PERCLOSE PROSTYLE (VASCULAR PRODUCTS) IMPLANT
COVER SWIFTLINK CONNECTOR (BAG) ×1 IMPLANT
DILATOR VESSEL 38 20CM 11FR (INTRODUCER) IMPLANT
GUIDEWIRE INQWIRE 1.5J.035X260 (WIRE) IMPLANT
INQWIRE 1.5J .035X260CM (WIRE) ×2 IMPLANT
PACK EP LATEX FREE (CUSTOM PROCEDURE TRAY) ×1
PACK EP LF (CUSTOM PROCEDURE TRAY) ×1 IMPLANT
PAD DEFIB RADIO PHYSIO CONN (PAD) ×1 IMPLANT
SHEATH PINNACLE 8F 10CM (SHEATH) IMPLANT
SHEATH PINNACLE 9F 10CM (SHEATH) IMPLANT
SHEATH PROBE COVER 6X72 (BAG) IMPLANT
SHEATH WIRE KIT BAYLIS SL1 (KITS) IMPLANT

## 2023-02-09 NOTE — Progress Notes (Signed)
Patient and wife was given discharge instructions. Both verbalized understanding. 

## 2023-02-09 NOTE — Progress Notes (Signed)
Dr Lalla Brothers notified that pt is in A-fib with some v-pacing, states he is aware, no new orders at this time, safety maintained

## 2023-02-09 NOTE — Discharge Instructions (Signed)

## 2023-02-09 NOTE — Anesthesia Postprocedure Evaluation (Signed)
Anesthesia Post Note  Patient: Christy [name]  Procedure(s) Performed: ATRIAL FIBRILLATION ABLATION     Patient location during evaluation: Cath Lab Anesthesia Type: General Level of consciousness: awake and alert Pain management: pain level controlled Vital Signs Assessment: post-procedure vital signs reviewed and stable Respiratory status: spontaneous breathing, nonlabored ventilation, respiratory function stable and patient connected to nasal cannula oxygen Cardiovascular status: blood pressure returned to baseline and stable Postop Assessment: no apparent nausea or vomiting Anesthetic complications: no   No notable events documented.  Last Vitals:  Vitals:   02/09/23 1330 02/09/23 1400  BP: 130/77 123/81  Pulse: 70 91  Resp: 10 14  Temp:    SpO2: 91% 91%    Last Pain:  Vitals:   02/09/23 1315  TempSrc: Temporal  PainSc: 0-No pain                 Armine Rizzolo S

## 2023-02-09 NOTE — Transfer of Care (Signed)
Immediate Anesthesia Transfer of Care Note  Patient: Christy [name]  Procedure(s) Performed: ATRIAL FIBRILLATION ABLATION  Patient Location: Cath Lab  Anesthesia Type:General  Level of Consciousness: awake, drowsy, and patient cooperative  Airway & Oxygen Therapy: Patient Spontanous Breathing and Patient connected to nasal cannula oxygen  Post-op Assessment: Report given to RN and Post -op Vital signs reviewed and stable  Post vital signs: Reviewed and stable  Last Vitals:  Vitals Value Taken Time  BP 134/89 02/09/23 1242  Temp    Pulse 68 02/09/23 1243  Resp 12 02/09/23 1243  SpO2 95 % 02/09/23 1243  Vitals shown include unfiled device data.  Last Pain:  Vitals:   02/09/23 0848  TempSrc:   PainSc: 0-No pain         Complications: No notable events documented.

## 2023-02-09 NOTE — Progress Notes (Signed)
Patient walked to the bathroom no difficulties. Groin sites, level 0, clean dry and intact.

## 2023-02-09 NOTE — H&P (Signed)
Electrophysiology Office Follow up Visit Note:     Date:  02/09/2023    ID:  Christy [name], DOB 08/26/1940, MRN [id number]   PCP:  Georgann Housekeeper, MD       [facility] Cardiologist:  None  [facility] Electrophysiologist:  Lanier Prude, MD      Interval History:     Christy [name] is a 82 y.o. female who presents for a follow up visit.    Last seen October 10, 2022 by Luster Landsberg.  She has a history of atrial fibrillation post ablation in September 2018.  During that ablation, the pulmonary veins were isolated.  An ectopic atrial rhythm was also ablated originating on the roof of the right atrium.  Multiple other atypical atrial flutter circuits were noted during the procedure but these were not targeted with catheter ablation.   Her A-fib was diagnosed in 2017.  She was previously on flecainide and Rythmol before starting Tikosyn.  At her last appointment with Renee, amiodarone and redo catheter ablation were discussed.  She presents today to discuss repeat catheter ablation.    Presents for PVI today.   Objective Past medical, surgical, social and family history were reviewed.   ROS:   Please see the history of present illness.    All other systems reviewed and are negative.   EKGs/Labs/Other Studies Reviewed:     The following studies were reviewed today:     Nov 17, 2022 in clinic device interrogation personally reviewed Longevity 13.4 years Lead parameters stable Persistent AF since mid February AP 1.9% VP 47%     EKG:  The ekg ordered today demonstrates AF/AFL with v rate 90 bpm. QRS duration 84ms. QTc today.     Physical Exam:     VS:  BP 121/87   Pulse 89   Ht 5\' 7"  (1.702 m)   Wt 160 lb (72.6 kg)   SpO2 98%   BMI 25.06 kg/m         Wt Readings from Last 3 Encounters:  11-17-2022 160 lb (72.6 kg)  10/28/22 160 lb 1.6 oz (72.6 kg)  10/10/22 158 lb 6.4 oz (71.8 kg)      GEN:  Well nourished, well developed in no acute distress CARDIAC:  irregularly irregular, no murmurs, rubs, gallops. Ppm site healed well.  RESPIRATORY:  Clear to auscultation without rales, wheezing or rhonchi          Assessment ASSESSMENT:     1. Paroxysmal atrial fibrillation (HCC)   2. Paroxysmal atrial flutter (HCC)   3. Encounter for long-term (current) use of high-risk medication   4. Cardiac pacemaker in situ     PLAN:     In order of problems listed above:     #Persistent atrial fibrillation and flutter Recurrent and symptomatic.  Previously ablated in 2018.  Previously on flecainide, dronedarone and now dofetilide.   Discussed treatment options today for AF including antiarrhythmic drug therapy and ablation. Discussed risks, recovery and likelihood of success with each treatment strategy. Risk, benefits, and alternatives to EP study and ablation for afib were discussed. These risks include but are not limited to stroke, bleeding, vascular damage, tamponade, perforation, damage to the esophagus, lungs, phrenic nerve and other structures, pulmonary vein stenosis, worsening renal function, coronary vasospasm and death.  Discussed potential need for repeat ablation procedures and antiarrhythmic drugs after an initial ablation. The patient understands these risk and wishes to proceed.  We will therefore proceed with catheter ablation at  the next available time.  Carto, ICE, anesthesia are requested for the procedure.  Will also obtain CT PV protocol prior to the procedure to exclude LAA thrombus and further evaluate atrial anatomy.   Continue Tikosyn.  QTc stable for continued use on today's EKG.       #Permanent pacemaker in situ Device functioning appropriately.  Continue remote monitoring.     Presents for PVI today. Procedure reviewed.   Signed, Steffanie Dunn, MD, [facility], [facility] 02/09/2023 Electrophysiology [facility]

## 2023-02-09 NOTE — Anesthesia Preprocedure Evaluation (Signed)
Anesthesia Evaluation  Patient identified by MRN, date of birth, ID band Patient awake    Reviewed: Allergy & Precautions, H&P , NPO status , Patient's Chart, lab work & pertinent test results  History of Anesthesia Complications (+) PONV and history of anesthetic complications  Airway Mallampati: II   Neck ROM: full    Dental   Pulmonary neg pulmonary ROS   breath sounds clear to auscultation       Cardiovascular hypertension, + dysrhythmias Atrial Fibrillation + pacemaker  Rhythm:irregular Rate:Normal  TTE (03/2022): EF 60-65%, mod MR, biatrial enlargement.   Neuro/Psych   Anxiety        GI/Hepatic ,GERD  ,,(+)     substance abuse  alcohol use, Hepatitis -, C  Endo/Other    Renal/GU      Musculoskeletal  (+) Arthritis ,    Abdominal   Peds  Hematology   Anesthesia Other Findings   Reproductive/Obstetrics                             Anesthesia Physical Anesthesia Plan  ASA: 3  Anesthesia Plan: General   Post-op Pain Management:    Induction: Intravenous  PONV Risk Score and Plan: 4 or greater and Ondansetron, Dexamethasone, Midazolam and Treatment may vary due to age or medical condition  Airway Management Planned: Oral ETT  Additional Equipment:   Intra-op Plan:   Post-operative Plan: Extubation in OR  Informed Consent: I have reviewed the patients History and Physical, chart, labs and discussed the procedure including the risks, benefits and alternatives for the proposed anesthesia with the patient or authorized representative who has indicated his/her understanding and acceptance.     Dental advisory given  Plan Discussed with: CRNA, Surgeon and Anesthesiologist  Anesthesia Plan Comments:        Anesthesia Quick Evaluation

## 2023-02-09 NOTE — Anesthesia Procedure Notes (Signed)
Procedure Name: Intubation Date/Time: 02/09/2023 10:44 AM  Performed by: Achille Rich, MDPre-anesthesia Checklist: Patient identified, Emergency Drugs available, Suction available and Patient being monitored Patient Re-evaluated:Patient Re-evaluated prior to induction Oxygen Delivery Method: Circle system utilized Preoxygenation: Pre-oxygenation with 100% oxygen Induction Type: IV induction Ventilation: Mask ventilation without difficulty Laryngoscope Size: Mac and 3 Grade View: Grade I Tube type: Oral Tube size: 7.0 mm Number of attempts: 1 Airway Equipment and Method: Stylet and Oral airway Placement Confirmation: ETT inserted through vocal cords under direct vision, positive ETCO2 and breath sounds checked- equal and bilateral Secured at: 22 cm Tube secured with: Tape Dental Injury: Teeth and Oropharynx as per pre-operative assessment

## 2023-02-10 ENCOUNTER — Encounter (HOSPITAL_COMMUNITY): Payer: Self-pay | Admitting: Cardiology

## 2023-02-10 LAB — POCT ACTIVATED CLOTTING TIME
Activated Clotting Time: 275 s
Activated Clotting Time: 299 s

## 2023-02-10 MED FILL — Fentanyl Citrate Preservative Free (PF) Inj 100 MCG/2ML: INTRAMUSCULAR | Qty: 1.5 | Status: AC

## 2023-02-10 MED FILL — Cefazolin Sodium-Dextrose IV Solution 2 GM/100ML-4%: INTRAVENOUS | Qty: 100 | Status: AC

## 2023-03-01 ENCOUNTER — Ambulatory Visit (HOSPITAL_COMMUNITY): Payer: PPO | Admitting: Physician Assistant

## 2023-03-09 ENCOUNTER — Ambulatory Visit (HOSPITAL_COMMUNITY): Payer: PPO | Admitting: Physician Assistant

## 2023-03-14 ENCOUNTER — Ambulatory Visit (HOSPITAL_COMMUNITY)
Admission: RE | Admit: 2023-03-14 | Discharge: 2023-03-14 | Disposition: A | Discharge status: Home / Self Care | Payer: PPO | Source: Ambulatory Visit | Attending: Internal Medicine | Admitting: Internal Medicine

## 2023-03-14 VITALS — BP 132/76 | HR 76 | Ht 67.0 in | Wt 156.6 lb

## 2023-03-14 DIAGNOSIS — I495 Sick sinus syndrome: Secondary | ICD-10-CM | POA: Diagnosis not present

## 2023-03-14 DIAGNOSIS — Z79899 Other long term (current) drug therapy: Secondary | ICD-10-CM | POA: Insufficient documentation

## 2023-03-14 DIAGNOSIS — I4819 Other persistent atrial fibrillation: Secondary | ICD-10-CM

## 2023-03-14 DIAGNOSIS — I10 Essential (primary) hypertension: Secondary | ICD-10-CM | POA: Insufficient documentation

## 2023-03-14 DIAGNOSIS — Z950 Presence of cardiac pacemaker: Secondary | ICD-10-CM | POA: Diagnosis not present

## 2023-03-14 DIAGNOSIS — D6869 Other thrombophilia: Secondary | ICD-10-CM | POA: Diagnosis not present

## 2023-03-14 DIAGNOSIS — Z5181 Encounter for therapeutic drug level monitoring: Secondary | ICD-10-CM | POA: Diagnosis not present

## 2023-03-14 DIAGNOSIS — Z7901 Long term (current) use of anticoagulants: Secondary | ICD-10-CM | POA: Diagnosis not present

## 2023-03-14 LAB — BASIC METABOLIC PANEL
Anion gap: 6 (ref 5–15)
BUN: 24 mg/dL — ABNORMAL HIGH (ref 8–23)
CO2: 29 mmol/L (ref 22–32)
Calcium: 9.5 mg/dL (ref 8.9–10.3)
Chloride: 104 mmol/L (ref 98–111)
Creatinine, Ser: 0.91 mg/dL (ref 0.44–1.00)
GFR, Estimated: >60 mL/min (ref >60)
Glucose, Bld: 98 mg/dL (ref 70–99)
Potassium: 3.9 mmol/L (ref 3.5–5.1)
Sodium: 139 mmol/L (ref 135–145)

## 2023-03-14 LAB — MAGNESIUM: Magnesium: 2.1 mg/dL (ref 1.7–2.4)

## 2023-03-14 MED ORDER — APIXABAN 5 MG PO TABS: 5.0000 mg | ORAL_TABLET | Freq: Two times a day (BID) | ORAL | 9 refills | Status: DC

## 2023-03-14 NOTE — Progress Notes (Signed)
Primary Care Physician: Georgann Housekeeper, MD Referring Physician:  device clinic EP: Dr. Linward Foster is a 82 y.o. female with a h/o atrial fib and PE/DVT 01/2016. She is has been treated with Rythmol and Tikosyn in the past. When her burden increased on Tikosyn, it was decided to pursue ablation 02/26/17.   She is in the afib clinic today, 04/10/19, for f/u of Tikosyn past ablation. She feels good. She has been staying in SR. She has a linq, has intermittent palpitations but per Dr. Jenel Lucks note disorganized atrial activity, but not afib. She is leaving for [place] in Feb/March,2021 for a couple of months. Continues on eliquis 5 mg bid for a CHA2DS2VASc score of 4, no bleeding issues.  F/u in afib clinic, 07/18/19. She reports that she had Covid 19 the earlier part of the month while she and her husband were at the beach. They believe the exposure was from  2 furniture  delivery men  that were in their house for awhile, as they did not come into contact with other people and did not eat in restaurants.  She  and her husband both came down with it and received the antibody infusion at [facility] hospital. She overall felt better after that and she feels their symptoms were fairly mild. She did not have any afib while she was sick with covid. Continues on tikosyn and eliquis.  F/u in afib clinic, 08/27/19, pt called to office saying that she is seeing HR's by BP cuff in the 40's at home even though she feels well. We ran a Link report that shows PAC's as well as episodes of afib that usually  last around mins to one hour. Pt does not feel these episodes. Her longest afib episode was 50 mins on 2/26 and 2/27 with v rates 120-150, again pt did not feel this. EKG today shows SR with pac's. She feels well today.  F/u in afib clinic, 08/27/20. Afib has been quiet recently. She saw Dr. Johney Frame 06/01/21 and by his assessment then, he did not feel she was a repeat ablation candidate. She continues on tikosyn. No  bleeding issues with eliquis.  F/u in afib clinic, 03/03/21. She remains on Tikosyn. One episode of afib that lasted around 24 hours and resolved on its own. She has an apple watch now so we discussed how she can track this as well as with her Linq. Last Linq report in September did not show any arrhythmia. Continues on eliquis 5 mg bid.  F/u in afib clinic, 10/13/21  for Tikosyn surveillance. Qt is stable. Only one episode of Afib lasting less than 8 hours. Remain complaint on Tikosyn.  She was dx with  early L breast CA in February, treated with lumpectomy and  will be finishing radiation soon. Has had a congested sough x 2 weeks. PCP has evaluated.   F/u in afib clinic, 12/28/21. She developed afib on Saturday. She has been complaint with Tikosyn and anticoagulation. No known triggers. She had her Linq explanted in May. She was in her garage this am and became lightheaded for a few seconds. Afther that she felt like she was back in SR. EKG today confirms this. She has some stress recently which may have contributed to the afib.   F/u in afib clinic 10/10 for Tikosyn surveillance but also reports that she has a syncopal episode this past Thursday night after drinking around 3/4 glass of wine. She may not have eaten that much  that day. Her husband saw her fall, she was out for less than one minute.she was out of rhythm when she regained consciousness.  She states she did not hit her head. She did not seek any type of medial attention.  She is also describing episodes of feeling dizzy  for just a few seconds at a time.no further syncope. While running ekg today,she went from sinus brady at 57 bpm to accelerated  junctional at 122 bpm for just a few minutes. With the faster rate she felt dizzy.    F/u in afib clinic, 08/11/22. She was notified that she was back in afib by device clinic on Monday with increase afib burden and ongoing afib. EKG today shows afib at 75 bpm. She was not aware that she was in afib.  She just got back from  [place] and even being  on vacation, it was stressful. They have recently made a move to [place]  and she is not happy there. She was drinking around 1 1/2 glasses of wine a night and admitted to high salt intake.   Follow up in the AF clinic 08/31/22. Patient noted to be in persistent atrial fibrillation by the device clinic. Patient reports she has felt well and has not been aware of her arrhythmia. She might have more fatigue with exertion but is not sure about this. No bleeding issues on anticoagulation.   Follow up in the AF clinic 03/14/23. Patient reports that she has done well since her ablation. She has had some afib noted on her Apple Watch but most strips show SR with PACs or PVCs. She does feel improved overall. She denies any chest pain, swallowing pain, or groin issues.   Today, she denies symptoms of palpitations, chest pain, shortness of breath, orthopnea, PND, lower extremity edema, dizziness, presyncope, syncope, or neurologic sequela. The patient is tolerating medications without difficulties and is otherwise without complaint today.   Past Medical History:  Diagnosis Date   Anxiety    Atrial fibrillation (HCC)    Bilateral lower extremity edema    Chronic, likely related to venous stasis    DJD (degenerative joint disease), lumbosacral    Dyslipidemia    Dysrhythmia    A fib   Essential hypertension    "dx'd recently" (10/06/2016)   GERD (gastroesophageal reflux disease)    History of hepatitis C virus infection ~ 1997   In remission   Lung granuloma (HCC) 2004   CT scan   Mitral regurgitation    04/30/21 echo: bileaflet prolapse with moderate MR   Osteopenia    Paroxysmal atrial flutter (HCC) 04/02/2022   Paroxysmal atrial tachycardia    PE (pulmonary embolism) 01/2016   PONV (postoperative nausea and vomiting)    Patient states she had extremem nausea after neck surgery several years ago   Spondylolisthesis    Status post epidural  injections 3 by neurosurgery  - Dr. Jeral Fruit    Varicose veins of both legs with edema    Vitamin D deficiency     Current Outpatient Medications  Medication Sig Dispense Refill   acetaminophen (TYLENOL) 325 MG tablet Take 2 tablets (650 mg total) by mouth every 4 (four) hours as needed for headache or mild pain. (Patient taking differently: Take 650 mg by mouth as needed for headache or mild pain.)     betamethasone valerate (VALISONE) 0.1 % cream Apply 1 application topically 2 (two) times daily as needed (itching.).     Cholecalciferol (VITAMIN D) 125 MCG (5000  UT) CAPS Take 5,000 Units by mouth in the morning.     diltiazem (CARDIZEM CD) 120 MG 24 hr capsule Take 1 capsule (120 mg total) by mouth at bedtime. 30 capsule 6   diltiazem (CARDIZEM) 30 MG tablet Take 1 tablet every 4 hours AS NEEDED for AFIB heart rate >100 as long as top BP >100. 30 tablet 1   dofetilide (TIKOSYN) 250 MCG capsule TAKE 1 CAPSULE BY MOUTH TWICE DAILY IN THE MORNING AND DINNER FOR HEART 180 capsule 2   furosemide (LASIX) 20 MG tablet Take 1 tablet (20 mg total) by mouth daily as needed for fluid or edema (weight gain of 3 lbs in 24 hrs or 5 lbs in 1 week). 30 tablet 3   levocetirizine (XYZAL) 5 MG tablet Take 5 mg by mouth at bedtime.     losartan (COZAAR) 25 MG tablet Take 25 mg by mouth in the morning.     Magnesium 250 MG TABS Take 250 mg by mouth at bedtime.     Multiple Vitamins-Minerals (MULTIVITAMIN WITH MINERALS) tablet Take 1 tablet by mouth daily.     Multiple Vitamins-Minerals (ZINC PO) Take 1 tablet by mouth daily.     pantoprazole (PROTONIX) 40 MG tablet Take 40 mg by mouth 2 (two) times daily.     potassium chloride (KLOR-CON) 10 MEQ tablet Take 1 tablet (10 mEq total) by mouth daily. 30 tablet 6   Probiotic Product (PROBIOTIC DAILY PO) Take 1 tablet by mouth every morning. May take 2 tablets as needed     propranolol (INDERAL) 20 MG tablet Take 1 tablet (20 mg total) by mouth 2 (two) times daily. 180  tablet 3   apixaban (ELIQUIS) 5 MG TABS tablet Take 1 tablet (5 mg total) by mouth 2 (two) times daily. 60 tablet 9   No current facility-administered medications for this encounter.    ROS- All systems are reviewed and negative except as per the HPI above  Physical Exam: Vitals:   03/14/23 0948  BP: 132/76  Pulse: 76  Weight: 71 kg  Height: 5\' 7"  (1.702 m)    Wt Readings from Last 3 Encounters:  03/14/23 71 kg  02/09/23 69.9 kg  11/15/22 72.6 kg    GEN: Well nourished, well developed in no acute distress NECK: No JVD; No carotid bruits CARDIAC: Regular rate and rhythm with occasional ectopy, no murmurs, rubs, gallops RESPIRATORY:  Clear to auscultation without rales, wheezing or rhonchi  ABDOMEN: Soft, non-tender, non-distended EXTREMITIES:  No edema; No deformity    EKG today demonstrates A paced rhythm, PVCs Vent. rate 76 BPM PR interval 352 ms QRS duration 78 ms QT/QTcB 454/510 ms (495 ms measured manually)    CHA2DS2-VASc Score = 4  The patient's score is based upon: CHF History: 0 HTN History: 1 Diabetes History: 0 Stroke History: 0 Vascular Disease History: 0 Age Score: 2 Gender Score: 1       ASSESSMENT AND PLAN: Persistent Atrial Fibrillation (ICD10:  I48.19) The patient's CHA2DS2-VASc score is 4, indicating a 4.8% annual risk of stroke.   S/p afib ablation in 2018 and 02/09/23 Overall burden and symptoms have improved. Hopefully, she will continue to improve as she heals from the ablation.  Continue dofetilide 250 mcg BID, QT acceptable Check bmet/mag today Continue Eliquis 5 mg BID Continue diltiazem 120 mg daily with 30 mg PRN for heart racing.  Secondary Hypercoagulable State (ICD10:  D68.69) The patient is at significant risk for stroke/thromboembolism based upon her CHA2DS2-VASc Score  of 4.  Continue Apixaban (Eliquis).   HTN Stable on current regimen  Tachybradycardia syndrome S/p PPM, followed by Dr Lalla Brothers and the device  clinic   Follow up with Dr Lalla Brothers as scheduled.    Jorja Loa PA-C Afib Clinic [facility] [address] [place], [place] [address] [phone]

## 2023-03-20 DIAGNOSIS — M25561 Pain in right knee: Secondary | ICD-10-CM | POA: Diagnosis not present

## 2023-03-20 DIAGNOSIS — M25562 Pain in left knee: Secondary | ICD-10-CM | POA: Diagnosis not present

## 2023-04-04 ENCOUNTER — Ambulatory Visit (INDEPENDENT_AMBULATORY_CARE_PROVIDER_SITE_OTHER): Payer: PPO

## 2023-04-04 DIAGNOSIS — I480 Paroxysmal atrial fibrillation: Secondary | ICD-10-CM

## 2023-04-04 DIAGNOSIS — R55 Syncope and collapse: Secondary | ICD-10-CM

## 2023-04-05 LAB — CUP PACEART REMOTE DEVICE CHECK
Battery Remaining Longevity: 155 mo
Battery Voltage: 3.05 V
Brady Statistic AP VP Percent: 2.21 %
Brady Statistic AP VS Percent: 75.82 %
Brady Statistic AS VP Percent: 0.7 %
Brady Statistic AS VS Percent: 21.28 %
Brady Statistic RA Percent Paced: 73.19 %
Brady Statistic RV Percent Paced: 3.54 %
Date Time Interrogation Session: 20241015143355
Implantable Lead Connection Status: 753985
Implantable Lead Connection Status: 753985
Implantable Lead Implant Date: 20231013
Implantable Lead Implant Date: 20231013
Implantable Lead Location: 753859
Implantable Lead Location: 753860
Implantable Lead Model: 3830
Implantable Lead Model: 5076
Implantable Pulse Generator Implant Date: 20231013
Lead Channel Impedance Value: 304 Ohm
Lead Channel Impedance Value: 361 Ohm
Lead Channel Impedance Value: 437 Ohm
Lead Channel Impedance Value: 627 Ohm
Lead Channel Pacing Threshold Amplitude: 0.625 V
Lead Channel Pacing Threshold Amplitude: 1.125 V
Lead Channel Pacing Threshold Pulse Width: 0.4 ms
Lead Channel Pacing Threshold Pulse Width: 0.4 ms
Lead Channel Sensing Intrinsic Amplitude: 0.25 mV
Lead Channel Sensing Intrinsic Amplitude: 0.25 mV
Lead Channel Sensing Intrinsic Amplitude: 3.125 mV
Lead Channel Sensing Intrinsic Amplitude: 3.125 mV
Lead Channel Setting Pacing Amplitude: 1.5 V
Lead Channel Setting Pacing Amplitude: 2.25 V
Lead Channel Setting Pacing Pulse Width: 0.4 ms
Lead Channel Setting Sensing Sensitivity: 1.2 mV
Zone Setting Status: 755011

## 2023-04-18 DIAGNOSIS — I10 Essential (primary) hypertension: Secondary | ICD-10-CM | POA: Diagnosis not present

## 2023-04-18 DIAGNOSIS — B1920 Unspecified viral hepatitis C without hepatic coma: Secondary | ICD-10-CM | POA: Diagnosis not present

## 2023-04-18 DIAGNOSIS — Z950 Presence of cardiac pacemaker: Secondary | ICD-10-CM | POA: Diagnosis not present

## 2023-04-18 DIAGNOSIS — D6869 Other thrombophilia: Secondary | ICD-10-CM | POA: Diagnosis not present

## 2023-04-18 DIAGNOSIS — Z86711 Personal history of pulmonary embolism: Secondary | ICD-10-CM | POA: Diagnosis not present

## 2023-04-18 DIAGNOSIS — K224 Dyskinesia of esophagus: Secondary | ICD-10-CM | POA: Diagnosis not present

## 2023-04-18 DIAGNOSIS — I5030 Unspecified diastolic (congestive) heart failure: Secondary | ICD-10-CM | POA: Diagnosis not present

## 2023-04-18 DIAGNOSIS — Z853 Personal history of malignant neoplasm of breast: Secondary | ICD-10-CM | POA: Diagnosis not present

## 2023-04-18 DIAGNOSIS — N1831 Chronic kidney disease, stage 3a: Secondary | ICD-10-CM | POA: Diagnosis not present

## 2023-04-18 DIAGNOSIS — I130 Hypertensive heart and chronic kidney disease with heart failure and stage 1 through stage 4 chronic kidney disease, or unspecified chronic kidney disease: Secondary | ICD-10-CM | POA: Diagnosis not present

## 2023-04-18 DIAGNOSIS — K219 Gastro-esophageal reflux disease without esophagitis: Secondary | ICD-10-CM | POA: Diagnosis not present

## 2023-04-18 DIAGNOSIS — I4821 Permanent atrial fibrillation: Secondary | ICD-10-CM | POA: Diagnosis not present

## 2023-04-21 NOTE — Progress Notes (Signed)
Remote pacemaker transmission.   

## 2023-05-02 ENCOUNTER — Other Ambulatory Visit (HOSPITAL_COMMUNITY): Payer: Self-pay | Admitting: Physician Assistant

## 2023-05-24 ENCOUNTER — Ambulatory Visit: Payer: PPO | Attending: Cardiology | Admitting: Cardiology

## 2023-05-24 ENCOUNTER — Encounter: Payer: Self-pay | Admitting: Cardiology

## 2023-05-24 ENCOUNTER — Other Ambulatory Visit: Payer: Self-pay | Admitting: Cardiology

## 2023-05-24 VITALS — BP 138/88 | HR 64 | Ht 67.0 in | Wt 157.0 lb

## 2023-05-24 DIAGNOSIS — Z79899 Other long term (current) drug therapy: Secondary | ICD-10-CM

## 2023-05-24 DIAGNOSIS — I4819 Other persistent atrial fibrillation: Secondary | ICD-10-CM | POA: Diagnosis not present

## 2023-05-24 DIAGNOSIS — I4719 Other supraventricular tachycardia: Secondary | ICD-10-CM | POA: Diagnosis not present

## 2023-05-24 DIAGNOSIS — Z950 Presence of cardiac pacemaker: Secondary | ICD-10-CM | POA: Diagnosis not present

## 2023-05-24 NOTE — Patient Instructions (Signed)
Medication Instructions:  Your physician recommends that you continue on your current medications as directed. Please refer to the Current Medication list given to you today.  *If you need a refill on your cardiac medications before your next appointment, please call your pharmacy*   Lab Work: TODAY: BMET and Mg  If you have labs (blood work) drawn today and your tests are completely normal, you will receive your results only by: MyChart Message (if you have MyChart) OR A paper copy in the mail If you have any lab test that is abnormal or we need to change your treatment, we will call you to review the results.   Follow-Up: At [facility], you and your health needs are our priority.  As part of our continuing mission to provide you with exceptional heart care, we have created designated Provider Care Teams.  These Care Teams include your primary Cardiologist (physician) and Advanced Practice Providers (APPs -  Physician Assistants and Nurse Practitioners) who all work together to provide you with the care you need, when you need it.   Your next appointment:   4 months  Provider:   You will see one of the following Advanced Practice Providers on your designated Care Team:   Christy [name], Christy [name] [address] [place], [place] Christy Don, NP Christy Brim, NP

## 2023-05-24 NOTE — Progress Notes (Signed)
Electrophysiology Office Follow up Visit Note:    Date:  05/24/2023   ID:  Christy [name], DOB 04-22-1941, MRN [id number]  PCP:  Georgann Housekeeper, MD  [facility] Cardiologist:  None  [facility] Electrophysiologist:  Lanier Prude, MD    Interval History:     Christy [name] is a 82 y.o. female who presents for a follow up visit.   The patient had an atrial fibrillation ablation on February 09, 2023.  During the procedure the right pulmonary veins were reisolated and the posterior wall was ablated.  She is all [place] in the A-fib clinic on March 14, 2023.  At that appointment she reported an improved burden of atrial fibrillation.     Discussed the use of AI scribe software for clinical note transcription with the patient, who gave verbal consent to proceed.  History of Present Illness   The patient, with a history of atrial fibrillation, presents for a follow-up visit after undergoing ablation. She reports feeling 'much better' since the procedure. She has noticed a significant improvement in her heart rhythm, which is confirmed by her device readings. The patient also uses a watch to monitor her EKG, although she mentions it has recently stopped working.  In addition to her cardiac treatment, the patient is also on Eliquis and Tikosyn for her atrial fibrillation. She reports that this combination of medications seems to be effective.            Past medical, surgical, social and family history were reviewed.  ROS:   Please see the history of present illness.    All other systems reviewed and are negative.  EKGs/Labs/Other Studies Reviewed:    The following studies were reviewed today:  May 24, 2023 in clinic device interrogation personally reviewed Batter and lead parameters stable. AF burden 0% No changes today.    EKG Interpretation Date/Time:  Wednesday May 24 2023 15:51:35 EST Ventricular Rate:  71 PR Interval:  304 QRS Duration:  80 QT  Interval:  412 QTC Calculation: 447 R Axis:   49  Text Interpretation: Sinus rhythm with 1st degree A-V block with Premature supraventricular complexes Intermittent atrial pacing Confirmed by Steffanie Dunn [phone]) on 05/24/2023 8:14:06 PM    Physical Exam:    VS:  BP 138/88   Pulse 64   Ht 5\' 7"  (1.702 m)   Wt 157 lb (71.2 kg)   SpO2 98%   BMI 24.59 kg/m     Wt Readings from Last 3 Encounters:  05/24/23 157 lb (71.2 kg)  03/14/23 156 lb 9.6 oz (71 kg)  02/09/23 154 lb (69.9 kg)     GEN: no distress CARD: RRR, No MRG.  CIED pocket well-healed RESP: No IWOB. CTAB.      ASSESSMENT:    1. PAT (paroxysmal atrial tachycardia) (HCC)   2. Persistent atrial fibrillation (HCC)   3. Cardiac pacemaker in situ   4. Encounter for long-term (current) use of high-risk medication    PLAN:    In order of problems listed above:  Assessment and Plan  #Persistent atrial fibrillation #High risk med monitoring-Tikosyn Doing well after catheter ablation in August.  Significantly improved burden of atrial fibrillation after her redo ablation.  QTc acceptable for ongoing use of Tikosyn.  For now, would continue with the current medication regimen.  She will continue Eliquis for stroke prophylaxis.  Repeat BMP and magnesium today.  #Hypertension At goal today.  Recommend checking blood pressures 1-2 times per week at home  and recording the values.  Recommend bringing these recordings to the primary care physician. Continue current medication regimen.  Follow-up 4 months with APP.          Signed, Steffanie Dunn, MD, [facility], [facility] 05/24/2023 8:15 PM    Electrophysiology [facility]

## 2023-05-25 ENCOUNTER — Other Ambulatory Visit (HOSPITAL_COMMUNITY): Payer: Self-pay | Admitting: Physician Assistant

## 2023-05-25 LAB — BASIC METABOLIC PANEL
BUN/Creatinine Ratio: 25 (ref 12–28)
BUN: 26 mg/dL (ref 8–27)
CO2: 25 mmol/L (ref 20–29)
Calcium: 9.7 mg/dL (ref 8.7–10.3)
Chloride: 106 mmol/L (ref 96–106)
Creatinine, Ser: 1.03 mg/dL — ABNORMAL HIGH (ref 0.57–1.00)
Glucose: 72 mg/dL (ref 70–99)
Potassium: 4.7 mmol/L (ref 3.5–5.2)
Sodium: 145 mmol/L — ABNORMAL HIGH (ref 134–144)
eGFR: 54 mL/min/{1.73_m2} — ABNORMAL LOW (ref >59)

## 2023-05-25 LAB — MAGNESIUM: Magnesium: 2.1 mg/dL (ref 1.6–2.3)

## 2023-06-03 LAB — CUP PACEART INCLINIC DEVICE CHECK
Date Time Interrogation Session: 20241204145856
Implantable Lead Connection Status: 753985
Implantable Lead Connection Status: 753985
Implantable Lead Implant Date: 20231013
Implantable Lead Implant Date: 20231013
Implantable Lead Location: 753859
Implantable Lead Location: 753860
Implantable Lead Model: 3830
Implantable Lead Model: 5076
Implantable Pulse Generator Implant Date: 20231013

## 2023-06-20 ENCOUNTER — Encounter: Payer: PPO | Admitting: Cardiology

## 2023-07-04 ENCOUNTER — Ambulatory Visit (INDEPENDENT_AMBULATORY_CARE_PROVIDER_SITE_OTHER): Payer: PPO

## 2023-07-04 DIAGNOSIS — R55 Syncope and collapse: Secondary | ICD-10-CM | POA: Diagnosis not present

## 2023-07-05 LAB — CUP PACEART REMOTE DEVICE CHECK
Battery Remaining Longevity: 152 mo
Battery Voltage: 3.04 V
Brady Statistic AP VP Percent: 1.14 %
Brady Statistic AP VS Percent: 80.15 %
Brady Statistic AS VP Percent: 0.49 %
Brady Statistic AS VS Percent: 18.21 %
Brady Statistic RA Percent Paced: 73.31 %
Brady Statistic RV Percent Paced: 3.82 %
Date Time Interrogation Session: 20250114122147
Implantable Lead Connection Status: 753985
Implantable Lead Connection Status: 753985
Implantable Lead Implant Date: 20231013
Implantable Lead Implant Date: 20231013
Implantable Lead Location: 753859
Implantable Lead Location: 753860
Implantable Lead Model: 3830
Implantable Lead Model: 5076
Implantable Pulse Generator Implant Date: 20231013
Lead Channel Impedance Value: 304 Ohm
Lead Channel Impedance Value: 361 Ohm
Lead Channel Impedance Value: 456 Ohm
Lead Channel Impedance Value: 608 Ohm
Lead Channel Pacing Threshold Amplitude: 0.5 V
Lead Channel Pacing Threshold Amplitude: 1 V
Lead Channel Pacing Threshold Pulse Width: 0.4 ms
Lead Channel Pacing Threshold Pulse Width: 0.4 ms
Lead Channel Sensing Intrinsic Amplitude: 0.25 mV
Lead Channel Sensing Intrinsic Amplitude: 0.25 mV
Lead Channel Sensing Intrinsic Amplitude: 3.25 mV
Lead Channel Sensing Intrinsic Amplitude: 3.25 mV
Lead Channel Setting Pacing Amplitude: 1.5 V
Lead Channel Setting Pacing Amplitude: 2.25 V
Lead Channel Setting Pacing Pulse Width: 0.4 ms
Lead Channel Setting Sensing Sensitivity: 1.2 mV
Zone Setting Status: 755011

## 2023-07-09 ENCOUNTER — Encounter: Payer: Self-pay | Admitting: Cardiology

## 2023-08-17 NOTE — Addendum Note (Signed)
 Addended by: Elease Etienne A on: 08/17/2023 01:26 PM   Modules accepted: Orders

## 2023-08-17 NOTE — Progress Notes (Signed)
 Remote pacemaker transmission.

## 2023-08-24 ENCOUNTER — Encounter: Payer: Self-pay | Admitting: Adult Health

## 2023-08-28 DIAGNOSIS — N390 Urinary tract infection, site not specified: Secondary | ICD-10-CM | POA: Diagnosis not present

## 2023-09-06 DIAGNOSIS — Z01419 Encounter for gynecological examination (general) (routine) without abnormal findings: Secondary | ICD-10-CM | POA: Diagnosis not present

## 2023-09-06 DIAGNOSIS — L900 Lichen sclerosus et atrophicus: Secondary | ICD-10-CM | POA: Diagnosis not present

## 2023-09-06 DIAGNOSIS — N952 Postmenopausal atrophic vaginitis: Secondary | ICD-10-CM | POA: Diagnosis not present

## 2023-09-06 DIAGNOSIS — Z6825 Body mass index (BMI) 25.0-25.9, adult: Secondary | ICD-10-CM | POA: Diagnosis not present

## 2023-09-08 ENCOUNTER — Other Ambulatory Visit: Payer: Self-pay

## 2023-09-08 ENCOUNTER — Other Ambulatory Visit (HOSPITAL_COMMUNITY): Payer: Self-pay | Admitting: Cardiology

## 2023-09-08 MED ORDER — POTASSIUM CHLORIDE ER 10 MEQ PO TBCR: 10.0000 meq | EXTENDED_RELEASE_TABLET | Freq: Every day | ORAL | 9 refills | Status: DC

## 2023-09-08 MED ORDER — POTASSIUM CHLORIDE ER 10 MEQ PO TBCR: 10.0000 meq | EXTENDED_RELEASE_TABLET | Freq: Every day | ORAL | 2 refills | Status: AC

## 2023-09-29 DIAGNOSIS — M25511 Pain in right shoulder: Secondary | ICD-10-CM | POA: Diagnosis not present

## 2023-10-03 ENCOUNTER — Ambulatory Visit: Payer: PPO

## 2023-10-03 DIAGNOSIS — R55 Syncope and collapse: Secondary | ICD-10-CM | POA: Diagnosis not present

## 2023-10-05 LAB — CUP PACEART REMOTE DEVICE CHECK
Battery Remaining Longevity: 151 mo
Battery Voltage: 3.03 V
Brady Statistic AP VP Percent: 3.96 %
Brady Statistic AP VS Percent: 70.91 %
Brady Statistic AS VP Percent: 3.99 %
Brady Statistic AS VS Percent: 21.13 %
Brady Statistic RA Percent Paced: 63.91 %
Brady Statistic RV Percent Paced: 11.38 %
Date Time Interrogation Session: 20250415055832
Implantable Lead Connection Status: 753985
Implantable Lead Connection Status: 753985
Implantable Lead Implant Date: 20231013
Implantable Lead Implant Date: 20231013
Implantable Lead Location: 753859
Implantable Lead Location: 753860
Implantable Lead Model: 3830
Implantable Lead Model: 5076
Implantable Pulse Generator Implant Date: 20231013
Lead Channel Impedance Value: 323 Ohm
Lead Channel Impedance Value: 380 Ohm
Lead Channel Impedance Value: 437 Ohm
Lead Channel Impedance Value: 608 Ohm
Lead Channel Pacing Threshold Amplitude: 0.5 V
Lead Channel Pacing Threshold Amplitude: 0.875 V
Lead Channel Pacing Threshold Pulse Width: 0.4 ms
Lead Channel Pacing Threshold Pulse Width: 0.4 ms
Lead Channel Sensing Intrinsic Amplitude: 0.375 mV
Lead Channel Sensing Intrinsic Amplitude: 0.375 mV
Lead Channel Sensing Intrinsic Amplitude: 4.125 mV
Lead Channel Sensing Intrinsic Amplitude: 4.125 mV
Lead Channel Setting Pacing Amplitude: 1.5 V
Lead Channel Setting Pacing Amplitude: 2 V
Lead Channel Setting Pacing Pulse Width: 0.4 ms
Lead Channel Setting Sensing Sensitivity: 1.2 mV
Zone Setting Status: 755011

## 2023-10-14 ENCOUNTER — Encounter: Payer: Self-pay | Admitting: Cardiology

## 2023-10-21 DIAGNOSIS — M1712 Unilateral primary osteoarthritis, left knee: Secondary | ICD-10-CM | POA: Diagnosis not present

## 2023-10-23 ENCOUNTER — Telehealth: Payer: Self-pay | Admitting: Adult Health

## 2023-10-23 ENCOUNTER — Telehealth: Payer: Self-pay | Admitting: Physician Assistant

## 2023-10-23 NOTE — Telephone Encounter (Signed)
 I informed Christy [name] of her rescheduled appointment, [name] stated that she will return my call if the appointment does not work. I provided [name] my direct line.

## 2023-10-23 NOTE — Telephone Encounter (Signed)
 I returned Christy [name]'s call from a voicemail I received. Nancyann Aye asked me to confirm her appointment time with her and she is aware and acceptable of all appointment details for 5/23.

## 2023-10-24 ENCOUNTER — Ambulatory Visit: Payer: PPO | Admitting: Adult Health

## 2023-10-24 ENCOUNTER — Other Ambulatory Visit: Payer: PPO

## 2023-11-01 DIAGNOSIS — I4821 Permanent atrial fibrillation: Secondary | ICD-10-CM | POA: Diagnosis not present

## 2023-11-01 DIAGNOSIS — I5030 Unspecified diastolic (congestive) heart failure: Secondary | ICD-10-CM | POA: Diagnosis not present

## 2023-11-01 DIAGNOSIS — Z23 Encounter for immunization: Secondary | ICD-10-CM | POA: Diagnosis not present

## 2023-11-01 DIAGNOSIS — N1831 Chronic kidney disease, stage 3a: Secondary | ICD-10-CM | POA: Diagnosis not present

## 2023-11-01 DIAGNOSIS — E782 Mixed hyperlipidemia: Secondary | ICD-10-CM | POA: Diagnosis not present

## 2023-11-01 DIAGNOSIS — D6869 Other thrombophilia: Secondary | ICD-10-CM | POA: Diagnosis not present

## 2023-11-01 DIAGNOSIS — Z86711 Personal history of pulmonary embolism: Secondary | ICD-10-CM | POA: Diagnosis not present

## 2023-11-01 DIAGNOSIS — E559 Vitamin D deficiency, unspecified: Secondary | ICD-10-CM | POA: Diagnosis not present

## 2023-11-01 DIAGNOSIS — Z950 Presence of cardiac pacemaker: Secondary | ICD-10-CM | POA: Diagnosis not present

## 2023-11-01 DIAGNOSIS — Z0000 Encounter for general adult medical examination without abnormal findings: Secondary | ICD-10-CM | POA: Diagnosis not present

## 2023-11-01 DIAGNOSIS — Z1331 Encounter for screening for depression: Secondary | ICD-10-CM | POA: Diagnosis not present

## 2023-11-01 DIAGNOSIS — Z853 Personal history of malignant neoplasm of breast: Secondary | ICD-10-CM | POA: Diagnosis not present

## 2023-11-01 DIAGNOSIS — I10 Essential (primary) hypertension: Secondary | ICD-10-CM | POA: Diagnosis not present

## 2023-11-03 ENCOUNTER — Other Ambulatory Visit: Payer: Self-pay

## 2023-11-03 MED ORDER — DOFETILIDE 250 MCG PO CAPS: ORAL_CAPSULE | ORAL | 2 refills | Status: DC

## 2023-11-09 ENCOUNTER — Other Ambulatory Visit

## 2023-11-09 ENCOUNTER — Other Ambulatory Visit: Payer: Self-pay | Admitting: Physician Assistant

## 2023-11-09 ENCOUNTER — Ambulatory Visit: Admitting: Adult Health

## 2023-11-09 DIAGNOSIS — C50412 Malignant neoplasm of upper-outer quadrant of left female breast: Secondary | ICD-10-CM

## 2023-11-10 ENCOUNTER — Inpatient Hospital Stay: Attending: Physician Assistant

## 2023-11-10 ENCOUNTER — Inpatient Hospital Stay: Admitting: Physician Assistant

## 2023-11-10 VITALS — BP 130/57 | HR 69 | Temp 97.9°F | Resp 17 | Ht 67.0 in | Wt 162.9 lb

## 2023-11-10 DIAGNOSIS — Z853 Personal history of malignant neoplasm of breast: Secondary | ICD-10-CM | POA: Insufficient documentation

## 2023-11-10 DIAGNOSIS — C50412 Malignant neoplasm of upper-outer quadrant of left female breast: Secondary | ICD-10-CM

## 2023-11-10 DIAGNOSIS — Z170 Estrogen receptor positive status [ER+]: Secondary | ICD-10-CM

## 2023-11-10 DIAGNOSIS — Z923 Personal history of irradiation: Secondary | ICD-10-CM | POA: Insufficient documentation

## 2023-11-10 LAB — CBC WITH DIFFERENTIAL (CANCER CENTER ONLY)
Abs Immature Granulocytes: 0.02 10*3/uL (ref 0.00–0.07)
Basophils Absolute: 0 10*3/uL (ref 0.0–0.1)
Basophils Relative: 1 %
Eosinophils Absolute: 0.1 10*3/uL (ref 0.0–0.5)
Eosinophils Relative: 1 %
HCT: 38.3 % (ref 36.0–46.0)
Hemoglobin: 12.7 g/dL (ref 12.0–15.0)
Immature Granulocytes: 0 %
Lymphocytes Relative: 20 %
Lymphs Abs: 1.1 10*3/uL (ref 0.7–4.0)
MCH: 32.2 pg (ref 26.0–34.0)
MCHC: 33.2 g/dL (ref 30.0–36.0)
MCV: 97 fL (ref 80.0–100.0)
Monocytes Absolute: 0.7 10*3/uL (ref 0.1–1.0)
Monocytes Relative: 14 %
Neutro Abs: 3.5 10*3/uL (ref 1.7–7.7)
Neutrophils Relative %: 64 %
Platelet Count: 197 10*3/uL (ref 150–400)
RBC: 3.95 MIL/uL (ref 3.87–5.11)
RDW: 13.2 % (ref 11.5–15.5)
WBC Count: 5.4 10*3/uL (ref 4.0–10.5)
nRBC: 0 % (ref 0.0–0.2)

## 2023-11-10 LAB — CMP (CANCER CENTER ONLY)
ALT: 13 U/L (ref 0–44)
AST: 20 U/L (ref 15–41)
Albumin: 4.6 g/dL (ref 3.5–5.0)
Alkaline Phosphatase: 60 U/L (ref 38–126)
Anion gap: 6 (ref 5–15)
BUN: 32 mg/dL — ABNORMAL HIGH (ref 8–23)
CO2: 29 mmol/L (ref 22–32)
Calcium: 9.5 mg/dL (ref 8.9–10.3)
Chloride: 105 mmol/L (ref 98–111)
Creatinine: 0.87 mg/dL (ref 0.44–1.00)
GFR, Estimated: >60 mL/min (ref >60)
Glucose, Bld: 91 mg/dL (ref 70–99)
Potassium: 4.4 mmol/L (ref 3.5–5.1)
Sodium: 140 mmol/L (ref 135–145)
Total Bilirubin: 0.9 mg/dL (ref 0.0–1.2)
Total Protein: 7.3 g/dL (ref 6.5–8.1)

## 2023-11-10 NOTE — Progress Notes (Unsigned)
 [facility] [phone]   Fax:([phone]) [phone]  PROGRESS NOTE  Patient Care Team: Jearldine Mina, MD as PCP - General (Internal Medicine) Boyce Byes, MD as PCP - Electrophysiology (Cardiology) Dareen Ebbing, MD as Consulting Physician (General Surgery) Sonja [place], MD as Consulting Physician (Hematology) Johna Myers, MD as Consulting Physician (Radiation Oncology)   CHIEF COMPLAINTS/PURPOSE OF CONSULTATION:  History of left breast cancer  Oncology History Overview Note   Cancer Staging  Malignant neoplasm of upper-outer quadrant of left breast in female, estrogen receptor positive (HCC) Staging form: Breast, AJCC 8th Edition - Clinical stage from 07/20/2021: Stage IA (cT1c, cN0, cM0, G1, ER+, PR+, HER2-) - Signed by Sonja Butler, MD on 07/27/2021    Malignant neoplasm of upper-outer quadrant of left breast in female, estrogen receptor positive (HCC)  07/15/2021 Mammogram   Exam: Breast Ultrasound - L  There is an irregular mass with a spiculated margin in the left breast at 12 o'clock posterior depth 8 cm from the nipple. It measures 1.4 x 1.1 x 0.9 cm. This irregular mass is hypoechoic. This correlated with mammography findings.  Ultrasound of the left axilla is negative for lymphadenopathy. Multiple normal lymph nodes are seen.  IMPRESSION: The irregular mass in the left breast is highly suggestive of malignancy.   07/20/2021 Cancer Staging   Staging form: Breast, AJCC 8th Edition - Clinical stage from 07/20/2021: Stage IA (cT1c, cN0, cM0, G1, ER+, PR+, HER2-) - Signed by Sonja Bazine, MD on 07/27/2021 Stage prefix: Initial diagnosis Histologic grading system: 3 grade system   07/20/2021 Initial Biopsy   Diagnosis Breast, left, needle core biopsy, left breast mass 12:00 8 cmfn - INVASIVE DUCTAL CARCINOMA - DUCTAL CARCINOMA IN SITU - SEE COMMENT Microscopic Comment Based on the biopsy, the carcinoma appears Nottingham grade 1-2 of 3 and measures 0.5  cm in greatest linear extent.  PROGNOSTIC INDICATORS Results: Estrogen Receptor: 100%, POSITIVE, STRONG STAINING INTENSITY Progesterone Receptor: 95%, POSITIVE, STRONG STAINING INTENSITY Proliferation Marker Ki67: 5%  FLUORESCENCE IN-SITU HYBRIDIZATION Results: GROUP 5: HER2 **NEGATIVE**   07/26/2021 Initial Diagnosis   Malignant neoplasm of upper-outer quadrant of left breast in female, estrogen receptor positive (HCC)    Genetic Testing   [facility] CancerNext-Expanded Panel was Negative. Report date is 08/08/2021.  The CancerNext-Expanded gene panel offered by [facility] and includes sequencing, rearrangement, and RNA analysis for the following 77 genes: AIP, ALK, APC, ATM, AXIN2, BAP1, BARD1, BLM, BMPR1A, BRCA1, BRCA2, BRIP1, CDC73, CDH1, CDK4, CDKN1B, CDKN2A, CHEK2, CTNNA1, DICER1, FANCC, FH, FLCN, GALNT12, KIF1B, LZTR1, MAX, MEN1, MET, MLH1, MSH2, MSH3, MSH6, MUTYH, NBN, NF1, NF2, NTHL1, PALB2, PHOX2B, PMS2, POT1, PRKAR1A, PTCH1, PTEN, RAD51C, RAD51D, RB1, RECQL, RET, SDHA, SDHAF2, SDHB, SDHC, SDHD, SMAD4, SMARCA4, SMARCB1, SMARCE1, STK11, SUFU, TMEM127, TP53, TSC1, TSC2, VHL and XRCC2 (sequencing and deletion/duplication); EGFR, EGLN1, HOXB13, KIT, MITF, PDGFRA, POLD1, and POLE (sequencing only); EPCAM and GREM1 (deletion/duplication only).    08/12/2021 Cancer Staging   Staging form: Breast, AJCC 8th Edition - Pathologic stage from 08/12/2021: Stage IA (pT1c, pN0, cM0, G2, ER+, PR+, HER2-) - Signed by Sonja [place], MD on 10/15/2021 Stage prefix: Initial diagnosis Histologic grading system: 3 grade system Residual tumor (R): R0 - None   08/12/2021 Definitive Surgery   FINAL MICROSCOPIC DIAGNOSIS:   A. BREAST, LEFT, LUMPECTOMY:  - Invasive and in situ ductal carcinoma, 1.7 cm.  - Invasive carcinoma involves superior margin.  - DCIS 0.1 cm from superior margin.  - Biopsy site and biopsy clip.  - See oncology  table.    08/25/2021 Pathology Results   FINAL MICROSCOPIC DIAGNOSIS:   A.  BREAST, LEFT SUPERIOR MARGIN, EXCISION:  - Findings consistent with previous lumpectomy.  - No residual carcinoma.  - Final superior margin negative for carcinoma.    09/20/2021 - 10/15/2021 Radiation Therapy   Site Technique Total Dose (Gy) Dose per Fx (Gy) Completed Fx Beam Energies  Breast, Left: Breast_L 3D 42.56/42.56 2.66 16/16 6X, 10X  Breast, Left: Breast_L_Bst specialPort 8/8 2 4/4 12E       HISTORY OF PRESENTING ILLNESS:  Christy [name] 83 y.o. female with medical history significant for ***  On review of the previous records ***  On exam today ***  MEDICAL HISTORY:  Past Medical History:  Diagnosis Date   Anxiety    Atrial fibrillation (HCC)    Bilateral lower extremity edema    Chronic, likely related to venous stasis    DJD (degenerative joint disease), lumbosacral    Dyslipidemia    Dysrhythmia    A fib   Essential hypertension    "dx'd recently" (10/06/2016)   GERD (gastroesophageal reflux disease)    History of hepatitis C virus infection ~ 1997   In remission   Lung granuloma (HCC) 2004   CT scan   Mitral regurgitation    04/30/21 echo: bileaflet prolapse with moderate MR   Osteopenia    Paroxysmal atrial flutter (HCC) 04/02/2022   Paroxysmal atrial tachycardia (HCC)    PE (pulmonary embolism) 01/2016   PONV (postoperative nausea and vomiting)    Patient states she had extremem nausea after neck surgery several years ago   Spondylolisthesis    Status post epidural injections 3 by neurosurgery  - Dr. Rica Chalet    Varicose veins of both legs with edema    Vitamin D deficiency     SURGICAL HISTORY: Past Surgical History:  Procedure Laterality Date   ATRIAL FIBRILLATION ABLATION N/A 03/09/2017   Procedure: Atrial Fibrillation Ablation;  Surgeon: Jolly Needle, MD;  Location: [facility] INVASIVE CV LAB;  Service: Cardiovascular;  Laterality: N/A;   ATRIAL FIBRILLATION ABLATION N/A 02/09/2023   Procedure: ATRIAL FIBRILLATION ABLATION;  Surgeon: Boyce Byes, MD;  Location: [facility] INVASIVE CV LAB;  Service: Cardiovascular;  Laterality: N/A;   BACK SURGERY     BREAST LUMPECTOMY WITH RADIOACTIVE SEED LOCALIZATION Left 08/12/2021   Procedure: LEFT BREAST LUMPECTOMY WITH RADIOACTIVE SEED LOCALIZATION;  Surgeon: Dareen Ebbing, MD;  Location: [facility];  Service: General;  Laterality: Left;   CARDIOVERSION N/A 03/21/2016   Procedure: CARDIOVERSION;  Surgeon: Liza Riggers, MD;  Location: [facility] ENDOSCOPY;  Service: Cardiovascular;  Laterality: N/A;   CARDIOVERSION N/A 09/19/2022   Procedure: CARDIOVERSION;  Surgeon: Loyde Rule, MD;  Location: [facility] ENDOSCOPY;  Service: Cardiovascular;  Laterality: N/A;   COMBINED HYSTEROSCOPY DIAGNOSTIC / D&C  2005   COMBINED HYSTEROSCOPY DIAGNOSTIC / D&C  1999   DILATION AND CURETTAGE OF UTERUS     implantable loop recorder removal  11/10/2021   LOOP RECORDER INSERTION N/A 10/31/2017   Procedure: LOOP RECORDER INSERTION;  Surgeon: Jolly Needle, MD;  Location: [facility] INVASIVE CV LAB;  Service: Cardiovascular;  Laterality: N/A;   NECK SURGERY  1995   "went in front and back; broken neck"   PACEMAKER IMPLANT N/A 04/01/2022   Procedure: PACEMAKER IMPLANT;  Surgeon: Boyce Byes, MD;  Location: [facility] INVASIVE CV LAB;  Service: Cardiovascular;  Laterality: N/A;   RE-EXCISION OF BREAST CANCER,SUPERIOR MARGINS Left 08/25/2021   Procedure: RE-EXCISION OF SUPERIOR MARGIN -  LEFT BREAST LUMPECTOMY;  Surgeon: Dareen Ebbing, MD;  Location: [facility] OR;  Service: General;  Laterality: Left;   TEE WITHOUT CARDIOVERSION N/A 03/08/2017   Procedure: TRANSESOPHAGEAL ECHOCARDIOGRAM (TEE);  Surgeon: Loyde Rule, MD;  Location: [facility] ENDOSCOPY;  Service: Cardiovascular;  Laterality: N/A;   TOE SURGERY Right    replaced joint   TONSILLECTOMY     TUBAL LIGATION Bilateral 1976   VAGINAL DELIVERY     x2    SOCIAL HISTORY: Social History   Socioeconomic History   Marital status: Married    Spouse name: Not on file   Number of children: 2    Years of education: Not on file   Highest education level: Not on file  Occupational History   Not on file  Tobacco Use   Smoking status: Never   Smokeless tobacco: Never  Vaping Use   Vaping status: Never Used  Substance and Sexual Activity   Alcohol use: Yes    Alcohol/week: 14.0 standard drinks of alcohol    Types: 14 Glasses of wine per week    Comment: occasional   Drug use: No   Sexual activity: Yes    Birth control/protection: Other-see comments    Comment: tubal ligation  Other Topics Concern   Not on file  Social History Narrative   She is a married mother of 1. Lives with her husband. 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 She has never smoked and drinks up to 10 glasses of wine or so week.   She usually exercises roughly 3 days a week doing yoga and plays golf. She is mostly limited by her "time constraints "   Social Drivers of Corporate investment banker Strain: Not on file  Food Insecurity: Low Risk  (10/18/2022)   [facility]   Hunger Vital Sign    Worried About Running Out of Food in the Last Year: Never true    Ran Out of Food in the Last Year: Never true  Transportation Needs: Not on file (10/18/2022)  Physical Activity: Not on file  Stress: Not on file  Social Connections: Not on file  Intimate Partner Violence: Not At Risk (03/31/2022)   Humiliation, Afraid, Rape, and Kick questionnaire    Fear of Current or Ex-Partner: No    Emotionally Abused: No    Physically Abused: No    Sexually Abused: No    FAMILY HISTORY: Family History  Problem Relation Age of Onset   Heart Problems Mother    CVA Mother    Heart attack Mother 37   CVA Father    Colon cancer Neg Hx    Clotting disorder Neg Hx     ALLERGIES:  is allergic to caffeine and epinephrine .  MEDICATIONS:  Current Outpatient Medications  Medication Sig Dispense Refill   acetaminophen  (TYLENOL ) 325 MG tablet Take 2 tablets (650 mg total) by mouth every  4 (four) hours as needed for headache or mild pain. (Patient taking differently: Take 650 mg by mouth as needed for headache or mild pain (pain score 1-3).)     apixaban  (ELIQUIS ) 5 MG TABS tablet Take 1 tablet (5 mg total) by mouth 2 (two) times daily. 60 tablet 9   betamethasone  valerate (VALISONE ) 0.1 % cream Apply 1 application topically 2 (two) times daily as needed (itching.).     Cholecalciferol (VITAMIN D) 125 MCG (5000 UT) CAPS Take 5,000 Units by mouth in the morning.     diltiazem  (CARDIZEM   CD) 120 MG 24 hr capsule TAKE 1 CAPSULE(120 MG) BY MOUTH AT BEDTIME 30 capsule 6   diltiazem  (CARDIZEM ) 30 MG tablet Take 1 tablet every 4 hours AS NEEDED for AFIB heart rate >100 as long as top BP >100. 30 tablet 1   dofetilide  (TIKOSYN ) 250 MCG capsule TAKE 1 CAPSULE BY MOUTH TWICE DAILY IN THE MORNING AND DINNER FOR HEART 180 capsule 2   furosemide  (LASIX ) 20 MG tablet Take 1 tablet (20 mg total) by mouth daily as needed for fluid or edema (weight gain of 3 lbs in 24 hrs or 5 lbs in 1 week). 30 tablet 3   levocetirizine (XYZAL) 5 MG tablet Take 5 mg by mouth at bedtime.     losartan  (COZAAR ) 25 MG tablet Take 25 mg by mouth in the morning.     Magnesium  250 MG TABS Take 250 mg by mouth at bedtime.     Multiple Vitamins-Minerals (MULTIVITAMIN WITH MINERALS) tablet Take 1 tablet by mouth daily.     Multiple Vitamins-Minerals (ZINC PO) Take 1 tablet by mouth daily.     pantoprazole  (PROTONIX ) 40 MG tablet Take 40 mg by mouth 2 (two) times daily.     potassium chloride  (KLOR-CON ) 10 MEQ tablet Take 1 tablet (10 mEq total) by mouth daily. 90 tablet 2   Probiotic Product (PROBIOTIC DAILY PO) Take 1 tablet by mouth every morning. May take 2 tablets as needed     propranolol  (INDERAL ) 20 MG tablet Take 1 tablet (20 mg total) by mouth 2 (two) times daily. 180 tablet 3   No current facility-administered medications for this visit.    REVIEW OF SYSTEMS:   Constitutional: ( - ) fevers, ( - )  chills , ( -  ) night sweats Eyes: ( - ) blurriness of vision, ( - ) double vision, ( - ) watery eyes Ears, nose, mouth, throat, and face: ( - ) mucositis, ( - ) sore throat Respiratory: ( - ) cough, ( - ) dyspnea, ( - ) wheezes Cardiovascular: ( - ) palpitation, ( - ) chest discomfort, ( - ) lower extremity swelling Gastrointestinal:  ( - ) nausea, ( - ) heartburn, ( - ) change in bowel habits Skin: ( - ) abnormal skin rashes Lymphatics: ( - ) new lymphadenopathy, ( - ) easy bruising Neurological: ( - ) numbness, ( - ) tingling, ( - ) new weaknesses Behavioral/Psych: ( - ) mood change, ( - ) new changes  All other systems were reviewed with the patient and are negative.  PHYSICAL EXAMINATION: ECOG PERFORMANCE STATUS: {CHL ONC ECOG ZO:[id number]}  Vitals:   11/10/23 1118  BP: (!) 130/57  Pulse: 69  Resp: 17  Temp: 97.9 F (36.6 C)  SpO2: 98%   Filed Weights   11/10/23 1118  Weight: 162 lb 14.4 oz (73.9 kg)    GENERAL: well appearing *** in NAD  SKIN: skin color, texture, turgor are normal, no rashes or significant lesions EYES: conjunctiva are pink and non-injected, sclera clear OROPHARYNX: no exudate, no erythema; lips, buccal mucosa, and tongue normal  NECK: supple, non-tender LYMPH:  no palpable lymphadenopathy in the cervical, axillary or supraclavicular lymph nodes.  LUNGS: clear to auscultation and percussion with normal breathing effort HEART: regular rate & rhythm and no murmurs and no lower extremity edema ABDOMEN: soft, non-tender, non-distended, normal bowel sounds Musculoskeletal: no cyanosis of digits and no clubbing  PSYCH: alert & oriented x 3, fluent speech NEURO: no focal motor/sensory deficits  LABORATORY  DATA:  I have reviewed the data as listed    Latest Ref Rng & Units 11/10/2023   10:58 AM 01/26/2023    9:45 AM 10/28/2022    9:20 AM  CBC  WBC 4.0 - 10.5 K/uL 5.4  4.3  5.4   Hemoglobin 12.0 - 15.0 g/dL 84.6  96.2  95.2   Hematocrit 36.0 - 46.0 % 38.3  34.5  36.9    Platelets 150 - 400 K/uL 197  196  222        Latest Ref Rng & Units 11/10/2023   10:58 AM 05/24/2023    4:52 PM 03/14/2023    9:59 AM  CMP  Glucose 70 - 99 mg/dL 91  72  98   BUN 8 - 23 mg/dL 32  26  24   Creatinine 0.44 - 1.00 mg/dL 8.41  3.24  4.01   Sodium 135 - 145 mmol/L 140  145  139   Potassium 3.5 - 5.1 mmol/L 4.4  4.7  3.9   Chloride 98 - 111 mmol/L 105  106  104   CO2 22 - 32 mmol/L 29  25  29    Calcium 8.9 - 10.3 mg/dL 9.5  9.7  9.5   Total Protein 6.5 - 8.1 g/dL 7.3     Total Bilirubin 0.0 - 1.2 mg/dL 0.9     Alkaline Phos 38 - 126 U/L 60     AST 15 - 41 U/L 20     ALT 0 - 44 U/L 13        PATHOLOGY: ***  BLOOD FILM: *** Review of the peripheral blood smear showed normal appearing white cells with neutrophils that were appropriately lobated and granulated. There was no predominance of bi-lobed or hyper-segmented neutrophils appreciated. No Dohle bodies were noted. There was no left shifting, immature forms or blasts noted. Lymphocytes remain normal in size without any predominance of large granular lymphocytes. Red cells show no anisopoikilocytosis, macrocytes , microcytes or polychromasia. There were no schistocytes, target cells, echinocytes, acanthocytes, dacrocytes, or stomatocytes.There was no rouleaux formation, nucleated red cells, or intra-cellular inclusions noted. The platelets are normal in size, shape, and color without any clumping evident.  RADIOGRAPHIC STUDIES: I have personally reviewed the radiological images as listed and agreed with the findings in the report. No results found.  ASSESSMENT & PLAN ***  Orders Placed This Encounter  Procedures   MM DIAG BREAST TOMO BILATERAL    Standing Status:   Future    Expected Date:   09/02/2024    Expiration Date:   11/09/2024    Reason for Exam (SYMPTOM  OR DIAGNOSIS REQUIRED):   history of left breast cancer, surveillance    Preferred imaging location?:   [facility]             [facility]    All questions  were answered. The patient knows to call the clinic with any problems, questions or concerns.  I have spent a total of {CHL ONC TIME VISIT - UUVOZ:[id number]} minutes of face-to-face and non-face-to-face time, preparing to see the patient, obtaining and/or reviewing separately obtained history, performing a medically appropriate examination, counseling and educating the patient, ordering medications/tests/procedures, referring and communicating with other health care professionals, documenting clinical information in the electronic health record, independently interpreting results and communicating results to the patient, and care coordination.   Wyline Hearing, PA-C Department of Hematology/Oncology [facility] Phone: [phone]

## 2023-11-17 NOTE — Progress Notes (Signed)
 Remote pacemaker transmission.

## 2023-11-17 NOTE — Addendum Note (Signed)
 Addended by: Lott Rouleau A on: 11/17/2023 02:16 PM   Modules accepted: Orders

## 2023-11-28 ENCOUNTER — Telehealth: Payer: Self-pay

## 2023-11-28 NOTE — Telephone Encounter (Signed)
 The pt app was having issues. I helped her send a manual transmission. Her monitor app is now working.

## 2023-12-19 DIAGNOSIS — H25819 Combined forms of age-related cataract, unspecified eye: Secondary | ICD-10-CM | POA: Diagnosis not present

## 2023-12-25 DIAGNOSIS — M25552 Pain in left hip: Secondary | ICD-10-CM | POA: Diagnosis not present

## 2023-12-26 ENCOUNTER — Other Ambulatory Visit (HOSPITAL_COMMUNITY): Payer: Self-pay | Admitting: Physician Assistant

## 2023-12-26 DIAGNOSIS — I4719 Other supraventricular tachycardia: Secondary | ICD-10-CM

## 2023-12-26 NOTE — Telephone Encounter (Signed)
 Prescription refill request for Eliquis  received. Indication: Afib  Last office visit: 05/24/23 Marny)  Scr: 0.87 (11/10/23)  Age: 83 Weight: 73.9kg  Appropriate dose. Refill sent.

## 2023-12-26 NOTE — Telephone Encounter (Signed)
 Refill Request.

## 2024-01-02 ENCOUNTER — Ambulatory Visit: Payer: PPO

## 2024-01-02 DIAGNOSIS — R55 Syncope and collapse: Secondary | ICD-10-CM

## 2024-01-03 LAB — CUP PACEART REMOTE DEVICE CHECK
Battery Remaining Longevity: 146 mo
Battery Voltage: 3.03 V
Brady Statistic AP VP Percent: 5.14 %
Brady Statistic AP VS Percent: 78.2 %
Brady Statistic AS VP Percent: 3.45 %
Brady Statistic AS VS Percent: 13.21 %
Brady Statistic RA Percent Paced: 74.61 %
Brady Statistic RV Percent Paced: 10.47 %
Date Time Interrogation Session: 20250715080233
Implantable Lead Connection Status: 753985
Implantable Lead Connection Status: 753985
Implantable Lead Implant Date: 20231013
Implantable Lead Implant Date: 20231013
Implantable Lead Location: 753859
Implantable Lead Location: 753860
Implantable Lead Model: 3830
Implantable Lead Model: 5076
Implantable Pulse Generator Implant Date: 20231013
Lead Channel Impedance Value: 323 Ohm
Lead Channel Impedance Value: 361 Ohm
Lead Channel Impedance Value: 418 Ohm
Lead Channel Impedance Value: 532 Ohm
Lead Channel Pacing Threshold Amplitude: 0.5 V
Lead Channel Pacing Threshold Amplitude: 0.75 V
Lead Channel Pacing Threshold Pulse Width: 0.4 ms
Lead Channel Pacing Threshold Pulse Width: 0.4 ms
Lead Channel Sensing Intrinsic Amplitude: 0.875 mV
Lead Channel Sensing Intrinsic Amplitude: 0.875 mV
Lead Channel Sensing Intrinsic Amplitude: 4 mV
Lead Channel Sensing Intrinsic Amplitude: 4 mV
Lead Channel Setting Pacing Amplitude: 1.5 V
Lead Channel Setting Pacing Amplitude: 2 V
Lead Channel Setting Pacing Pulse Width: 0.4 ms
Lead Channel Setting Sensing Sensitivity: 1.2 mV
Zone Setting Status: 755011

## 2024-01-06 ENCOUNTER — Ambulatory Visit: Payer: Self-pay | Admitting: Cardiology

## 2024-01-12 ENCOUNTER — Other Ambulatory Visit (HOSPITAL_COMMUNITY): Payer: Self-pay | Admitting: Physician Assistant

## 2024-01-12 ENCOUNTER — Other Ambulatory Visit (HOSPITAL_COMMUNITY): Payer: Self-pay | Admitting: *Deleted

## 2024-01-12 MED ORDER — DILTIAZEM HCL ER COATED BEADS 120 MG PO CP24: 120.0000 mg | ORAL_CAPSULE | Freq: Every day | ORAL | 1 refills | Status: DC

## 2024-01-17 DIAGNOSIS — M7062 Trochanteric bursitis, left hip: Secondary | ICD-10-CM | POA: Diagnosis not present

## 2024-01-22 DIAGNOSIS — M7062 Trochanteric bursitis, left hip: Secondary | ICD-10-CM | POA: Diagnosis not present

## 2024-02-05 DIAGNOSIS — R630 Anorexia: Secondary | ICD-10-CM | POA: Diagnosis not present

## 2024-02-05 DIAGNOSIS — R059 Cough, unspecified: Secondary | ICD-10-CM | POA: Diagnosis not present

## 2024-02-05 DIAGNOSIS — I771 Stricture of artery: Secondary | ICD-10-CM | POA: Diagnosis not present

## 2024-02-05 DIAGNOSIS — R5383 Other fatigue: Secondary | ICD-10-CM | POA: Diagnosis not present

## 2024-02-05 DIAGNOSIS — Z981 Arthrodesis status: Secondary | ICD-10-CM | POA: Diagnosis not present

## 2024-02-05 DIAGNOSIS — I700 Atherosclerosis of aorta: Secondary | ICD-10-CM | POA: Diagnosis not present

## 2024-02-14 ENCOUNTER — Ambulatory Visit (HOSPITAL_COMMUNITY)
Admission: RE | Admit: 2024-02-14 | Discharge: 2024-02-14 | Disposition: A | Discharge status: Home / Self Care | Source: Ambulatory Visit | Attending: Physician Assistant | Admitting: Physician Assistant

## 2024-02-14 VITALS — BP 112/62 | HR 67 | Ht 67.0 in | Wt 153.4 lb

## 2024-02-14 DIAGNOSIS — D6869 Other thrombophilia: Secondary | ICD-10-CM | POA: Diagnosis not present

## 2024-02-14 DIAGNOSIS — Z79899 Other long term (current) drug therapy: Secondary | ICD-10-CM

## 2024-02-14 DIAGNOSIS — I4819 Other persistent atrial fibrillation: Secondary | ICD-10-CM | POA: Diagnosis not present

## 2024-02-14 DIAGNOSIS — I4891 Unspecified atrial fibrillation: Secondary | ICD-10-CM

## 2024-02-14 DIAGNOSIS — Z5181 Encounter for therapeutic drug level monitoring: Secondary | ICD-10-CM | POA: Diagnosis not present

## 2024-02-14 NOTE — Progress Notes (Signed)
 Primary Care Physician: Ransom Other, MD Referring Physician:  device clinic EP: Dr. Cindie Ronal Christy [name] is a 83 y.o. female with a h/o atrial fib and PE/DVT 01/2016. She is has been treated with Rythmol and Tikosyn  in the past. When her burden increased on Tikosyn , it was decided to pursue ablation 02/26/17.   She is in the afib clinic today, 04/10/19, for f/u of Tikosyn  past ablation. She feels good. She has been staying in SR. She has a linq, has intermittent palpitations but per Dr. Merrick note disorganized atrial activity, but not afib. She is leaving for [place]  in Feb/March,2021 for a couple of months. Continues on eliquis  5 mg bid for a CHA2DS2VASc score of 4, no bleeding issues.  F/u in afib clinic, 07/18/19. She reports that she had Covid 19 the earlier part of the month while she and her husband were at the beach. They believe the exposure was from  2 furniture  delivery men  that were in their house for awhile, as they did not come into contact with other people and did not eat in restaurants.  She  and her husband both came down with it and received the antibody infusion at [facility] hospital. She overall felt better after that and she feels their symptoms were fairly mild. She did not have any afib while she was sick with covid. Continues on tikosyn  and eliquis .  F/u in afib clinic, 08/27/19, pt called to office saying that she is seeing HR's by BP cuff in the 40's at home even though she feels well. We ran a Link report that shows PAC's as well as episodes of afib that usually  last around mins to one hour. Pt does not feel these episodes. Her longest afib episode was 50 mins on 2/26 and 2/27 with v rates 120-150, again pt did not feel this. EKG today shows SR with pac's. She feels well today.  F/u in afib clinic, 08/27/20. Afib has been quiet recently. She saw Dr. Kelsie 06/01/21 and by his assessment then, he did not feel she was a repeat ablation candidate. She continues on tikosyn . No  bleeding issues with eliquis .  F/u in afib clinic, 03/03/21. She remains on Tikosyn . One episode of afib that lasted around 24 hours and resolved on its own. She has an apple watch now so we discussed how she can track this as well as with her Linq. Last Linq report in September did not show any arrhythmia. Continues on eliquis  5 mg bid.  F/u in afib clinic, 10/13/21  for Tikosyn  surveillance. Qt is stable. Only one episode of Afib lasting less than 8 hours. Remain complaint on Tikosyn .  She was dx with  early L breast CA in February, treated with lumpectomy and  will be finishing radiation soon. Has had a congested sough x 2 weeks. PCP has evaluated.   F/u in afib clinic, 12/28/21. She developed afib on Saturday. She has been complaint with Tikosyn  and anticoagulation. No known triggers. She had her Linq explanted in May. She was in her garage this am and became lightheaded for a few seconds. Afther that she felt like she was back in SR. EKG today confirms this. She has some stress recently which may have contributed to the afib.   F/u in afib clinic 10/10 for Tikosyn  surveillance but also reports that she has a syncopal episode this past Thursday night after drinking around 3/4 glass of wine. She may not have eaten that much  that day. Her husband saw her fall, she was out for less than one minute.she was out of rhythm when she regained consciousness.  She states she did not hit her head. She did not seek any type of medial attention.  She is also describing episodes of feeling dizzy  for just a few seconds at a time.no further syncope. While running ekg today,she went from sinus brady at 57 bpm to accelerated  junctional at 122 bpm for just a few minutes. With the faster rate she felt dizzy.    F/u in afib clinic, 08/11/22. She was notified that she was back in afib by device clinic on Monday with increase afib burden and ongoing afib. EKG today shows afib at 75 bpm. She was not aware that she was in afib.  She just got back from  [place]  and even being  on vacation, it was stressful. They have recently made a move to [place]  and she is not happy there. She was drinking around 1 1/2 glasses of wine a night and admitted to high salt intake.   Follow up in the AF clinic 08/31/22. Patient noted to be in persistent atrial fibrillation by the device clinic. Patient reports she has felt well and has not been aware of her arrhythmia. She might have more fatigue with exertion but is not sure about this. No bleeding issues on anticoagulation.   Follow up in the AF clinic 03/14/23. Patient reports that she has done well since her ablation. She has had some afib noted on her Apple Watch but most strips show SR with PACs or PVCs. She does feel improved overall. She denies any chest pain, swallowing pain, or groin issues.   Follow up 02/14/24. Patient returns for follow up for atrial fibrillation and dofetilide  monitoring. She feels well from a cardiac standpoint. She has been recently diagnosed with bronchitis and is finishing a course of antibiotics. No bleeding issues on anticoagulation. PPM shows 12% afib burden.   Today, she  denies symptoms of palpitations, chest pain, shortness of breath, orthopnea, PND, lower extremity edema, dizziness, presyncope, syncope, snoring, daytime somnolence, bleeding, or neurologic sequela. The patient is tolerating medications without difficulties and is otherwise without complaint today.    Past Medical History:  Diagnosis Date   Anxiety    Atrial fibrillation (HCC)    Bilateral lower extremity edema    Chronic, likely related to venous stasis    DJD (degenerative joint disease), lumbosacral    Dyslipidemia    Dysrhythmia    A fib   Essential hypertension    dx'd recently (10/06/2016)   GERD (gastroesophageal reflux disease)    History of hepatitis C virus infection ~ 1997   In remission   Lung granuloma (HCC) 2004   CT scan   Mitral regurgitation    04/30/21  echo: bileaflet prolapse with moderate MR   Osteopenia    Paroxysmal atrial flutter (HCC) 04/02/2022   Paroxysmal atrial tachycardia (HCC)    PE (pulmonary embolism) 01/2016   PONV (postoperative nausea and vomiting)    Patient states she had extremem nausea after neck surgery several years ago   Spondylolisthesis    Status post epidural injections 3 by neurosurgery  - Dr. Leeann    Varicose veins of both legs with edema    Vitamin D deficiency     Current Outpatient Medications  Medication Sig Dispense Refill   acetaminophen  (TYLENOL ) 325 MG tablet Take 2 tablets (650 mg total) by mouth every 4 (four)  hours as needed for headache or mild pain. (Patient taking differently: Take 650 mg by mouth as needed for headache or mild pain (pain score 1-3).)     amoxicillin-clavulanate (AUGMENTIN) 500-125 MG tablet Take 1 tablet by mouth 2 (two) times daily.     apixaban  (ELIQUIS ) 5 MG TABS tablet TAKE 1 TABLET(5 MG) BY MOUTH TWICE DAILY 60 tablet 5   betamethasone  valerate (VALISONE ) 0.1 % cream Apply 1 application topically 2 (two) times daily as needed (itching.).     Cholecalciferol (VITAMIN D) 125 MCG (5000 UT) CAPS Take 5,000 Units by mouth in the morning.     diltiazem  (CARDIZEM  CD) 120 MG 24 hr capsule TAKE 1 CAPSULE(120 MG) BY MOUTH DAILY 90 capsule 1   diltiazem  (CARDIZEM ) 30 MG tablet Take 1 tablet every 4 hours AS NEEDED for AFIB heart rate >100 as long as top BP >100. 30 tablet 1   dofetilide  (TIKOSYN ) 250 MCG capsule TAKE 1 CAPSULE BY MOUTH TWICE DAILY IN THE MORNING AND DINNER FOR HEART 180 capsule 2   furosemide  (LASIX ) 20 MG tablet Take 1 tablet (20 mg total) by mouth daily as needed for fluid or edema (weight gain of 3 lbs in 24 hrs or 5 lbs in 1 week). (Patient taking differently: Take 20 mg by mouth as needed for fluid or edema (weight gain of 3 lbs in 24 hrs or 5 lbs in 1 week).) 30 tablet 3   levocetirizine (XYZAL) 5 MG tablet Take 5 mg by mouth at bedtime.     losartan  (COZAAR )  25 MG tablet Take 25 mg by mouth in the morning.     Magnesium  250 MG TABS Take 250 mg by mouth at bedtime.     Multiple Vitamins-Minerals (MULTIVITAMIN WITH MINERALS) tablet Take 1 tablet by mouth daily.     Multiple Vitamins-Minerals (ZINC PO) Take 1 tablet by mouth daily.     pantoprazole  (PROTONIX ) 40 MG tablet Take 40 mg by mouth 2 (two) times daily.     potassium chloride  (KLOR-CON ) 10 MEQ tablet Take 1 tablet (10 mEq total) by mouth daily. 90 tablet 2   Probiotic Product (PROBIOTIC DAILY PO) Take 1 tablet by mouth every morning. May take 2 tablets as needed     propranolol  (INDERAL ) 20 MG tablet Take 1 tablet (20 mg total) by mouth 2 (two) times daily. 180 tablet 3   No current facility-administered medications for this encounter.    ROS- All systems are reviewed and negative except as per the HPI above  Physical Exam: Vitals:   02/14/24 0954  BP: 112/62  Pulse: 67  Weight: 69.6 kg  Height: 5' 7 (1.702 m)    Wt Readings from Last 3 Encounters:  02/14/24 69.6 kg  11/10/23 73.9 kg  05/24/23 71.2 kg    GEN: Well nourished, well developed in no acute distress CARDIAC: Regular rate and rhythm, no murmurs, rubs, gallops RESPIRATORY:  Clear to auscultation without rales, wheezing or rhonchi  ABDOMEN: Soft, non-tender, non-distended EXTREMITIES:  No edema; No deformity    EKG today demonstrates A paced rhythm Vent. rate 67 BPM PR interval 338 ms QRS duration 74 ms QT/QTcB 426/450 ms    CHA2DS2-VASc Score = 4  The patient's score is based upon: CHF History: 0 HTN History: 1 Diabetes History: 0 Stroke History: 0 Vascular Disease History: 0 Age Score: 2 Gender Score: 1       ASSESSMENT AND PLAN: Persistent Atrial Fibrillation (ICD10:  I48.19) The patient's CHA2DS2-VASc score is 4,  indicating a 4.8% annual risk of stroke.   S/p afib ablation 2018 and 02/09/23 Patient in SR today. Last remote transmission showed 12% afib burden. Continue dofetilide  250 mcg  BID Continue Eliquis  5 mg BID Continue diltiazem  120 mg daily with 30 mg PRN q 4 hours for heart racing.  Secondary Hypercoagulable State (ICD10:  D68.69) The patient is at significant risk for stroke/thromboembolism based upon her CHA2DS2-VASc Score of 4.  Continue Apixaban  (Eliquis ). No bleeding issues.   High Risk Medication Monitoring (ICD 10: J342684) Patient requires ongoing monitoring for anti-arrhythmic medication which has the potential to cause life threatening arrhythmias. QT interval on ECG acceptable for dofetilide  monitoring. Check bmet/mag today.     HTN Stable today, patient currently holding losartan  due to low BP at home. She will continue to monitor.   Tachybradycardia syndrome S/p PPM, followed by Dr Cindie   Follow up with EP APP in 6 months. AF clinic in one year.    Daril Kicks PA-C Afib Clinic [facility] [address] [place], [place] [address] [phone]

## 2024-02-14 NOTE — Patient Instructions (Signed)
 Follow up with Dr Ramona office in 6 months  Follow up with Daril Kicks in 1 year

## 2024-02-15 ENCOUNTER — Ambulatory Visit (HOSPITAL_COMMUNITY): Payer: Self-pay | Admitting: Physician Assistant

## 2024-02-15 LAB — BASIC METABOLIC PANEL WITH GFR
BUN/Creatinine Ratio: 28 (ref 12–28)
BUN: 29 mg/dL — ABNORMAL HIGH (ref 8–27)
CO2: 20 mmol/L (ref 20–29)
Calcium: 9.7 mg/dL (ref 8.7–10.3)
Chloride: 102 mmol/L (ref 96–106)
Creatinine, Ser: 1.02 mg/dL — ABNORMAL HIGH (ref 0.57–1.00)
Glucose: 101 mg/dL — ABNORMAL HIGH (ref 70–99)
Potassium: 5.2 mmol/L (ref 3.5–5.2)
Sodium: 141 mmol/L (ref 134–144)
eGFR: 54 mL/min/1.73 — ABNORMAL LOW (ref >59)

## 2024-02-15 LAB — MAGNESIUM: Magnesium: 2.1 mg/dL (ref 1.6–2.3)

## 2024-02-21 DIAGNOSIS — M7062 Trochanteric bursitis, left hip: Secondary | ICD-10-CM | POA: Diagnosis not present

## 2024-02-26 DIAGNOSIS — Z85828 Personal history of other malignant neoplasm of skin: Secondary | ICD-10-CM | POA: Diagnosis not present

## 2024-02-26 DIAGNOSIS — L905 Scar conditions and fibrosis of skin: Secondary | ICD-10-CM | POA: Diagnosis not present

## 2024-02-27 DIAGNOSIS — R399 Unspecified symptoms and signs involving the genitourinary system: Secondary | ICD-10-CM | POA: Diagnosis not present

## 2024-03-05 DIAGNOSIS — M7062 Trochanteric bursitis, left hip: Secondary | ICD-10-CM | POA: Diagnosis not present

## 2024-03-08 DIAGNOSIS — M7062 Trochanteric bursitis, left hip: Secondary | ICD-10-CM | POA: Diagnosis not present

## 2024-03-12 DIAGNOSIS — M7062 Trochanteric bursitis, left hip: Secondary | ICD-10-CM | POA: Diagnosis not present

## 2024-03-15 DIAGNOSIS — M7062 Trochanteric bursitis, left hip: Secondary | ICD-10-CM | POA: Diagnosis not present

## 2024-03-20 DIAGNOSIS — M7062 Trochanteric bursitis, left hip: Secondary | ICD-10-CM | POA: Diagnosis not present

## 2024-03-26 DIAGNOSIS — M7062 Trochanteric bursitis, left hip: Secondary | ICD-10-CM | POA: Diagnosis not present

## 2024-03-27 DIAGNOSIS — M7062 Trochanteric bursitis, left hip: Secondary | ICD-10-CM | POA: Diagnosis not present

## 2024-03-27 NOTE — Progress Notes (Signed)
 Remote PPM Transmission

## 2024-03-28 DIAGNOSIS — M7062 Trochanteric bursitis, left hip: Secondary | ICD-10-CM | POA: Diagnosis not present

## 2024-04-01 ENCOUNTER — Telehealth: Payer: Self-pay

## 2024-04-01 NOTE — Telephone Encounter (Signed)
 Call received from Pt.   She states she feels like she has been out of rhythm more often lately.  Heart rate gets up to 110-120's.    She states she regularly checks her BP and her systolic BP (off losartan ) is normally 120-130.  Attempted to get a transmission.  She has a scheduled one pending this evening.  Advised Pt would check for transmission first thing in the morning.  Advised in the interim it is OK for her to take her diltiazem  30 mg tablet for breakthrough Afib.  Pt indicates understanding.  Will follow up.

## 2024-04-02 ENCOUNTER — Ambulatory Visit

## 2024-04-02 DIAGNOSIS — I4819 Other persistent atrial fibrillation: Secondary | ICD-10-CM | POA: Diagnosis not present

## 2024-04-02 MED ORDER — DILTIAZEM HCL ER COATED BEADS 180 MG PO CP24: 180.0000 mg | ORAL_CAPSULE | Freq: Every day | ORAL | 3 refills | Status: DC

## 2024-04-02 NOTE — Telephone Encounter (Signed)
 Per Daril Kicks PA will attempt to increase cardizem  to 180mg  once a day and continue to be off losartan . Will follow burden on transmission if burden continues to be elevated will plan to see patient in office to discuss possible amiodarone transition. Pt verbalized agreement.

## 2024-04-02 NOTE — Telephone Encounter (Signed)
 Transmission received.  Presenting rhythm appears to be atrial tachycardia at 120 bpm.  There is potentially some atrial undersensing.  Her P waves are 0.2mV.  Her atrial sensitivity is programmed at 0.71mV (most sensitive).    Her current burden of AT/AF is 11.7%.   Her mode switch rate is 150 bpm-so potentially her burden is higher of atrial tachycardia.  Her ventricular histograms would suggest that most of her AT is in the 110-120 range.  Will forward message to AF clinic for further advisement.  It sounds like with her discontinuing her losartan  that she now has blood pressure room to titrate her other medicatons.

## 2024-04-03 LAB — CUP PACEART REMOTE DEVICE CHECK
Battery Remaining Longevity: 144 mo
Battery Voltage: 3.02 V
Brady Statistic AP VP Percent: 6.25 %
Brady Statistic AP VS Percent: 65.39 %
Brady Statistic AS VP Percent: 3.7 %
Brady Statistic AS VS Percent: 24.67 %
Brady Statistic RA Percent Paced: 65.52 %
Brady Statistic RV Percent Paced: 11.11 %
Date Time Interrogation Session: 20251013170525
Implantable Lead Connection Status: 753985
Implantable Lead Connection Status: 753985
Implantable Lead Implant Date: 20231013
Implantable Lead Implant Date: 20231013
Implantable Lead Location: 753859
Implantable Lead Location: 753860
Implantable Lead Model: 3830
Implantable Lead Model: 5076
Implantable Pulse Generator Implant Date: 20231013
Lead Channel Impedance Value: 361 Ohm
Lead Channel Impedance Value: 380 Ohm
Lead Channel Impedance Value: 437 Ohm
Lead Channel Impedance Value: 532 Ohm
Lead Channel Pacing Threshold Amplitude: 0.5 V
Lead Channel Pacing Threshold Amplitude: 0.875 V
Lead Channel Pacing Threshold Pulse Width: 0.4 ms
Lead Channel Pacing Threshold Pulse Width: 0.4 ms
Lead Channel Sensing Intrinsic Amplitude: 0.5 mV
Lead Channel Sensing Intrinsic Amplitude: 0.5 mV
Lead Channel Sensing Intrinsic Amplitude: 3.625 mV
Lead Channel Sensing Intrinsic Amplitude: 3.625 mV
Lead Channel Setting Pacing Amplitude: 1.5 V
Lead Channel Setting Pacing Amplitude: 2 V
Lead Channel Setting Pacing Pulse Width: 0.4 ms
Lead Channel Setting Sensing Sensitivity: 1.2 mV
Zone Setting Status: 755011

## 2024-04-04 ENCOUNTER — Ambulatory Visit: Payer: Self-pay | Admitting: Cardiology

## 2024-04-05 NOTE — Progress Notes (Signed)
 Remote PPM Transmission

## 2024-05-06 ENCOUNTER — Other Ambulatory Visit: Payer: Self-pay | Admitting: Cardiology

## 2024-05-07 MED ORDER — DOFETILIDE 250 MCG PO CAPS: ORAL_CAPSULE | ORAL | 0 refills | Status: AC

## 2024-05-21 DIAGNOSIS — I5030 Unspecified diastolic (congestive) heart failure: Secondary | ICD-10-CM | POA: Diagnosis not present

## 2024-05-21 DIAGNOSIS — I4821 Permanent atrial fibrillation: Secondary | ICD-10-CM | POA: Diagnosis not present

## 2024-05-21 DIAGNOSIS — Z950 Presence of cardiac pacemaker: Secondary | ICD-10-CM | POA: Diagnosis not present

## 2024-05-21 DIAGNOSIS — Z86711 Personal history of pulmonary embolism: Secondary | ICD-10-CM | POA: Diagnosis not present

## 2024-05-21 DIAGNOSIS — B1920 Unspecified viral hepatitis C without hepatic coma: Secondary | ICD-10-CM | POA: Diagnosis not present

## 2024-05-21 DIAGNOSIS — D6869 Other thrombophilia: Secondary | ICD-10-CM | POA: Diagnosis not present

## 2024-05-21 DIAGNOSIS — N1831 Chronic kidney disease, stage 3a: Secondary | ICD-10-CM | POA: Diagnosis not present

## 2024-05-21 DIAGNOSIS — K224 Dyskinesia of esophagus: Secondary | ICD-10-CM | POA: Diagnosis not present

## 2024-05-21 DIAGNOSIS — Z853 Personal history of malignant neoplasm of breast: Secondary | ICD-10-CM | POA: Diagnosis not present

## 2024-05-21 DIAGNOSIS — I10 Essential (primary) hypertension: Secondary | ICD-10-CM | POA: Diagnosis not present

## 2024-06-26 ENCOUNTER — Other Ambulatory Visit: Payer: Self-pay | Admitting: Cardiology

## 2024-06-26 DIAGNOSIS — I4719 Other supraventricular tachycardia: Secondary | ICD-10-CM

## 2024-07-02 ENCOUNTER — Ambulatory Visit

## 2024-07-02 DIAGNOSIS — I4819 Other persistent atrial fibrillation: Secondary | ICD-10-CM

## 2024-07-03 LAB — CUP PACEART REMOTE DEVICE CHECK
Battery Remaining Longevity: 140 mo
Battery Voltage: 3.02 V
Brady Statistic AP VP Percent: 10.83 %
Brady Statistic AP VS Percent: 57.81 %
Brady Statistic AS VP Percent: 20.79 %
Brady Statistic AS VS Percent: 10.57 %
Brady Statistic RA Percent Paced: 64.64 %
Brady Statistic RV Percent Paced: 32.37 %
Date Time Interrogation Session: 20260113215243
Implantable Lead Connection Status: 753985
Implantable Lead Connection Status: 753985
Implantable Lead Implant Date: 20231013
Implantable Lead Implant Date: 20231013
Implantable Lead Location: 753859
Implantable Lead Location: 753860
Implantable Lead Model: 3830
Implantable Lead Model: 5076
Implantable Pulse Generator Implant Date: 20231013
Lead Channel Impedance Value: 323 Ohm
Lead Channel Impedance Value: 380 Ohm
Lead Channel Impedance Value: 418 Ohm
Lead Channel Impedance Value: 551 Ohm
Lead Channel Pacing Threshold Amplitude: 0.5 V
Lead Channel Pacing Threshold Amplitude: 0.75 V
Lead Channel Pacing Threshold Pulse Width: 0.4 ms
Lead Channel Pacing Threshold Pulse Width: 0.4 ms
Lead Channel Sensing Intrinsic Amplitude: 0.75 mV
Lead Channel Sensing Intrinsic Amplitude: 0.75 mV
Lead Channel Sensing Intrinsic Amplitude: 4.25 mV
Lead Channel Sensing Intrinsic Amplitude: 4.25 mV
Lead Channel Setting Pacing Amplitude: 1.5 V
Lead Channel Setting Pacing Amplitude: 2 V
Lead Channel Setting Pacing Pulse Width: 0.4 ms
Lead Channel Setting Sensing Sensitivity: 1.2 mV
Zone Setting Status: 755011

## 2024-07-05 ENCOUNTER — Ambulatory Visit (HOSPITAL_COMMUNITY)
Admission: RE | Admit: 2024-07-05 | Discharge: 2024-07-05 | Disposition: A | Discharge status: Home / Self Care | Source: Ambulatory Visit | Attending: Physician Assistant | Admitting: Physician Assistant

## 2024-07-05 VITALS — BP 120/88 | HR 119 | Ht 67.0 in | Wt 150.3 lb

## 2024-07-05 DIAGNOSIS — Z5181 Encounter for therapeutic drug level monitoring: Secondary | ICD-10-CM

## 2024-07-05 DIAGNOSIS — I484 Atypical atrial flutter: Secondary | ICD-10-CM | POA: Diagnosis not present

## 2024-07-05 DIAGNOSIS — D6869 Other thrombophilia: Secondary | ICD-10-CM

## 2024-07-05 DIAGNOSIS — I4819 Other persistent atrial fibrillation: Secondary | ICD-10-CM | POA: Diagnosis not present

## 2024-07-05 DIAGNOSIS — Z79899 Other long term (current) drug therapy: Secondary | ICD-10-CM | POA: Diagnosis not present

## 2024-07-05 DIAGNOSIS — I4891 Unspecified atrial fibrillation: Secondary | ICD-10-CM

## 2024-07-05 MED ORDER — DILTIAZEM HCL ER COATED BEADS 240 MG PO CP24: 240.0000 mg | ORAL_CAPSULE | Freq: Every day | ORAL | 3 refills | Status: AC

## 2024-07-05 NOTE — Progress Notes (Signed)
 "  Primary Care Physician: Ransom Other, MD Referring Physician:  device clinic EP: Dr. Cindie Ronal [name] is a 84 y.o. female with a h/o atrial fib and PE/DVT 01/2016. She is has been treated with Rythmol and Tikosyn  in the past. When her burden increased on Tikosyn , it was decided to pursue ablation 02/26/17.   She is in the afib clinic today, 04/10/19, for f/u of Tikosyn  past ablation. She feels good. She has been staying in SR. She has a linq, has intermittent palpitations but per Dr. Merrick note disorganized atrial activity, but not afib. She is leaving for [place]  in Feb/March,2021 for a couple of months. Continues on eliquis  5 mg bid for a CHA2DS2VASc score of 4, no bleeding issues.  F/u in afib clinic, 07/18/19. She reports that she had Covid 19 the earlier part of the month while she and her husband were at the beach. They believe the exposure was from  2 furniture  delivery men  that were in their house for awhile, as they did not come into contact with other people and did not eat in restaurants.  She  and her husband both came down with it and received the antibody infusion at [facility] hospital. She overall felt better after that and she feels their symptoms were fairly mild. She did not have any afib while she was sick with covid. Continues on tikosyn  and eliquis .  F/u in afib clinic, 08/27/19, pt called to office saying that she is seeing HR's by BP cuff in the 40's at home even though she feels well. We ran a Link report that shows PAC's as well as episodes of afib that usually  last around mins to one hour. Pt does not feel these episodes. Her longest afib episode was 50 mins on 2/26 and 2/27 with v rates 120-150, again pt did not feel this. EKG today shows SR with pac's. She feels well today.  F/u in afib clinic, 08/27/20. Afib has been quiet recently. She saw Dr. Kelsie 06/01/21 and by his assessment then, he did not feel she was a repeat ablation candidate. She continues on tikosyn . No  bleeding issues with eliquis .  F/u in afib clinic, 03/03/21. She remains on Tikosyn . One episode of afib that lasted around 24 hours and resolved on its own. She has an apple watch now so we discussed how she can track this as well as with her Linq. Last Linq report in September did not show any arrhythmia. Continues on eliquis  5 mg bid.  F/u in afib clinic, 10/13/21  for Tikosyn  surveillance. Qt is stable. Only one episode of Afib lasting less than 8 hours. Remain complaint on Tikosyn .  She was dx with  early L breast CA in February, treated with lumpectomy and  will be finishing radiation soon. Has had a congested sough x 2 weeks. PCP has evaluated.   F/u in afib clinic, 12/28/21. She developed afib on Saturday. She has been complaint with Tikosyn  and anticoagulation. No known triggers. She had her Linq explanted in May. She was in her garage this am and became lightheaded for a few seconds. Afther that she felt like she was back in SR. EKG today confirms this. She has some stress recently which may have contributed to the afib.   F/u in afib clinic 10/10 for Tikosyn  surveillance but also reports that she has a syncopal episode this past Thursday night after drinking around 3/4 glass of wine. She may not have eaten that  much that day. Her husband saw her fall, she was out for less than one minute.she was out of rhythm when she regained consciousness.  She states she did not hit her head. She did not seek any type of medial attention.  She is also describing episodes of feeling dizzy  for just a few seconds at a time.no further syncope. While running ekg today,she went from sinus brady at 57 bpm to accelerated  junctional at 122 bpm for just a few minutes. With the faster rate she felt dizzy.    F/u in afib clinic, 08/11/22. She was notified that she was back in afib by device clinic on Monday with increase afib burden and ongoing afib. EKG today shows afib at 75 bpm. She was not aware that she was in afib.  She just got back from  [place]  and even being  on vacation, it was stressful. They have recently made a move to [place]  and she is not happy there. She was drinking around 1 1/2 glasses of wine a night and admitted to high salt intake.   Follow up in the AF clinic 08/31/22. Patient noted to be in persistent atrial fibrillation by the device clinic. Patient reports she has felt well and has not been aware of her arrhythmia. She might have more fatigue with exertion but is not sure about this. No bleeding issues on anticoagulation.   Follow up in the AF clinic 03/14/23. Patient reports that she has done well since her ablation. She has had some afib noted on her Apple Watch but most strips show SR with PACs or PVCs. She does feel improved overall. She denies any chest pain, swallowing pain, or groin issues.   Follow up 02/14/24. Patient returns for follow up for atrial fibrillation and dofetilide  monitoring. She feels well from a cardiac standpoint. She has been recently diagnosed with bronchitis and is finishing a course of antibiotics. No bleeding issues on anticoagulation. PPM shows 12% afib burden.   Follow up 07/05/24. Patient returns for follow up for atrial fibrillation and dofetilide  monitoring. She reports having more symptoms recently. Her burden on device is 10% which is close to her baseline. She appears to be in atrial flutter today. No bleeding issues on anticoagulation.   Today, she  denies symptoms of chest pain, shortness of breath, orthopnea, PND, lower extremity edema, dizziness, presyncope, syncope, snoring, daytime somnolence, bleeding, or neurologic sequela. The patient is tolerating medications without difficulties and is otherwise without complaint today.    Past Medical History:  Diagnosis Date   Anxiety    Atrial fibrillation (HCC)    Bilateral lower extremity edema    Chronic, likely related to venous stasis    DJD (degenerative joint disease), lumbosacral     Dyslipidemia    Dysrhythmia    A fib   Essential hypertension    dx'd recently (10/06/2016)   GERD (gastroesophageal reflux disease)    History of hepatitis C virus infection ~ 1997   In remission   Lung granuloma (HCC) 2004   CT scan   Mitral regurgitation    04/30/21 echo: bileaflet prolapse with moderate MR   Osteopenia    Paroxysmal atrial flutter (HCC) 04/02/2022   Paroxysmal atrial tachycardia    PE (pulmonary embolism) 01/2016   PONV (postoperative nausea and vomiting)    Patient states she had extremem nausea after neck surgery several years ago   Spondylolisthesis    Status post epidural injections 3 by neurosurgery  - Dr. Leeann  Varicose veins of both legs with edema    Vitamin D deficiency     Current Outpatient Medications  Medication Sig Dispense Refill   acetaminophen  (TYLENOL ) 325 MG tablet Take 2 tablets (650 mg total) by mouth every 4 (four) hours as needed for headache or mild pain. (Patient taking differently: Take 650 mg by mouth as needed for headache or mild pain (pain score 1-3).)     betamethasone  valerate (VALISONE ) 0.1 % cream Apply 1 application topically 2 (two) times daily as needed (itching.).     Cholecalciferol (VITAMIN D) 125 MCG (5000 UT) CAPS Take 5,000 Units by mouth in the morning.     diltiazem  (CARDIZEM ) 30 MG tablet Take 1 tablet every 4 hours AS NEEDED for AFIB heart rate >100 as long as top BP >100. 30 tablet 1   dofetilide  (TIKOSYN ) 250 MCG capsule TAKE 1 CAPSULE BY MOUTH TWICE DAILY IN THE MORNING AND DINNER FOR HEART 180 capsule 0   ELIQUIS  5 MG TABS tablet TAKE 1 TABLET(5 MG) BY MOUTH TWICE DAILY 60 tablet 5   furosemide  (LASIX ) 20 MG tablet Take 1 tablet (20 mg total) by mouth daily as needed for fluid or edema (weight gain of 3 lbs in 24 hrs or 5 lbs in 1 week). (Patient taking differently: Take 20 mg by mouth as needed for fluid or edema (weight gain of 3 lbs in 24 hrs or 5 lbs in 1 week).) 30 tablet 3   guaiFENesin (MUCINEX) 600  MG 12 hr tablet Take 600 mg by mouth as needed.     Magnesium  250 MG TABS Take 250 mg by mouth at bedtime.     Multiple Vitamins-Minerals (ZINC PO) Take 1 tablet by mouth daily.     pantoprazole  (PROTONIX ) 40 MG tablet Take 40 mg by mouth 2 (two) times daily.     potassium chloride  (KLOR-CON ) 10 MEQ tablet Take 1 tablet (10 mEq total) by mouth daily. 90 tablet 2   Probiotic Product (PROBIOTIC DAILY PO) Take 1 tablet by mouth every morning. May take 2 tablets as needed     propranolol  (INDERAL ) 20 MG tablet Take 1 tablet (20 mg total) by mouth 2 (two) times daily. 180 tablet 3   diltiazem  (CARDIZEM  CD) 240 MG 24 hr capsule Take 1 capsule (240 mg total) by mouth at bedtime. 30 capsule 3   No current facility-administered medications for this encounter.    ROS- All systems are reviewed and negative except as per the HPI above  Physical Exam: Vitals:   07/05/24 0900  BP: 120/88  Pulse: (!) 119  Weight: 68.2 kg  Height: 5' 7 (1.702 m)    Wt Readings from Last 3 Encounters:  07/05/24 68.2 kg  02/14/24 69.6 kg  11/10/23 73.9 kg    GEN: Well nourished, well developed in no acute distress CARDIAC: Regular rate and rhythm, no murmurs, rubs, gallops RESPIRATORY:  Clear to auscultation without rales, wheezing or rhonchi  ABDOMEN: Soft, non-tender, non-distended EXTREMITIES:  No edema; No deformity    EKG Interpretation Date/Time:  Friday July 05 2024 09:09:36 EST Ventricular Rate:  119 PR Interval:    QRS Duration:  132 QT Interval:  404 QTC Calculation: 568 R Axis:   140  Text Interpretation: Ventricular-paced rhythm with underlying atrial flutter Abnormal ECG When compared with ECG of 14-Feb-2024 10:05, Electronic ventricular pacemaker has replaced Atrial-paced rhythm Confirmed by Oswald Pott ([id number]) on 07/05/2024 9:41:39 AM     CHA2DS2-VASc Score = 4  The patient's score  is based upon: CHF History: 0 HTN History: 1 Diabetes History: 0 Stroke History: 0 Vascular Disease  History: 0 Age Score: 2 Gender Score: 1       ASSESSMENT AND PLAN: Persistent Atrial Fibrillation/atrial flutter (ICD10:  I48.19) The patient's CHA2DS2-VASc score is 4, indicating a 4.8% annual risk of stroke.   S/p afib ablation 2018 and 02/09/23 Burden on her device is ~10% but she reports having more symptoms recently. Will increase diltiazem  to 240 mg daily. Unsure if she would be a candidate for repeat ablation since her previous ablations were RF. She has a visit with Dr Inocencio next month already to establish care. If she is not an ablation candidate, could consider transitioning to amiodarone.  Continue dofetilide  250 mcg BID Continue Eliquis  5 mg BID Continue diltiazem  30 mg PRN q 4 hours for heart racing.   Secondary Hypercoagulable State (ICD10:  D68.69) The patient is at significant risk for stroke/thromboembolism based upon her CHA2DS2-VASc Score of 4.  Continue Apixaban  (Eliquis ). No bleeding issues.  High Risk Medication Monitoring (ICD 10: [id number]) Patient requires ongoing monitoring for anti-arrhythmic medication which has the potential to cause life threatening arrhythmias. QT interval on ECG acceptable for dofetilide  monitoring.    HTN Stable on current regimen  Tachybradycardia syndrome S/p PPM, establishing care with Dr Inocencio.   Follow up with Dr Inocencio as scheduled.    Daril Kicks PA-C Afib [facility] [address] [place], [place] [address] [phone] "

## 2024-07-05 NOTE — Progress Notes (Signed)
 Remote PPM Transmission

## 2024-07-05 NOTE — Patient Instructions (Signed)
Increase cardizem to 240mg  once a day

## 2024-07-07 ENCOUNTER — Ambulatory Visit: Payer: Self-pay | Admitting: Cardiology

## 2024-08-16 ENCOUNTER — Ambulatory Visit: Admitting: Cardiology

## 2024-10-01 ENCOUNTER — Ambulatory Visit

## 2024-11-12 ENCOUNTER — Other Ambulatory Visit

## 2024-11-12 ENCOUNTER — Ambulatory Visit: Admitting: Hematology

## 2024-12-31 ENCOUNTER — Ambulatory Visit

## 2025-04-01 ENCOUNTER — Ambulatory Visit
